# Patient Record
Sex: Female | Born: 1940 | Race: Black or African American | Hispanic: No | Smoking: Former smoker
Health system: Southern US, Community
[De-identification: ages and names within clinical notes are randomized; demographics above are authoritative.]

## PROBLEM LIST (undated history)

## (undated) DIAGNOSIS — E119 Type 2 diabetes mellitus without complications: Secondary | ICD-10-CM

## (undated) DIAGNOSIS — I10 Essential (primary) hypertension: Secondary | ICD-10-CM

## (undated) DIAGNOSIS — Z5189 Encounter for other specified aftercare: Secondary | ICD-10-CM

## (undated) DIAGNOSIS — D649 Anemia, unspecified: Secondary | ICD-10-CM

## (undated) DIAGNOSIS — E785 Hyperlipidemia, unspecified: Secondary | ICD-10-CM

## (undated) DIAGNOSIS — Z972 Presence of dental prosthetic device (complete) (partial): Secondary | ICD-10-CM

## (undated) DIAGNOSIS — M109 Gout, unspecified: Secondary | ICD-10-CM

## (undated) HISTORY — PX: TONSILLECTOMY: SUR1361

## (undated) HISTORY — DX: Hyperlipidemia, unspecified: E78.5

## (undated) HISTORY — PX: APPENDECTOMY: SHX54

## (undated) MED FILL — Fosaprepitant Dimeglumine For IV Infusion 150 MG (Base Eq): INTRAVENOUS | Qty: 5 | Status: AC

---

## 2004-02-14 ENCOUNTER — Emergency Department: Payer: Self-pay | Admitting: Emergency Medicine

## 2004-02-19 ENCOUNTER — Ambulatory Visit: Payer: Self-pay | Admitting: Internal Medicine

## 2004-12-14 ENCOUNTER — Ambulatory Visit: Payer: Self-pay | Admitting: Family Medicine

## 2006-10-09 ENCOUNTER — Ambulatory Visit: Payer: Self-pay | Admitting: Internal Medicine

## 2008-11-07 ENCOUNTER — Ambulatory Visit: Payer: Self-pay | Admitting: Family Medicine

## 2008-11-27 ENCOUNTER — Ambulatory Visit: Payer: Self-pay | Admitting: Internal Medicine

## 2008-11-27 ENCOUNTER — Inpatient Hospital Stay: Payer: Self-pay | Admitting: Internal Medicine

## 2009-06-12 HISTORY — PX: OTHER SURGICAL HISTORY: SHX169

## 2009-06-19 ENCOUNTER — Emergency Department: Payer: Self-pay | Admitting: Emergency Medicine

## 2009-06-20 ENCOUNTER — Inpatient Hospital Stay: Payer: Self-pay | Admitting: Surgery

## 2009-11-10 ENCOUNTER — Ambulatory Visit: Payer: Self-pay | Admitting: Family Medicine

## 2010-08-26 ENCOUNTER — Ambulatory Visit: Payer: Self-pay | Admitting: Ophthalmology

## 2010-11-19 ENCOUNTER — Ambulatory Visit: Payer: Self-pay | Admitting: Family Medicine

## 2010-12-19 ENCOUNTER — Emergency Department: Payer: Self-pay | Admitting: Emergency Medicine

## 2011-02-05 ENCOUNTER — Ambulatory Visit: Payer: Self-pay

## 2011-05-05 ENCOUNTER — Ambulatory Visit: Payer: Self-pay | Admitting: Ophthalmology

## 2011-11-23 ENCOUNTER — Ambulatory Visit: Payer: Self-pay | Admitting: Family Medicine

## 2012-11-23 ENCOUNTER — Ambulatory Visit: Payer: Self-pay | Admitting: Family Medicine

## 2013-09-23 ENCOUNTER — Emergency Department: Payer: Self-pay | Admitting: Internal Medicine

## 2013-12-03 ENCOUNTER — Ambulatory Visit: Payer: Self-pay | Admitting: Family Medicine

## 2013-12-03 LAB — LIPID PANEL
Cholesterol: 154 mg/dL (ref 0–200)
HDL: 51 mg/dL (ref 35–70)
LDL Cholesterol: 85 mg/dL
Triglycerides: 88 mg/dL (ref 40–160)

## 2013-12-03 LAB — HM MAMMOGRAPHY: HM Mammogram: NORMAL

## 2014-04-03 DIAGNOSIS — Z021 Encounter for pre-employment examination: Secondary | ICD-10-CM | POA: Diagnosis not present

## 2014-04-03 DIAGNOSIS — E119 Type 2 diabetes mellitus without complications: Secondary | ICD-10-CM | POA: Diagnosis not present

## 2014-04-03 DIAGNOSIS — I10 Essential (primary) hypertension: Secondary | ICD-10-CM | POA: Diagnosis not present

## 2014-04-03 LAB — BASIC METABOLIC PANEL
BUN: 11 mg/dL (ref 4–21)
Creatinine: 0.9 mg/dL (ref <=1.1)
Glucose: 256 mg/dL

## 2014-05-28 DIAGNOSIS — E119 Type 2 diabetes mellitus without complications: Secondary | ICD-10-CM | POA: Diagnosis not present

## 2014-05-28 LAB — HEMOGLOBIN A1C: Hgb A1c MFr Bld: 8.6 % — AB (ref 4.0–6.0)

## 2014-06-20 DIAGNOSIS — E1169 Type 2 diabetes mellitus with other specified complication: Secondary | ICD-10-CM | POA: Insufficient documentation

## 2014-06-20 DIAGNOSIS — E669 Obesity, unspecified: Secondary | ICD-10-CM | POA: Insufficient documentation

## 2014-06-20 DIAGNOSIS — E785 Hyperlipidemia, unspecified: Secondary | ICD-10-CM | POA: Insufficient documentation

## 2014-06-20 DIAGNOSIS — Z025 Encounter for examination for participation in sport: Secondary | ICD-10-CM | POA: Insufficient documentation

## 2014-06-20 DIAGNOSIS — Z0289 Encounter for other administrative examinations: Secondary | ICD-10-CM | POA: Insufficient documentation

## 2014-06-20 DIAGNOSIS — E119 Type 2 diabetes mellitus without complications: Secondary | ICD-10-CM | POA: Insufficient documentation

## 2014-06-20 DIAGNOSIS — E7849 Other hyperlipidemia: Secondary | ICD-10-CM | POA: Insufficient documentation

## 2014-06-20 DIAGNOSIS — I10 Essential (primary) hypertension: Secondary | ICD-10-CM | POA: Insufficient documentation

## 2014-07-19 ENCOUNTER — Emergency Department
Admission: EM | Admit: 2014-07-19 | Discharge: 2014-07-19 | Disposition: A | Discharge status: Home / Self Care | Payer: Commercial Managed Care - HMO | Attending: Emergency Medicine | Admitting: Emergency Medicine

## 2014-07-19 ENCOUNTER — Emergency Department: Payer: Commercial Managed Care - HMO

## 2014-07-19 ENCOUNTER — Encounter: Payer: Self-pay | Admitting: Emergency Medicine

## 2014-07-19 DIAGNOSIS — M1712 Unilateral primary osteoarthritis, left knee: Secondary | ICD-10-CM | POA: Insufficient documentation

## 2014-07-19 DIAGNOSIS — Z79899 Other long term (current) drug therapy: Secondary | ICD-10-CM | POA: Insufficient documentation

## 2014-07-19 DIAGNOSIS — Z87891 Personal history of nicotine dependence: Secondary | ICD-10-CM | POA: Insufficient documentation

## 2014-07-19 DIAGNOSIS — E119 Type 2 diabetes mellitus without complications: Secondary | ICD-10-CM | POA: Insufficient documentation

## 2014-07-19 DIAGNOSIS — I10 Essential (primary) hypertension: Secondary | ICD-10-CM | POA: Diagnosis not present

## 2014-07-19 DIAGNOSIS — M25562 Pain in left knee: Secondary | ICD-10-CM | POA: Diagnosis present

## 2014-07-19 HISTORY — DX: Type 2 diabetes mellitus without complications: E11.9

## 2014-07-19 HISTORY — DX: Essential (primary) hypertension: I10

## 2014-07-19 MED ORDER — KETOROLAC TROMETHAMINE 30 MG/ML IJ SOLN
30.0000 mg | Freq: Once | INTRAMUSCULAR | Status: AC
  Administered 2014-07-19: 30 mg via INTRAMUSCULAR

## 2014-07-19 MED ORDER — KETOROLAC TROMETHAMINE 30 MG/ML IJ SOLN
INTRAMUSCULAR | Status: AC
  Administered 2014-07-19: 30 mg via INTRAMUSCULAR
  Filled 2014-07-19: qty 1

## 2014-07-19 MED ORDER — NAPROXEN SODIUM 550 MG PO TABS: 550.0000 mg | ORAL_TABLET | Freq: Two times a day (BID) | ORAL | Status: DC

## 2014-07-19 NOTE — Discharge Instructions (Signed)

## 2014-07-19 NOTE — ED Notes (Signed)
Pt alert and oriented X4, active, cooperative, pt in NAD. RR even and unlabored, color WNL.  Pt informed to return if any life threatening symptoms occur.   

## 2014-07-19 NOTE — ED Notes (Addendum)
Pts airway intact , color wnl and appears in no distress. Pt states that she has had chronic left  knee pain and it comes and goes. She said that she did not hit the knee anywhere. Upon inspection the left knee looks slightly swollen. There is some pain on palpation ,  states that it feel like it is sore. Range of motion slightly compromised and there is pain on flexion. Able to bear weight with some pain.

## 2014-07-19 NOTE — ED Notes (Signed)
Patient transported to X-ray 

## 2014-07-19 NOTE — ED Provider Notes (Signed)
[facility] Emergency Department Provider Note  ____________________________________________  Time seen: Approximately 8:08 AM  I have reviewed the triage vital signs and the nursing notes.   HISTORY  Chief Complaint Knee Pain   HPI April [name] is a 74 y.o. female who presents to the emergency room for evaluation of left knee pain. Patient states the pain is progressively getting worse over the past 30 days. She denies any known trauma at this time. She states she is able to ambulate but with difficulty.   Past Medical History  Diagnosis Date  . Hypertension   . Diabetes mellitus without complication     Patient Active Problem List   Diagnosis Date Noted  . Diabetes mellitus with no complication [id number]  . 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 Essential (primary) hypertension 06/20/2014  . Routine sports examination 06/20/2014  . Health examination of defined subpopulation 06/20/2014    Past Surgical History  Procedure Laterality Date  . Gallstones  06/12/2009  . Appendectomy      Current Outpatient Rx  Name  Route  Sig  Dispense  Refill  . aspirin 81 MG tablet   Oral   Take 1 tablet by mouth daily.         Marland Kitchen diltiazem (DILACOR XR) 240 MG 24 hr capsule   Oral   Take 1 capsule by mouth daily.         Marland Kitchen glipiZIDE (GLUCOTROL) 5 MG tablet   Oral   Take 1 tablet by mouth 2 (two) times daily.         Marland Kitchen glucose blood test strip      1 strip daily.         Marland Kitchen lisinopril-hydrochlorothiazide (PRINZIDE,ZESTORETIC) 20-25 MG per tablet   Oral   Take 1 tablet by mouth daily.         . metFORMIN (GLUCOPHAGE) 500 MG tablet   Oral   Take 1 tablet by mouth 2 (two) times daily.         . naproxen sodium (ANAPROX) 550 MG tablet   Oral   Take 1 tablet (550 mg total) by mouth 2 (two) times daily with a meal.   60 tablet   0   . simvastatin (ZOCOR) 20 MG tablet   Oral    Take 1 tablet by mouth daily.           Allergies Review of patient's allergies indicates no known allergies.  History reviewed. No pertinent family history.  Social History History  Substance Use Topics  . Smoking status: Former Research scientist (life sciences)  . Smokeless tobacco: Not on file  . Alcohol Use: No    Review of Systems Constitutional: No fever/chills Eyes: No visual changes. ENT: No sore throat. Cardiovascular: Denies chest pain. Respiratory: Denies shortness of breath. Gastrointestinal: No abdominal pain.  No nausea, no vomiting.  No diarrhea.  No constipation. Genitourinary: Negative for dysuria. Musculoskeletal: Positive for right knee pain. Increased pain with flexion. Reflexes equal bilaterally lower extremities. Neurovascularly intact. Skin: Negative for rash. Neurological: Negative for headaches, focal weakness or numbness.  10-point ROS otherwise negative.  ____________________________________________   PHYSICAL EXAM:  VITAL SIGNS: ED Triage Vitals  Enc Vitals Group     BP 07/19/14 0756 147/58 mmHg     Pulse Rate 07/19/14 0756 67     Resp 07/19/14 0756 18     Temp 07/19/14 0756 98 F (36.7 C)     Temp Source 07/19/14 0756 Oral  SpO2 07/19/14 0756 97 %     Weight 07/19/14 0756 180 lb (81.647 kg)     Height 07/19/14 0756 5\' 6"  (1.676 m)     Head Cir --      Peak Flow --      Pain Score 07/19/14 0756 10     Pain Loc --      Pain Edu? --      Excl. in [place]? --     Constitutional: Alert and oriented. Well appearing and in no acute distress. Eyes: Conjunctivae are normal. PERRL. EOMI. Head: Atraumatic. Nose: No congestion/rhinnorhea. Mouth/Throat: Mucous membranes are moist.  Oropharynx non-erythematous. Neck: No stridor.   Cardiovascular: Normal rate, regular rhythm. Grossly normal heart sounds.  Good peripheral circulation. Respiratory: Normal respiratory effort.  No retractions. Lungs CTAB. Gastrointestinal: Soft and nontender. No distention. No abdominal  bruits. No CVA tenderness. Musculoskeletal: No lower extremity tenderness nor edema.  No joint effusions.Increased pain with flexion. Reflexes equal bilaterally lower extremities. Neurovascularly intact. Neurologic:  Normal speech and language. No gross focal neurologic deficits are appreciated. Speech is normal. No gait instability. Skin:  Skin is warm, dry and intact. No rash noted. Psychiatric: Mood and affect are normal. Speech and behavior are normal.  ____________________________________________   LABS (all labs ordered are listed, but only abnormal results are displayed)  Labs Reviewed - No data to display ____________________________________________  EKG  Deferred ____________________________________________  RADIOLOGY  Interpreted by radiologist, reviewed by myself. Negative for fracture. Positive for severe osteoarthritis and tricompartmental syndrome. ____________________________________________   PROCEDURES  Procedure(s) performed: None  Critical Care performed: No  ____________________________________________   INITIAL IMPRESSION / ASSESSMENT AND PLAN / ED COURSE  Pertinent labs & imaging results that were available during my care of the patient were reviewed by me and considered in my medical decision making (see chart for details).  Discussed clinical findings and radiology reviewed results with the patient. Diagnosed with osteoarthritis of the left knee. Patient states improvement from the pain from 10 down to 6 after the ketorolac injection. Rx given for Naprosyn twice a day. She is to follow-up with her PCP for referral to orthopedics. She voices no other emergency medical complaints at this time. ____________________________________________   FINAL CLINICAL IMPRESSION(S) / ED DIAGNOSES  Final diagnoses:  Primary osteoarthritis of left knee      Arlyss Repress, PA-C 07/19/14 2229  Delman Kitten, MD 07/21/14 1512

## 2014-07-19 NOTE — ED Notes (Signed)
Pt to ed with c/o left knee pain that started about 1 week ago.  Pt states she has not been to see PMD DR. Ronnald Ramp about the knee.  Denies injury,  Pt ambulates with limp.

## 2014-08-01 DIAGNOSIS — M1712 Unilateral primary osteoarthritis, left knee: Secondary | ICD-10-CM | POA: Diagnosis not present

## 2014-09-26 ENCOUNTER — Other Ambulatory Visit: Payer: Self-pay

## 2014-09-26 DIAGNOSIS — E119 Type 2 diabetes mellitus without complications: Secondary | ICD-10-CM

## 2014-09-26 DIAGNOSIS — I10 Essential (primary) hypertension: Secondary | ICD-10-CM

## 2014-09-26 DIAGNOSIS — E785 Hyperlipidemia, unspecified: Secondary | ICD-10-CM

## 2014-09-26 MED ORDER — GLIPIZIDE 5 MG PO TABS: 5.0000 mg | ORAL_TABLET | Freq: Two times a day (BID) | ORAL | Status: DC

## 2014-09-26 MED ORDER — METFORMIN HCL 500 MG PO TABS: 500.0000 mg | ORAL_TABLET | Freq: Two times a day (BID) | ORAL | Status: DC

## 2014-09-26 MED ORDER — SIMVASTATIN 20 MG PO TABS: 20.0000 mg | ORAL_TABLET | Freq: Every day | ORAL | Status: DC

## 2014-09-26 MED ORDER — DILTIAZEM HCL ER 240 MG PO CP24: 240.0000 mg | ORAL_CAPSULE | Freq: Every day | ORAL | Status: DC

## 2014-09-26 MED ORDER — LISINOPRIL-HYDROCHLOROTHIAZIDE 20-25 MG PO TABS: 1.0000 | ORAL_TABLET | Freq: Every day | ORAL | Status: DC

## 2014-10-09 ENCOUNTER — Other Ambulatory Visit: Payer: Self-pay

## 2014-11-13 ENCOUNTER — Other Ambulatory Visit: Payer: Self-pay

## 2014-11-13 DIAGNOSIS — Z1239 Encounter for other screening for malignant neoplasm of breast: Secondary | ICD-10-CM

## 2014-12-05 ENCOUNTER — Ambulatory Visit
Admission: RE | Admit: 2014-12-05 | Discharge: 2014-12-05 | Disposition: A | Discharge status: Home / Self Care | Payer: Commercial Managed Care - HMO | Source: Ambulatory Visit | Attending: Family Medicine | Admitting: Family Medicine

## 2014-12-05 DIAGNOSIS — Z1231 Encounter for screening mammogram for malignant neoplasm of breast: Secondary | ICD-10-CM | POA: Diagnosis not present

## 2014-12-05 DIAGNOSIS — Z1239 Encounter for other screening for malignant neoplasm of breast: Secondary | ICD-10-CM

## 2014-12-06 ENCOUNTER — Other Ambulatory Visit: Payer: Self-pay

## 2014-12-06 DIAGNOSIS — M25569 Pain in unspecified knee: Secondary | ICD-10-CM

## 2014-12-11 DIAGNOSIS — M1712 Unilateral primary osteoarthritis, left knee: Secondary | ICD-10-CM | POA: Diagnosis not present

## 2014-12-12 ENCOUNTER — Other Ambulatory Visit: Payer: Self-pay | Admitting: Family Medicine

## 2014-12-23 ENCOUNTER — Other Ambulatory Visit: Payer: Self-pay

## 2014-12-23 DIAGNOSIS — I10 Essential (primary) hypertension: Secondary | ICD-10-CM

## 2014-12-23 DIAGNOSIS — E119 Type 2 diabetes mellitus without complications: Secondary | ICD-10-CM

## 2014-12-23 DIAGNOSIS — E785 Hyperlipidemia, unspecified: Secondary | ICD-10-CM

## 2014-12-23 MED ORDER — DILTIAZEM HCL ER 240 MG PO CP24: 240.0000 mg | ORAL_CAPSULE | Freq: Every day | ORAL | Status: DC

## 2014-12-23 MED ORDER — GLIPIZIDE 5 MG PO TABS: 5.0000 mg | ORAL_TABLET | Freq: Two times a day (BID) | ORAL | Status: DC

## 2014-12-23 MED ORDER — LISINOPRIL-HYDROCHLOROTHIAZIDE 20-25 MG PO TABS: 1.0000 | ORAL_TABLET | Freq: Every day | ORAL | Status: DC

## 2014-12-23 MED ORDER — METFORMIN HCL 500 MG PO TABS: 500.0000 mg | ORAL_TABLET | Freq: Two times a day (BID) | ORAL | Status: DC

## 2014-12-23 MED ORDER — SIMVASTATIN 20 MG PO TABS: 20.0000 mg | ORAL_TABLET | Freq: Every day | ORAL | Status: DC

## 2015-01-06 ENCOUNTER — Ambulatory Visit (INDEPENDENT_AMBULATORY_CARE_PROVIDER_SITE_OTHER): Payer: Commercial Managed Care - HMO | Admitting: Family Medicine

## 2015-01-06 ENCOUNTER — Encounter: Payer: Self-pay | Admitting: Family Medicine

## 2015-01-06 VITALS — BP 120/80 | HR 72 | Ht 66.0 in | Wt 186.0 lb

## 2015-01-06 DIAGNOSIS — I10 Essential (primary) hypertension: Secondary | ICD-10-CM

## 2015-01-06 DIAGNOSIS — E784 Other hyperlipidemia: Secondary | ICD-10-CM | POA: Diagnosis not present

## 2015-01-06 DIAGNOSIS — E119 Type 2 diabetes mellitus without complications: Secondary | ICD-10-CM

## 2015-01-06 DIAGNOSIS — E785 Hyperlipidemia, unspecified: Secondary | ICD-10-CM

## 2015-01-06 DIAGNOSIS — Z0000 Encounter for general adult medical examination without abnormal findings: Secondary | ICD-10-CM | POA: Diagnosis not present

## 2015-01-06 DIAGNOSIS — E7849 Other hyperlipidemia: Secondary | ICD-10-CM

## 2015-01-06 MED ORDER — LISINOPRIL-HYDROCHLOROTHIAZIDE 20-25 MG PO TABS: 1.0000 | ORAL_TABLET | Freq: Every day | ORAL | Status: DC

## 2015-01-06 MED ORDER — SIMVASTATIN 20 MG PO TABS: 20.0000 mg | ORAL_TABLET | Freq: Every day | ORAL | Status: DC

## 2015-01-06 MED ORDER — METFORMIN HCL 500 MG PO TABS: 500.0000 mg | ORAL_TABLET | Freq: Two times a day (BID) | ORAL | Status: DC

## 2015-01-06 MED ORDER — DILTIAZEM HCL ER 240 MG PO CP24
240.0000 mg | ORAL_CAPSULE | Freq: Every day | ORAL | Status: DC
Start: 2015-01-06 — End: 2015-05-19

## 2015-01-06 MED ORDER — GLIPIZIDE 5 MG PO TABS: 5.0000 mg | ORAL_TABLET | Freq: Two times a day (BID) | ORAL | Status: DC

## 2015-01-06 NOTE — Progress Notes (Signed)
Patient: April [name], Female    DOB: October 04, 1940, 74 y.o.   MRN: [id number] Visit Date: 01/06/2015  Today's Provider: Otilio Miu, MD   Chief Complaint  Patient presents with  . Annual Exam    medicare annual wellness   Subjective:   Initial preventative physical exam April [name] is a 74 y.o. female who presents today for her Initial Preventative Physical Exam. She feels well. She reports exercising intermitantly. She reports she is sleeping well.  Hypertension This is a chronic problem. The current episode started more than 1 year ago. The problem has been waxing and waning since onset. The problem is controlled. Pertinent negatives include no anxiety, blurred vision, chest pain, headaches, malaise/fatigue, neck pain, orthopnea, palpitations, peripheral edema, PND, shortness of breath or sweats. There are no associated agents to hypertension. Risk factors for coronary artery disease include diabetes mellitus, dyslipidemia and obesity. Past treatments include calcium channel blockers, ACE inhibitors and diuretics. The current treatment provides moderate improvement. There are no compliance problems.  There is no history of angina, kidney disease, CAD/MI, CVA, heart failure, left ventricular hypertrophy, PVD, renovascular disease or retinopathy. There is no history of chronic renal disease or a hypertension causing med.  Diabetes She presents for her follow-up diabetic visit. She has type 2 diabetes mellitus. There are no hypoglycemic associated symptoms. Pertinent negatives for hypoglycemia include no confusion, dizziness, headaches, mood changes, nervousness/anxiousness, pallor, seizures, speech difficulty, sweats or tremors. Pertinent negatives for diabetes include no blurred vision, no chest pain, no fatigue, no polydipsia, no polyphagia, no polyuria and no weakness. There are no hypoglycemic complications. Pertinent negatives for diabetic complications include no autonomic neuropathy, CVA,  heart disease, impotence, nephropathy, peripheral neuropathy, PVD or retinopathy. Risk factors for coronary artery disease include hypertension, diabetes mellitus and dyslipidemia. Current diabetic treatment includes oral agent (dual therapy). Her weight is stable. She is following a generally healthy diet. She has not had a previous visit with a dietitian. She participates in exercise intermittently. Her breakfast blood glucose is taken between 7-8 am. Her breakfast blood glucose range is generally 90-110 mg/dl. An ACE inhibitor/angiotensin II receptor blocker is being taken. She does not see a podiatrist.Eye exam is current ([place]).  Hyperlipidemia This is a chronic problem. The current episode started more than 1 year ago. The problem is controlled. Recent lipid tests were reviewed and are variable. She has no history of chronic renal disease, diabetes, hypothyroidism, liver disease, obesity or nephrotic syndrome. Pertinent negatives include no chest pain, focal sensory loss, focal weakness, leg pain or shortness of breath. The current treatment provides no improvement of lipids. There are no compliance problems.  Risk factors for coronary artery disease include diabetes mellitus, dyslipidemia, hypertension and obesity.    Review of Systems  Constitutional: Negative for fever, chills, malaise/fatigue, fatigue and unexpected weight change.  HENT: Negative for ear pain, hearing loss, nosebleeds, sneezing, sore throat and trouble swallowing.   Eyes: Negative for blurred vision, photophobia, pain, redness, itching and visual disturbance.  Respiratory: Negative for cough, chest tightness, shortness of breath and wheezing.   Cardiovascular: Negative for chest pain, palpitations, orthopnea, leg swelling and PND.  Gastrointestinal: Negative for nausea, vomiting, abdominal pain, diarrhea, constipation, blood in stool and rectal pain.  Endocrine: Negative for cold intolerance, heat intolerance, polydipsia,  polyphagia and polyuria.  Genitourinary: Negative for dysuria, hematuria, flank pain, impotence, vaginal bleeding, vaginal discharge, difficulty urinating, menstrual problem and pelvic pain.  Musculoskeletal: Negative for back pain, joint swelling, neck pain and neck stiffness.  Skin: Negative for color change, pallor and rash.  Allergic/Immunologic: Negative for immunocompromised state.  Neurological: Negative for dizziness, tremors, focal weakness, seizures, syncope, facial asymmetry, speech difficulty, weakness, light-headedness, numbness and headaches.  Hematological: Does not bruise/bleed easily.  Psychiatric/Behavioral: Negative for suicidal ideas, hallucinations, behavioral problems, confusion, sleep disturbance, self-injury and agitation. The patient is not nervous/anxious.     Social History   Social History  . Marital Status: Widowed    Spouse Name: N/A  . Number of Children: N/A  . Years of Education: N/A   Occupational History  . Not on file.   Social History Main Topics  . Smoking status: Former Research scientist (life sciences)  . Smokeless tobacco: Not on file  . Alcohol Use: No  . Drug Use: No  . Sexual Activity: Not Currently   Other Topics Concern  . Not on file   Social History Narrative    Patient Active Problem List   Diagnosis Date Noted  . Diabetes mellitus with no complication ([place]) [id number]  . 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 Essential (primary) hypertension 06/20/2014  . Routine sports examination 06/20/2014  . Health examination of defined subpopulation 06/20/2014    Past Surgical History  Procedure Laterality Date  . Gallstones  06/12/2009  . Appendectomy      Her family history is not on file.    Previous Medications   ASPIRIN 81 MG TABLET    Take 1 tablet by mouth daily.   FREESTYLE LITE TEST STRIP    USE TO TEST DAILY   NAPROXEN SODIUM (ANAPROX) 550 MG TABLET    Take 1 tablet (550 mg total) by  mouth 2 (two) times daily with a meal.    Patient Care Team: Juline Patch, MD as PCP - General (Family Medicine)     Objective:   Vitals: BP 120/80 mmHg  Pulse 72  Ht 5\' 6"  (1.676 m)  Wt 186 lb (84.369 kg)  BMI 30.04 kg/m2  Physical Exam  Constitutional: She is oriented to person, place, and time. She appears well-developed and well-nourished.  HENT:  Head: Normocephalic.  Right Ear: External ear normal.  Left Ear: External ear normal.  Nose: Nose normal.  Eyes: Conjunctivae and EOM are normal. Pupils are equal, round, and reactive to light.  Neck: Normal range of motion. Neck supple.  Cardiovascular: Normal rate, regular rhythm, normal heart sounds and intact distal pulses.   Pulmonary/Chest: Effort normal and breath sounds normal. No respiratory distress. She has no wheezes. She has no rales.  Abdominal: Soft. Bowel sounds are normal. She exhibits no mass. There is no tenderness. There is no rebound and no guarding.  Genitourinary: Vagina normal and uterus normal.  Musculoskeletal: Normal range of motion. She exhibits no edema.  Neurological: She is alert and oriented to person, place, and time. She has normal reflexes.  Skin: Skin is warm and dry.  Psychiatric: She has a normal mood and affect. Her behavior is normal. Judgment and thought content normal.     No exam data present  Activities of Daily Living No flowsheet data found.  Fall Risk Assessment No flowsheet data found.   Patient reports there are not safety devices in place in shower at home. Smoke alarms in place.  Depression Screen No flowsheet data found.  Cognitive Testing - 6-CIT   Correct? Score   What year is it? yes 0 Yes = 0    No = 4  What month is it? yes 0 Yes =  0    No = 3  Remember:     Pia Mau, [address], [place]     What time is it? yes 0 Yes = 0    No = 3  Count backwards from 20 to 1 yes 0 Correct = 0    1 error = 2   More than 1 error = 4  Say the months of the year in  reverse. yes 0 Correct = 0    1 error = 2   More than 1 error = 4  What address did I ask you to remember? no 2 Correct = 0  1 error = 2    2 error = 4    3 error = 6    4 error = 8    All wrong = 10       TOTAL SCORE  0/28   Interpretation:  Normal  Normal (0-7) Abnormal (8-28)     Assessment & Plan:     Initial Preventative Physical Exam  Reviewed patient's Family Medical History Reviewed and updated list of patient's medical providers Assessment of cognitive impairment was done Assessed patient's functional ability Established a written schedule for health screening [place] Completed and Reviewed  Exercise Activities and Dietary recommendations Goals    None       There is no immunization history on file for this patient.  Health Maintenance  Topic Date Due  . FOOT EXAM  03/13/1949  . OPHTHALMOLOGY EXAM  03/13/1949  . URINE MICROALBUMIN  03/13/1949  . TETANUS/TDAP  03/13/1958  . COLONOSCOPY  03/13/1989  . ZOSTAVAX  03/14/1999  . DEXA SCAN  03/13/2004  . PNA vac Low Risk Adult (1 of 2 - PCV13) 03/13/2004  . INFLUENZA VACCINE  09/09/2014  . HEMOGLOBIN A1C  11/27/2014      Discussed health benefits of physical activity, and encouraged her to engage in regular exercise appropriate for her age and condition.    ------------------------------------------------------------------------------------------------------------   Problem List Items Addressed This Visit      Cardiovascular and Mediastinum   Essential (primary) hypertension   Relevant Medications   diltiazem (DILACOR XR) 240 MG 24 hr capsule   lisinopril-hydrochlorothiazide (PRINZIDE,ZESTORETIC) 20-25 MG tablet   simvastatin (ZOCOR) 20 MG tablet   Other Relevant Orders   Renal Function Panel     Endocrine   Diabetes mellitus with no complication (HCC)   Relevant Medications   glipiZIDE (GLUCOTROL) 5 MG tablet   lisinopril-hydrochlorothiazide (PRINZIDE,ZESTORETIC) 20-25 MG  tablet   metFORMIN (GLUCOPHAGE) 500 MG tablet   simvastatin (ZOCOR) 20 MG tablet   Other Relevant Orders   HgB A1c   Renal Function Panel   Urine Microalbumin w/creat. ratio     Other   Familial multiple lipoprotein-type hyperlipidemia   Relevant Medications   diltiazem (DILACOR XR) 240 MG 24 hr capsule   lisinopril-hydrochlorothiazide (PRINZIDE,ZESTORETIC) 20-25 MG tablet   simvastatin (ZOCOR) 20 MG tablet   Other Relevant Orders   Lipid Profile    Other Visit Diagnoses    Medicare annual wellness visit, subsequent    -  Primary    Essential hypertension        Relevant Medications    diltiazem (DILACOR XR) 240 MG 24 hr capsule    lisinopril-hydrochlorothiazide (PRINZIDE,ZESTORETIC) 20-25 MG tablet    simvastatin (ZOCOR) 20 MG tablet    Type 2 diabetes mellitus without complication, without long-term current use of insulin (HCC)  Relevant Medications    glipiZIDE (GLUCOTROL) 5 MG tablet    lisinopril-hydrochlorothiazide (PRINZIDE,ZESTORETIC) 20-25 MG tablet    metFORMIN (GLUCOPHAGE) 500 MG tablet    simvastatin (ZOCOR) 20 MG tablet    Hyperlipidemia        Relevant Medications    diltiazem (DILACOR XR) 240 MG 24 hr capsule    lisinopril-hydrochlorothiazide (PRINZIDE,ZESTORETIC) 20-25 MG tablet    simvastatin (ZOCOR) 20 MG tablet        Otilio Miu, MD [facility]  01/06/2015

## 2015-01-07 LAB — LIPID PANEL
Chol/HDL Ratio: 3 ratio units (ref 0.0–4.4)
Cholesterol, Total: 174 mg/dL (ref 100–199)
HDL: 58 mg/dL (ref >39)
LDL Calculated: 92 mg/dL (ref 0–99)
Triglycerides: 121 mg/dL (ref 0–149)
VLDL Cholesterol Cal: 24 mg/dL (ref 5–40)

## 2015-01-07 LAB — MICROALBUMIN / CREATININE URINE RATIO
Creatinine, Urine: 32 mg/dL
MICROALB/CREAT RATIO: 9.7 mg/g creat (ref 0.0–30.0)
Microalbumin, Urine: 3.1 ug/mL

## 2015-01-07 LAB — PLEASE NOTE

## 2015-01-07 LAB — HEMOGLOBIN A1C: Hgb A1c MFr Bld: 8.3 % — ABNORMAL HIGH (ref 4.8–5.6)

## 2015-01-07 LAB — RENAL FUNCTION PANEL: Phosphorus: 3.6 mg/dL (ref 2.5–4.5)

## 2015-01-07 NOTE — Addendum Note (Signed)
Addended by: Fredderick Severance on: 01/07/2015 12:33 PM   Modules accepted: Orders

## 2015-01-24 DIAGNOSIS — B351 Tinea unguium: Secondary | ICD-10-CM | POA: Diagnosis not present

## 2015-01-24 DIAGNOSIS — E119 Type 2 diabetes mellitus without complications: Secondary | ICD-10-CM | POA: Diagnosis not present

## 2015-02-14 ENCOUNTER — Other Ambulatory Visit: Payer: Self-pay

## 2015-02-18 ENCOUNTER — Encounter: Payer: Self-pay | Admitting: Emergency Medicine

## 2015-02-18 ENCOUNTER — Emergency Department: Payer: Commercial Managed Care - HMO

## 2015-02-18 ENCOUNTER — Emergency Department
Admission: EM | Admit: 2015-02-18 | Discharge: 2015-02-18 | Disposition: A | Discharge status: Home / Self Care | Payer: Commercial Managed Care - HMO | Attending: Emergency Medicine | Admitting: Emergency Medicine

## 2015-02-18 DIAGNOSIS — Z7984 Long term (current) use of oral hypoglycemic drugs: Secondary | ICD-10-CM | POA: Insufficient documentation

## 2015-02-18 DIAGNOSIS — I10 Essential (primary) hypertension: Secondary | ICD-10-CM | POA: Insufficient documentation

## 2015-02-18 DIAGNOSIS — Z791 Long term (current) use of non-steroidal anti-inflammatories (NSAID): Secondary | ICD-10-CM | POA: Insufficient documentation

## 2015-02-18 DIAGNOSIS — Y9289 Other specified places as the place of occurrence of the external cause: Secondary | ICD-10-CM | POA: Diagnosis not present

## 2015-02-18 DIAGNOSIS — Y9389 Activity, other specified: Secondary | ICD-10-CM | POA: Diagnosis not present

## 2015-02-18 DIAGNOSIS — Z79899 Other long term (current) drug therapy: Secondary | ICD-10-CM | POA: Insufficient documentation

## 2015-02-18 DIAGNOSIS — Z7982 Long term (current) use of aspirin: Secondary | ICD-10-CM | POA: Diagnosis not present

## 2015-02-18 DIAGNOSIS — M545 Low back pain: Secondary | ICD-10-CM | POA: Diagnosis not present

## 2015-02-18 DIAGNOSIS — M5136 Other intervertebral disc degeneration, lumbar region: Secondary | ICD-10-CM

## 2015-02-18 DIAGNOSIS — Z87891 Personal history of nicotine dependence: Secondary | ICD-10-CM | POA: Diagnosis not present

## 2015-02-18 DIAGNOSIS — E119 Type 2 diabetes mellitus without complications: Secondary | ICD-10-CM | POA: Insufficient documentation

## 2015-02-18 DIAGNOSIS — Y998 Other external cause status: Secondary | ICD-10-CM | POA: Insufficient documentation

## 2015-02-18 DIAGNOSIS — X501XXA Overexertion from prolonged static or awkward postures, initial encounter: Secondary | ICD-10-CM | POA: Diagnosis not present

## 2015-02-18 DIAGNOSIS — S3992XA Unspecified injury of lower back, initial encounter: Secondary | ICD-10-CM | POA: Diagnosis present

## 2015-02-18 MED ORDER — CYCLOBENZAPRINE HCL 10 MG PO TABS: 10.0000 mg | ORAL_TABLET | Freq: Every day | ORAL | Status: DC

## 2015-02-18 NOTE — ED Provider Notes (Signed)
CSN: [id number]     Arrival date & time 02/18/15  1243 History   First MD Initiated Contact with Patient 02/18/15 1320     Chief Complaint  Patient presents with  . Back Pain     HPI Comments: 75 year old female presents today complaining of lower back pain secondary to injury that occurred last Friday. Pt reports she twisted and since then has had pain in her bilateral lower back. No tingling or numbness down her legs. No loss of bowel or bladder function, genital or perianal numbness. No relief with OTC meds. Does not have a history of chronic back pain.   The history is provided by the patient.    Past Medical History  Diagnosis Date  . Hypertension   . Diabetes mellitus without complication [facility])    Past Surgical History  Procedure Laterality Date  . Gallstones  06/12/2009  . Appendectomy     History reviewed. No pertinent family history. Social History  Substance Use Topics  . Smoking status: Former Research scientist (life sciences)  . Smokeless tobacco: None  . Alcohol Use: No   OB History    Gravida Para Term Preterm AB TAB SAB Ectopic Multiple Living   1         1     Review of Systems  Musculoskeletal: Positive for myalgias, back pain and arthralgias.  Neurological: Negative for weakness and numbness.  All other systems reviewed and are negative.     Allergies  Review of patient's allergies indicates no known allergies.  Home Medications   Prior to Admission medications   Medication Sig Start Date End Date Taking? Authorizing Provider  aspirin 81 MG tablet Take 1 tablet by mouth daily.    Historical Provider, MD  cyclobenzaprine (FLEXERIL) 10 MG tablet Take 1 tablet (10 mg total) by mouth at bedtime. 02/18/15   Harvest Dark, PA-C  diltiazem (DILACOR XR) 240 MG 24 hr capsule Take 1 capsule (240 mg total) by mouth daily. 01/06/15   Juline Patch, MD  FREESTYLE LITE test strip USE TO TEST DAILY 12/12/14   Juline Patch, MD  glipiZIDE (GLUCOTROL) 5 MG tablet Take 1 tablet (5 mg  total) by mouth 2 (two) times daily. 01/06/15   Juline Patch, MD  lisinopril-hydrochlorothiazide (PRINZIDE,ZESTORETIC) 20-25 MG tablet Take 1 tablet by mouth daily. 01/06/15   Juline Patch, MD  metFORMIN (GLUCOPHAGE) 500 MG tablet Take 1 tablet (500 mg total) by mouth 2 (two) times daily. 01/06/15   Juline Patch, MD  naproxen sodium (ANAPROX) 550 MG tablet Take 1 tablet (550 mg total) by mouth 2 (two) times daily with a meal. 07/19/14   Pierce Crane Beers, PA-C  simvastatin (ZOCOR) 20 MG tablet Take 1 tablet (20 mg total) by mouth daily. 01/06/15   Juline Patch, MD   Pulse 82  Temp(Src) 98 F (36.7 C) (Oral)  Resp 18 Physical Exam  Constitutional: She is oriented to person, place, and time. She appears well-developed and well-nourished.  HENT:  Head: Normocephalic and atraumatic.  Musculoskeletal: Normal range of motion. She exhibits tenderness.       Right hip: Normal. She exhibits normal range of motion, normal strength and no tenderness.       Left hip: Normal. She exhibits normal range of motion, normal strength and no tenderness.       Lumbar back: She exhibits tenderness, bony tenderness and spasm.  Neurological: She is alert and oriented to person, place, and time. She exhibits  normal muscle tone.  Equal strength bilateral LE 5/5 Equal great toe raise bilaterally Equal DTRs 2+ bilateral patellar tendons.   Skin: Skin is warm and dry.  Psychiatric: She has a normal mood and affect. Her behavior is normal. Judgment and thought content normal.  Nursing note and vitals reviewed.   ED Course  Procedures (including critical care time) Labs Review Labs Reviewed - No data to display  Imaging Review Dg Lumbar Spine Complete  02/18/2015  CLINICAL DATA:  75 year old female with lumbar spine pain since Friday EXAM: [place] 4+ VIEW COMPARISON:  None. FINDINGS: No evidence of acute fracture or malalignment. The vertebral body heights are maintained. There is multilevel  degenerative disc disease as well as lower lumbar facet arthropathy at L4-L5 and L5-S1. Aortoiliac atherosclerotic vascular calcifications without evidence of aneurysm. Bony mineralization is within normal limits. No lytic or blastic osseous lesion. Surgical clips in the right upper quadrant suggest prior cholecystectomy. The visualized bowel gas pattern is unremarkable. IMPRESSION: 1. No evidence of acute fracture or malalignment. 2. Multilevel degenerative disc disease and lower lumbar facet arthropathy. 3. Aortoiliac atherosclerotic vascular calcifications. Electronically Signed   By: Jacqulynn Cadet M.D.   On: 02/18/2015 14:06   I have personally reviewed and evaluated these images and lab results as part of my medical decision-making.   EKG Interpretation None      MDM  Discussed results with patient Continue motrin or aleve over the counter as needed Add flexeril at bedtime Return for new or worsening symptoms, otherwise follow up with PCP  Final diagnoses:  Degenerative disc disease, lumbar        Harvest Dark, PA-C 02/18/15 1519  Harvest Dark, MD 02/18/15 [phone]

## 2015-02-18 NOTE — Discharge Instructions (Signed)
Degenerative Disk Disease  Degenerative disk disease is a condition caused by the changes that occur in spinal disks as you grow older. Spinal disks are soft and compressible disks located between the bones of your spine (vertebrae). These disks act like shock absorbers. Degenerative disk disease can affect the whole spine. However, the neck and lower back are most commonly affected. Many changes can occur in the spinal disks with aging, such as:  · The spinal disks may dry and shrink.  · Small tears may occur in the tough, outer covering of the disk (annulus).  · The disk space may become smaller due to loss of water.  · Abnormal growths in the bone (spurs) may occur. This can put pressure on the nerve roots exiting the spinal canal, causing pain.  · The spinal canal may become narrowed.  RISK FACTORS   · Being overweight.  · Having a family history of degenerative disk disease.  · Smoking.  · There is increased risk if you are doing heavy lifting or have a sudden injury.  SIGNS AND SYMPTOMS   Symptoms vary from person to person and may include:  · Pain that varies in intensity. Some people have no pain, while others have severe pain. The location of the pain depends on the part of your backbone that is affected.  ¨ You will have neck or arm pain if a disk in the neck area is affected.  ¨ You will have pain in your back, buttocks, or legs if a disk in the lower back is affected.  · Pain that becomes worse while bending, reaching up, or with twisting movements.  · Pain that may start gradually and then get worse as time passes. It may also start after a major or minor injury.  · Numbness or tingling in the arms or legs.  DIAGNOSIS   Your health care provider will ask you about your symptoms and about activities or habits that may cause the pain. He or she may also ask about any injuries, diseases, or treatments you have had. Your health care provider will examine you to check for the range of movement that is  possible in the affected area, to check for strength in your extremities, and to check for sensation in the areas of the arms and legs supplied by different nerve roots. You may also have:   · An X-ray of the spine.  · Other imaging tests, such as MRI.  TREATMENT   Your health care provider will advise you on the best plan for treatment. Treatment may include:  · Medicines.  · Rehabilitation exercises.  HOME CARE INSTRUCTIONS   · Follow proper lifting and walking techniques as advised by your health care provider.  · Maintain good posture.  · Exercise regularly as advised by your health care provider.  · Perform relaxation exercises.  · Change your sitting, standing, and sleeping habits as advised by your health care provider.  · Change positions frequently.  · Lose weight or maintain a healthy weight as advised by your health care provider.  · Do not use any tobacco products, including cigarettes, chewing tobacco, or electronic cigarettes. If you need help quitting, ask your health care provider.  · Wear supportive footwear.  · Take medicines only as directed by your health care provider.  SEEK MEDICAL CARE IF:   · Your pain does not go away within 1-4 weeks.  · You have significant appetite or weight loss.  SEEK IMMEDIATE MEDICAL CARE IF:   ·   Your pain is severe.  · You notice weakness in your arms, hands, or legs.  · You begin to lose control of your bladder or bowel movements.  · You have fevers or night sweats.  MAKE SURE YOU:   · Understand these instructions.  · Will watch your condition.  · Will get help right away if you are not doing well or get worse.     This information is not intended to replace advice given to you by your health care provider. Make sure you discuss any questions you have with your health care provider.     Document Released: 11/22/2006 Document Revised: 02/15/2014 Document Reviewed: 05/29/2013  Elsevier Interactive Patient Education ©2016 [employer]

## 2015-02-18 NOTE — ED Notes (Signed)
Pt to ed with c/o lower back pain that started on Friday after she twisted her back

## 2015-02-18 NOTE — ED Notes (Signed)
States she twisted her back last Friday   Having pain to lower back  Denies fall

## 2015-02-20 ENCOUNTER — Other Ambulatory Visit: Payer: Self-pay

## 2015-02-20 DIAGNOSIS — E119 Type 2 diabetes mellitus without complications: Secondary | ICD-10-CM

## 2015-03-07 DIAGNOSIS — E119 Type 2 diabetes mellitus without complications: Secondary | ICD-10-CM | POA: Diagnosis not present

## 2015-03-10 DIAGNOSIS — E11620 Type 2 diabetes mellitus with diabetic dermatitis: Secondary | ICD-10-CM | POA: Diagnosis not present

## 2015-03-10 DIAGNOSIS — D2371 Other benign neoplasm of skin of right lower limb, including hip: Secondary | ICD-10-CM | POA: Diagnosis not present

## 2015-03-10 DIAGNOSIS — B351 Tinea unguium: Secondary | ICD-10-CM | POA: Diagnosis not present

## 2015-03-12 ENCOUNTER — Other Ambulatory Visit: Payer: Self-pay | Admitting: Family Medicine

## 2015-04-01 ENCOUNTER — Other Ambulatory Visit: Payer: Self-pay | Admitting: Family Medicine

## 2015-05-17 ENCOUNTER — Emergency Department: Payer: Commercial Managed Care - HMO

## 2015-05-17 ENCOUNTER — Emergency Department
Admission: EM | Admit: 2015-05-17 | Discharge: 2015-05-17 | Disposition: A | Discharge status: Home / Self Care | Payer: Commercial Managed Care - HMO | Attending: Emergency Medicine | Admitting: Emergency Medicine

## 2015-05-17 DIAGNOSIS — I10 Essential (primary) hypertension: Secondary | ICD-10-CM | POA: Insufficient documentation

## 2015-05-17 DIAGNOSIS — M25562 Pain in left knee: Secondary | ICD-10-CM | POA: Diagnosis not present

## 2015-05-17 DIAGNOSIS — M25569 Pain in unspecified knee: Secondary | ICD-10-CM

## 2015-05-17 DIAGNOSIS — E119 Type 2 diabetes mellitus without complications: Secondary | ICD-10-CM | POA: Insufficient documentation

## 2015-05-17 DIAGNOSIS — Z7982 Long term (current) use of aspirin: Secondary | ICD-10-CM | POA: Diagnosis not present

## 2015-05-17 DIAGNOSIS — M1990 Unspecified osteoarthritis, unspecified site: Secondary | ICD-10-CM | POA: Diagnosis not present

## 2015-05-17 DIAGNOSIS — M13862 Other specified arthritis, left knee: Secondary | ICD-10-CM | POA: Diagnosis not present

## 2015-05-17 DIAGNOSIS — Z87891 Personal history of nicotine dependence: Secondary | ICD-10-CM | POA: Diagnosis not present

## 2015-05-17 DIAGNOSIS — M179 Osteoarthritis of knee, unspecified: Secondary | ICD-10-CM | POA: Diagnosis not present

## 2015-05-17 DIAGNOSIS — M1712 Unilateral primary osteoarthritis, left knee: Secondary | ICD-10-CM

## 2015-05-17 MED ORDER — ACETAMINOPHEN 325 MG PO TABS
650.0000 mg | ORAL_TABLET | Freq: Once | ORAL | Status: AC
  Administered 2015-05-17: 650 mg via ORAL
  Filled 2015-05-17: qty 2

## 2015-05-17 MED ORDER — NAPROXEN 500 MG PO TABS: 500.0000 mg | ORAL_TABLET | Freq: Two times a day (BID) | ORAL | Status: DC

## 2015-05-17 NOTE — ED Provider Notes (Signed)
[facility] Emergency Department Provider Note  ____________________________________________  Time seen: Approximately 8:52 AM  I have reviewed the triage vital signs and the nursing notes.   HISTORY  Chief Complaint Knee Pain    HPI April [name] is a 75 y.o. female, NAD, presents to the emergency department with left knee pain that began this morning at 1 am. She states it woke her up from sleep.  She states that the pain is located deep in her knee and is throbbing in nature.  Has not tried any medications to help relieve the pain. She has never had this pain before. Denies trauma to the knee, fall and other arthralgias. Has not noted any redness, swelling, warmth to the area. Denies any open wounds or skin lesions.   Past Medical History  Diagnosis Date  . Hypertension   . Diabetes mellitus without complication [facility])     Patient Active Problem List   Diagnosis Date Noted  . Diabetes mellitus with no complication ([place]) [id number]  . 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 Essential (primary) hypertension 06/20/2014  . Routine sports examination 06/20/2014  . Health examination of defined subpopulation 06/20/2014    Past Surgical History  Procedure Laterality Date  . Gallstones  06/12/2009  . Appendectomy      Current Outpatient Rx  Name  Route  Sig  Dispense  Refill  . aspirin 81 MG tablet   Oral   Take 1 tablet by mouth daily.         . cyclobenzaprine (FLEXERIL) 10 MG tablet   Oral   Take 1 tablet (10 mg total) by mouth at bedtime.   30 tablet   0   . diltiazem (DILACOR XR) 240 MG 24 hr capsule   Oral   Take 1 capsule (240 mg total) by mouth daily.   90 capsule   1   . FREESTYLE LITE test strip      USE AS DIRECTED TO TEST DAILY   50 each   2     E11.9   . glipiZIDE (GLUCOTROL) 5 MG tablet   Oral   Take 1 tablet (5 mg total) by mouth 2 (two) times daily.   180  tablet   1   . lisinopril-hydrochlorothiazide (PRINZIDE,ZESTORETIC) 20-25 MG tablet   Oral   Take 1 tablet by mouth daily.   90 tablet   1   . metFORMIN (GLUCOPHAGE) 500 MG tablet   Oral   Take 1 tablet (500 mg total) by mouth 2 (two) times daily.   180 tablet   1   . naproxen (NAPROSYN) 500 MG tablet   Oral   Take 1 tablet (500 mg total) by mouth 2 (two) times daily with a meal.   30 tablet   0   . simvastatin (ZOCOR) 20 MG tablet      TAKE 1 TABLET EVERY DAY   90 tablet   0     Allergies Review of patient's allergies indicates no known allergies.  No family history on file.  Social History Social History  Substance Use Topics  . Smoking status: Former Research scientist (life sciences)  . Smokeless tobacco: Not on file  . Alcohol Use: No     Review of Systems  Constitutional: No fever/chills, fatigue Cardiovascular: No chest pain. Respiratory:  No shortness of breath.  Musculoskeletal: Positive left knee pain. Negative for back, hip, ankle, foot pain.  Skin: Negative for rash or redness, swelling, skin sores  or bruising. Neurological: Negative for headaches, focal weakness or numbness. 10-point ROS otherwise negative.  ____________________________________________   PHYSICAL EXAM:  VITAL SIGNS: ED Triage Vitals  Enc Vitals Group     BP 05/17/15 0804 159/58 mmHg     Pulse Rate 05/17/15 0804 71     Resp 05/17/15 0804 20     Temp 05/17/15 0804 97.8 F (36.6 C)     Temp Source 05/17/15 0804 Oral     SpO2 05/17/15 0804 98 %     Weight 05/17/15 0804 189 lb (85.73 kg)     Height 05/17/15 0804 5\' 6"  (1.676 m)     Head Cir --      Peak Flow --      Pain Score 05/17/15 0805 10     Pain Loc --      Pain Edu? --      Excl. in [place]? --     Constitutional: Alert and oriented. Well appearing and in no acute distress. Patient seated comfortably in a hospital wheelchair. Head: Atraumatic. Cardiovascular: Good peripheral circulation. Respiratory: Normal respiratory effort without  tachypnea or retractions. Musculoskeletal: Mild medial edema about the left knee. Tender to palpation about the medial left knee. No pain to palpation of the patella. Her crepitus. No laxity with varus or valgus stress. Negative anterior and posterior drawer. No effusions. Neurologic:  Normal speech and language. No gross focal neurologic deficits are appreciated.  Skin:  Skin is warm, dry and intact. No rash, redness, swelling, bruising, skin sores noted. Psychiatric: Mood and affect are normal. Speech and behavior are normal. Patient exhibits appropriate insight and judgement.    LABS  None  ____________________________________________  RADIOLOGY I have personally viewed and evaluated these images (plain radiographs) as part of my medical decision making, as well as reviewing the written report by the radiologist.  Dg Knee Complete 4 Views Left  05/17/2015  CLINICAL DATA:  Sudden onset of left knee pain this morning. EXAM: LEFT KNEE - COMPLETE 4+ VIEW COMPARISON:  07/19/2014 FINDINGS: Again noted is severe medial joint space narrowing. Prominent osteophytes in the knee compartments. Probable joint effusion. Again noted are vascular calcifications. Negative for an acute fracture or dislocation. 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 No acute bone abnormality. Electronically Signed   By: Markus Daft M.D.   On: 05/17/2015 08:48     Medications  acetaminophen (TYLENOL) tablet 650 mg (650 mg Oral Given 05/17/15 0909)     ____________________________________________   INITIAL IMPRESSION / ASSESSMENT AND PLAN / ED COURSE  Pertinent imaging results that were available during my care of the patient were reviewed by me and considered in my medical decision making (see chart for details).  Patient's diagnosis is consistent with arthritis of the left knee causing arthralgia. Patient will be discharged home with prescriptions for naproxen for pain. Advised patient to apply  ice to the area 20 minutes 3-4 times daily as needed. Light range of motion as well as 20-30 minutes walking daily to help limit pain in the knee. Patient is to follow up with PCP if symptoms persist past this treatment course. Patient is given ED precautions to return to the ED for any worsening or new symptoms.    ____________________________________________  FINAL CLINICAL IMPRESSION(S) / ED DIAGNOSES  Final diagnoses:  Arthritis of left knee  Arthralgia of left knee      NEW MEDICATIONS STARTED DURING THIS VISIT:  Discharge Medication List as of 05/17/2015  9:07 AM  START taking these medications   Details  naproxen (NAPROSYN) 500 MG tablet Take 1 tablet (500 mg total) by mouth 2 (two) times daily with a meal., Starting 05/17/2015, Until Discontinued, [place], PA-C 05/17/15 [place], MD 05/17/15 [phone]

## 2015-05-17 NOTE — Discharge Instructions (Signed)
Arthritis Arthritis is a term that is commonly used to refer to joint pain or joint disease. There are more than 100 types of arthritis. CAUSES The most common cause of this condition is wear and tear of a joint. Other causes include:  Gout.  Inflammation of a joint.  An infection of a joint.  Sprains and other injuries near the joint.  A drug reaction or allergic reaction. In some cases, the cause may not be known. SYMPTOMS The main symptom of this condition is pain in the joint with movement. Other symptoms include:  Redness, swelling, or stiffness at a joint.  Warmth coming from the joint.  Fever.  Overall feeling of illness. DIAGNOSIS This condition may be diagnosed with a physical exam and tests, including:  Blood tests.  Urine tests.  Imaging tests, such as MRI, X-rays, or a CT scan. Sometimes, fluid is removed from a joint for testing. TREATMENT Treatment for this condition may involve:  Treatment of the cause, if it is known.  Rest.  Raising (elevating) the joint.  Applying cold or hot packs to the joint.  Medicines to improve symptoms and reduce inflammation.  Injections of a steroid such as cortisone into the joint to help reduce pain and inflammation. Depending on the cause of your arthritis, you may need to make lifestyle changes to reduce stress on your joint. These changes may include exercising more and losing weight. HOME CARE INSTRUCTIONS Medicines  Take over-the-counter and prescription medicines only as told by your health care provider.  Do not take aspirin to relieve pain if gout is suspected. Activities  Rest your joint if told by your health care provider. Rest is important when your disease is active and your joint feels painful, swollen, or stiff.  Avoid activities that make the pain worse. It is important to balance activity with rest.  Exercise your joint regularly with range-of-motion exercises as told by your health care  provider. Try doing low-impact exercise, such as:  Swimming.  Water aerobics.  Biking.  Walking. Joint Care  If your joint is swollen, keep it elevated if told by your health care provider.  If your joint feels stiff in the morning, try taking a warm shower.  If directed, apply heat to the joint. If you have diabetes, do not apply heat without permission from your health care provider.  Put a towel between the joint and the hot pack or heating pad.  Leave the heat on the area for 20-30 minutes.  If directed, apply ice to the joint:  Put ice in a plastic bag.  Place a towel between your skin and the bag.  Leave the ice on for 20 minutes, 2-3 times per day.  Keep all follow-up visits as told by your health care provider. This is important. SEEK MEDICAL CARE IF:  The pain gets worse.  You have a fever. SEEK IMMEDIATE MEDICAL CARE IF:  You develop severe joint pain, swelling, or redness.  Many joints become painful and swollen.  You develop severe back pain.  You develop severe weakness in your leg.  You cannot control your bladder or bowels.   This information is not intended to replace advice given to you by your health care provider. Make sure you discuss any questions you have with your health care provider.   Document Released: 03/04/2004 Document Revised: 10/16/2014 Document Reviewed: 04/22/2014 Elsevier Interactive Patient Education 2016 [place].  Cryotherapy Cryotherapy means treatment with cold. Ice or gel packs can be used to  reduce both pain and swelling. Ice is the most helpful within the first 24 to 48 hours after an injury or flare-up from overusing a muscle or joint. Sprains, strains, spasms, burning pain, shooting pain, and aches can all be eased with ice. Ice can also be used when recovering from surgery. Ice is effective, has very few side effects, and is safe for most people to use. PRECAUTIONS  Ice is not a safe treatment option for people  with:  Raynaud phenomenon. This is a condition affecting small blood vessels in the extremities. Exposure to cold may cause your problems to return.  Cold hypersensitivity. There are many forms of cold hypersensitivity, including:  Cold urticaria. Red, itchy hives appear on the skin when the tissues begin to warm after being iced.  Cold erythema. This is a red, itchy rash caused by exposure to cold.  Cold hemoglobinuria. Red blood cells break down when the tissues begin to warm after being iced. The hemoglobin that carry oxygen are passed into the urine because they cannot combine with blood proteins fast enough.  Numbness or altered sensitivity in the area being iced. If you have any of the following conditions, do not use ice until you have discussed cryotherapy with your caregiver:  Heart conditions, such as arrhythmia, angina, or chronic heart disease.  High blood pressure.  Healing wounds or open skin in the area being iced.  Current infections.  Rheumatoid arthritis.  Poor circulation.  Diabetes. Ice slows the blood flow in the region it is applied. This is beneficial when trying to stop inflamed tissues from spreading irritating chemicals to surrounding tissues. However, if you expose your skin to cold temperatures for too long or without the proper protection, you can damage your skin or nerves. Watch for signs of skin damage due to cold. HOME CARE INSTRUCTIONS Follow these tips to use ice and cold packs safely.  Place a dry or damp towel between the ice and skin. A damp towel will cool the skin more quickly, so you may need to shorten the time that the ice is used.  For a more rapid response, add gentle compression to the ice.  Ice for no more than 10 to 20 minutes at a time. The bonier the area you are icing, the less time it will take to get the benefits of ice.  Check your skin after 5 minutes to make sure there are no signs of a poor response to cold or skin  damage.  Rest 20 minutes or more between uses.  Once your skin is numb, you can end your treatment. You can test numbness by very lightly touching your skin. The touch should be so light that you do not see the skin dimple from the pressure of your fingertip. When using ice, most people will feel these normal sensations in this order: cold, burning, aching, and numbness.  Do not use ice on someone who cannot communicate their responses to pain, such as small children or people with dementia. HOW TO MAKE AN ICE PACK Ice packs are the most common way to use ice therapy. Other methods include ice massage, ice baths, and cryosprays. Muscle creams that cause a cold, tingly feeling do not offer the same benefits that ice offers and should not be used as a substitute unless recommended by your caregiver. To make an ice pack, do one of the following:  Place crushed ice or a bag of frozen vegetables in a sealable plastic bag. Squeeze out the excess  air. Place this bag inside another plastic bag. Slide the bag into a pillowcase or place a damp towel between your skin and the bag.  Mix 3 parts water with 1 part rubbing alcohol. Freeze the mixture in a sealable plastic bag. When you remove the mixture from the freezer, it will be slushy. Squeeze out the excess air. Place this bag inside another plastic bag. Slide the bag into a pillowcase or place a damp towel between your skin and the bag. SEEK MEDICAL CARE IF:  You develop white spots on your skin. This may give the skin a blotchy (mottled) appearance.  Your skin turns blue or pale.  Your skin becomes waxy or hard.  Your swelling gets worse. MAKE SURE YOU:   Understand these instructions.  Will watch your condition.  Will get help right away if you are not doing well or get worse.   This information is not intended to replace advice given to you by your health care provider. Make sure you discuss any questions you have with your health care  provider.   Document Released: 09/21/2010 Document Revised: 02/15/2014 Document Reviewed: 09/21/2010 Elsevier Interactive Patient Education [employer].

## 2015-05-17 NOTE — ED Notes (Signed)
Left knee pain that started today at 0100. Denies injury.

## 2015-05-17 NOTE — ED Notes (Signed)
NAD noted at time of D/C. Pt denies questions or concerns. Pt taken to the lobby via wheelchair at this time.  

## 2015-05-19 ENCOUNTER — Encounter: Payer: Self-pay | Admitting: Family Medicine

## 2015-05-19 ENCOUNTER — Ambulatory Visit (INDEPENDENT_AMBULATORY_CARE_PROVIDER_SITE_OTHER): Payer: Commercial Managed Care - HMO | Admitting: Family Medicine

## 2015-05-19 VITALS — BP 120/60 | HR 68 | Ht 66.0 in | Wt 185.0 lb

## 2015-05-19 DIAGNOSIS — E7849 Other hyperlipidemia: Secondary | ICD-10-CM

## 2015-05-19 DIAGNOSIS — I10 Essential (primary) hypertension: Secondary | ICD-10-CM | POA: Diagnosis not present

## 2015-05-19 DIAGNOSIS — Z029 Encounter for administrative examinations, unspecified: Secondary | ICD-10-CM

## 2015-05-19 DIAGNOSIS — E119 Type 2 diabetes mellitus without complications: Secondary | ICD-10-CM | POA: Diagnosis not present

## 2015-05-19 DIAGNOSIS — E784 Other hyperlipidemia: Secondary | ICD-10-CM | POA: Diagnosis not present

## 2015-05-19 DIAGNOSIS — Z024 Encounter for examination for driving license: Secondary | ICD-10-CM

## 2015-05-19 DIAGNOSIS — M109 Gout, unspecified: Secondary | ICD-10-CM | POA: Diagnosis not present

## 2015-05-19 MED ORDER — GLIPIZIDE 5 MG PO TABS: 5.0000 mg | ORAL_TABLET | Freq: Two times a day (BID) | ORAL | Status: DC

## 2015-05-19 MED ORDER — SIMVASTATIN 20 MG PO TABS: 20.0000 mg | ORAL_TABLET | Freq: Every day | ORAL | Status: DC

## 2015-05-19 MED ORDER — METFORMIN HCL 500 MG PO TABS: 500.0000 mg | ORAL_TABLET | Freq: Two times a day (BID) | ORAL | Status: DC

## 2015-05-19 MED ORDER — DILTIAZEM HCL ER 240 MG PO CP24: 240.0000 mg | ORAL_CAPSULE | Freq: Every day | ORAL | Status: DC

## 2015-05-19 MED ORDER — LISINOPRIL-HYDROCHLOROTHIAZIDE 20-25 MG PO TABS: 1.0000 | ORAL_TABLET | Freq: Every day | ORAL | Status: DC

## 2015-05-19 NOTE — Progress Notes (Signed)
Name: April [name]   MRN: [id number]    DOB: [birth year]   Date:05/19/2015       Progress Note  Subjective  Chief Complaint  Chief Complaint  Patient presents with  . 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  Diabetes She presents for her follow-up diabetic visit. She has type 2 diabetes mellitus. Her disease course has been stable. There are no hypoglycemic associated symptoms. Pertinent negatives for hypoglycemia include no confusion, dizziness, headaches, hunger, mood changes, nervousness/anxiousness, pallor, seizures, sleepiness, speech difficulty, sweats or tremors. Pertinent negatives for diabetes include no blurred vision, no chest pain, no fatigue, no foot paresthesias, no foot ulcerations, no polydipsia, no polyphagia, no polyuria, no visual change, no weakness and no weight loss. There are no hypoglycemic complications. There are no diabetic complications. Pertinent negatives for diabetic complications include no CVA, PVD or retinopathy. Current diabetic treatment includes oral agent (dual therapy). She is compliant with treatment all of the time. She is following a generally healthy diet. She has not had a previous visit with a dietitian. She participates in exercise daily. Her breakfast blood glucose is taken between 8-9 am. Her breakfast blood glucose range is generally 90-110 mg/dl. (Approx 95mg %) An ACE inhibitor/angiotensin II receptor blocker is being taken. She does not see a podiatrist.Eye exam is current.  Hypertension This is a chronic problem. The current episode started more than 1 year ago. The problem is unchanged. Pertinent negatives include no anxiety, blurred vision, chest pain, headaches, malaise/fatigue, neck pain, orthopnea, palpitations, peripheral edema, PND, shortness of breath or sweats. There are no associated agents to hypertension. Past  treatments include ACE inhibitors and diuretics. The current treatment provides mild improvement. There are no compliance problems.  There is no history of angina, kidney disease, CAD/MI, CVA, heart failure, left ventricular hypertrophy, PVD, renovascular disease or retinopathy. There is no history of chronic renal disease or a hypertension causing med.  Knee Pain  The incident occurred 2 days ago (acute gout attack). There was no injury mechanism. The pain is present in the left knee. The quality of the pain is described as stabbing. The pain is at a severity of 10/10. The pain is severe. The pain has been intermittent since onset. Associated symptoms include an inability to bear weight and a loss of sensation. Pertinent negatives include no tingling. The symptoms are aggravated by movement. She has tried NSAIDs for the symptoms. The treatment provided significant relief.    No problem-specific assessment & plan notes found for this encounter.   Past Medical History  Diagnosis Date  . Hypertension   . Diabetes mellitus without complication [facility])     Past Surgical History  Procedure Laterality Date  . Gallstones  06/12/2009  . Appendectomy      History reviewed. No pertinent family history.  Social History   Social History  . Marital Status: Widowed    Spouse Name: N/A  . Number of Children: N/A  . Years of Education: N/A   Occupational History  . Not on file.   Social History Main Topics  . Smoking status: Former Research scientist (life sciences)  . Smokeless tobacco: Not on file  . Alcohol Use: No  . Drug Use: No  . Sexual Activity: Not Currently   Other Topics Concern  . Not on file   Social History Narrative    No Known Allergies   Review of Systems  Constitutional: Negative for fever, chills, weight loss, malaise/fatigue and fatigue.  HENT: Negative for ear discharge, ear pain and sore throat.   Eyes: Negative for blurred vision.  Respiratory: Negative for cough, sputum production,  shortness of breath and wheezing.   Cardiovascular: Negative for chest pain, palpitations, orthopnea, leg swelling and PND.  Gastrointestinal: Negative for heartburn, nausea, abdominal pain, diarrhea, constipation, blood in stool and melena.  Genitourinary: Negative for dysuria, urgency, frequency and hematuria.  Musculoskeletal: Negative for myalgias, back pain, joint pain and neck pain.  Skin: Negative for pallor and rash.  Neurological: Negative for dizziness, tingling, tremors, sensory change, focal weakness, seizures, speech difficulty, weakness and headaches.  Endo/Heme/Allergies: Negative for environmental allergies, polydipsia and polyphagia. Does not bruise/bleed easily.  Psychiatric/Behavioral: Negative for depression, suicidal ideas and confusion. The patient is not nervous/anxious and does not have insomnia.      Objective  Filed Vitals:   05/19/15 1338  BP: 120/60  Pulse: 68  Height: 5\' 6"  (1.676 m)  Weight: 185 lb (83.915 kg)    Physical Exam  Constitutional: She is well-developed, well-nourished, and in no distress. No distress.  HENT:  Head: Normocephalic and atraumatic.  Right Ear: External ear normal.  Left Ear: External ear normal.  Nose: Nose normal.  Mouth/Throat: Oropharynx is clear and moist.  Eyes: Conjunctivae and EOM are normal. Pupils are equal, round, and reactive to light. Right eye exhibits no discharge. Left eye exhibits no discharge.  Neck: Normal range of motion. Neck supple. No JVD present. No thyromegaly present.  Cardiovascular: Normal rate, regular rhythm, normal heart sounds and intact distal pulses.  Exam reveals no gallop and no friction rub.   No murmur heard. Pulmonary/Chest: Effort normal and breath sounds normal.  Abdominal: Soft. Bowel sounds are normal. She exhibits no mass. There is no tenderness. There is no guarding.  Musculoskeletal: Normal range of motion. She exhibits no edema.  Lymphadenopathy:    She has no cervical  adenopathy.  Neurological: She is alert. She has normal motor skills, normal sensation, normal strength, normal reflexes and intact cranial nerves.  Monofilament normal  Skin: Skin is warm and dry. She is not diaphoretic.  Psychiatric: Mood and affect normal.  Nursing note and vitals reviewed.     Assessment & Plan  Problem List Items Addressed This Visit      Cardiovascular and Mediastinum   Essential (primary) hypertension   Relevant Medications   diltiazem (DILACOR XR) 240 MG 24 hr capsule   lisinopril-hydrochlorothiazide (PRINZIDE,ZESTORETIC) 20-25 MG tablet   simvastatin (ZOCOR) 20 MG tablet     Endocrine   Diabetes mellitus with no complication (HCC)   Relevant Medications   glipiZIDE (GLUCOTROL) 5 MG tablet   lisinopril-hydrochlorothiazide (PRINZIDE,ZESTORETIC) 20-25 MG tablet   metFORMIN (GLUCOPHAGE) 500 MG tablet   simvastatin (ZOCOR) 20 MG tablet   Other Relevant Orders   Hemoglobin A1c   Microalbumin / creatinine urine ratio     Other   Familial multiple lipoprotein-type hyperlipidemia   Relevant Medications   diltiazem (DILACOR XR) 240 MG 24 hr capsule   lisinopril-hydrochlorothiazide (PRINZIDE,ZESTORETIC) 20-25 MG tablet   simvastatin (ZOCOR) 20 MG tablet   Other Relevant Orders   Lipid Profile    Other Visit Diagnoses    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    -  Primary    Gout of left knee, unspecified cause, unspecified chronicity        resolved on NSAID    Essential hypertension  Relevant Medications    diltiazem (DILACOR XR) 240 MG 24 hr capsule    lisinopril-hydrochlorothiazide (PRINZIDE,ZESTORETIC) 20-25 MG tablet    simvastatin (ZOCOR) 20 MG tablet    Other Relevant Orders    Hemoglobin A1c    Renal Function Panel    Type 2 diabetes mellitus without complication, without long-term current use of insulin (HCC)        Relevant Medications    glipiZIDE (GLUCOTROL) 5 MG tablet     lisinopril-hydrochlorothiazide (PRINZIDE,ZESTORETIC) 20-25 MG tablet    metFORMIN (GLUCOPHAGE) 500 MG tablet    simvastatin (ZOCOR) 20 MG tablet    Other Relevant Orders    Hemoglobin A1c    Microalbumin / creatinine urine ratio         Dr. Len Klu[facility]  05/19/2015

## 2015-05-20 LAB — LIPID PANEL
Chol/HDL Ratio: 3 ratio units (ref 0.0–4.4)
Cholesterol, Total: 167 mg/dL (ref 100–199)
HDL: 55 mg/dL (ref >39)
LDL Calculated: 96 mg/dL (ref 0–99)
Triglycerides: 79 mg/dL (ref 0–149)
VLDL Cholesterol Cal: 16 mg/dL (ref 5–40)

## 2015-05-20 LAB — RENAL FUNCTION PANEL
Albumin: 4.6 g/dL (ref 3.5–4.8)
BUN/Creatinine Ratio: 19 (ref 12–28)
BUN: 22 mg/dL (ref 8–27)
CO2: 23 mmol/L (ref 18–29)
Calcium: 10.1 mg/dL (ref 8.7–10.3)
Chloride: 100 mmol/L (ref 96–106)
Creatinine, Ser: 1.18 mg/dL — ABNORMAL HIGH (ref 0.57–1.00)
GFR calc Af Amer: 52 mL/min/{1.73_m2} — ABNORMAL LOW (ref >59)
GFR calc non Af Amer: 45 mL/min/{1.73_m2} — ABNORMAL LOW (ref >59)
Glucose: 80 mg/dL (ref 65–99)
Phosphorus: 3.7 mg/dL (ref 2.5–4.5)
Potassium: 4.6 mmol/L (ref 3.5–5.2)
Sodium: 142 mmol/L (ref 134–144)

## 2015-05-20 LAB — HEMOGLOBIN A1C
Est. average glucose Bld gHb Est-mCnc: 183 mg/dL
Hgb A1c MFr Bld: 8 % — ABNORMAL HIGH (ref 4.8–5.6)

## 2015-05-20 LAB — MICROALBUMIN / CREATININE URINE RATIO
Creatinine, Urine: 60.8 mg/dL
MICROALB/CREAT RATIO: <4.9 mg/g creat (ref 0.0–30.0)
Microalbumin, Urine: <3 ug/mL

## 2015-05-22 DIAGNOSIS — E119 Type 2 diabetes mellitus without complications: Secondary | ICD-10-CM | POA: Diagnosis not present

## 2015-05-29 DIAGNOSIS — R7309 Other abnormal glucose: Secondary | ICD-10-CM | POA: Diagnosis not present

## 2015-05-29 DIAGNOSIS — E119 Type 2 diabetes mellitus without complications: Secondary | ICD-10-CM | POA: Diagnosis not present

## 2015-05-30 ENCOUNTER — Telehealth: Payer: Self-pay

## 2015-05-30 NOTE — Telephone Encounter (Signed)
Could not reach.

## 2015-05-30 NOTE — Telephone Encounter (Signed)
Tried to call [facility] End[facility] and they would not answer. I left message for them to call pt with results when they can.

## 2015-05-30 NOTE — Telephone Encounter (Signed)
Called to see if labs from [facility] were in yet Dr. Ledell Noss ordered them advised I will call them and check. Done on 05/29/2015.

## 2015-05-30 NOTE — Telephone Encounter (Signed)
Advised pt to wait for call from [facility] on this as we can not reveal results from them.

## 2015-06-06 DIAGNOSIS — E162 Hypoglycemia, unspecified: Secondary | ICD-10-CM | POA: Diagnosis not present

## 2015-06-06 DIAGNOSIS — E119 Type 2 diabetes mellitus without complications: Secondary | ICD-10-CM | POA: Diagnosis not present

## 2015-06-27 ENCOUNTER — Telehealth: Payer: Self-pay

## 2015-06-27 ENCOUNTER — Other Ambulatory Visit: Payer: Self-pay

## 2015-06-27 MED ORDER — NAPROXEN 500 MG PO TABS: 500.0000 mg | ORAL_TABLET | Freq: Two times a day (BID) | ORAL | Status: DC

## 2015-06-27 NOTE — Telephone Encounter (Signed)
Patient called for refill for Naproxen for knee pain. Ok to send refill per Dr. Ronnald Ramp.

## 2015-08-06 ENCOUNTER — Other Ambulatory Visit: Payer: Self-pay

## 2015-08-06 MED ORDER — CYCLOBENZAPRINE HCL 10 MG PO TABS: 10.0000 mg | ORAL_TABLET | Freq: Every day | ORAL | Status: DC

## 2015-08-29 DIAGNOSIS — E119 Type 2 diabetes mellitus without complications: Secondary | ICD-10-CM | POA: Diagnosis not present

## 2015-09-05 DIAGNOSIS — E119 Type 2 diabetes mellitus without complications: Secondary | ICD-10-CM | POA: Diagnosis not present

## 2015-09-12 ENCOUNTER — Other Ambulatory Visit: Payer: Self-pay | Admitting: Family Medicine

## 2015-10-02 ENCOUNTER — Other Ambulatory Visit: Payer: Self-pay | Admitting: Family Medicine

## 2015-10-09 ENCOUNTER — Other Ambulatory Visit: Payer: Self-pay | Admitting: Family Medicine

## 2015-10-30 ENCOUNTER — Other Ambulatory Visit: Payer: Self-pay

## 2015-10-30 DIAGNOSIS — Z1239 Encounter for other screening for malignant neoplasm of breast: Secondary | ICD-10-CM

## 2015-11-18 ENCOUNTER — Other Ambulatory Visit: Payer: Self-pay | Admitting: Family Medicine

## 2015-11-18 ENCOUNTER — Ambulatory Visit (INDEPENDENT_AMBULATORY_CARE_PROVIDER_SITE_OTHER): Payer: Commercial Managed Care - HMO | Admitting: Family Medicine

## 2015-11-18 ENCOUNTER — Encounter: Payer: Self-pay | Admitting: Family Medicine

## 2015-11-18 VITALS — BP 136/72 | HR 70 | Ht 66.0 in | Wt 181.0 lb

## 2015-11-18 DIAGNOSIS — E669 Obesity, unspecified: Secondary | ICD-10-CM

## 2015-11-18 DIAGNOSIS — I10 Essential (primary) hypertension: Secondary | ICD-10-CM | POA: Diagnosis not present

## 2015-11-18 DIAGNOSIS — E7849 Other hyperlipidemia: Secondary | ICD-10-CM

## 2015-11-18 DIAGNOSIS — E784 Other hyperlipidemia: Secondary | ICD-10-CM | POA: Diagnosis not present

## 2015-11-18 DIAGNOSIS — E119 Type 2 diabetes mellitus without complications: Secondary | ICD-10-CM

## 2015-11-18 MED ORDER — LISINOPRIL-HYDROCHLOROTHIAZIDE 20-25 MG PO TABS: 1.0000 | ORAL_TABLET | Freq: Every day | ORAL | 1 refills | Status: DC

## 2015-11-18 MED ORDER — GLUCOSE BLOOD VI STRP: ORAL_STRIP | 11 refills | Status: DC

## 2015-11-18 MED ORDER — SIMVASTATIN 20 MG PO TABS: 20.0000 mg | ORAL_TABLET | Freq: Every day | ORAL | 1 refills | Status: DC

## 2015-11-18 MED ORDER — GLIPIZIDE 5 MG PO TABS: 5.0000 mg | ORAL_TABLET | Freq: Two times a day (BID) | ORAL | 1 refills | Status: DC

## 2015-11-18 MED ORDER — METFORMIN HCL 500 MG PO TABS: 500.0000 mg | ORAL_TABLET | Freq: Two times a day (BID) | ORAL | 1 refills | Status: DC

## 2015-11-18 MED ORDER — DILTIAZEM HCL ER 240 MG PO CP24: 240.0000 mg | ORAL_CAPSULE | Freq: Every day | ORAL | 1 refills | Status: DC

## 2015-11-18 NOTE — Progress Notes (Signed)
Name: April [name]   MRN: [id number]    DOB: May 14, 1940   Date:11/18/2015       Progress Note  Subjective  Chief Complaint  Chief Complaint  Patient presents with  . Diabetes  . Hypertension  . Hyperlipidemia    Diabetes  She presents for her follow-up diabetic visit. She has type 2 diabetes mellitus. Her disease course has been stable. There are no hypoglycemic associated symptoms. Pertinent negatives for hypoglycemia include no dizziness, headaches, nervousness/anxiousness or sweats. Pertinent negatives for diabetes include no blurred vision, no chest pain, no fatigue, no foot paresthesias, no foot ulcerations, no polydipsia, no polyphagia, no polyuria, no visual change, no weakness and no weight loss. There are no hypoglycemic complications. Symptoms are stable. There are no diabetic complications. Pertinent negatives for diabetic complications include no CVA, PVD or retinopathy. There are no known risk factors for coronary artery disease. Current diabetic treatment includes oral agent (dual therapy). She is compliant with treatment all of the time. She is following a generally healthy diet. She has not had a previous visit with a dietitian. She participates in exercise intermittently. Her breakfast blood glucose is taken between 8-9 am. Her breakfast blood glucose range is generally 90-110 mg/dl. An ACE inhibitor/angiotensin II receptor blocker is being taken. She does not see a podiatrist.Eye exam is current.  Hypertension  This is a chronic problem. The current episode started more than 1 year ago. The problem has been gradually improving since onset. The problem is controlled. Pertinent negatives include no anxiety, blurred vision, chest pain, headaches, malaise/fatigue, neck pain, orthopnea, palpitations, peripheral edema, PND, shortness of breath or sweats. There are no associated agents to hypertension. Risk factors for coronary artery disease include dyslipidemia, obesity, post-menopausal  state and diabetes mellitus. Past treatments include calcium channel blockers, ACE inhibitors and diuretics. The current treatment provides moderate improvement. There are no compliance problems.  There is no history of angina, kidney disease, CAD/MI, CVA, heart failure, left ventricular hypertrophy, PVD, renovascular disease or retinopathy. There is no history of chronic renal disease or a hypertension causing med.  Hyperlipidemia  This is a chronic problem. The current episode started more than 1 month ago. The problem is controlled. Recent lipid tests were reviewed and are normal. She has no history of chronic renal disease. Pertinent negatives include no chest pain, focal weakness, myalgias or shortness of breath. Current antihyperlipidemic treatment includes statins. There are no compliance problems.  Risk factors for coronary artery disease include diabetes mellitus and hypertension.    No problem-specific Assessment & Plan notes found for this encounter.   Past Medical History:  Diagnosis Date  . Diabetes mellitus without complication ([place])   . Hypertension     Past Surgical History:  Procedure Laterality Date  . APPENDECTOMY    . gallstones  06/12/2009    History reviewed. No pertinent family history.  Social History   Social History  . Marital status: Widowed    Spouse name: N/A  . Number of children: N/A  . Years of education: N/A   Occupational History  . Not on file.   Social History Main Topics  . Smoking status: Former Research scientist (life sciences)  . Smokeless tobacco: Not on file  . Alcohol use No  . Drug use: No  . Sexual activity: Not Currently   Other Topics Concern  . Not on file   Social History Narrative  . No narrative on file    No Known Allergies   Review of Systems  Constitutional:  Negative for chills, fatigue, fever, malaise/fatigue and weight loss.  HENT: Negative for ear discharge, ear pain and sore throat.   Eyes: Negative for blurred vision.  Respiratory:  Negative for cough, sputum production, shortness of breath and wheezing.   Cardiovascular: Negative for chest pain, palpitations, orthopnea, leg swelling and PND.  Gastrointestinal: Negative for abdominal pain, blood in stool, constipation, diarrhea, heartburn, melena and nausea.  Genitourinary: Negative for dysuria, frequency, hematuria and urgency.  Musculoskeletal: Negative for back pain, joint pain, myalgias and neck pain.  Skin: Negative for rash.  Neurological: Negative for dizziness, tingling, sensory change, focal weakness, weakness and headaches.  Endo/Heme/Allergies: Negative for environmental allergies, polydipsia and polyphagia. Does not bruise/bleed easily.  Psychiatric/Behavioral: Negative for depression and suicidal ideas. The patient is not nervous/anxious and does not have insomnia.      Objective  Vitals:   11/18/15 0912  BP: 136/72  Pulse: 70  Weight: 181 lb (82.1 kg)  Height: 5\' 6"  (1.676 m)    Physical Exam  Constitutional: She is well-developed, well-nourished, and in no distress. No distress.  HENT:  Head: Normocephalic and atraumatic.  Right Ear: External ear normal.  Left Ear: External ear normal.  Nose: Nose normal.  Mouth/Throat: Oropharynx is clear and moist.  Eyes: Conjunctivae and EOM are normal. Pupils are equal, round, and reactive to light. Right eye exhibits no discharge. Left eye exhibits no discharge.  Neck: Normal range of motion. Neck supple. No JVD present. No thyromegaly present.  Cardiovascular: Normal rate, regular rhythm, normal heart sounds and intact distal pulses.  Exam reveals no gallop and no friction rub.   No murmur heard. Pulmonary/Chest: Effort normal and breath sounds normal.  Abdominal: Soft. Bowel sounds are normal. She exhibits no mass. There is no tenderness. There is no guarding.  Musculoskeletal: Normal range of motion. She exhibits no edema.  Lymphadenopathy:    She has no cervical adenopathy.  Neurological: She is  alert. She has normal reflexes.  Skin: Skin is warm and dry. She is not diaphoretic.  Psychiatric: Mood and affect normal.  Nursing note and vitals reviewed.     Assessment & Plan  Problem List Items Addressed This Visit      Cardiovascular and Mediastinum   Essential (primary) hypertension - Primary   Relevant Medications   diltiazem (DILACOR XR) 240 MG 24 hr capsule   lisinopril-hydrochlorothiazide (PRINZIDE,ZESTORETIC) 20-25 MG tablet   simvastatin (ZOCOR) 20 MG tablet   Other Relevant Orders   Renal Function Panel     Endocrine   Diabetes mellitus with no complication (HCC)   Relevant Medications   lisinopril-hydrochlorothiazide (PRINZIDE,ZESTORETIC) 20-25 MG tablet   simvastatin (ZOCOR) 20 MG tablet   glipiZIDE (GLUCOTROL) 5 MG tablet   metFORMIN (GLUCOPHAGE) 500 MG tablet   glucose blood (ACCU-CHEK AVIVA PLUS) test strip   Other Relevant Orders   Hemoglobin A1c     Other   Familial multiple lipoprotein-type hyperlipidemia   Relevant Medications   diltiazem (DILACOR XR) 240 MG 24 hr capsule   lisinopril-hydrochlorothiazide (PRINZIDE,ZESTORETIC) 20-25 MG tablet   simvastatin (ZOCOR) 20 MG tablet   Other Relevant Orders   Lipid Profile   Alimentary obesity   Relevant Medications   glipiZIDE (GLUCOTROL) 5 MG tablet   metFORMIN (GLUCOPHAGE) 500 MG tablet    Other Visit Diagnoses    Essential hypertension       Relevant Medications   diltiazem (DILACOR XR) 240 MG 24 hr capsule   lisinopril-hydrochlorothiazide (PRINZIDE,ZESTORETIC) 20-25 MG tablet   simvastatin (ZOCOR)  20 MG tablet   Type 2 diabetes mellitus without complication, without long-term current use of insulin (HCC)       Relevant Medications   lisinopril-hydrochlorothiazide (PRINZIDE,ZESTORETIC) 20-25 MG tablet   simvastatin (ZOCOR) 20 MG tablet   glipiZIDE (GLUCOTROL) 5 MG tablet   metFORMIN (GLUCOPHAGE) 500 MG tablet     I spent 25 minutes with this patient, More than 50% of that time was spent  in face to face education, counseling and care coordination.   Dr. Mac[facility]  11/18/15

## 2015-11-19 LAB — RENAL FUNCTION PANEL
Albumin: 4.6 g/dL (ref 3.5–4.8)
BUN/Creatinine Ratio: 19 (ref 12–28)
BUN: 21 mg/dL (ref 8–27)
CO2: 25 mmol/L (ref 18–29)
Calcium: 10.2 mg/dL (ref 8.7–10.3)
Chloride: 97 mmol/L (ref 96–106)
Creatinine, Ser: 1.08 mg/dL — ABNORMAL HIGH (ref 0.57–1.00)
GFR calc Af Amer: 58 mL/min/{1.73_m2} — ABNORMAL LOW (ref >59)
GFR calc non Af Amer: 50 mL/min/{1.73_m2} — ABNORMAL LOW (ref >59)
Glucose: 64 mg/dL — ABNORMAL LOW (ref 65–99)
Phosphorus: 3.5 mg/dL (ref 2.5–4.5)
Potassium: 4.2 mmol/L (ref 3.5–5.2)
Sodium: 140 mmol/L (ref 134–144)

## 2015-11-19 LAB — LIPID PANEL
Chol/HDL Ratio: 3.2 ratio units (ref 0.0–4.4)
Cholesterol, Total: 164 mg/dL (ref 100–199)
HDL: 51 mg/dL (ref >39)
LDL Calculated: 93 mg/dL (ref 0–99)
Triglycerides: 101 mg/dL (ref 0–149)
VLDL Cholesterol Cal: 20 mg/dL (ref 5–40)

## 2015-11-19 LAB — HEMOGLOBIN A1C
Est. average glucose Bld gHb Est-mCnc: 171 mg/dL
Hgb A1c MFr Bld: 7.6 % — ABNORMAL HIGH (ref 4.8–5.6)

## 2015-12-08 ENCOUNTER — Ambulatory Visit
Admission: RE | Admit: 2015-12-08 | Discharge: 2015-12-08 | Disposition: A | Discharge status: Home / Self Care | Payer: Commercial Managed Care - HMO | Source: Ambulatory Visit | Attending: Family Medicine | Admitting: Family Medicine

## 2015-12-08 DIAGNOSIS — Z1239 Encounter for other screening for malignant neoplasm of breast: Secondary | ICD-10-CM

## 2015-12-08 DIAGNOSIS — Z1231 Encounter for screening mammogram for malignant neoplasm of breast: Secondary | ICD-10-CM | POA: Insufficient documentation

## 2016-01-30 ENCOUNTER — Other Ambulatory Visit: Payer: Self-pay

## 2016-01-30 MED ORDER — AMOXICILLIN 500 MG PO CAPS: 500.0000 mg | ORAL_CAPSULE | Freq: Three times a day (TID) | ORAL | 0 refills | Status: DC

## 2016-02-17 ENCOUNTER — Other Ambulatory Visit: Payer: Self-pay | Admitting: Family Medicine

## 2016-04-02 ENCOUNTER — Other Ambulatory Visit: Payer: Self-pay | Admitting: Family Medicine

## 2016-04-05 ENCOUNTER — Other Ambulatory Visit: Payer: Self-pay

## 2016-04-05 DIAGNOSIS — E7849 Other hyperlipidemia: Secondary | ICD-10-CM

## 2016-04-05 DIAGNOSIS — E119 Type 2 diabetes mellitus without complications: Secondary | ICD-10-CM

## 2016-04-05 DIAGNOSIS — I10 Essential (primary) hypertension: Secondary | ICD-10-CM

## 2016-04-05 MED ORDER — DILTIAZEM HCL ER 240 MG PO CP24: 240.0000 mg | ORAL_CAPSULE | Freq: Every day | ORAL | 1 refills | Status: DC

## 2016-04-05 MED ORDER — METFORMIN HCL 500 MG PO TABS: 500.0000 mg | ORAL_TABLET | Freq: Two times a day (BID) | ORAL | 1 refills | Status: DC

## 2016-04-05 MED ORDER — LISINOPRIL-HYDROCHLOROTHIAZIDE 20-25 MG PO TABS: 1.0000 | ORAL_TABLET | Freq: Every day | ORAL | 1 refills | Status: DC

## 2016-04-05 MED ORDER — GLIPIZIDE 5 MG PO TABS: 5.0000 mg | ORAL_TABLET | Freq: Two times a day (BID) | ORAL | 1 refills | Status: DC

## 2016-04-05 MED ORDER — SIMVASTATIN 20 MG PO TABS: 20.0000 mg | ORAL_TABLET | Freq: Every day | ORAL | 1 refills | Status: DC

## 2016-04-09 ENCOUNTER — Other Ambulatory Visit: Payer: Self-pay

## 2016-04-09 DIAGNOSIS — E119 Type 2 diabetes mellitus without complications: Secondary | ICD-10-CM

## 2016-04-09 MED ORDER — GLUCOSE BLOOD VI STRP: ORAL_STRIP | 1 refills | Status: DC

## 2016-04-14 ENCOUNTER — Other Ambulatory Visit: Payer: Self-pay | Admitting: Family Medicine

## 2016-04-22 ENCOUNTER — Other Ambulatory Visit: Payer: Self-pay | Admitting: Family Medicine

## 2016-05-26 ENCOUNTER — Other Ambulatory Visit: Payer: Self-pay | Admitting: Family Medicine

## 2016-06-05 ENCOUNTER — Encounter: Payer: Self-pay | Admitting: Medical Oncology

## 2016-06-05 ENCOUNTER — Emergency Department
Admission: EM | Admit: 2016-06-05 | Discharge: 2016-06-05 | Disposition: A | Discharge status: Home / Self Care | Payer: Medicare Other | Attending: Emergency Medicine | Admitting: Emergency Medicine

## 2016-06-05 DIAGNOSIS — J029 Acute pharyngitis, unspecified: Secondary | ICD-10-CM | POA: Diagnosis present

## 2016-06-05 DIAGNOSIS — E119 Type 2 diabetes mellitus without complications: Secondary | ICD-10-CM | POA: Diagnosis not present

## 2016-06-05 DIAGNOSIS — Z87891 Personal history of nicotine dependence: Secondary | ICD-10-CM | POA: Insufficient documentation

## 2016-06-05 DIAGNOSIS — I10 Essential (primary) hypertension: Secondary | ICD-10-CM | POA: Diagnosis not present

## 2016-06-05 DIAGNOSIS — Z7982 Long term (current) use of aspirin: Secondary | ICD-10-CM | POA: Diagnosis not present

## 2016-06-05 DIAGNOSIS — Z7984 Long term (current) use of oral hypoglycemic drugs: Secondary | ICD-10-CM | POA: Insufficient documentation

## 2016-06-05 DIAGNOSIS — Z79899 Other long term (current) drug therapy: Secondary | ICD-10-CM | POA: Diagnosis not present

## 2016-06-05 DIAGNOSIS — J020 Streptococcal pharyngitis: Secondary | ICD-10-CM | POA: Diagnosis not present

## 2016-06-05 LAB — INFLUENZA PANEL BY PCR (TYPE A & B)
Influenza A By PCR: NEGATIVE
Influenza B By PCR: NEGATIVE

## 2016-06-05 LAB — POCT RAPID STREP A
Streptococcus, Group A Screen (Direct): NEGATIVE
Streptococcus, Group A Screen (Direct): POSITIVE — AB

## 2016-06-05 LAB — COMPREHENSIVE METABOLIC PANEL
ALT: 18 U/L (ref 14–54)
AST: 26 U/L (ref 15–41)
Albumin: 4.2 g/dL (ref 3.5–5.0)
Alkaline Phosphatase: 61 U/L (ref 38–126)
Anion gap: 11 (ref 5–15)
BUN: 15 mg/dL (ref 6–20)
CO2: 27 mmol/L (ref 22–32)
Calcium: 9.4 mg/dL (ref 8.9–10.3)
Chloride: 100 mmol/L — ABNORMAL LOW (ref 101–111)
Creatinine, Ser: 1.05 mg/dL — ABNORMAL HIGH (ref 0.44–1.00)
GFR calc Af Amer: 58 mL/min — ABNORMAL LOW (ref >60)
GFR calc non Af Amer: 50 mL/min — ABNORMAL LOW (ref >60)
Glucose, Bld: 65 mg/dL (ref 65–99)
Potassium: 3.8 mmol/L (ref 3.5–5.1)
Sodium: 138 mmol/L (ref 135–145)
Total Bilirubin: 0.9 mg/dL (ref 0.3–1.2)
Total Protein: 7.7 g/dL (ref 6.5–8.1)

## 2016-06-05 LAB — CBC
HCT: 35.9 % (ref 35.0–47.0)
Hemoglobin: 11.7 g/dL — ABNORMAL LOW (ref 12.0–16.0)
MCH: 27.7 pg (ref 26.0–34.0)
MCHC: 32.7 g/dL (ref 32.0–36.0)
MCV: 84.7 fL (ref 80.0–100.0)
Platelets: 252 10*3/uL (ref 150–440)
RBC: 4.23 MIL/uL (ref 3.80–5.20)
RDW: 13.8 % (ref 11.5–14.5)
WBC: 22.9 10*3/uL — ABNORMAL HIGH (ref 3.6–11.0)

## 2016-06-05 MED ORDER — LIDOCAINE VISCOUS 2 % MT SOLN
15.0000 mL | Freq: Once | OROMUCOSAL | Status: AC
  Administered 2016-06-05: 15 mL via OROMUCOSAL
  Filled 2016-06-05: qty 15

## 2016-06-05 MED ORDER — AMOXICILLIN 500 MG PO CAPS
500.0000 mg | ORAL_CAPSULE | Freq: Once | ORAL | Status: AC
  Administered 2016-06-05: 500 mg via ORAL
  Filled 2016-06-05: qty 1

## 2016-06-05 MED ORDER — LIDOCAINE VISCOUS 2 % MT SOLN: 10.0000 mL | OROMUCOSAL | 0 refills | Status: DC | PRN

## 2016-06-05 MED ORDER — AMOXICILLIN 500 MG PO CAPS: 500.0000 mg | ORAL_CAPSULE | Freq: Two times a day (BID) | ORAL | 0 refills | Status: DC

## 2016-06-05 NOTE — ED Notes (Signed)
Patient is complaining of sore throat since yesterday.  Reports pain with swallowing.  Patient is also complaining of body chills.  Patient appears tired.  Patient denies fever.  Patient is in no obvious distress at this time.

## 2016-06-05 NOTE — ED Triage Notes (Signed)
Pt reports body aches, chills and sore throat that began yesterday.

## 2016-06-05 NOTE — ED Provider Notes (Signed)
[facility] Emergency Department Provider Note  ____________________________________________  Time seen: Approximately 11:45 AM  I have reviewed the triage vital signs and the nursing notes.   HISTORY  Chief Complaint Sore Throat; Chills; and Generalized Body Aches    HPI April [name] is a 76 y.o. female that presents to the emergency department with fatigue, chills, and sore throat for 1 day. She is eating and drinking normally. Patient states that she felt well before yesterday. She has had her tonsils removed. She denies headache, nasal congestion, cough, shortness of breath, chest pain, nausea, vomiting, abdominal pain, diarrhea, constipation.   Past Medical History:  Diagnosis Date  . Diabetes mellitus without complication ([place])   . Hypertension     Patient Active Problem List   Diagnosis Date Noted  . Diabetes mellitus with no complication ([place]) 16/02/930  . 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 Essential (primary) hypertension 06/20/2014  . Routine sports examination 06/20/2014  . Health examination of defined subpopulation 06/20/2014    Past Surgical History:  Procedure Laterality Date  . APPENDECTOMY    . gallstones  06/12/2009    Prior to Admission medications   Medication Sig Start Date End Date Taking? Authorizing Provider  ACCU-CHEK AVIVA PLUS test strip TEST BLOOD SUGAR DAILY AS DIRECTED 04/22/16   Juline Patch, MD  amoxicillin (AMOXIL) 500 MG capsule Take 1 capsule (500 mg total) by mouth 2 (two) times daily. 06/05/16   Laban Emperor, PA-C  aspirin 81 MG tablet Take 1 tablet by mouth daily.    Historical Provider, MD  cyclobenzaprine (FLEXERIL) 10 MG tablet TAKE 1 TABLET(10 MG) BY MOUTH AT BEDTIME 04/14/16   Juline Patch, MD  diltiazem (DILACOR XR) 240 MG 24 hr capsule Take 1 capsule (240 mg total) by mouth daily. 04/05/16   Juline Patch, MD  glipiZIDE (GLUCOTROL) 5 MG  tablet Take 1 tablet (5 mg total) by mouth 2 (two) times daily. 04/05/16   Juline Patch, MD  glucose blood (ACCU-CHEK AVIVA PLUS) test strip USE AS DIRECTED TO TEST BLOOD SUGAR DAILY- E11.9 04/09/16   Juline Patch, MD  lidocaine (XYLOCAINE) 2 % solution Use as directed 10 mLs in the mouth or throat as needed for mouth pain. 06/05/16   Laban Emperor, PA-C  lisinopril-hydrochlorothiazide (PRINZIDE,ZESTORETIC) 20-25 MG tablet Take 1 tablet by mouth daily. 04/05/16   Juline Patch, MD  metFORMIN (GLUCOPHAGE) 500 MG tablet Take 1 tablet (500 mg total) by mouth 2 (two) times daily. 04/05/16   Juline Patch, MD  naproxen (NAPROSYN) 500 MG tablet TAKE 1 TABLET(500 MG) BY MOUTH TWICE DAILY WITH A MEAL 05/27/16   Juline Patch, MD  simvastatin (ZOCOR) 20 MG tablet Take 1 tablet (20 mg total) by mouth daily. 04/05/16   Juline Patch, MD    Allergies Patient has no known allergies.  Family History  Problem Relation Age of Onset  . Breast cancer Neg Hx     Social History Social History  Substance Use Topics  . Smoking status: Former Research scientist (life sciences)  . Smokeless tobacco: Not on file  . Alcohol use No     Review of Systems  Constitutional: Positive for chills. Eyes: No visual changes. No discharge. ENT: Negative for congestion and rhinorrhea. Cardiovascular: No chest pain. Respiratory: Negative for cough. No SOB. Gastrointestinal: No abdominal pain.  No nausea, no vomiting.  No diarrhea.  No constipation. Musculoskeletal: Negative for musculoskeletal pain. Skin: Negative for  rash, abrasions, lacerations, ecchymosis. Neurological: Negative for headaches.   ____________________________________________   PHYSICAL EXAM:  VITAL SIGNS: ED Triage Vitals [06/05/16 1034]  Enc Vitals Group     BP (!) 114/59     Pulse Rate 90     Resp 18     Temp 99.3 F (37.4 C)     Temp Source Oral     SpO2 97 %     Weight 180 lb (81.6 kg)     Height 5\' 6"  (1.676 m)     Head Circumference      Peak Flow       Pain Score 10     Pain Loc      Pain Edu?      Excl. in [place]?      Constitutional: Alert and oriented.  Eyes: Conjunctivae are normal. PERRL. EOMI. No discharge. Head: Atraumatic. ENT: No frontal and maxillary sinus tenderness.      Ears: Tympanic membranes pearly gray with good landmarks. No discharge.      Nose: Mild congestion/rhinnorhea.      Mouth/Throat: Mucous membranes are moist. Oropharynx non-erythematous. Tonsils absent. Exudates present. Uvula midline. Neck: No stridor.   Hematological/Lymphatic/Immunilogical: Tender anterior cervical lymphadenopathy. Cardiovascular: Normal rate, regular rhythm.  Good peripheral circulation. Respiratory: Normal respiratory effort without tachypnea or retractions. Lungs CTAB. Good air entry to the bases with no decreased or absent breath sounds. Gastrointestinal: Bowel sounds 4 quadrants. Soft and nontender to palpation. No guarding or rigidity. No palpable masses. No distention. Musculoskeletal: Full range of motion to all extremities. No gross deformities appreciated. Neurologic:  Normal speech and language. No gross focal neurologic deficits are appreciated.  Skin:  Skin is warm, dry and intact. No rash noted.   ____________________________________________   LABS (all labs ordered are listed, but only abnormal results are displayed)  Labs Reviewed  CBC - Abnormal; Notable for the following:       Result Value   WBC 22.9 (*)    Hemoglobin 11.7 (*)    All other components within normal limits  COMPREHENSIVE METABOLIC PANEL - Abnormal; Notable for the following:    Chloride 100 (*)    Creatinine, Ser 1.05 (*)    GFR calc non Af Amer 50 (*)    GFR calc Af Amer 58 (*)    All other components within normal limits  POCT RAPID STREP A - Abnormal; Notable for the following:    Streptococcus, Group A Screen (Direct) POSITIVE (*)    All other components within normal limits  CULTURE, GROUP A STREP ([facility])  INFLUENZA PANEL BY PCR (TYPE A  & B)  POCT RAPID STREP A   ____________________________________________  EKG   ____________________________________________  RADIOLOGY  No results found.  ____________________________________________    PROCEDURES  Procedure(s) performed:    Procedures    Medications  lidocaine (XYLOCAINE) 2 % viscous mouth solution 15 mL (15 mLs Mouth/Throat Given 06/05/16 1221)  amoxicillin (AMOXIL) capsule 500 mg (500 mg Oral Given 06/05/16 1303)     ____________________________________________   INITIAL IMPRESSION / ASSESSMENT AND PLAN / ED COURSE  Pertinent labs & imaging results that were available during my care of the patient were reviewed by me and considered in my medical decision making (see chart for details).  Review of the [place] CSRS was performed in accordance of the [place] prior to dispensing any controlled drugs.   Patient's diagnosis is consistent with strep pharyngitis. Vital signs and exam are reassuring. Rapid strep positive. Patient appears tired  but well. She feels comfortable going home. Patient will be discharged home with prescriptions for amoxicillin and viscous lidocaine. Patient is to follow up with  PCP as needed or otherwise directed. Patient is given ED precautions to return to the ED for any worsening or new symptoms.     ____________________________________________  FINAL CLINICAL IMPRESSION(S) / ED DIAGNOSES  Final diagnoses:  Strep pharyngitis      NEW MEDICATIONS STARTED DURING THIS VISIT:  Discharge Medication List as of 06/05/2016  1:21 PM    START taking these medications   Details  lidocaine (XYLOCAINE) 2 % solution Use as directed 10 mLs in the mouth or throat as needed for mouth pain., Starting Sat 06/05/2016, Print            This chart was dictated using voice recognition software/Dragon. Despite best efforts to proofread, errors can occur which can change the meaning. Any change was purely unintentional.    Laban Emperor,  PA-C 06/05/16 [place], MD 06/06/16 [id number]

## 2016-06-07 LAB — CULTURE, GROUP A STREP (THRC)

## 2016-06-10 ENCOUNTER — Emergency Department
Admission: EM | Admit: 2016-06-10 | Discharge: 2016-06-10 | Disposition: A | Discharge status: Home / Self Care | Payer: Medicare Other | Attending: Emergency Medicine | Admitting: Emergency Medicine

## 2016-06-10 ENCOUNTER — Encounter: Payer: Self-pay | Admitting: *Deleted

## 2016-06-10 ENCOUNTER — Emergency Department: Payer: Medicare Other

## 2016-06-10 DIAGNOSIS — Z79899 Other long term (current) drug therapy: Secondary | ICD-10-CM | POA: Insufficient documentation

## 2016-06-10 DIAGNOSIS — Z7982 Long term (current) use of aspirin: Secondary | ICD-10-CM | POA: Diagnosis not present

## 2016-06-10 DIAGNOSIS — M10072 Idiopathic gout, left ankle and foot: Secondary | ICD-10-CM | POA: Insufficient documentation

## 2016-06-10 DIAGNOSIS — M109 Gout, unspecified: Secondary | ICD-10-CM

## 2016-06-10 DIAGNOSIS — E119 Type 2 diabetes mellitus without complications: Secondary | ICD-10-CM | POA: Insufficient documentation

## 2016-06-10 DIAGNOSIS — I10 Essential (primary) hypertension: Secondary | ICD-10-CM | POA: Insufficient documentation

## 2016-06-10 DIAGNOSIS — Z7984 Long term (current) use of oral hypoglycemic drugs: Secondary | ICD-10-CM | POA: Diagnosis not present

## 2016-06-10 DIAGNOSIS — Z87891 Personal history of nicotine dependence: Secondary | ICD-10-CM | POA: Insufficient documentation

## 2016-06-10 DIAGNOSIS — M25572 Pain in left ankle and joints of left foot: Secondary | ICD-10-CM | POA: Diagnosis not present

## 2016-06-10 DIAGNOSIS — M79672 Pain in left foot: Secondary | ICD-10-CM | POA: Diagnosis not present

## 2016-06-10 DIAGNOSIS — M7989 Other specified soft tissue disorders: Secondary | ICD-10-CM | POA: Diagnosis not present

## 2016-06-10 LAB — COMPREHENSIVE METABOLIC PANEL
ALT: 19 U/L (ref 14–54)
AST: 29 U/L (ref 15–41)
Albumin: 4.1 g/dL (ref 3.5–5.0)
Alkaline Phosphatase: 55 U/L (ref 38–126)
Anion gap: 10 (ref 5–15)
BUN: 20 mg/dL (ref 6–20)
CO2: 27 mmol/L (ref 22–32)
Calcium: 9.8 mg/dL (ref 8.9–10.3)
Chloride: 102 mmol/L (ref 101–111)
Creatinine, Ser: 1.22 mg/dL — ABNORMAL HIGH (ref 0.44–1.00)
GFR calc Af Amer: 48 mL/min — ABNORMAL LOW (ref >60)
GFR calc non Af Amer: 42 mL/min — ABNORMAL LOW (ref >60)
Glucose, Bld: 93 mg/dL (ref 65–99)
Potassium: 3.7 mmol/L (ref 3.5–5.1)
Sodium: 139 mmol/L (ref 135–145)
Total Bilirubin: 0.6 mg/dL (ref 0.3–1.2)
Total Protein: 7.9 g/dL (ref 6.5–8.1)

## 2016-06-10 LAB — CBC
HCT: 34.9 % — ABNORMAL LOW (ref 35.0–47.0)
Hemoglobin: 11.3 g/dL — ABNORMAL LOW (ref 12.0–16.0)
MCH: 27.8 pg (ref 26.0–34.0)
MCHC: 32.4 g/dL (ref 32.0–36.0)
MCV: 86 fL (ref 80.0–100.0)
Platelets: 314 10*3/uL (ref 150–440)
RBC: 4.07 MIL/uL (ref 3.80–5.20)
RDW: 13.8 % (ref 11.5–14.5)
WBC: 9.4 10*3/uL (ref 3.6–11.0)

## 2016-06-10 LAB — URIC ACID: Uric Acid, Serum: 10.7 mg/dL — ABNORMAL HIGH (ref 2.3–6.6)

## 2016-06-10 LAB — SEDIMENTATION RATE: Sed Rate: 35 mm/hr — ABNORMAL HIGH (ref 0–30)

## 2016-06-10 MED ORDER — PREDNISOLONE SODIUM PHOSPHATE 15 MG/5ML PO SOLN
40.0000 mg | Freq: Once | ORAL | Status: AC
  Administered 2016-06-10: 40 mg via ORAL
  Filled 2016-06-10: qty 15

## 2016-06-10 MED ORDER — PREDNISOLONE 15 MG/5ML PO SOLN: 40.0000 mg | Freq: Every day | ORAL | 0 refills | Status: AC

## 2016-06-10 NOTE — ED Provider Notes (Signed)
[facility] Emergency Department Provider Note  ____________________________________________  Time seen: Approximately 7:14 PM  I have reviewed the triage vital signs and the nursing notes.   HISTORY  Chief Complaint Foot Pain    HPI Bowers is a 76 y.o. female that presents to emergency department with left foot pain and swelling for 1 day. Patient states that she woke up this morning and foot was swelling. No injuries. She was seen in the emergency department on Saturday for strep throat. She has a history of gout. She is currently taking antibiotics for strep throat. She denies fever, sore throat, shortness of breath, chest pain, nausea, vomiting, abdominal pain.   Past Medical History:  Diagnosis Date  . Diabetes mellitus without complication ([place])   . Hypertension     Patient Active Problem List   Diagnosis Date Noted  . Diabetes mellitus with no complication ([place]) 16/11/9602  . 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 Essential (primary) hypertension 06/20/2014  . Routine sports examination 06/20/2014  . Health examination of defined subpopulation 06/20/2014    Past Surgical History:  Procedure Laterality Date  . APPENDECTOMY    . gallstones  06/12/2009    Prior to Admission medications   Medication Sig Start Date End Date Taking? Authorizing Provider  ACCU-CHEK AVIVA PLUS test strip TEST BLOOD SUGAR DAILY AS DIRECTED 04/22/16   Juline Patch, MD  aspirin 81 MG tablet Take 1 tablet by mouth daily.    Historical Provider, MD  diltiazem (DILACOR XR) 240 MG 24 hr capsule Take 1 capsule (240 mg total) by mouth daily. 04/05/16   Juline Patch, MD  glipiZIDE (GLUCOTROL) 5 MG tablet Take 1 tablet (5 mg total) by mouth 2 (two) times daily. 04/05/16   Juline Patch, MD  glucose blood (ACCU-CHEK AVIVA PLUS) test strip USE AS DIRECTED TO TEST BLOOD SUGAR DAILY- E11.9 04/09/16   Juline Patch,  MD  lisinopril-hydrochlorothiazide (PRINZIDE,ZESTORETIC) 20-25 MG tablet Take 1 tablet by mouth daily. 04/05/16   Juline Patch, MD  metFORMIN (GLUCOPHAGE) 500 MG tablet Take 1 tablet (500 mg total) by mouth 2 (two) times daily. 04/05/16   Juline Patch, MD  naproxen (NAPROSYN) 500 MG tablet TAKE 1 TABLET(500 MG) BY MOUTH TWICE DAILY WITH A MEAL 05/27/16   Juline Patch, MD  prednisoLONE (PRELONE) 15 MG/5ML SOLN Take 13.3 mLs (40 mg total) by mouth daily before breakfast. 06/10/16 06/15/16  Laban Emperor, PA-C  simvastatin (ZOCOR) 20 MG tablet Take 1 tablet (20 mg total) by mouth daily. 04/05/16   Juline Patch, MD    Allergies Patient has no known allergies.  Family History  Problem Relation Age of Onset  . Breast cancer Neg Hx     Social History Social History  Substance Use Topics  . Smoking status: Former Research scientist (life sciences)  . Smokeless tobacco: Never Used  . Alcohol use No     Review of Systems  Constitutional: No fever/chills Cardiovascular: No chest pain. Respiratory:No SOB. Gastrointestinal: No abdominal pain.  No nausea, no vomiting.  Musculoskeletal: Negative for musculoskeletal pain. Skin: Negative for abrasions, lacerations, ecchymosis. Neurological: Negative for headaches, numbness or tingling   ____________________________________________   PHYSICAL EXAM:  VITAL SIGNS: ED Triage Vitals  Enc Vitals Group     BP 06/10/16 1841 125/60     Pulse Rate 06/10/16 1841 79     Resp 06/10/16 1841 16     Temp 06/10/16 1841 98.8 F (  37.1 C)     Temp Source 06/10/16 1841 Oral     SpO2 06/10/16 1841 100 %     Weight 06/10/16 1841 180 lb (81.6 kg)     Height 06/10/16 1841 5' 6" (1.676 m)     Head Circumference --      Peak Flow --      Pain Score 06/10/16 1843 10     Pain Loc --      Pain Edu? --      Excl. in [place]? --      Constitutional: Alert and oriented. Well appearing and in no acute distress. Eyes: Conjunctivae are normal. PERRL. EOMI. Head: Atraumatic. ENT:       Ears:      Nose: No congestion/rhinnorhea.      Mouth/Throat: Mucous membranes are moist. Oropharynx non-erythematous. Neck: No stridor.   Cardiovascular: Normal rate, regular rhythm.  Good peripheral circulation. Respiratory: Normal respiratory effort without tachypnea or retractions. Lungs CTAB. Good air entry to the bases with no decreased or absent breath sounds. Musculoskeletal: Full range of motion to all extremities. No gross deformities appreciated. Swelling over left ankle and medial side of left foot. Tender to palpation. Area of erythema over medial side of arch of left foot. Neurologic:  Normal speech and language. No gross focal neurologic deficits are appreciated.  Skin:  Skin is warm, dry and intact. No rash noted.   ____________________________________________   LABS (all labs ordered are listed, but only abnormal results are displayed)  Labs Reviewed  CBC - Abnormal; Notable for the following:       Result Value   Hemoglobin 11.3 (*)    HCT 34.9 (*)    All other components within normal limits  COMPREHENSIVE METABOLIC PANEL - Abnormal; Notable for the following:    Creatinine, Ser 1.22 (*)    GFR calc non Af Amer 42 (*)    GFR calc Af Amer 48 (*)    All other components within normal limits  URIC ACID - Abnormal; Notable for the following:    Uric Acid, Serum 10.7 (*)    All other components within normal limits  SEDIMENTATION RATE - Abnormal; Notable for the following:    Sed Rate 35 (*)    All other components within normal limits   ____________________________________________  EKG   ____________________________________________  RADIOLOGY Robinette Haines, personally viewed and evaluated these images (plain radiographs) as part of my medical decision making, as well as reviewing the written report by the radiologist.  Dg Ankle Complete Left  Result Date: 06/10/2016 CLINICAL DATA:  Swelling, warmth and redness, pain with movement and palpation. History  of gout, diabetes. EXAM: LEFT ANKLE COMPLETE - 3+ VIEW; LEFT FOOT - COMPLETE 3+ VIEW COMPARISON:  None. FINDINGS: No fracture deformity nor dislocation. The ankle mortise appears congruent and the tibiofibular syndesmosis intact. Moderate midfoot osteoarthrosis. Small plantar calcaneal spur. Midfoot enthesopathy. 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 No destructive bony lesions. Soft tissue planes are non-suspicious. Scattered phleboliths. Mild vascular calcifications. IMPRESSION: No acute osseous process or destructive bony lesions. Cortical thickening base of fourth metatarsus most consistent with stress response. Electronically Signed   By: Elon Alas M.D.   On: 06/10/2016 19:45   Dg Foot Complete Left  Result Date: 06/10/2016 CLINICAL DATA:  Swelling, warmth and redness, pain with movement and palpation. History of gout, diabetes. EXAM: LEFT ANKLE COMPLETE - 3+ VIEW; LEFT FOOT - COMPLETE 3+ VIEW COMPARISON:  None. FINDINGS: No fracture deformity  nor dislocation. The ankle mortise appears congruent and the tibiofibular syndesmosis intact. Moderate midfoot osteoarthrosis. Small plantar calcaneal spur. Midfoot enthesopathy. 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 No destructive bony lesions. Soft tissue planes are non-suspicious. Scattered phleboliths. Mild vascular calcifications. IMPRESSION: No acute osseous process or destructive bony lesions. Cortical thickening base of fourth metatarsus most consistent with stress response. Electronically Signed   By: Elon Alas M.D.   On: 06/10/2016 19:45    ____________________________________________    PROCEDURES  Procedure(s) performed:    Procedures    Medications  prednisoLONE (ORAPRED) 15 MG/5ML solution 40 mg (40 mg Oral Given 06/10/16 2053)     ____________________________________________   INITIAL IMPRESSION / ASSESSMENT AND PLAN / ED COURSE  Pertinent labs & imaging results that  were available during my care of the patient were reviewed by me and considered in my medical decision making (see chart for details).  Review of the [place] CSRS was performed in accordance of the [place] prior to dispensing any controlled drugs.     Patient's diagnosis is consistent with gout. Vital signs and exam are reassuring. Ankle and foot x-ray negative for acute bony abnormalities. Uric acid and ESR are elevated. Patient has diabetes. Due to increase in creatinine, I do not want to treat with NSAIDs or colchicine. Although prednisone will raise her blood sugar, I think this is a better option then NSAIDs or colchicine. Patient checks her blood sugars regularly and states that they usually run in the 90s. She states that she will continue to check her blood sugars every day. She is going to follow up with her PCP early next week. Patient will be discharged home with prescriptions for prednisone. She requests a liquid form.  Patient is given ED precautions to return to the ED for any worsening or new symptoms.     ____________________________________________  FINAL CLINICAL IMPRESSION(S) / ED DIAGNOSES  Final diagnoses:  Acute gout of left ankle, unspecified cause      NEW MEDICATIONS STARTED DURING THIS VISIT:  Discharge Medication List as of 06/10/2016  8:52 PM    START taking these medications   Details  prednisoLONE (PRELONE) 15 MG/5ML SOLN Take 13.3 mLs (40 mg total) by mouth daily before breakfast., Starting Thu 06/10/2016, Until Tue 06/15/2016, Print            This chart was dictated using voice recognition software/Dragon. Despite best efforts to proofread, errors can occur which can change the meaning. Any change was purely unintentional.    Laban Emperor, PA-C 06/10/16 2143    Carrie Mew, MD 06/10/16 2253

## 2016-06-10 NOTE — ED Notes (Addendum)
See triage note  States she developed pain to foot yesterday  Denies any injury or insect sting  States pain is mainly across the top of the foot  Min swelling noted with some redness

## 2016-06-10 NOTE — Discharge Instructions (Signed)
Check blood sugars daily

## 2016-06-10 NOTE — ED Notes (Signed)
Pt ambulatory into hallway, stating "I'm ready to leave." PA [facility] aware

## 2016-06-10 NOTE — ED Triage Notes (Signed)
PT to ED reporting new onset of left foot pain. Swelling, warmth and redness noted to left foot. Pulse is intact. Foot is red in color on the arch. PT reports increased pain with movement and palpation. Pt has hx of gout. Sensation is intact.

## 2016-06-11 DIAGNOSIS — E119 Type 2 diabetes mellitus without complications: Secondary | ICD-10-CM | POA: Diagnosis not present

## 2016-06-18 DIAGNOSIS — E1129 Type 2 diabetes mellitus with other diabetic kidney complication: Secondary | ICD-10-CM | POA: Diagnosis not present

## 2016-06-18 DIAGNOSIS — R809 Proteinuria, unspecified: Secondary | ICD-10-CM | POA: Diagnosis not present

## 2016-06-18 DIAGNOSIS — E119 Type 2 diabetes mellitus without complications: Secondary | ICD-10-CM | POA: Diagnosis not present

## 2016-08-05 ENCOUNTER — Other Ambulatory Visit: Payer: Self-pay | Admitting: Family Medicine

## 2016-09-11 ENCOUNTER — Other Ambulatory Visit: Payer: Self-pay | Admitting: Family Medicine

## 2016-09-22 ENCOUNTER — Other Ambulatory Visit: Payer: Self-pay | Admitting: Family Medicine

## 2016-09-22 DIAGNOSIS — E7849 Other hyperlipidemia: Secondary | ICD-10-CM

## 2016-09-22 DIAGNOSIS — I10 Essential (primary) hypertension: Secondary | ICD-10-CM

## 2016-09-22 DIAGNOSIS — E119 Type 2 diabetes mellitus without complications: Secondary | ICD-10-CM

## 2016-09-30 ENCOUNTER — Other Ambulatory Visit: Payer: Self-pay | Admitting: Family Medicine

## 2016-10-06 ENCOUNTER — Encounter: Payer: Self-pay | Admitting: Family Medicine

## 2016-10-06 ENCOUNTER — Ambulatory Visit (INDEPENDENT_AMBULATORY_CARE_PROVIDER_SITE_OTHER): Payer: Medicare Other | Admitting: Family Medicine

## 2016-10-06 VITALS — BP 122/84 | HR 64 | Resp 16 | Ht 66.0 in | Wt 179.2 lb

## 2016-10-06 DIAGNOSIS — E119 Type 2 diabetes mellitus without complications: Secondary | ICD-10-CM

## 2016-10-06 DIAGNOSIS — M1710 Unilateral primary osteoarthritis, unspecified knee: Secondary | ICD-10-CM

## 2016-10-06 DIAGNOSIS — M1712 Unilateral primary osteoarthritis, left knee: Secondary | ICD-10-CM | POA: Insufficient documentation

## 2016-10-06 DIAGNOSIS — E784 Other hyperlipidemia: Secondary | ICD-10-CM

## 2016-10-06 DIAGNOSIS — I10 Essential (primary) hypertension: Secondary | ICD-10-CM

## 2016-10-06 DIAGNOSIS — Z23 Encounter for immunization: Secondary | ICD-10-CM | POA: Diagnosis not present

## 2016-10-06 DIAGNOSIS — E7849 Other hyperlipidemia: Secondary | ICD-10-CM

## 2016-10-06 MED ORDER — SIMVASTATIN 20 MG PO TABS
20.0000 mg | ORAL_TABLET | Freq: Every day | ORAL | 1 refills | Status: DC
Start: 2016-10-06 — End: 2017-03-21

## 2016-10-06 MED ORDER — METFORMIN HCL 500 MG PO TABS: 500.0000 mg | ORAL_TABLET | Freq: Two times a day (BID) | ORAL | 1 refills | Status: DC

## 2016-10-06 MED ORDER — GLIPIZIDE 5 MG PO TABS: 5.0000 mg | ORAL_TABLET | Freq: Two times a day (BID) | ORAL | 1 refills | Status: DC

## 2016-10-06 MED ORDER — LISINOPRIL-HYDROCHLOROTHIAZIDE 20-25 MG PO TABS: 1.0000 | ORAL_TABLET | Freq: Every day | ORAL | 1 refills | Status: DC

## 2016-10-06 MED ORDER — DILTIAZEM HCL ER 240 MG PO CP24: 240.0000 mg | ORAL_CAPSULE | Freq: Every day | ORAL | 1 refills | Status: DC

## 2016-10-06 NOTE — Progress Notes (Signed)
Name: April [name]   MRN: [id number]    DOB: August 23, 1940   Date:10/06/2016       Progress Note  Subjective  Chief Complaint  Chief Complaint  Patient presents with  . Hyperlipidemia  . Diabetes    Range 79-130  . Hypertension  . Knee Pain    left knee     Hyperlipidemia  This is a chronic problem. The current episode started more than 1 year ago. The problem is controlled. Recent lipid tests were reviewed and are normal. Exacerbating diseases include diabetes. She has no history of chronic renal disease, hypothyroidism, liver disease, obesity or nephrotic syndrome. Factors aggravating her hyperlipidemia include thiazides. Pertinent negatives include no chest pain, focal sensory loss, focal weakness, leg pain, myalgias or shortness of breath. Current antihyperlipidemic treatment includes statins. The current treatment provides moderate improvement of lipids. There are no compliance problems.  Risk factors for coronary artery disease include diabetes mellitus, dyslipidemia and obesity.  Diabetes  She presents for her follow-up diabetic visit. She has type 2 diabetes mellitus. Her disease course has been stable. There are no hypoglycemic associated symptoms. Pertinent negatives for hypoglycemia include no dizziness, headaches or nervousness/anxiousness. There are no diabetic associated symptoms. Pertinent negatives for diabetes include no blurred vision, no chest pain, no polydipsia and no weight loss. There are no hypoglycemic complications. Symptoms are stable. Pertinent negatives for diabetic complications include no autonomic neuropathy, CVA, heart disease, impotence, nephropathy, peripheral neuropathy, PVD or retinopathy. Risk factors for coronary artery disease include diabetes mellitus, dyslipidemia, obesity, post-menopausal and hypertension. Current diabetic treatment includes oral agent (dual therapy). She is following a generally healthy diet. When asked about meal planning, she reported  none. She has not had a previous visit with a dietitian. Her home blood glucose trend is fluctuating minimally. Her breakfast blood glucose is taken between 8-9 am. Her breakfast blood glucose range is generally 90-110 mg/dl. An ACE inhibitor/angiotensin II receptor blocker is being taken. She does not see a podiatrist.Eye exam is current (next week).  Hypertension  This is a chronic problem. The current episode started more than 1 year ago. The problem is unchanged. The problem is controlled. Pertinent negatives include no blurred vision, chest pain, headaches, malaise/fatigue, neck pain, palpitations, peripheral edema, PND or shortness of breath. There are no associated agents to hypertension. Past treatments include diuretics and ACE inhibitors. The current treatment provides moderate improvement. There is no history of angina, kidney disease, CAD/MI, CVA, heart failure, left ventricular hypertrophy, PVD or retinopathy. There is no history of chronic renal disease, a hypertension causing med or renovascular disease.  Knee Pain   There was no injury mechanism. The pain is present in the left knee. The quality of the pain is described as aching. The pain is at a severity of 6/10. The pain is moderate. The pain has been worsening since onset. Pertinent negatives include no inability to bear weight, muscle weakness, numbness or tingling. The symptoms are aggravated by movement and weight bearing. She has tried acetaminophen for the symptoms. The treatment provided no relief.    No problem-specific Assessment & Plan notes found for this encounter.   Past Medical History:  Diagnosis Date  . Diabetes mellitus without complication ([place])   . Hypertension     Past Surgical History:  Procedure Laterality Date  . APPENDECTOMY    . gallstones  06/12/2009    Family History  Problem Relation Age of Onset  . Breast cancer Neg Hx     Social  History   Social History  . Marital status: Widowed    Spouse  name: N/A  . Number of children: N/A  . Years of education: N/A   Occupational History  . Not on file.   Social History Main Topics  . Smoking status: Former Research scientist (life sciences)  . Smokeless tobacco: Never Used  . Alcohol use No  . Drug use: No  . Sexual activity: Not Currently   Other Topics Concern  . Not on file   Social History Narrative  . No narrative on file    No Known Allergies  Outpatient Medications Prior to Visit  Medication Sig Dispense Refill  . ACCU-CHEK AVIVA PLUS test strip TEST BLOOD SUGAR DAILY AS DIRECTED 100 each 6  . aspirin 81 MG tablet Take 1 tablet by mouth daily.    . cyclobenzaprine (FLEXERIL) 10 MG tablet TAKE 1 TABLET(10 MG) BY MOUTH AT BEDTIME 30 tablet 0  . glucose blood (ACCU-CHEK AVIVA PLUS) test strip USE AS DIRECTED TO TEST BLOOD SUGAR DAILY- E11.9 100 each 1  . naproxen (NAPROSYN) 500 MG tablet TAKE 1 TABLET BY MOUTH TWICE DAILY WITH MEALS 30 tablet 0  . DILT-XR 240 MG 24 hr capsule TAKE 1 CAPSULE DAILY 90 capsule 0  . metFORMIN (GLUCOPHAGE) 500 MG tablet TAKE 1 TABLET TWICE A DAY 180 tablet 0  . simvastatin (ZOCOR) 20 MG tablet TAKE 1 TABLET DAILY 90 tablet 0  . glipiZIDE (GLUCOTROL) 5 MG tablet Take 1 tablet (5 mg total) by mouth 2 (two) times daily. 180 tablet 1  . lisinopril-hydrochlorothiazide (PRINZIDE,ZESTORETIC) 20-25 MG tablet TAKE 1 TABLET DAILY 90 tablet 0   No facility-administered medications prior to visit.     Review of Systems  Constitutional: Negative for chills, fever, malaise/fatigue and weight loss.  HENT: Negative for ear discharge, ear pain and sore throat.   Eyes: Negative for blurred vision.  Respiratory: Negative for cough, sputum production, shortness of breath and wheezing.   Cardiovascular: Negative for chest pain, palpitations, leg swelling and PND.  Gastrointestinal: Negative for abdominal pain, blood in stool, constipation, diarrhea, heartburn, melena and nausea.  Genitourinary: Negative for dysuria, frequency,  hematuria, impotence and urgency.  Musculoskeletal: Negative for back pain, joint pain, myalgias and neck pain.  Skin: Negative for rash.  Neurological: Negative for dizziness, tingling, sensory change, focal weakness, numbness and headaches.  Endo/Heme/Allergies: Negative for environmental allergies and polydipsia. Does not bruise/bleed easily.  Psychiatric/Behavioral: Negative for depression and suicidal ideas. The patient is not nervous/anxious and does not have insomnia.      Objective  Vitals:   10/06/16 0817  BP: 122/84  Pulse: 64  Resp: 16  SpO2: 98%  Weight: 179 lb 3.2 oz (81.3 kg)  Height: 5\' 6"  (1.676 m)    Physical Exam  Constitutional: She is well-developed, well-nourished, and in no distress. No distress.  HENT:  Head: Normocephalic and atraumatic.  Right Ear: External ear normal.  Left Ear: External ear normal.  Nose: Nose normal.  Mouth/Throat: Oropharynx is clear and moist.  Eyes: Pupils are equal, round, and reactive to light. Conjunctivae and EOM are normal. Right eye exhibits no discharge. Left eye exhibits no discharge.  Neck: Normal range of motion. Neck supple. No JVD present. No thyromegaly present.  Cardiovascular: Normal rate, regular rhythm, normal heart sounds and intact distal pulses.  Exam reveals no gallop, no S3, no S4, no distant heart sounds and no friction rub.   No murmur heard. Pulmonary/Chest: Effort normal and breath sounds normal. She has  no wheezes. She has no rales.  Abdominal: Soft. Bowel sounds are normal. She exhibits no mass. There is no tenderness. There is no guarding.  Musculoskeletal: Normal range of motion. She exhibits no edema.  Lymphadenopathy:    She has no cervical adenopathy.  Neurological: She is alert. She has normal sensation, normal strength, normal reflexes and intact cranial nerves. No cranial nerve deficit.  Monofilament normal  Skin: Skin is warm and dry. She is not diaphoretic.  Psychiatric: Mood and affect  normal.  Nursing note and vitals reviewed.     Assessment & Plan  Problem List Items Addressed This Visit      Cardiovascular and Mediastinum   Essential hypertension   Relevant Medications   simvastatin (ZOCOR) 20 MG tablet   lisinopril-hydrochlorothiazide (PRINZIDE,ZESTORETIC) 20-25 MG tablet   diltiazem (DILT-XR) 240 MG 24 hr capsule   Other Relevant Orders   Renal Function Panel     Endocrine   Type 2 diabetes mellitus without complications (HCC) - Primary   Relevant Medications   simvastatin (ZOCOR) 20 MG tablet   metFORMIN (GLUCOPHAGE) 500 MG tablet   lisinopril-hydrochlorothiazide (PRINZIDE,ZESTORETIC) 20-25 MG tablet   glipiZIDE (GLUCOTROL) 5 MG tablet   Other Relevant Orders   Hemoglobin A1c   Renal Function Panel   Lipid Profile   Microalbumin / creatinine urine ratio     Musculoskeletal and Integument   Primary osteoarthritis of knee   Relevant Orders   Ambulatory referral to Orthopedic Surgery     Other   Familial multiple lipoprotein-type hyperlipidemia   Relevant Medications   simvastatin (ZOCOR) 20 MG tablet   lisinopril-hydrochlorothiazide (PRINZIDE,ZESTORETIC) 20-25 MG tablet   diltiazem (DILT-XR) 240 MG 24 hr capsule   Other Relevant Orders   Lipid Profile    Other Visit Diagnoses    Need for pneumococcal vaccine       Relevant Orders   Pneumococcal conjugate vaccine 13-valent (Completed)   Need for influenza vaccination       Relevant Orders   Flu vaccine HIGH DOSE PF (Fluzone High dose) (Completed)      Meds ordered this encounter  Medications  . simvastatin (ZOCOR) 20 MG tablet    Sig: Take 1 tablet (20 mg total) by mouth daily.    Dispense:  90 tablet    Refill:  1  . metFORMIN (GLUCOPHAGE) 500 MG tablet    Sig: Take 1 tablet (500 mg total) by mouth 2 (two) times daily.    Dispense:  180 tablet    Refill:  1    sched appt  . lisinopril-hydrochlorothiazide (PRINZIDE,ZESTORETIC) 20-25 MG tablet    Sig: Take 1 tablet by mouth  daily.    Dispense:  90 tablet    Refill:  1  . glipiZIDE (GLUCOTROL) 5 MG tablet    Sig: Take 1 tablet (5 mg total) by mouth 2 (two) times daily before a meal.    Dispense:  180 tablet    Refill:  1  . diltiazem (DILT-XR) 240 MG 24 hr capsule    Sig: Take 1 capsule (240 mg total) by mouth daily.    Dispense:  90 capsule    Refill:  1      Dr. Otilio Miu [facility]  10/06/16

## 2016-10-07 LAB — MICROALBUMIN / CREATININE URINE RATIO
Creatinine, Urine: 102.4 mg/dL
Microalb/Creat Ratio: 8.6 mg/g creat (ref 0.0–30.0)
Microalbumin, Urine: 8.8 ug/mL

## 2016-10-07 LAB — RENAL FUNCTION PANEL
Albumin: 4.6 g/dL (ref 3.5–4.8)
BUN/Creatinine Ratio: 22 (ref 12–28)
BUN: 22 mg/dL (ref 8–27)
CO2: 25 mmol/L (ref 20–29)
Calcium: 9.8 mg/dL (ref 8.7–10.3)
Chloride: 101 mmol/L (ref 96–106)
Creatinine, Ser: 1.01 mg/dL — ABNORMAL HIGH (ref 0.57–1.00)
GFR calc Af Amer: 62 mL/min/{1.73_m2} (ref >59)
GFR calc non Af Amer: 54 mL/min/{1.73_m2} — ABNORMAL LOW (ref >59)
Glucose: 69 mg/dL (ref 65–99)
Phosphorus: 3.1 mg/dL (ref 2.5–4.5)
Potassium: 4.3 mmol/L (ref 3.5–5.2)
Sodium: 144 mmol/L (ref 134–144)

## 2016-10-07 LAB — LIPID PANEL
Chol/HDL Ratio: 3.2 ratio (ref 0.0–4.4)
Cholesterol, Total: 140 mg/dL (ref 100–199)
HDL: 44 mg/dL (ref >39)
LDL Calculated: 79 mg/dL (ref 0–99)
Triglycerides: 86 mg/dL (ref 0–149)
VLDL Cholesterol Cal: 17 mg/dL (ref 5–40)

## 2016-10-07 LAB — HEMOGLOBIN A1C
Est. average glucose Bld gHb Est-mCnc: 209 mg/dL
Hgb A1c MFr Bld: 8.9 % — ABNORMAL HIGH (ref 4.8–5.6)

## 2016-10-18 ENCOUNTER — Other Ambulatory Visit: Payer: Self-pay | Admitting: Family Medicine

## 2016-10-21 ENCOUNTER — Other Ambulatory Visit: Payer: Self-pay | Admitting: Family Medicine

## 2016-11-16 ENCOUNTER — Other Ambulatory Visit: Payer: Self-pay | Admitting: Family Medicine

## 2016-11-23 ENCOUNTER — Other Ambulatory Visit: Payer: Self-pay

## 2016-11-23 DIAGNOSIS — Z1239 Encounter for other screening for malignant neoplasm of breast: Secondary | ICD-10-CM

## 2016-12-14 ENCOUNTER — Ambulatory Visit: Payer: Medicare Other

## 2016-12-15 ENCOUNTER — Ambulatory Visit
Admission: RE | Admit: 2016-12-15 | Discharge: 2016-12-15 | Disposition: A | Discharge status: Home / Self Care | Payer: Medicare Other | Source: Ambulatory Visit | Attending: Family Medicine | Admitting: Family Medicine

## 2016-12-15 ENCOUNTER — Other Ambulatory Visit: Payer: Medicare Other

## 2016-12-15 DIAGNOSIS — Z1231 Encounter for screening mammogram for malignant neoplasm of breast: Secondary | ICD-10-CM | POA: Diagnosis not present

## 2016-12-15 DIAGNOSIS — E119 Type 2 diabetes mellitus without complications: Secondary | ICD-10-CM

## 2016-12-15 DIAGNOSIS — Z1239 Encounter for other screening for malignant neoplasm of breast: Secondary | ICD-10-CM

## 2016-12-16 LAB — HEMOGLOBIN A1C
Est. average glucose Bld gHb Est-mCnc: 214 mg/dL
Hgb A1c MFr Bld: 9.1 % — ABNORMAL HIGH (ref 4.8–5.6)

## 2016-12-17 ENCOUNTER — Other Ambulatory Visit: Payer: Self-pay

## 2016-12-17 DIAGNOSIS — E119 Type 2 diabetes mellitus without complications: Secondary | ICD-10-CM

## 2016-12-20 ENCOUNTER — Other Ambulatory Visit: Payer: Self-pay | Admitting: Family Medicine

## 2016-12-20 ENCOUNTER — Other Ambulatory Visit: Payer: Self-pay

## 2016-12-20 DIAGNOSIS — E119 Type 2 diabetes mellitus without complications: Secondary | ICD-10-CM

## 2016-12-20 DIAGNOSIS — E7849 Other hyperlipidemia: Secondary | ICD-10-CM

## 2016-12-20 DIAGNOSIS — I10 Essential (primary) hypertension: Secondary | ICD-10-CM

## 2016-12-20 MED ORDER — GLIPIZIDE 5 MG PO TABS: 5.0000 mg | ORAL_TABLET | Freq: Two times a day (BID) | ORAL | 1 refills | Status: DC

## 2016-12-24 DIAGNOSIS — E119 Type 2 diabetes mellitus without complications: Secondary | ICD-10-CM | POA: Diagnosis not present

## 2016-12-26 ENCOUNTER — Other Ambulatory Visit: Payer: Self-pay

## 2016-12-26 ENCOUNTER — Emergency Department
Admission: EM | Admit: 2016-12-26 | Discharge: 2016-12-26 | Disposition: A | Discharge status: Home / Self Care | Payer: Medicare Other | Attending: Emergency Medicine | Admitting: Emergency Medicine

## 2016-12-26 ENCOUNTER — Encounter: Payer: Self-pay | Admitting: Emergency Medicine

## 2016-12-26 DIAGNOSIS — Z87891 Personal history of nicotine dependence: Secondary | ICD-10-CM | POA: Diagnosis not present

## 2016-12-26 DIAGNOSIS — J029 Acute pharyngitis, unspecified: Secondary | ICD-10-CM | POA: Diagnosis not present

## 2016-12-26 DIAGNOSIS — Z7982 Long term (current) use of aspirin: Secondary | ICD-10-CM | POA: Diagnosis not present

## 2016-12-26 DIAGNOSIS — Z7984 Long term (current) use of oral hypoglycemic drugs: Secondary | ICD-10-CM | POA: Diagnosis not present

## 2016-12-26 DIAGNOSIS — E119 Type 2 diabetes mellitus without complications: Secondary | ICD-10-CM | POA: Diagnosis not present

## 2016-12-26 DIAGNOSIS — I10 Essential (primary) hypertension: Secondary | ICD-10-CM | POA: Diagnosis not present

## 2016-12-26 DIAGNOSIS — Z79899 Other long term (current) drug therapy: Secondary | ICD-10-CM | POA: Diagnosis not present

## 2016-12-26 MED ORDER — LIDOCAINE VISCOUS 2 % MT SOLN: 10.0000 mL | OROMUCOSAL | 0 refills | Status: DC | PRN

## 2016-12-26 MED ORDER — LIDOCAINE VISCOUS 2 % MT SOLN
15.0000 mL | Freq: Once | OROMUCOSAL | Status: AC
  Administered 2016-12-26: 15 mL via OROMUCOSAL
  Filled 2016-12-26: qty 15

## 2016-12-26 NOTE — ED Notes (Addendum)
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 States the sore throat started Friday and has not used any OTC treatments.  Patient states her sore throat make it difficult to swallow. Patient does not indicate any other symptoms.  Tongue has scattered white coloration.

## 2016-12-26 NOTE — ED Triage Notes (Signed)
Arrives with C/O sore throat since Friday.  Denies cough or fever.

## 2016-12-26 NOTE — ED Provider Notes (Signed)
[facility] Emergency Department Provider Note  ____________________________________________  Time seen: Approximately 8:39 AM  I have reviewed the triage vital signs and the nursing notes.   HISTORY  Chief Complaint Sore Throat    HPI April [name] is a 76 y.o. female that presents to the emergency department for evaluation of sore throat and post nasal drip for 2 days.  No alleviating measures have been attempted.  No sick contacts.  No fever, chills, congestion, cough, shortness of breath, chest pain, nausea, vomiting, abdominal pain.   Past Medical History:  Diagnosis Date  . Diabetes mellitus without complication ([place])   . Hypertension     Patient Active Problem List   Diagnosis Date Noted  . Primary osteoarthritis of knee 10/06/2016  . Type 2 diabetes mellitus without complications ([place]) 28/31/5176  . 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 Essential hypertension 06/20/2014  . Routine sports examination 06/20/2014  . Health examination of defined subpopulation 06/20/2014    Past Surgical History:  Procedure Laterality Date  . APPENDECTOMY    . gallstones  06/12/2009    Prior to Admission medications   Medication Sig Start Date End Date Taking? Authorizing Provider  ACCU-CHEK AVIVA PLUS test strip TEST BLOOD SUGAR DAILY AS DIRECTED 04/22/16   Juline Patch, MD  aspirin 81 MG tablet Take 1 tablet by mouth daily.    [provider]  cyclobenzaprine (FLEXERIL) 10 MG tablet TAKE 1 TABLET BY MOUTH AT BEDTIME 12/21/16   Juline Patch, MD  DILT-XR 240 MG 24 hr capsule TAKE 1 CAPSULE DAILY 12/20/16   Juline Patch, MD  diltiazem (DILT-XR) 240 MG 24 hr capsule Take 1 capsule (240 mg total) by mouth daily. 10/06/16   Juline Patch, MD  glipiZIDE (GLUCOTROL) 5 MG tablet Take 1 tablet (5 mg total) 2 (two) times daily before a meal by mouth. 12/20/16   Juline Patch, MD  glucose  blood (ACCU-CHEK AVIVA PLUS) test strip USE AS DIRECTED TO TEST BLOOD SUGAR DAILY- E11.9 04/09/16   Juline Patch, MD  lidocaine (XYLOCAINE) 2 % solution Use as directed 10 mLs as needed in the mouth or throat for mouth pain. 12/26/16   Laban Emperor, PA-C  lisinopril-hydrochlorothiazide (PRINZIDE,ZESTORETIC) 20-25 MG tablet Take 1 tablet by mouth daily. 10/06/16   Juline Patch, MD  lisinopril-hydrochlorothiazide (PRINZIDE,ZESTORETIC) 20-25 MG tablet TAKE 1 TABLET DAILY 12/20/16   Juline Patch, MD  metFORMIN (GLUCOPHAGE) 500 MG tablet Take 1 tablet (500 mg total) by mouth 2 (two) times daily. 10/06/16   Juline Patch, MD  metFORMIN (GLUCOPHAGE) 500 MG tablet TAKE 1 TABLET TWICE A DAY. PLEASE MAKE AN APPOINTMENT WITH YOUR DOCTOR 12/20/16   Juline Patch, MD  naproxen (NAPROSYN) 500 MG tablet TAKE 1 TABLET BY MOUTH TWICE DAILY WITH MEALS 12/20/16   Juline Patch, MD  simvastatin (ZOCOR) 20 MG tablet Take 1 tablet (20 mg total) by mouth daily. 10/06/16   Juline Patch, MD  simvastatin (ZOCOR) 20 MG tablet TAKE 1 TABLET DAILY 12/20/16   Juline Patch, MD    Allergies Patient has no known allergies.  Family History  Problem Relation Age of Onset  . Breast cancer Neg Hx     Social History Social History   Tobacco Use  . Smoking status: Former Research scientist (life sciences)  . Smokeless tobacco: Never Used  Substance Use Topics  . Alcohol use: No    Alcohol/week: 0.0 oz  .  Drug use: No     Review of Systems  Constitutional: No fever/chills ENT: Negative for congestion and rhinorrhea. Cardiovascular: No chest pain. Respiratory: Negative for cough. No SOB. Gastrointestinal: No abdominal pain.  No nausea, no vomiting.  No diarrhea.  No constipation. Musculoskeletal: Negative for musculoskeletal pain. Skin: Negative for rash, abrasions, lacerations, ecchymosis. Neurological: Negative for headaches.   ____________________________________________   PHYSICAL EXAM:  VITAL SIGNS: ED Triage  Vitals [12/26/16 0819]  Enc Vitals Group     BP (!) 108/55     Pulse Rate 93     Resp 16     Temp 98.5 F (36.9 C)     Temp Source Oral     SpO2 96 %     Weight 190 lb (86.2 kg)     Height 5\' 6"  (1.676 m)     Head Circumference      Peak Flow      Pain Score 7     Pain Loc      Pain Edu?      Excl. in [place]?      Constitutional: Alert and oriented. Well appearing and in no acute distress. Eyes: Conjunctivae are normal. PERRL. EOMI. No discharge. Head: Atraumatic. ENT: No frontal and maxillary sinus tenderness.      Ears: Tympanic membranes pearly gray with good landmarks. No discharge.      Nose: Mild congestion/rhinnorhea.      Mouth/Throat: Mucous membranes are moist. Oropharynx mildly erythematous. Tonsils not enlarged. No exudates. Uvula midline. Neck: No stridor.   Hematological/Lymphatic/Immunilogical: No cervical lymphadenopathy. Cardiovascular: Normal rate, regular rhythm.  Good peripheral circulation. Respiratory: Normal respiratory effort without tachypnea or retractions. Lungs CTAB. Good air entry to the bases with no decreased or absent breath sounds. Gastrointestinal: Bowel sounds 4 quadrants. Soft and nontender to palpation. No guarding or rigidity. No palpable masses. No distention. Musculoskeletal: Full range of motion to all extremities. No gross deformities appreciated. Neurologic:  Normal speech and language. No gross focal neurologic deficits are appreciated.  Skin:  Skin is warm, dry and intact. No rash noted.   ____________________________________________   LABS (all labs ordered are listed, but only abnormal results are displayed)  Labs Reviewed - No data to display ____________________________________________  EKG   ____________________________________________  RADIOLOGY   No results found.  ____________________________________________    PROCEDURES  Procedure(s) performed:    Procedures    Medications  lidocaine (XYLOCAINE) 2  % viscous mouth solution 15 mL (15 mLs Mouth/Throat Given 12/26/16 0901)     ____________________________________________   INITIAL IMPRESSION / ASSESSMENT AND PLAN / ED COURSE  Pertinent labs & imaging results that were available during my care of the patient were reviewed by me and considered in my medical decision making (see chart for details).  Review of the [place] CSRS was performed in accordance of the [place] prior to dispensing any controlled drugs.  Patient's diagnosis is consistent with viral pharyngitis. Vital signs and exam are reassuring.  Strep test negative.  Patient appears well and is staying well hydrated. Patient feels comfortable going home. Patient will be discharged home with prescriptions for viscous lidocaine. Patient is to follow up with PCP as needed or otherwise directed. Patient is given ED precautions to return to the ED for any worsening or new symptoms.   ____________________________________________  FINAL CLINICAL IMPRESSION(S) / ED DIAGNOSES  Final diagnoses:  Pharyngitis, unspecified etiology      NEW MEDICATIONS STARTED DURING THIS VISIT:  This SmartLink is deprecated. Use AVSMEDLIST instead  to display the medication list for a patient.      This chart was dictated using voice recognition software/Dragon. Despite best efforts to proofread, errors can occur which can change the meaning. Any change was purely unintentional.    Laban Emperor, PA-C 12/26/16 1026    Delman Kitten, MD 12/28/16 [phone]

## 2016-12-26 NOTE — ED Notes (Signed)
Patient does not appear to be in any acute distress at time of discharge. Patient ambulatory to lobby with steady gate. Patient denies any comments or concerns regarding discharge.  

## 2017-01-16 ENCOUNTER — Other Ambulatory Visit: Payer: Self-pay | Admitting: Family Medicine

## 2017-01-18 ENCOUNTER — Other Ambulatory Visit: Payer: Medicare Other

## 2017-01-18 DIAGNOSIS — E119 Type 2 diabetes mellitus without complications: Secondary | ICD-10-CM

## 2017-01-18 NOTE — Progress Notes (Signed)
Pt not seen by doctor

## 2017-01-19 ENCOUNTER — Other Ambulatory Visit: Payer: Self-pay | Admitting: Family Medicine

## 2017-01-20 DIAGNOSIS — E119 Type 2 diabetes mellitus without complications: Secondary | ICD-10-CM | POA: Diagnosis not present

## 2017-01-21 ENCOUNTER — Other Ambulatory Visit: Payer: Self-pay

## 2017-01-21 DIAGNOSIS — E119 Type 2 diabetes mellitus without complications: Secondary | ICD-10-CM

## 2017-01-21 LAB — HEMOGLOBIN A1C
Est. average glucose Bld gHb Est-mCnc: 189 mg/dL
Hgb A1c MFr Bld: 8.2 % — ABNORMAL HIGH (ref 4.8–5.6)

## 2017-01-24 DIAGNOSIS — E119 Type 2 diabetes mellitus without complications: Secondary | ICD-10-CM | POA: Diagnosis not present

## 2017-03-02 DIAGNOSIS — B351 Tinea unguium: Secondary | ICD-10-CM | POA: Diagnosis not present

## 2017-03-02 DIAGNOSIS — M79672 Pain in left foot: Secondary | ICD-10-CM | POA: Diagnosis not present

## 2017-03-02 DIAGNOSIS — M79671 Pain in right foot: Secondary | ICD-10-CM | POA: Diagnosis not present

## 2017-03-20 ENCOUNTER — Other Ambulatory Visit: Payer: Self-pay | Admitting: Family Medicine

## 2017-03-20 DIAGNOSIS — I10 Essential (primary) hypertension: Secondary | ICD-10-CM

## 2017-03-20 DIAGNOSIS — E119 Type 2 diabetes mellitus without complications: Secondary | ICD-10-CM

## 2017-03-20 DIAGNOSIS — E7849 Other hyperlipidemia: Secondary | ICD-10-CM

## 2017-03-21 ENCOUNTER — Other Ambulatory Visit: Payer: Self-pay

## 2017-03-21 ENCOUNTER — Emergency Department: Payer: Medicare HMO

## 2017-03-21 ENCOUNTER — Emergency Department
Admission: EM | Admit: 2017-03-21 | Discharge: 2017-03-21 | Disposition: A | Discharge status: Home / Self Care | Payer: Medicare HMO | Attending: Emergency Medicine | Admitting: Emergency Medicine

## 2017-03-21 ENCOUNTER — Encounter: Payer: Self-pay | Admitting: Medical Oncology

## 2017-03-21 DIAGNOSIS — M25561 Pain in right knee: Secondary | ICD-10-CM | POA: Diagnosis not present

## 2017-03-21 DIAGNOSIS — S8002XA Contusion of left knee, initial encounter: Secondary | ICD-10-CM

## 2017-03-21 DIAGNOSIS — S8012XA Contusion of left lower leg, initial encounter: Secondary | ICD-10-CM

## 2017-03-21 DIAGNOSIS — M1990 Unspecified osteoarthritis, unspecified site: Secondary | ICD-10-CM | POA: Insufficient documentation

## 2017-03-21 DIAGNOSIS — I10 Essential (primary) hypertension: Secondary | ICD-10-CM | POA: Insufficient documentation

## 2017-03-21 DIAGNOSIS — Z7984 Long term (current) use of oral hypoglycemic drugs: Secondary | ICD-10-CM | POA: Diagnosis not present

## 2017-03-21 DIAGNOSIS — S8001XA Contusion of right knee, initial encounter: Secondary | ICD-10-CM

## 2017-03-21 DIAGNOSIS — S4992XA Unspecified injury of left shoulder and upper arm, initial encounter: Secondary | ICD-10-CM | POA: Diagnosis not present

## 2017-03-21 DIAGNOSIS — Y939 Activity, unspecified: Secondary | ICD-10-CM | POA: Insufficient documentation

## 2017-03-21 DIAGNOSIS — Z79899 Other long term (current) drug therapy: Secondary | ICD-10-CM | POA: Diagnosis not present

## 2017-03-21 DIAGNOSIS — Z7982 Long term (current) use of aspirin: Secondary | ICD-10-CM | POA: Diagnosis not present

## 2017-03-21 DIAGNOSIS — S161XXA Strain of muscle, fascia and tendon at neck level, initial encounter: Secondary | ICD-10-CM

## 2017-03-21 DIAGNOSIS — M159 Polyosteoarthritis, unspecified: Secondary | ICD-10-CM

## 2017-03-21 DIAGNOSIS — S8992XA Unspecified injury of left lower leg, initial encounter: Secondary | ICD-10-CM | POA: Diagnosis not present

## 2017-03-21 DIAGNOSIS — Y92410 Unspecified street and highway as the place of occurrence of the external cause: Secondary | ICD-10-CM | POA: Diagnosis not present

## 2017-03-21 DIAGNOSIS — I252 Old myocardial infarction: Secondary | ICD-10-CM | POA: Insufficient documentation

## 2017-03-21 DIAGNOSIS — E119 Type 2 diabetes mellitus without complications: Secondary | ICD-10-CM | POA: Diagnosis not present

## 2017-03-21 DIAGNOSIS — M25512 Pain in left shoulder: Secondary | ICD-10-CM | POA: Diagnosis not present

## 2017-03-21 DIAGNOSIS — M25511 Pain in right shoulder: Secondary | ICD-10-CM | POA: Diagnosis not present

## 2017-03-21 DIAGNOSIS — M25562 Pain in left knee: Secondary | ICD-10-CM | POA: Diagnosis not present

## 2017-03-21 DIAGNOSIS — Y998 Other external cause status: Secondary | ICD-10-CM | POA: Insufficient documentation

## 2017-03-21 DIAGNOSIS — S199XXA Unspecified injury of neck, initial encounter: Secondary | ICD-10-CM | POA: Diagnosis not present

## 2017-03-21 DIAGNOSIS — M542 Cervicalgia: Secondary | ICD-10-CM | POA: Diagnosis not present

## 2017-03-21 DIAGNOSIS — S8991XA Unspecified injury of right lower leg, initial encounter: Secondary | ICD-10-CM | POA: Diagnosis not present

## 2017-03-21 LAB — GLUCOSE, CAPILLARY: Glucose-Capillary: 146 mg/dL — ABNORMAL HIGH (ref 65–99)

## 2017-03-21 MED ORDER — TRAMADOL HCL 50 MG PO TABS: ORAL_TABLET | ORAL | 0 refills | Status: DC
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
## 2017-03-21 NOTE — Discharge Instructions (Signed)
Call and make an appointment with Dr. Ronnald Ramp if any continued problems.  Take pain medication as needed for pain.  1 tablet every 6-8 hours.  Take this medication with food.  This medication could cause drowsiness and increase your risk for falling.  You will be sore and stiff most likely for the next 4-5 days.
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
## 2017-03-24 ENCOUNTER — Other Ambulatory Visit: Payer: Self-pay

## 2017-03-24 DIAGNOSIS — M62830 Muscle spasm of back: Secondary | ICD-10-CM

## 2017-03-24 MED ORDER — CYCLOBENZAPRINE HCL 5 MG PO TABS
5.0000 mg | ORAL_TABLET | Freq: Every day | ORAL | 0 refills | Status: DC
Start: 2017-03-24 — End: 2017-05-06

## 2017-04-06 ENCOUNTER — Other Ambulatory Visit: Payer: Medicare HMO

## 2017-04-06 ENCOUNTER — Other Ambulatory Visit: Payer: Self-pay

## 2017-04-06 DIAGNOSIS — E782 Mixed hyperlipidemia: Secondary | ICD-10-CM | POA: Diagnosis not present

## 2017-04-06 DIAGNOSIS — E1165 Type 2 diabetes mellitus with hyperglycemia: Secondary | ICD-10-CM | POA: Diagnosis not present

## 2017-04-07 ENCOUNTER — Ambulatory Visit (INDEPENDENT_AMBULATORY_CARE_PROVIDER_SITE_OTHER): Payer: Medicare HMO | Admitting: Family Medicine

## 2017-04-07 ENCOUNTER — Encounter: Payer: Self-pay | Admitting: Family Medicine

## 2017-04-07 VITALS — Ht 66.0 in | Wt 185.0 lb

## 2017-04-07 DIAGNOSIS — E119 Type 2 diabetes mellitus without complications: Secondary | ICD-10-CM | POA: Diagnosis not present

## 2017-04-07 DIAGNOSIS — I10 Essential (primary) hypertension: Secondary | ICD-10-CM | POA: Diagnosis not present

## 2017-04-07 DIAGNOSIS — Z024 Encounter for examination for driving license: Secondary | ICD-10-CM | POA: Diagnosis not present

## 2017-04-07 DIAGNOSIS — E7849 Other hyperlipidemia: Secondary | ICD-10-CM

## 2017-04-07 LAB — LIPID PANEL WITH LDL/HDL RATIO
Cholesterol, Total: 147 mg/dL (ref 100–199)
HDL: 50 mg/dL (ref >39)
LDL Calculated: 80 mg/dL (ref 0–99)
LDl/HDL Ratio: 1.6 ratio (ref 0.0–3.2)
Triglycerides: 86 mg/dL (ref 0–149)
VLDL Cholesterol Cal: 17 mg/dL (ref 5–40)

## 2017-04-07 LAB — HEMOGLOBIN A1C
Est. average glucose Bld gHb Est-mCnc: 177 mg/dL
Hgb A1c MFr Bld: 7.8 % — ABNORMAL HIGH (ref 4.8–5.6)

## 2017-04-07 MED ORDER — GLIPIZIDE 5 MG PO TABS: 5.0000 mg | ORAL_TABLET | Freq: Two times a day (BID) | ORAL | 1 refills | Status: DC

## 2017-04-07 MED ORDER — LISINOPRIL-HYDROCHLOROTHIAZIDE 20-25 MG PO TABS: 1.0000 | ORAL_TABLET | Freq: Every day | ORAL | 1 refills | Status: DC

## 2017-04-07 MED ORDER — SIMVASTATIN 20 MG PO TABS: 20.0000 mg | ORAL_TABLET | Freq: Every day | ORAL | 1 refills | Status: DC

## 2017-04-07 MED ORDER — METFORMIN HCL 500 MG PO TABS: ORAL_TABLET | ORAL | 1 refills | Status: DC

## 2017-04-07 MED ORDER — DILTIAZEM HCL ER 240 MG PO CP24: 240.0000 mg | ORAL_CAPSULE | Freq: Every day | ORAL | 1 refills | Status: DC
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
## 2017-04-25 DIAGNOSIS — E1159 Type 2 diabetes mellitus with other circulatory complications: Secondary | ICD-10-CM | POA: Diagnosis not present

## 2017-04-25 DIAGNOSIS — E785 Hyperlipidemia, unspecified: Secondary | ICD-10-CM | POA: Diagnosis not present

## 2017-04-25 DIAGNOSIS — E1169 Type 2 diabetes mellitus with other specified complication: Secondary | ICD-10-CM | POA: Diagnosis not present

## 2017-04-25 DIAGNOSIS — E119 Type 2 diabetes mellitus without complications: Secondary | ICD-10-CM | POA: Diagnosis not present

## 2017-04-25 DIAGNOSIS — I10 Essential (primary) hypertension: Secondary | ICD-10-CM | POA: Diagnosis not present

## 2017-05-06 ENCOUNTER — Other Ambulatory Visit: Payer: Self-pay | Admitting: Family Medicine

## 2017-05-06 DIAGNOSIS — M62830 Muscle spasm of back: Secondary | ICD-10-CM

## 2017-05-18 ENCOUNTER — Other Ambulatory Visit: Payer: Self-pay | Admitting: Family Medicine

## 2017-05-18 DIAGNOSIS — I10 Essential (primary) hypertension: Secondary | ICD-10-CM

## 2017-05-18 DIAGNOSIS — E7849 Other hyperlipidemia: Secondary | ICD-10-CM

## 2017-05-18 DIAGNOSIS — E119 Type 2 diabetes mellitus without complications: Secondary | ICD-10-CM

## 2017-05-30 ENCOUNTER — Ambulatory Visit
Admission: RE | Admit: 2017-05-30 | Discharge: 2017-05-30 | Disposition: A | Discharge status: Home / Self Care | Payer: Medicare HMO | Source: Ambulatory Visit | Attending: Family Medicine | Admitting: Family Medicine

## 2017-05-30 ENCOUNTER — Ambulatory Visit (INDEPENDENT_AMBULATORY_CARE_PROVIDER_SITE_OTHER): Payer: Medicare HMO | Admitting: Family Medicine

## 2017-05-30 ENCOUNTER — Other Ambulatory Visit
Admission: RE | Admit: 2017-05-30 | Discharge: 2017-05-30 | Disposition: A | Discharge status: Home / Self Care | Payer: Medicare HMO | Source: Ambulatory Visit | Attending: Family Medicine | Admitting: Family Medicine

## 2017-05-30 ENCOUNTER — Ambulatory Visit
Admission: RE | Admit: 2017-05-30 | Discharge: 2017-05-30 | Disposition: A | Discharge status: Home / Self Care | Payer: Medicare HMO | Source: Intra-hospital | Attending: Family Medicine | Admitting: Family Medicine

## 2017-05-30 ENCOUNTER — Encounter: Payer: Self-pay | Admitting: Family Medicine

## 2017-05-30 VITALS — BP 118/62 | HR 112 | Temp 101.2°F | Ht 66.0 in | Wt 174.0 lb

## 2017-05-30 DIAGNOSIS — J40 Bronchitis, not specified as acute or chronic: Secondary | ICD-10-CM | POA: Diagnosis not present

## 2017-05-30 DIAGNOSIS — R918 Other nonspecific abnormal finding of lung field: Secondary | ICD-10-CM | POA: Insufficient documentation

## 2017-05-30 DIAGNOSIS — R509 Fever, unspecified: Secondary | ICD-10-CM | POA: Insufficient documentation

## 2017-05-30 DIAGNOSIS — J4521 Mild intermittent asthma with (acute) exacerbation: Secondary | ICD-10-CM

## 2017-05-30 DIAGNOSIS — R05 Cough: Secondary | ICD-10-CM | POA: Diagnosis not present

## 2017-05-30 DIAGNOSIS — J189 Pneumonia, unspecified organism: Secondary | ICD-10-CM | POA: Diagnosis not present

## 2017-05-30 DIAGNOSIS — R0989 Other specified symptoms and signs involving the circulatory and respiratory systems: Secondary | ICD-10-CM | POA: Diagnosis not present

## 2017-05-30 LAB — CBC WITH DIFFERENTIAL/PLATELET
Basophils Absolute: 0.1 10*3/uL (ref 0–0.1)
Basophils Relative: 1 %
Eosinophils Absolute: 0.1 10*3/uL (ref 0–0.7)
Eosinophils Relative: 1 %
HCT: 33.9 % — ABNORMAL LOW (ref 35.0–47.0)
Hemoglobin: 11.1 g/dL — ABNORMAL LOW (ref 12.0–16.0)
Lymphocytes Relative: 22 %
Lymphs Abs: 2.1 10*3/uL (ref 1.0–3.6)
MCH: 28.6 pg (ref 26.0–34.0)
MCHC: 32.7 g/dL (ref 32.0–36.0)
MCV: 87.7 fL (ref 80.0–100.0)
Monocytes Absolute: 0.7 10*3/uL (ref 0.2–0.9)
Monocytes Relative: 7 %
Neutro Abs: 6.5 10*3/uL (ref 1.4–6.5)
Neutrophils Relative %: 69 %
Platelets: 239 10*3/uL (ref 150–440)
RBC: 3.87 MIL/uL (ref 3.80–5.20)
RDW: 13.8 % (ref 11.5–14.5)
WBC: 9.5 10*3/uL (ref 3.6–11.0)

## 2017-05-30 LAB — POCT INFLUENZA A/B
Influenza A, POC: NEGATIVE
Influenza B, POC: NEGATIVE

## 2017-05-30 MED ORDER — ALBUTEROL SULFATE HFA 108 (90 BASE) MCG/ACT IN AERS
2.0000 | INHALATION_SPRAY | Freq: Four times a day (QID) | RESPIRATORY_TRACT | 2 refills | Status: DC | PRN
Start: 2017-05-30 — End: 2019-07-04

## 2017-05-30 MED ORDER — LEVOFLOXACIN 500 MG PO TABS: 500.0000 mg | ORAL_TABLET | Freq: Every day | ORAL | 0 refills | Status: DC

## 2017-05-30 MED ORDER — ALBUTEROL SULFATE (2.5 MG/3ML) 0.083% IN NEBU: 2.5000 mg | INHALATION_SOLUTION | Freq: Once | RESPIRATORY_TRACT | Status: DC

## 2017-05-30 MED ORDER — GUAIFENESIN-CODEINE 100-10 MG/5ML PO SYRP: 5.0000 mL | ORAL_SOLUTION | Freq: Three times a day (TID) | ORAL | 0 refills | Status: DC | PRN

## 2017-05-30 NOTE — Patient Instructions (Signed)
How to Use a Metered Dose Inhaler A metered dose inhaler is a handheld device for taking medicine that must be breathed into the lungs (inhaled). The device can be used to deliver a variety of inhaled medicines, including:  Quick relief or rescue medicines, such as bronchodilators.  Controller medicines, such as corticosteroids.  The medicine is delivered by pushing down on a metal canister to release a preset amount of spray and medicine. Each device contains the amount of medicine that is needed for a preset number of uses (inhalations). Your health care provider may recommend that you use a spacer with your inhaler to help you take the medicine more effectively. A spacer is a plastic tube with a mouthpiece on one end and an opening that connects to the inhaler on the other end. A spacer holds the medicine in a tube for a short time, which allows you to inhale more medicine. What are the risks? If you do not use your inhaler correctly, medicine might not reach your lungs to help you breathe. Inhaler medicine can cause side effects, such as:  Mouth or throat infection.  Cough.  Hoarseness.  Headache.  Nausea and vomiting.  Lung infection (pneumonia) in people who have a lung condition called COPD.  How to use a metered dose inhaler without a spacer 1. Remove the cap from the inhaler. 2. If you are using the inhaler for the first time, shake it for 5 seconds, turn it away from your face, then release 4 puffs into the air. This is called priming. 3. Shake the inhaler for 5 seconds. 4. Position the inhaler so the top of the canister faces up. 5. Put your index finger on the top of the medicine canister. Support the bottom of the inhaler with your thumb. 6. Breathe out normally and as completely as possible, away from the inhaler. 7. Either place the inhaler between your teeth and close your lips tightly around the mouthpiece, or hold the inhaler 1-2 inches (2.5-5 cm) away from your open  mouth. Keep your tongue down out of the way. If you are unsure which technique to use, ask your health care provider. 8. Press the canister down with your index finger to release the medicine, then inhale deeply and slowly through your mouth (not your nose) until your lungs are completely filled. Inhaling should take 4-6 seconds. 9. Hold the medicine in your lungs for 5-10 seconds (10 seconds is best). This helps the medicine get into the small airways of your lungs. 10. With your lips in a tight circle (pursed), breathe out slowly. 11. Repeat steps 3-10 until you have taken the number of puffs that your health care provider directed. Wait about 1 minute between puffs or as directed. 12. Put the cap on the inhaler. 13. If you are using a steroid inhaler, rinse your mouth with water, gargle, and spit out the water. Do not swallow the water. How to use a metered dose inhaler with a spacer 1. Remove the cap from the inhaler. 2. If you are using the inhaler for the first time, shake it for 5 seconds, turn it away from your face, then release 4 puffs into the air. This is called priming. 3. Shake the inhaler for 5 seconds. 4. Place the open end of the spacer onto the inhaler mouthpiece. 5. Position the inhaler so the top of the canister faces up and the spacer mouthpiece faces you. 6. Put your index finger on the top of the medicine canister.   Support the bottom of the inhaler and the spacer with your thumb. 7. Breathe out normally and as completely as possible, away from the spacer. 8. Place the spacer between your teeth and close your lips tightly around it. Keep your tongue down out of the way. 9. Press the canister down with your index finger to release the medicine, then inhale deeply and slowly through your mouth (not your nose) until your lungs are completely filled. Inhaling should take 4-6 seconds. 10. Hold the medicine in your lungs for 5-10 seconds (10 seconds is best). This helps the medicine  get into the small airways of your lungs. 11. With your lips in a tight circle (pursed), breathe out slowly. 12. Repeat steps 3-11 until you have taken the number of puffs that your health care provider directed. Wait about 1 minute between puffs or as directed. 13. Remove the spacer from the inhaler and put the cap on the inhaler. 14. If you are using a steroid inhaler, rinse your mouth with water, gargle, and spit out the water. Do not swallow the water. Follow these instructions at home:  Take your inhaled medicine only as told by your health care provider. Do not use the inhaler more than directed by your health care provider.  Keep all follow-up visits as told by your health care provider. This is important.  If your inhaler has a counter, you can check it to determine how full your inhaler is. If your inhaler does not have a counter, ask your health care provider when you will need to refill your inhaler and write the refill date on a calendar or on your inhaler canister. Note that you cannot know when an inhaler is empty by shaking it.  Follow directions on the package insert for care and cleaning of your inhaler and spacer. Contact a health care provider if:  Symptoms are only partially relieved with your inhaler.  You are having trouble using your inhaler.  You have an increase in phlegm.  You have headaches. Get help right away if:  You feel little or no relief after using your inhaler.  You have dizziness.  You have a fast heart rate.  You have chills or a fever.  You have night sweats.  There is blood in your phlegm. Summary  A metered dose inhaler is a handheld device for taking medicine that must be breathed into the lungs (inhaled).  The medicine is delivered by pushing down on a metal canister to release a preset amount of spray and medicine.  Each device contains the amount of medicine that is needed for a preset number of uses (inhalations). This  information is not intended to replace advice given to you by your health care provider. Make sure you discuss any questions you have with your health care provider. Document Released: 01/25/2005 Document Revised: 12/16/2015 Document Reviewed: 12/16/2015 Elsevier Interactive Patient Education  2017 [employer]  

## 2017-05-30 NOTE — Progress Notes (Signed)
Name: April [name]   MRN: [id number]    DOB: 1941/01/26   Date:05/30/2017       Progress Note  Subjective  Chief Complaint  Chief Complaint  Patient presents with  . Cough    cong, chills, fever    Cough  This is a new problem. Episode onset: Thursday. The problem has been gradually worsening. The problem occurs every few minutes. The cough is productive of purulent sputum. Associated symptoms include chills, a fever, myalgias, nasal congestion, postnasal drip and rhinorrhea. Pertinent negatives include no chest pain, ear congestion, ear pain, headaches, heartburn, hemoptysis, rash, sore throat, shortness of breath, sweats, weight loss or wheezing. The symptoms are aggravated by lying down. She has tried nothing for the symptoms. Her past medical history is significant for bronchitis and pneumonia. There is no history of environmental allergies.    No problem-specific Assessment & Plan notes found for this encounter.   Past Medical History:  Diagnosis Date  . Diabetes mellitus without complication ([place])   . Hypertension     Past Surgical History:  Procedure Laterality Date  . APPENDECTOMY    . gallstones  06/12/2009    Family History  Problem Relation Age of Onset  . Breast cancer Neg Hx     Social History   Socioeconomic History  . Marital status: Widowed    Spouse name: Not on file  . Number of children: Not on file  . Years of education: Not on file  . Highest education level: Not on file  Occupational History  . Not on file  Social Needs  . Financial resource strain: Not on file  . Food insecurity:    Worry: Not on file    Inability: Not on file  . Transportation needs:    Medical: Not on file    Non-medical: Not on file  Tobacco Use  . Smoking status: Former Research scientist (life sciences)  . Smokeless tobacco: Never Used  Substance and Sexual Activity  . Alcohol use: No    Alcohol/week: 0.0 oz  . Drug use: No  . Sexual activity: Not Currently  Lifestyle  . Physical activity:     Days per week: Not on file    Minutes per session: Not on file  . Stress: Not on file  Relationships  . Social connections:    Talks on phone: Not on file    Gets together: Not on file    Attends religious service: Not on file    Active member of club or organization: Not on file    Attends meetings of clubs or organizations: Not on file    Relationship status: Not on file  . Intimate partner violence:    Fear of current or ex partner: Not on file    Emotionally abused: Not on file    Physically abused: Not on file    Forced sexual activity: Not on file  Other Topics Concern  . Not on file  Social History Narrative  . Not on file    No Known Allergies  Outpatient Medications Prior to Visit  Medication Sig Dispense Refill  . ACCU-CHEK AVIVA PLUS test strip TEST BLOOD SUGAR DAILY AS DIRECTED 100 each 6  . aspirin 81 MG tablet Take 1 tablet by mouth daily.    . cyclobenzaprine (FLEXERIL) 5 MG tablet TAKE 1 TABLET BY MOUTH AT BEDTIME. DO NOT TAKE WITH PAIN MEDICINE 30 tablet 0  . diltiazem (DILT-XR) 240 MG 24 hr capsule Take 1 capsule (240 mg total) by  mouth daily. 90 capsule 1  . glipiZIDE (GLUCOTROL) 5 MG tablet Take 1 tablet (5 mg total) by mouth 2 (two) times daily before a meal. 180 tablet 1  . glucose blood (ACCU-CHEK AVIVA PLUS) test strip USE AS DIRECTED TO TEST BLOOD SUGAR DAILY- E11.9 100 each 1  . lisinopril-hydrochlorothiazide (PRINZIDE,ZESTORETIC) 20-25 MG tablet Take 1 tablet by mouth daily. 90 tablet 1  . metFORMIN (GLUCOPHAGE) 500 MG tablet TAKE 1 TABLET TWICE A DAY. PLEASE MAKE AN APPOINTMENT WITH YOUR DOCTOR 180 tablet 1  . simvastatin (ZOCOR) 20 MG tablet TAKE 1 TABLET EVERY DAY 90 tablet 0  . DILT-XR 240 MG 24 hr capsule TAKE 1 CAPSULE EVERY DAY 90 capsule 0  . lisinopril-hydrochlorothiazide (PRINZIDE,ZESTORETIC) 20-12.5 MG tablet TAKE 1 TABLET EVERY DAY 90 tablet 0  . traMADol (ULTRAM) 50 MG tablet 1 tablet every 6-8 hours as needed for pain. (Patient not  taking: Reported on 05/30/2017) 15 tablet 0   No facility-administered medications prior to visit.     Review of Systems  Constitutional: Positive for chills and fever. Negative for malaise/fatigue and weight loss.  HENT: Positive for postnasal drip and rhinorrhea. Negative for ear discharge, ear pain and sore throat.   Eyes: Negative for blurred vision.  Respiratory: Positive for cough. Negative for hemoptysis, sputum production, shortness of breath and wheezing.   Cardiovascular: Negative for chest pain, palpitations and leg swelling.  Gastrointestinal: Negative for abdominal pain, blood in stool, constipation, diarrhea, heartburn, melena and nausea.  Genitourinary: Negative for dysuria, frequency, hematuria and urgency.  Musculoskeletal: Positive for myalgias. Negative for back pain, joint pain and neck pain.  Skin: Negative for rash.  Neurological: Negative for dizziness, tingling, sensory change, focal weakness and headaches.  Endo/Heme/Allergies: Negative for environmental allergies and polydipsia. Does not bruise/bleed easily.  Psychiatric/Behavioral: Negative for depression and suicidal ideas. The patient is not nervous/anxious and does not have insomnia.      Objective  Vitals:   05/30/17 1139  BP: 118/62  Pulse: (!) 112  Temp: (!) 101.2 F (38.4 C)  TempSrc: Oral  SpO2: 96%  Weight: 174 lb (78.9 kg)  Height: 5\' 6"  (1.676 m)    Physical Exam  Constitutional: No distress.  HENT:  Head: Normocephalic and atraumatic.  Right Ear: External ear normal.  Left Ear: External ear normal.  Nose: Nose normal.  Mouth/Throat: Oropharynx is clear and moist.  Eyes: Pupils are equal, round, and reactive to light. Conjunctivae and EOM are normal. Right eye exhibits no discharge. Left eye exhibits no discharge.  Neck: Normal range of motion. Neck supple. No JVD present. No thyromegaly present.  Cardiovascular: Normal rate, regular rhythm, normal heart sounds and intact distal  pulses. Exam reveals no gallop and no friction rub.  No murmur heard. Pulmonary/Chest: Effort normal. She has wheezes.  Abdominal: Soft. Bowel sounds are normal. She exhibits no mass. There is no tenderness. There is no guarding.  Musculoskeletal: Normal range of motion. She exhibits no edema.  Lymphadenopathy:    She has no cervical adenopathy.  Neurological: She is alert. She has normal reflexes.  Skin: Skin is warm and dry. She is not diaphoretic.  Nursing note and vitals reviewed.     Assessment & Plan  Problem List Items Addressed This Visit    None    Visit Diagnoses    Pneumonia due to infectious organism, unspecified laterality, unspecified part of lung    -  Primary   Relevant Medications   levofloxacin (LEVAQUIN) 500 MG tablet  albuterol (PROVENTIL HFA;VENTOLIN HFA) 108 (90 Base) MCG/ACT inhaler   guaiFENesin-codeine (ROBITUSSIN AC) 100-10 MG/5ML syrup   albuterol (PROVENTIL) (2.5 MG/3ML) 0.083% nebulizer solution 2.5 mg   Other Relevant Orders   DG Chest 2 View   Mild intermittent reactive airway disease with acute exacerbation       Relevant Medications   levofloxacin (LEVAQUIN) 500 MG tablet   albuterol (PROVENTIL HFA;VENTOLIN HFA) 108 (90 Base) MCG/ACT inhaler   guaiFENesin-codeine (ROBITUSSIN AC) 100-10 MG/5ML syrup   albuterol (PROVENTIL) (2.5 MG/3ML) 0.083% nebulizer solution 2.5 mg   Other Relevant Orders   DG Chest 2 View   Fever and chills       Relevant Orders   POCT Influenza A/B (Completed)      Meds ordered this encounter  Medications  . levofloxacin (LEVAQUIN) 500 MG tablet    Sig: Take 1 tablet (500 mg total) by mouth daily.    Dispense:  7 tablet    Refill:  0  . albuterol (PROVENTIL HFA;VENTOLIN HFA) 108 (90 Base) MCG/ACT inhaler    Sig: Inhale 2 puffs into the lungs every 6 (six) hours as needed for wheezing or shortness of breath.    Dispense:  1 Inhaler    Refill:  2  . guaiFENesin-codeine (ROBITUSSIN AC) 100-10 MG/5ML syrup    Sig:  Take 5 mLs by mouth 3 (three) times daily as needed for cough.    Dispense:  150 mL    Refill:  0  . albuterol (PROVENTIL) (2.5 MG/3ML) 0.083% nebulizer solution 2.5 mg  follow up exam after nebulization treatment imrove airwat/pulse ox 98%    Dr. [facility]  05/30/17

## 2017-06-01 ENCOUNTER — Ambulatory Visit (INDEPENDENT_AMBULATORY_CARE_PROVIDER_SITE_OTHER): Payer: Medicare HMO | Admitting: Family Medicine

## 2017-06-01 ENCOUNTER — Encounter: Payer: Self-pay | Admitting: Family Medicine

## 2017-06-01 VITALS — BP 138/60 | HR 78 | Temp 98.0°F | Ht 66.0 in | Wt 173.0 lb

## 2017-06-01 DIAGNOSIS — J181 Lobar pneumonia, unspecified organism: Secondary | ICD-10-CM

## 2017-06-01 DIAGNOSIS — J189 Pneumonia, unspecified organism: Secondary | ICD-10-CM

## 2017-06-01 NOTE — Progress Notes (Signed)
Name: April [name]   MRN: [id number]    DOB: 27-Feb-1940   Date:06/01/2017       Progress Note  Subjective  Chief Complaint  Chief Complaint  Patient presents with  . Follow-up    pneumonia- feeling better    Pneumonia  She complains of cough and sputum production. There is no chest tightness, difficulty breathing, frequent throat clearing, hemoptysis, hoarse voice, shortness of breath or wheezing. The current episode started in the past 7 days. The problem occurs intermittently. The problem has been gradually improving. The cough is productive of purulent sputum and productive. Associated symptoms include malaise/fatigue. Pertinent negatives include no appetite change, chest pain, dyspnea on exertion, ear pain, fever, headaches, heartburn, myalgias, rhinorrhea, sneezing, sore throat, sweats or weight loss. Her symptoms are aggravated by nothing.    No problem-specific Assessment & Plan notes found for this encounter.   Past Medical History:  Diagnosis Date  . Diabetes mellitus without complication ([place])   . Hypertension     Past Surgical History:  Procedure Laterality Date  . APPENDECTOMY    . gallstones  06/12/2009    Family History  Problem Relation Age of Onset  . Breast cancer Neg Hx     Social History   Socioeconomic History  . Marital status: Widowed    Spouse name: Not on file  . Number of children: Not on file  . Years of education: Not on file  . Highest education level: Not on file  Occupational History  . Not on file  Social Needs  . Financial resource strain: Not on file  . Food insecurity:    Worry: Not on file    Inability: Not on file  . Transportation needs:    Medical: Not on file    Non-medical: Not on file  Tobacco Use  . Smoking status: Former Research scientist (life sciences)  . Smokeless tobacco: Never Used  Substance and Sexual Activity  . Alcohol use: No    Alcohol/week: 0.0 oz  . Drug use: No  . Sexual activity: Not Currently  Lifestyle  . Physical activity:     Days per week: Not on file    Minutes per session: Not on file  . Stress: Not on file  Relationships  . Social connections:    Talks on phone: Not on file    Gets together: Not on file    Attends religious service: Not on file    Active member of club or organization: Not on file    Attends meetings of clubs or organizations: Not on file    Relationship status: Not on file  . Intimate partner violence:    Fear of current or ex partner: Not on file    Emotionally abused: Not on file    Physically abused: Not on file    Forced sexual activity: Not on file  Other Topics Concern  . Not on file  Social History Narrative  . Not on file    No Known Allergies  Outpatient Medications Prior to Visit  Medication Sig Dispense Refill  . ACCU-CHEK AVIVA PLUS test strip TEST BLOOD SUGAR DAILY AS DIRECTED 100 each 6  . albuterol (PROVENTIL HFA;VENTOLIN HFA) 108 (90 Base) MCG/ACT inhaler Inhale 2 puffs into the lungs every 6 (six) hours as needed for wheezing or shortness of breath. 1 Inhaler 2  . aspirin 81 MG tablet Take 1 tablet by mouth daily.    . cyclobenzaprine (FLEXERIL) 5 MG tablet TAKE 1 TABLET BY MOUTH AT BEDTIME.  DO NOT TAKE WITH PAIN MEDICINE 30 tablet 0  . diltiazem (DILT-XR) 240 MG 24 hr capsule Take 1 capsule (240 mg total) by mouth daily. 90 capsule 1  . glipiZIDE (GLUCOTROL) 5 MG tablet Take 1 tablet (5 mg total) by mouth 2 (two) times daily before a meal. 180 tablet 1  . glucose blood (ACCU-CHEK AVIVA PLUS) test strip USE AS DIRECTED TO TEST BLOOD SUGAR DAILY- E11.9 100 each 1  . guaiFENesin-codeine (ROBITUSSIN AC) 100-10 MG/5ML syrup Take 5 mLs by mouth 3 (three) times daily as needed for cough. 150 mL 0  . levofloxacin (LEVAQUIN) 500 MG tablet Take 1 tablet (500 mg total) by mouth daily. 7 tablet 0  . lisinopril-hydrochlorothiazide (PRINZIDE,ZESTORETIC) 20-25 MG tablet Take 1 tablet by mouth daily. 90 tablet 1  . metFORMIN (GLUCOPHAGE) 500 MG tablet TAKE 1 TABLET TWICE A  DAY. PLEASE MAKE AN APPOINTMENT WITH YOUR DOCTOR 180 tablet 1  . simvastatin (ZOCOR) 20 MG tablet TAKE 1 TABLET EVERY DAY 90 tablet 0  . traMADol (ULTRAM) 50 MG tablet 1 tablet every 6-8 hours as needed for pain. (Patient not taking: Reported on 05/30/2017) 15 tablet 0   Facility-Administered Medications Prior to Visit  Medication Dose Route Frequency Provider Last Rate Last Dose  . albuterol (PROVENTIL) (2.5 MG/3ML) 0.083% nebulizer solution 2.5 mg  2.5 mg Nebulization Once Juline Patch, MD        Review of Systems  Constitutional: Positive for malaise/fatigue. Negative for appetite change, chills, fever and weight loss.  HENT: Negative for ear discharge, ear pain, hoarse voice, rhinorrhea, sneezing and sore throat.   Eyes: Negative for blurred vision.  Respiratory: Positive for cough and sputum production. Negative for hemoptysis, shortness of breath and wheezing.   Cardiovascular: Negative for chest pain, dyspnea on exertion, palpitations and leg swelling.  Gastrointestinal: Negative for abdominal pain, blood in stool, constipation, diarrhea, heartburn, melena and nausea.  Genitourinary: Negative for dysuria, frequency, hematuria and urgency.  Musculoskeletal: Negative for back pain, joint pain, myalgias and neck pain.       No myalgias  Skin: Negative for rash.  Neurological: Negative for dizziness, tingling, sensory change, focal weakness and headaches.  Endo/Heme/Allergies: Negative for environmental allergies and polydipsia. Does not bruise/bleed easily.  Psychiatric/Behavioral: Negative for depression and suicidal ideas. The patient is not nervous/anxious and does not have insomnia.      Objective  Vitals:   06/01/17 0857  BP: 138/60  Pulse: 78  Temp: 98 F (36.7 C)  SpO2: 98%  Weight: 173 lb (78.5 kg)  Height: 5\' 6"  (1.676 m)    Physical Exam  Constitutional: No distress.  HENT:  Head: Normocephalic and atraumatic.  Right Ear: External ear normal.  Left Ear:  External ear normal.  Nose: Nose normal.  Mouth/Throat: Oropharynx is clear and moist.  Eyes: Pupils are equal, round, and reactive to light. Conjunctivae and EOM are normal. Right eye exhibits no discharge. Left eye exhibits no discharge.  Neck: Normal range of motion. Neck supple. No JVD present. No thyromegaly present.  Cardiovascular: Normal rate, regular rhythm, normal heart sounds and intact distal pulses. Exam reveals no gallop and no friction rub.  No murmur heard. Pulmonary/Chest: Effort normal and breath sounds normal.  Abdominal: Soft. Bowel sounds are normal. She exhibits no mass. There is no tenderness. There is no guarding.  Musculoskeletal: Normal range of motion.  Lymphadenopathy:    She has no cervical adenopathy.  Neurological: She is alert. She has normal reflexes.  Skin: Skin  is warm and dry. She is not diaphoretic.  Nursing note and vitals reviewed.     Assessment & Plan  Problem List Items Addressed This Visit    None    Visit Diagnoses    Pneumonia of left lower lobe due to infectious organism ([place])    -  Primary   resolving/cont levaquin      No orders of the defined types were placed in this encounter.     Dr. [facility]  06/01/17

## 2017-06-02 ENCOUNTER — Other Ambulatory Visit: Payer: Self-pay

## 2017-06-02 ENCOUNTER — Emergency Department
Admission: EM | Admit: 2017-06-02 | Discharge: 2017-06-02 | Disposition: A | Discharge status: Home / Self Care | Payer: Medicare HMO | Attending: Emergency Medicine | Admitting: Emergency Medicine

## 2017-06-02 DIAGNOSIS — Z79899 Other long term (current) drug therapy: Secondary | ICD-10-CM | POA: Diagnosis not present

## 2017-06-02 DIAGNOSIS — M1000 Idiopathic gout, unspecified site: Secondary | ICD-10-CM | POA: Diagnosis not present

## 2017-06-02 DIAGNOSIS — M79675 Pain in left toe(s): Secondary | ICD-10-CM | POA: Diagnosis present

## 2017-06-02 DIAGNOSIS — I10 Essential (primary) hypertension: Secondary | ICD-10-CM | POA: Diagnosis not present

## 2017-06-02 DIAGNOSIS — Z87891 Personal history of nicotine dependence: Secondary | ICD-10-CM | POA: Diagnosis not present

## 2017-06-02 DIAGNOSIS — E119 Type 2 diabetes mellitus without complications: Secondary | ICD-10-CM | POA: Diagnosis not present

## 2017-06-02 DIAGNOSIS — Z7984 Long term (current) use of oral hypoglycemic drugs: Secondary | ICD-10-CM | POA: Insufficient documentation

## 2017-06-02 DIAGNOSIS — Z7982 Long term (current) use of aspirin: Secondary | ICD-10-CM | POA: Insufficient documentation

## 2017-06-02 DIAGNOSIS — M10072 Idiopathic gout, left ankle and foot: Secondary | ICD-10-CM | POA: Diagnosis not present

## 2017-06-02 LAB — URIC ACID: Uric Acid, Serum: 10.7 mg/dL — ABNORMAL HIGH (ref 2.3–6.6)

## 2017-06-02 MED ORDER — PREDNISONE 10 MG (21) PO TBPK: ORAL_TABLET | ORAL | 0 refills | Status: DC

## 2017-06-02 MED ORDER — OXYCODONE-ACETAMINOPHEN 5-325 MG PO TABS
1.0000 | ORAL_TABLET | Freq: Once | ORAL | Status: AC
  Administered 2017-06-02: 1 via ORAL
  Filled 2017-06-02: qty 1

## 2017-06-02 MED ORDER — COLCHICINE 0.6 MG PO TABS: 0.6000 mg | ORAL_TABLET | Freq: Every day | ORAL | 2 refills | Status: DC

## 2017-06-02 MED ORDER — OXYCODONE-ACETAMINOPHEN 5-325 MG PO TABS: 1.0000 | ORAL_TABLET | Freq: Four times a day (QID) | ORAL | 0 refills | Status: DC | PRN

## 2017-06-02 NOTE — ED Provider Notes (Signed)
[facility] Emergency Department Provider Note  ____________________________________________   First MD Initiated Contact with Patient 06/02/17 1046     (approximate)  I have reviewed the triage vital signs and the nursing notes.   HISTORY  Chief Complaint Gout    HPI April [name] is a 77 y.o. female complaining of left great toe pain.  She states she has a history of gout and this is feeling like a flare.  She states it hurts for the socks to touch her foot.  She denies any injury.  She denies any fever or chills.  Past Medical History:  Diagnosis Date  . Diabetes mellitus without complication ([place])   . Hypertension     Patient Active Problem List   Diagnosis Date Noted  . Primary osteoarthritis of knee 10/06/2016  . Type 2 diabetes mellitus without complications ([place]) 81/02/7508  . 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 Essential hypertension 06/20/2014  . Routine sports examination 06/20/2014  . Health examination of defined subpopulation 06/20/2014    Past Surgical History:  Procedure Laterality Date  . APPENDECTOMY    . gallstones  06/12/2009    Prior to Admission medications   Medication Sig Start Date End Date Taking? Authorizing Provider  ACCU-CHEK AVIVA PLUS test strip TEST BLOOD SUGAR DAILY AS DIRECTED 04/22/16   Juline Patch, MD  albuterol (PROVENTIL HFA;VENTOLIN HFA) 108 (90 Base) MCG/ACT inhaler Inhale 2 puffs into the lungs every 6 (six) hours as needed for wheezing or shortness of breath. 05/30/17   Juline Patch, MD  aspirin 81 MG tablet Take 1 tablet by mouth daily.    [provider]  colchicine 0.6 MG tablet Take 1 tablet (0.6 mg total) by mouth daily. 06/02/17 06/02/18  Mitzi Lilja, Linden Dolin, PA-C  diltiazem (DILT-XR) 240 MG 24 hr capsule Take 1 capsule (240 mg total) by mouth daily. 04/07/17   Juline Patch, MD  glipiZIDE (GLUCOTROL) 5 MG tablet Take 1 tablet  (5 mg total) by mouth 2 (two) times daily before a meal. 04/07/17   Otilio Miu C, MD  glucose blood (ACCU-CHEK AVIVA PLUS) test strip USE AS DIRECTED TO TEST BLOOD SUGAR DAILY- E11.9 04/09/16   Juline Patch, MD  lisinopril-hydrochlorothiazide (PRINZIDE,ZESTORETIC) 20-25 MG tablet Take 1 tablet by mouth daily. 04/07/17   Juline Patch, MD  metFORMIN (GLUCOPHAGE) 500 MG tablet TAKE 1 TABLET TWICE A DAY. PLEASE MAKE AN APPOINTMENT WITH YOUR DOCTOR 04/07/17   Juline Patch, MD  oxyCODONE-acetaminophen (PERCOCET/ROXICET) 5-325 MG tablet Take 1 tablet by mouth every 6 (six) hours as needed for severe pain. 06/02/17   Anelisse Jacobson, Linden Dolin, PA-C  predniSONE (STERAPRED UNI-PAK 21 TAB) 10 MG (21) TBPK tablet Take 6 pills on day one then decrease by 1 pill each day 06/02/17   Versie Starks, PA-C  simvastatin (ZOCOR) 20 MG tablet TAKE 1 TABLET EVERY DAY 05/19/17   Juline Patch, MD    Allergies Patient has no known allergies.  Family History  Problem Relation Age of Onset  . Breast cancer Neg Hx     Social History Social History   Tobacco Use  . Smoking status: Former Research scientist (life sciences)  . Smokeless tobacco: Never Used  Substance Use Topics  . Alcohol use: No    Alcohol/week: 0.0 oz  . Drug use: No    Review of Systems  Constitutional: No fever/chills Eyes: No visual changes. ENT: No sore throat. Respiratory: Denies cough Genitourinary:  Negative for dysuria. Musculoskeletal: Negative for back pain.  Positive for left foot pain Skin: Negative for rash.    ____________________________________________   PHYSICAL EXAM:  VITAL SIGNS: ED Triage Vitals  Enc Vitals Group     BP 06/02/17 1018 (!) 119/49     Pulse Rate 06/02/17 1018 82     Resp 06/02/17 1018 15     Temp 06/02/17 1018 98 F (36.7 C)     Temp Source 06/02/17 1018 Oral     SpO2 06/02/17 1018 100 %     Weight 06/02/17 1019 179 lb (81.2 kg)     Height 06/02/17 1019 5\' 6"  (1.676 m)     Head Circumference --      Peak Flow --       Pain Score 06/02/17 1018 10     Pain Loc --      Pain Edu? --      Excl. in [place]? --     Constitutional: Alert and oriented. Well appearing and in no acute distress. Eyes: Conjunctivae are normal.  Head: Atraumatic. Nose: No congestion/rhinnorhea. Mouth/Throat: Mucous membranes are moist.   Cardiovascular: Normal rate, regular rhythm. Respiratory: Normal respiratory effort.  No retractions GU: deferred Musculoskeletal: FROM all extremities, warm and well perfused.  Left foot is tender to palpation in the swollen. Neurologic:  Normal speech and language.  Skin:  Skin is warm, dry and intact. No rash noted. Psychiatric: Mood and affect are normal. Speech and behavior are normal.  ____________________________________________   LABS (all labs ordered are listed, but only abnormal results are displayed)  Labs Reviewed  URIC ACID - Abnormal; Notable for the following components:      Result Value   Uric Acid, Serum 10.7 (*)    All other components within normal limits   ____________________________________________   ____________________________________________  RADIOLOGY    ____________________________________________   PROCEDURES  Procedure(s) performed: Percocet 5/325 mg po  Procedures    ____________________________________________   INITIAL IMPRESSION / ASSESSMENT AND PLAN / ED COURSE  Pertinent labs & imaging results that were available during my care of the patient were reviewed by me and considered in my medical decision making (see chart for details).  Patient 77 year old female presents emergency department complaining of gout-like symptoms.  Physical exam left foot is tender and swollen.  She was given Percocet 5/325 mg p.o.  Uric acid was drawn.  Results are acid of 10.7  Discussed results with patient.  States she did get pain relief from Percocet.  She was given a prescription for Sterapred, focusing, and Percocet at discharge.  She is to  follow-up with her regular doctor and discuss may be taking allopurinol if she is having a hard time not eating hotdogs.  She states she understands will comply with her treatment plan.  She was discharged in stable condition.  Her son is here to drive her home.     As part of my medical decision making, I reviewed the following data within the [place] notes reviewed and incorporated, Labs reviewed uric acid was 10.7, Old chart reviewed, Notes from prior ED visits and [place] Controlled Substance Database  ____________________________________________   FINAL CLINICAL IMPRESSION(S) / ED DIAGNOSES  Final diagnoses:  Acute idiopathic gout, unspecified site      NEW MEDICATIONS STARTED DURING THIS VISIT:  Discharge Medication List as of 06/02/2017 11:36 AM    START taking these medications   Details  colchicine 0.6 MG tablet Take 1 tablet (0.6  mg total) by mouth daily., Starting Thu 06/02/2017, Until Fri 06/02/2018, Print    oxyCODONE-acetaminophen (PERCOCET/ROXICET) 5-325 MG tablet Take 1 tablet by mouth every 6 (six) hours as needed for severe pain., Starting Thu 06/02/2017, Print    predniSONE (STERAPRED UNI-PAK 21 TAB) 10 MG (21) TBPK tablet Take 6 pills on day one then decrease by 1 pill each day, Print         Note:  This document was prepared using Dragon voice recognition software and may include unintentional dictation errors.    Versie Starks, PA-C 06/02/17 1336    Earleen Newport, MD 06/02/17 [phone]

## 2017-06-02 NOTE — ED Triage Notes (Signed)
Pt c/o "gout" in left foot - she states that she has a history of gout - foot is painful and swollen

## 2017-06-02 NOTE — Discharge Instructions (Addendum)
Follow-up with your regular doctor if you are not better in 2 to 3 days.  Use medication as prescribed.  Take the pain medication as stated.  You may want to take a stool softener if you are taking the narcotic pain medicine.  If you are worsening please return the emergency department.

## 2017-06-02 NOTE — ED Notes (Signed)
See triage note  Presents with pain to left foot  Mainly great toe   Hx of gout  States pain started this am

## 2017-06-08 ENCOUNTER — Other Ambulatory Visit: Payer: Self-pay | Admitting: Family Medicine

## 2017-06-08 DIAGNOSIS — E119 Type 2 diabetes mellitus without complications: Secondary | ICD-10-CM

## 2017-07-08 ENCOUNTER — Other Ambulatory Visit: Payer: Self-pay | Admitting: Family Medicine

## 2017-07-14 ENCOUNTER — Other Ambulatory Visit: Payer: Self-pay | Admitting: Family Medicine

## 2017-07-14 DIAGNOSIS — M62830 Muscle spasm of back: Secondary | ICD-10-CM

## 2017-07-21 ENCOUNTER — Other Ambulatory Visit: Payer: Self-pay | Admitting: Family Medicine

## 2017-07-26 ENCOUNTER — Other Ambulatory Visit: Payer: Self-pay | Admitting: Family Medicine

## 2017-07-26 ENCOUNTER — Telehealth: Payer: Self-pay | Admitting: Family Medicine

## 2017-07-26 DIAGNOSIS — E7849 Other hyperlipidemia: Secondary | ICD-10-CM

## 2017-07-26 DIAGNOSIS — I10 Essential (primary) hypertension: Secondary | ICD-10-CM

## 2017-07-26 NOTE — Telephone Encounter (Signed)
Called to schedule Medicare Annual Wellness Visit with Nurse Health Advisor. If patient returns call, please note: their last AWV was on 11 /28 /16 please schedule AWV with [facility] any date  Thank you! For any questions please contact: Jill Alexanders [phone]  Or Skype me at: [email or web address]

## 2017-08-01 NOTE — Telephone Encounter (Signed)
2nd attempt

## 2017-08-10 ENCOUNTER — Other Ambulatory Visit: Payer: Self-pay | Admitting: Family Medicine

## 2017-08-10 DIAGNOSIS — E119 Type 2 diabetes mellitus without complications: Secondary | ICD-10-CM

## 2017-08-29 ENCOUNTER — Telehealth: Payer: Self-pay | Admitting: Family Medicine

## 2017-08-29 ENCOUNTER — Emergency Department
Admission: EM | Admit: 2017-08-29 | Discharge: 2017-08-29 | Disposition: A | Discharge status: Home / Self Care | Payer: Medicare HMO | Attending: Emergency Medicine | Admitting: Emergency Medicine

## 2017-08-29 ENCOUNTER — Other Ambulatory Visit: Payer: Self-pay

## 2017-08-29 ENCOUNTER — Encounter: Payer: Self-pay | Admitting: Intensive Care

## 2017-08-29 DIAGNOSIS — M79672 Pain in left foot: Secondary | ICD-10-CM | POA: Diagnosis present

## 2017-08-29 DIAGNOSIS — Z87891 Personal history of nicotine dependence: Secondary | ICD-10-CM | POA: Insufficient documentation

## 2017-08-29 DIAGNOSIS — E119 Type 2 diabetes mellitus without complications: Secondary | ICD-10-CM | POA: Diagnosis not present

## 2017-08-29 DIAGNOSIS — Z79899 Other long term (current) drug therapy: Secondary | ICD-10-CM | POA: Diagnosis not present

## 2017-08-29 DIAGNOSIS — I10 Essential (primary) hypertension: Secondary | ICD-10-CM | POA: Insufficient documentation

## 2017-08-29 DIAGNOSIS — M10072 Idiopathic gout, left ankle and foot: Secondary | ICD-10-CM | POA: Diagnosis not present

## 2017-08-29 DIAGNOSIS — Z7982 Long term (current) use of aspirin: Secondary | ICD-10-CM | POA: Insufficient documentation

## 2017-08-29 DIAGNOSIS — Z7984 Long term (current) use of oral hypoglycemic drugs: Secondary | ICD-10-CM | POA: Diagnosis not present

## 2017-08-29 HISTORY — DX: Gout, unspecified: M10.9

## 2017-08-29 LAB — BASIC METABOLIC PANEL
Anion gap: 6 (ref 5–15)
BUN: 12 mg/dL (ref 8–23)
CO2: 31 mmol/L (ref 22–32)
Calcium: 9.3 mg/dL (ref 8.9–10.3)
Chloride: 105 mmol/L (ref 98–111)
Creatinine, Ser: 0.89 mg/dL (ref 0.44–1.00)
GFR calc Af Amer: >60 mL/min (ref >60)
GFR calc non Af Amer: >60 mL/min (ref >60)
Glucose, Bld: 133 mg/dL — ABNORMAL HIGH (ref 70–99)
Potassium: 4 mmol/L (ref 3.5–5.1)
Sodium: 142 mmol/L (ref 135–145)

## 2017-08-29 LAB — CBC WITH DIFFERENTIAL/PLATELET
Basophils Absolute: 0.1 10*3/uL (ref 0–0.1)
Basophils Relative: 1 %
Eosinophils Absolute: 0.2 10*3/uL (ref 0–0.7)
Eosinophils Relative: 3 %
HCT: 32.6 % — ABNORMAL LOW (ref 35.0–47.0)
Hemoglobin: 10.7 g/dL — ABNORMAL LOW (ref 12.0–16.0)
Lymphocytes Relative: 34 %
Lymphs Abs: 2.5 10*3/uL (ref 1.0–3.6)
MCH: 29.2 pg (ref 26.0–34.0)
MCHC: 32.9 g/dL (ref 32.0–36.0)
MCV: 88.6 fL (ref 80.0–100.0)
Monocytes Absolute: 0.4 10*3/uL (ref 0.2–0.9)
Monocytes Relative: 6 %
Neutro Abs: 4.2 10*3/uL (ref 1.4–6.5)
Neutrophils Relative %: 56 %
Platelets: 284 10*3/uL (ref 150–440)
RBC: 3.68 MIL/uL — ABNORMAL LOW (ref 3.80–5.20)
RDW: 14.1 % (ref 11.5–14.5)
WBC: 7.4 10*3/uL (ref 3.6–11.0)

## 2017-08-29 LAB — URIC ACID: Uric Acid, Serum: 7.7 mg/dL — ABNORMAL HIGH (ref 2.5–7.1)

## 2017-08-29 MED ORDER — TRAMADOL HCL 50 MG PO TABS
50.0000 mg | ORAL_TABLET | Freq: Once | ORAL | Status: AC
  Administered 2017-08-29: 50 mg via ORAL
  Filled 2017-08-29: qty 1

## 2017-08-29 MED ORDER — COLCHICINE 0.6 MG PO TABS: 0.6000 mg | ORAL_TABLET | Freq: Every day | ORAL | 2 refills | Status: DC

## 2017-08-29 MED ORDER — DEXAMETHASONE SODIUM PHOSPHATE 10 MG/ML IJ SOLN
10.0000 mg | Freq: Once | INTRAMUSCULAR | Status: DC
  Filled 2017-08-29: qty 1

## 2017-08-29 MED ORDER — DEXAMETHASONE SODIUM PHOSPHATE 10 MG/ML IJ SOLN
10.0000 mg | Freq: Once | INTRAMUSCULAR | Status: AC
  Administered 2017-08-29: 10 mg via INTRAVENOUS

## 2017-08-29 NOTE — ED Triage Notes (Signed)
Patient reports her L knee down to her L foot is experiencing pain. HX gout and pt believes this is a gout problem. Denies injury/trauma

## 2017-08-29 NOTE — Discharge Instructions (Addendum)
Prescription was sent to [facility] in [facility].

## 2017-08-29 NOTE — Telephone Encounter (Signed)
Insurance nurse coming to her house in September wants to call me back once she has her cal to see the exact date. knb

## 2017-08-29 NOTE — ED Notes (Signed)
Pt states her "gout is flaring up" no gross swelling or eurythermia noted.

## 2017-08-29 NOTE — ED Provider Notes (Signed)
[facility] Emergency Department Provider Note   ____________________________________________   First MD Initiated Contact with Patient 08/29/17 1219     (approximate)  I have reviewed the triage vital signs and the nursing notes.   HISTORY  Chief Complaint Knee Pain (Left) and Foot Pain (Left)    HPI April [name] is a 77 y.o. female patient complain of left lower extremity pain for 1 week.  Patient has a history of gout.  Patient relates a flare of her condition.  Patient last dose of colchicine was approximately 2 months ago.  Patient has not follow-up PCP from her last ER visit for this complaint.  Patient rates the pain as a 10/10.  Patient described the pain is "aching".  No palliative measures for  complaint.   Past Medical History:  Diagnosis Date  . Diabetes mellitus without complication ([place])   . Gout   . Hypertension     Patient Active Problem List   Diagnosis Date Noted  . Primary osteoarthritis of knee 10/06/2016  . Type 2 diabetes mellitus without complications ([place]) 64/68/0321  . 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 Essential hypertension 06/20/2014  . Routine sports examination 06/20/2014  . Health examination of defined subpopulation 06/20/2014    Past Surgical History:  Procedure Laterality Date  . APPENDECTOMY    . gallstones  06/12/2009    Prior to Admission medications   Medication Sig Start Date End Date Taking? Authorizing Provider  ACCU-CHEK AVIVA PLUS test strip TEST BLOOD SUGAR DAILY AS DIRECTED 07/21/17   Juline Patch, MD  albuterol (PROVENTIL HFA;VENTOLIN HFA) 108 (90 Base) MCG/ACT inhaler Inhale 2 puffs into the lungs every 6 (six) hours as needed for wheezing or shortness of breath. 05/30/17   Juline Patch, MD  aspirin 81 MG tablet Take 1 tablet by mouth daily.    [provider]  colchicine 0.6 MG tablet Take 1 tablet (0.6 mg total) by mouth  daily. 06/02/17 06/02/18  Fisher, Linden Dolin, PA-C  colchicine 0.6 MG tablet Take 1 tablet (0.6 mg total) by mouth daily. 08/29/17 08/29/18  Sable Feil, PA-C  cyclobenzaprine (FLEXERIL) 5 MG tablet TAKE 1 TABLET BY MOUTH AT BEDTIME. DO NOT TAKE WITH PAIN MEDICINE 07/15/17   Juline Patch, MD  DILT-XR 240 MG 24 hr capsule TAKE 1 CAPSULE EVERY DAY 07/28/17   Juline Patch, MD  glipiZIDE (GLUCOTROL) 5 MG tablet TAKE 1 TABLET TWICE DAILY 06/09/17   Otilio Miu C, MD  glucose blood (ACCU-CHEK AVIVA PLUS) test strip USE AS DIRECTED TO TEST BLOOD SUGAR DAILY- E11.9 04/09/16   Juline Patch, MD  lisinopril-hydrochlorothiazide (PRINZIDE,ZESTORETIC) 20-12.5 MG tablet TAKE 1 TABLET EVERY DAY 07/28/17   Juline Patch, MD  lisinopril-hydrochlorothiazide (PRINZIDE,ZESTORETIC) 20-25 MG tablet Take 1 tablet by mouth daily. 04/07/17   Juline Patch, MD  metFORMIN (GLUCOPHAGE) 500 MG tablet TAKE 1 TABLET TWICE DAILY 06/09/17   Juline Patch, MD  oxyCODONE-acetaminophen (PERCOCET/ROXICET) 5-325 MG tablet Take 1 tablet by mouth every 6 (six) hours as needed for severe pain. 06/02/17   Fisher, Linden Dolin, PA-C  predniSONE (STERAPRED UNI-PAK 21 TAB) 10 MG (21) TBPK tablet Take 6 pills on day one then decrease by 1 pill each day 06/02/17   Versie Starks, PA-C  simvastatin (ZOCOR) 20 MG tablet TAKE 1 TABLET EVERY DAY 07/28/17   Juline Patch, MD    Allergies Patient has no known allergies.  Family History  Problem Relation Age of Onset  . Breast cancer Neg Hx     Social History Social History   Tobacco Use  . Smoking status: Former Research scientist (life sciences)  . Smokeless tobacco: Never Used  Substance Use Topics  . Alcohol use: No    Alcohol/week: 0.0 oz  . Drug use: No    Review of Systems  Constitutional: No fever/chills Eyes: No visual changes. ENT: No sore throat. Cardiovascular: Denies chest pain. Respiratory: Denies shortness of breath. Gastrointestinal: No abdominal pain.  No nausea, no vomiting.  No diarrhea.   No constipation. Genitourinary: Negative for dysuria. Musculoskeletal: Negative for back pain. Skin: Negative for rash. Neurological: Negative for headaches, focal weakness or numbness. Endocrine:Diabetes, gout, hypertension.  ____________________________________________   PHYSICAL EXAM:  VITAL SIGNS: ED Triage Vitals  Enc Vitals Group     BP 08/29/17 1204 (!) 131/56     Pulse Rate 08/29/17 1204 70     Resp 08/29/17 1204 18     Temp 08/29/17 1204 98.6 F (37 C)     Temp Source 08/29/17 1204 Oral     SpO2 08/29/17 1204 98 %     Weight 08/29/17 1204 180 lb (81.6 kg)     Height 08/29/17 1204 5\' 6"  (1.676 m)     Head Circumference --      Peak Flow --      Pain Score 08/29/17 1217 10     Pain Loc --      Pain Edu? --      Excl. in [place]? --    Constitutional: Alert and oriented. Well appearing and in no acute distress. Neck: No stridor. Hematological/Lymphatic/Immunilogical: No cervical lymphadenopathy. Cardiovascular: Normal rate, regular rhythm. Grossly normal heart sounds.  Good peripheral circulation. Respiratory: Normal respiratory effort.  No retractions. Lungs CTAB. Musculoskeletal: Left lower extremity guarding with palpation of the anterior knee and lateral malleolus.  No joint effusions. Neurologic:  Normal speech and language. No gross focal neurologic deficits are appreciated. No gait instability. Skin:  Skin is warm, dry and intact. No rash noted.  No edema or erythema. Psychiatric: Mood and affect are normal. Speech and behavior are normal.  ____________________________________________   LABS (all labs ordered are listed, but only abnormal results are displayed)  Labs Reviewed  BASIC METABOLIC PANEL - Abnormal; Notable for the following components:      Result Value   Glucose, Bld 133 (*)    All other components within normal limits  CBC WITH DIFFERENTIAL/PLATELET - Abnormal; Notable for the following components:   RBC 3.68 (*)    Hemoglobin 10.7 (*)     HCT 32.6 (*)    All other components within normal limits  URIC ACID - Abnormal; Notable for the following components:   Uric Acid, Serum 7.7 (*)    All other components within normal limits   ____________________________________________  EKG   ____________________________________________  RADIOLOGY  ED MD interpretation:    Official radiology report(s): No results found.  ____________________________________________   PROCEDURES  Procedure(s) performed: None  Procedures  Critical Care performed: No  ____________________________________________   INITIAL IMPRESSION / ASSESSMENT AND PLAN / ED COURSE  As part of my medical decision making, I reviewed the following data within the electronic MEDICAL RECORD NUMBER    Left lower extremity pain secondary to gout.  Discussed lab results with patient.  Patient given discharge care instruction.  Patient given prescription for colchicine.  Patient advised to follow-up family doctor for continued care.  ____________________________________________   FINAL CLINICAL IMPRESSION(S) / ED DIAGNOSES  Final diagnoses:  Acute idiopathic gout of left foot     ED Discharge Orders        Ordered    colchicine 0.6 MG tablet  Daily     08/29/17 1259       Note:  This document was prepared using Dragon voice recognition software and may include unintentional dictation errors.    Sable Feil, PA-C 08/29/17 1325    Nena Polio, MD 08/29/17 [phone]

## 2017-09-07 ENCOUNTER — Ambulatory Visit (INDEPENDENT_AMBULATORY_CARE_PROVIDER_SITE_OTHER): Payer: Medicare HMO | Admitting: Family Medicine

## 2017-09-07 ENCOUNTER — Encounter: Payer: Self-pay | Admitting: Family Medicine

## 2017-09-07 VITALS — BP 130/64 | HR 72 | Ht 66.0 in | Wt 172.0 lb

## 2017-09-07 DIAGNOSIS — Z780 Asymptomatic menopausal state: Secondary | ICD-10-CM | POA: Diagnosis not present

## 2017-09-07 DIAGNOSIS — M1710 Unilateral primary osteoarthritis, unspecified knee: Secondary | ICD-10-CM | POA: Diagnosis not present

## 2017-09-07 MED ORDER — MELOXICAM 7.5 MG PO TABS: 7.5000 mg | ORAL_TABLET | Freq: Every day | ORAL | 0 refills | Status: DC

## 2017-09-07 NOTE — Patient Instructions (Signed)
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
## 2017-09-22 ENCOUNTER — Inpatient Hospital Stay: Admission: RE | Admit: 2017-09-22 | Payer: Medicare HMO | Source: Ambulatory Visit

## 2017-09-27 ENCOUNTER — Ambulatory Visit
Admission: RE | Admit: 2017-09-27 | Discharge: 2017-09-27 | Disposition: A | Discharge status: Home / Self Care | Payer: Medicare HMO | Source: Ambulatory Visit | Attending: Family Medicine | Admitting: Family Medicine

## 2017-09-27 DIAGNOSIS — Z780 Asymptomatic menopausal state: Secondary | ICD-10-CM

## 2017-09-27 DIAGNOSIS — M1710 Unilateral primary osteoarthritis, unspecified knee: Secondary | ICD-10-CM | POA: Diagnosis not present

## 2017-09-27 DIAGNOSIS — M85851 Other specified disorders of bone density and structure, right thigh: Secondary | ICD-10-CM | POA: Diagnosis not present

## 2017-10-04 ENCOUNTER — Other Ambulatory Visit: Payer: Self-pay | Admitting: Family Medicine

## 2017-10-04 DIAGNOSIS — I10 Essential (primary) hypertension: Secondary | ICD-10-CM

## 2017-10-04 DIAGNOSIS — E7849 Other hyperlipidemia: Secondary | ICD-10-CM

## 2017-10-24 DIAGNOSIS — E1159 Type 2 diabetes mellitus with other circulatory complications: Secondary | ICD-10-CM | POA: Diagnosis not present

## 2017-10-24 DIAGNOSIS — E119 Type 2 diabetes mellitus without complications: Secondary | ICD-10-CM | POA: Diagnosis not present

## 2017-10-24 DIAGNOSIS — E1169 Type 2 diabetes mellitus with other specified complication: Secondary | ICD-10-CM | POA: Diagnosis not present

## 2017-10-24 DIAGNOSIS — I10 Essential (primary) hypertension: Secondary | ICD-10-CM | POA: Diagnosis not present

## 2017-10-24 DIAGNOSIS — E785 Hyperlipidemia, unspecified: Secondary | ICD-10-CM | POA: Diagnosis not present

## 2017-11-01 ENCOUNTER — Other Ambulatory Visit: Payer: Self-pay | Admitting: Family Medicine

## 2017-11-01 DIAGNOSIS — M1710 Unilateral primary osteoarthritis, unspecified knee: Secondary | ICD-10-CM

## 2017-11-11 ENCOUNTER — Other Ambulatory Visit: Payer: Self-pay | Admitting: Family Medicine

## 2017-11-11 ENCOUNTER — Encounter: Payer: Self-pay | Admitting: Family Medicine

## 2017-11-11 ENCOUNTER — Ambulatory Visit (INDEPENDENT_AMBULATORY_CARE_PROVIDER_SITE_OTHER): Payer: Medicare HMO | Admitting: Family Medicine

## 2017-11-11 VITALS — BP 130/76 | HR 64 | Ht 66.0 in | Wt 173.0 lb

## 2017-11-11 DIAGNOSIS — Z1231 Encounter for screening mammogram for malignant neoplasm of breast: Secondary | ICD-10-CM | POA: Diagnosis not present

## 2017-11-11 NOTE — Progress Notes (Signed)
Date:  11/11/2017   Name:  April [name]   DOB:  11-06-1940   MRN:  [id number]   Chief Complaint: breast exam Patient presents for breast exam prior to mammogram on 12/20/17.     Review of Systems  Constitutional: Negative.  Negative for chills, fatigue, fever and unexpected weight change.  HENT: Negative for congestion, ear discharge, ear pain, rhinorrhea, sinus pressure, sneezing and sore throat.   Eyes: Negative for photophobia, pain, discharge, redness and itching.  Respiratory: Negative for cough, shortness of breath, wheezing and stridor.   Gastrointestinal: Negative for abdominal pain, blood in stool, constipation, diarrhea, nausea and vomiting.  Endocrine: Negative for cold intolerance, heat intolerance, polydipsia, polyphagia and polyuria.  Genitourinary: Negative for dysuria, flank pain, frequency, hematuria, menstrual problem, pelvic pain, urgency, vaginal bleeding and vaginal discharge.  Musculoskeletal: Negative for arthralgias, back pain and myalgias.  Skin: Negative for rash.  Allergic/Immunologic: Negative for environmental allergies and food allergies.  Neurological: Negative for dizziness, weakness, light-headedness, numbness and headaches.  Hematological: Negative for adenopathy. Does not bruise/bleed easily.  Psychiatric/Behavioral: Negative for dysphoric mood. The patient is not nervous/anxious.     Patient Active Problem List   Diagnosis Date Noted  . Primary osteoarthritis of knee 10/06/2016  . Type 2 diabetes mellitus without complications ([place]) 97/67/3419  . 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 Essential hypertension 06/20/2014  . Routine sports examination 06/20/2014  . Health examination of defined subpopulation 06/20/2014    No Known Allergies  Past Surgical History:  Procedure Laterality Date  . APPENDECTOMY    . gallstones  06/12/2009    Social History   Tobacco Use  . Smoking  status: Former Research scientist (life sciences)  . Smokeless tobacco: Never Used  Substance Use Topics  . Alcohol use: No    Alcohol/week: 0.0 standard drinks  . Drug use: No     Medication list has been reviewed and updated.  No outpatient medications have been marked as taking for the 11/11/17 encounter (Office Visit) with Juline Patch, MD.   Current Facility-Administered Medications for the 11/11/17 encounter (Office Visit) with Juline Patch, MD  Medication  . albuterol (PROVENTIL) (2.5 MG/3ML) 0.083% nebulizer solution 2.5 mg    PHQ 2/9 Scores 11/11/2017 10/06/2016 05/19/2015  PHQ - 2 Score 0 0 0    Physical Exam  Constitutional: No distress.  HENT:  Head: Normocephalic and atraumatic.  Right Ear: External ear normal.  Left Ear: External ear normal.  Nose: Nose normal.  Mouth/Throat: Oropharynx is clear and moist.  Eyes: Pupils are equal, round, and reactive to light. Conjunctivae and EOM are normal. Right eye exhibits no discharge. Left eye exhibits no discharge.  Neck: Normal range of motion. Neck supple. No JVD present. No thyromegaly present.  Cardiovascular: Normal rate, regular rhythm, normal heart sounds and intact distal pulses. Exam reveals no gallop and no friction rub.  No murmur heard. Pulmonary/Chest: Effort normal and breath sounds normal. She has no wheezes. She has no rhonchi. She has no rales. She exhibits no mass and no tenderness. Right breast exhibits no inverted nipple, no mass, no nipple discharge, no skin change and no tenderness. Left breast exhibits no inverted nipple, no mass, no nipple discharge, no skin change and no tenderness. No breast swelling, tenderness, discharge or bleeding. Breasts are symmetrical.  Abdominal: Soft. Bowel sounds are normal. She exhibits no mass. There is no tenderness. There is no guarding.  Musculoskeletal: Normal range of motion. She exhibits  no edema.  Lymphadenopathy:    She has no cervical adenopathy.  Neurological: She is alert. She has  normal reflexes.  Skin: Skin is warm and dry. She is not diaphoretic.  Nursing note and vitals reviewed.   BP 130/76   Pulse 64   Ht 5\' 6"  (1.676 m)   Wt 173 lb (78.5 kg)   BMI 27.92 kg/m   Assessment and Plan:  1. Breast cancer screening by mammogram Manual breast exam prior to mammogram. No concerns note in history nor exam. - MM 3D SCREEN BREAST BILATERAL; Future   Dr. Otilio Miu [facility]  11/11/2017

## 2017-12-09 ENCOUNTER — Other Ambulatory Visit: Payer: Self-pay | Admitting: Family Medicine

## 2017-12-09 DIAGNOSIS — I10 Essential (primary) hypertension: Secondary | ICD-10-CM

## 2017-12-09 DIAGNOSIS — E7849 Other hyperlipidemia: Secondary | ICD-10-CM

## 2017-12-20 ENCOUNTER — Ambulatory Visit
Admission: RE | Admit: 2017-12-20 | Discharge: 2017-12-20 | Disposition: A | Discharge status: Home / Self Care | Payer: Medicare HMO | Source: Ambulatory Visit | Attending: Family Medicine | Admitting: Family Medicine

## 2017-12-20 DIAGNOSIS — Z1231 Encounter for screening mammogram for malignant neoplasm of breast: Secondary | ICD-10-CM | POA: Insufficient documentation

## 2017-12-29 ENCOUNTER — Other Ambulatory Visit: Payer: Self-pay | Admitting: Family Medicine

## 2018-02-11 ENCOUNTER — Emergency Department
Admission: EM | Admit: 2018-02-11 | Discharge: 2018-02-11 | Disposition: A | Discharge status: Home / Self Care | Payer: Medicare HMO | Attending: Emergency Medicine | Admitting: Emergency Medicine

## 2018-02-11 ENCOUNTER — Emergency Department: Payer: Medicare HMO

## 2018-02-11 ENCOUNTER — Other Ambulatory Visit: Payer: Self-pay

## 2018-02-11 ENCOUNTER — Encounter: Payer: Self-pay | Admitting: Emergency Medicine

## 2018-02-11 DIAGNOSIS — Z87891 Personal history of nicotine dependence: Secondary | ICD-10-CM | POA: Diagnosis not present

## 2018-02-11 DIAGNOSIS — E119 Type 2 diabetes mellitus without complications: Secondary | ICD-10-CM | POA: Insufficient documentation

## 2018-02-11 DIAGNOSIS — Z7984 Long term (current) use of oral hypoglycemic drugs: Secondary | ICD-10-CM | POA: Diagnosis not present

## 2018-02-11 DIAGNOSIS — J209 Acute bronchitis, unspecified: Secondary | ICD-10-CM | POA: Insufficient documentation

## 2018-02-11 DIAGNOSIS — Z79899 Other long term (current) drug therapy: Secondary | ICD-10-CM | POA: Diagnosis not present

## 2018-02-11 DIAGNOSIS — R05 Cough: Secondary | ICD-10-CM | POA: Diagnosis not present

## 2018-02-11 DIAGNOSIS — I10 Essential (primary) hypertension: Secondary | ICD-10-CM | POA: Diagnosis not present

## 2018-02-11 LAB — INFLUENZA PANEL BY PCR (TYPE A & B)
Influenza A By PCR: NEGATIVE
Influenza B By PCR: NEGATIVE

## 2018-02-11 MED ORDER — AZITHROMYCIN 250 MG PO TABS: ORAL_TABLET | ORAL | 0 refills | Status: AC

## 2018-02-11 MED ORDER — BENZONATATE 100 MG PO CAPS: 100.0000 mg | ORAL_CAPSULE | Freq: Three times a day (TID) | ORAL | 0 refills | Status: DC | PRN

## 2018-02-11 NOTE — Discharge Instructions (Signed)
Take medication as directed and follow-up with PCP in 1 week if no improvement.

## 2018-02-11 NOTE — ED Notes (Signed)
Pt c/o weakness and cough x 2 days. Flu swab obtained

## 2018-02-11 NOTE — ED Triage Notes (Signed)
Cough x 2 days

## 2018-02-11 NOTE — ED Provider Notes (Signed)
[facility] Emergency Department Provider Note   ____________________________________________   First MD Initiated Contact with Patient 02/11/18 1250     (approximate)  I have reviewed the triage vital signs and the nursing notes.   HISTORY  Chief Complaint Cough    HPI April [name] is a 78 y.o. female patient presents with cough for 2 days.  Patient states chest wall pain secondary to cough.  Patient denies fever/chills.  Patient denies nausea, vomiting, diarrhea.  Patient state she has been around others with similar complaints.  No palliative measure for complaint. Past Medical History:  Diagnosis Date  . Diabetes mellitus without complication ([place])   . Gout   . Hypertension     Patient Active Problem List   Diagnosis Date Noted  . Primary osteoarthritis of knee 10/06/2016  . Type 2 diabetes mellitus without complications ([place]) 27/04/5007  . 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 Essential hypertension 06/20/2014  . Routine sports examination 06/20/2014  . Health examination of defined subpopulation 06/20/2014    Past Surgical History:  Procedure Laterality Date  . APPENDECTOMY    . gallstones  06/12/2009    Prior to Admission medications   Medication Sig Start Date End Date Taking? Authorizing Provider  ACCU-CHEK AVIVA PLUS test strip TEST BLOOD SUGAR DAILY AS DIRECTED 07/21/17   Juline Patch, MD  albuterol (PROVENTIL HFA;VENTOLIN HFA) 108 (90 Base) MCG/ACT inhaler Inhale 2 puffs into the lungs every 6 (six) hours as needed for wheezing or shortness of breath. 05/30/17   Juline Patch, MD  aspirin 81 MG tablet Take 1 tablet by mouth daily.    [provider]  azithromycin (ZITHROMAX Z-PAK) 250 MG tablet Take 2 tablets (500 mg) on  Day 1,  followed by 1 tablet (250 mg) once daily on Days 2 through 5. 02/11/18 02/16/18  Sable Feil, PA-C  benzonatate (TESSALON PERLES)  100 MG capsule Take 1 capsule (100 mg total) by mouth 3 (three) times daily as needed for cough. 02/11/18   Sable Feil, PA-C  colchicine 0.6 MG tablet Take 1 tablet (0.6 mg total) by mouth daily. 06/02/17 06/02/18  Versie Starks, PA-C  DILT-XR 240 MG 24 hr capsule TAKE 1 CAPSULE EVERY DAY 12/09/17   Juline Patch, MD  glipiZIDE (GLUCOTROL) 5 MG tablet TAKE 1 TABLET TWICE DAILY 06/09/17   Otilio Miu C, MD  glucose blood (ACCU-CHEK AVIVA PLUS) test strip USE AS DIRECTED TO TEST BLOOD SUGAR DAILY- E11.9 04/09/16   Juline Patch, MD  lisinopril-hydrochlorothiazide (PRINZIDE,ZESTORETIC) 20-12.5 MG tablet TAKE 1 TABLET EVERY DAY 12/09/17   Juline Patch, MD  meloxicam (MOBIC) 7.5 MG tablet TAKE 1 TABLET(7.5 MG) BY MOUTH DAILY 11/02/17   Juline Patch, MD  metFORMIN (GLUCOPHAGE) 500 MG tablet TAKE 1 TABLET TWICE DAILY 06/09/17   Juline Patch, MD  simvastatin (ZOCOR) 20 MG tablet TAKE 1 TABLET EVERY DAY 12/09/17   Juline Patch, MD    Allergies Patient has no known allergies.  Family History  Problem Relation Age of Onset  . Breast cancer Neg Hx     Social History Social History   Tobacco Use  . Smoking status: Former Research scientist (life sciences)  . Smokeless tobacco: Never Used  Substance Use Topics  . Alcohol use: No    Alcohol/week: 0.0 standard drinks  . Drug use: No    Review of Systems Constitutional: No fever/chills Eyes: No visual changes. ENT: No  sore throat.  Nasal congestion Cardiovascular: Denies chest pain. Respiratory: Denies shortness of breath.  Productive cough Gastrointestinal: No abdominal pain.  No nausea, no vomiting.  No diarrhea.  No constipation. Genitourinary: Negative for dysuria. Musculoskeletal: Negative for back pain. Skin: Negative for rash. Neurological: Negative for headaches, focal weakness or numbness.   ____________________________________________   PHYSICAL EXAM:  VITAL SIGNS: ED Triage Vitals  Enc Vitals Group     BP 02/11/18 1236 (!) 149/53      Pulse Rate 02/11/18 1236 79     Resp 02/11/18 1236 18     Temp 02/11/18 1236 98.8 F (37.1 C)     Temp Source 02/11/18 1236 Oral     SpO2 02/11/18 1236 97 %     Weight 02/11/18 1238 179 lb (81.2 kg)     Height 02/11/18 1238 5\' 6"  (1.676 m)     Head Circumference --      Peak Flow --      Pain Score 02/11/18 1238 0     Pain Loc --      Pain Edu? --      Excl. in [place]? --     Constitutional: Alert and oriented. Well appearing and in no acute distress. Nose: Edematous nasal turbinates Mouth/Throat: Mucous membranes are moist.  Oropharynx non-erythematous. Neck: No stridor.   Cardiovascular: Normal rate, regular rhythm. Grossly normal heart sounds.  Good peripheral circulation. Respiratory: Normal respiratory effort.  No retractions. Lungs expiratory rales. Skin:  Skin is warm, dry and intact. No rash noted. Psychiatric: Mood and affect are normal. Speech and behavior are normal.  ____________________________________________   LABS (all labs ordered are listed, but only abnormal results are displayed)  Labs Reviewed  INFLUENZA PANEL BY PCR (TYPE A & B)   ____________________________________________  EKG   ____________________________________________  RADIOLOGY  ED MD interpretation:    Official radiology report(s): Dg Chest 2 View  Result Date: 02/11/2018 CLINICAL DATA:  Productive cough and weakness over the last few days. EXAM: CHEST - 2 VIEW COMPARISON:  05/30/2017 FINDINGS: Heart size is normal. There is aortic atherosclerosis. There may be mild central bronchial thickening but there is no infiltrate, collapse or effusion. Ordinary degenerative changes affect the spine. IMPRESSION: 1. Possible bronchitis. No consolidation or collapse. 2. Aortic atherosclerosis. Electronically Signed   By: Nelson Chimes M.D.   On: 02/11/2018 13:35    ____________________________________________   PROCEDURES  Procedure(s) performed: None  Procedures  Critical Care performed:  No  ____________________________________________   INITIAL IMPRESSION / ASSESSMENT AND PLAN / ED COURSE  As part of my medical decision making, I reviewed the following data within the [place]    Patient presents with 2 days of cough and body aches.  Patient was negative for influenza.  Chest x-ray consistent with bronchitis.  Patient given discharge care instruction advised take medication as directed.  Patient advised follow-up PCP in 3 to 5 days if no improvement.     ____________________________________________   FINAL CLINICAL IMPRESSION(S) / ED DIAGNOSES  Final diagnoses:  Acute bronchitis, unspecified organism     ED Discharge Orders         Ordered    azithromycin (ZITHROMAX Z-PAK) 250 MG tablet     02/11/18 1403    benzonatate (TESSALON PERLES) 100 MG capsule  3 times daily PRN     02/11/18 1403           Note:  This document was prepared using Dragon voice recognition software and may  include unintentional dictation errors.    Sable Feil, PA-C 02/11/18 1406    Arta Silence, MD 02/11/18 [phone]

## 2018-02-20 ENCOUNTER — Ambulatory Visit: Payer: Medicare HMO | Admitting: Family Medicine

## 2018-02-21 ENCOUNTER — Encounter: Payer: Self-pay | Admitting: Family Medicine

## 2018-02-21 ENCOUNTER — Ambulatory Visit (INDEPENDENT_AMBULATORY_CARE_PROVIDER_SITE_OTHER): Payer: Medicare HMO | Admitting: Family Medicine

## 2018-02-21 VITALS — BP 130/70 | HR 72 | Temp 98.0°F | Ht 66.0 in | Wt 176.0 lb

## 2018-02-21 DIAGNOSIS — E119 Type 2 diabetes mellitus without complications: Secondary | ICD-10-CM | POA: Diagnosis not present

## 2018-02-21 DIAGNOSIS — R059 Cough, unspecified: Secondary | ICD-10-CM

## 2018-02-21 DIAGNOSIS — J0100 Acute maxillary sinusitis, unspecified: Secondary | ICD-10-CM | POA: Diagnosis not present

## 2018-02-21 DIAGNOSIS — R05 Cough: Secondary | ICD-10-CM

## 2018-02-21 MED ORDER — DOXYCYCLINE HYCLATE 100 MG PO TABS: 100.0000 mg | ORAL_TABLET | Freq: Two times a day (BID) | ORAL | 0 refills | Status: DC

## 2018-02-21 MED ORDER — GUAIFENESIN-CODEINE 100-10 MG/5ML PO SYRP: 5.0000 mL | ORAL_SOLUTION | Freq: Three times a day (TID) | ORAL | 0 refills | Status: DC | PRN

## 2018-02-21 NOTE — Progress Notes (Signed)
Date:  02/21/2018   Name:  April [name]   DOB:  04-22-1940   MRN:  [id number]   Chief Complaint: Follow-up (seen in urgent care on 02/11/18- Dx bronchitis and given ZPack and tessalon perles. Doesn't feel any better- still having green production and cong. )  Cough  This is a new problem. The current episode started 1 to 4 weeks ago. The problem has been unchanged. The cough is productive of purulent sputum (prod green/yellow). Associated symptoms include nasal congestion and rhinorrhea. Pertinent negatives include no chest pain, chills, ear congestion, ear pain, eye redness, fever, headaches, heartburn, hemoptysis, myalgias, postnasal drip, rash, sore throat, shortness of breath, sweats, weight loss or wheezing. The symptoms are aggravated by cold air. She has tried a beta-agonist inhaler and prescription cough suppressant (azithromycin/tessalon) for the symptoms. The treatment provided no relief. Her past medical history is significant for bronchitis. There is no history of asthma, bronchiectasis, COPD, emphysema or environmental allergies.    Review of Systems  Constitutional: Negative.  Negative for chills, fatigue, fever, unexpected weight change and weight loss.  HENT: Positive for rhinorrhea. Negative for congestion, ear discharge, ear pain, postnasal drip, sinus pressure, sneezing and sore throat.   Eyes: Negative for photophobia, pain, discharge, redness and itching.  Respiratory: Positive for cough. Negative for hemoptysis, shortness of breath, wheezing and stridor.   Cardiovascular: Negative for chest pain.  Gastrointestinal: Negative for abdominal pain, blood in stool, constipation, diarrhea, heartburn, nausea and vomiting.  Endocrine: Negative for cold intolerance, heat intolerance, polydipsia, polyphagia and polyuria.  Genitourinary: Negative for dysuria, flank pain, frequency, hematuria, menstrual problem, pelvic pain, urgency, vaginal bleeding and vaginal discharge.    Musculoskeletal: Negative for arthralgias, back pain and myalgias.  Skin: Negative for rash.  Allergic/Immunologic: Negative for environmental allergies and food allergies.  Neurological: Negative for dizziness, weakness, light-headedness, numbness and headaches.  Hematological: Negative for adenopathy. Does not bruise/bleed easily.  Psychiatric/Behavioral: Negative for dysphoric mood. The patient is not nervous/anxious.     Patient Active Problem List   Diagnosis Date Noted  . Primary osteoarthritis of knee 10/06/2016  . Type 2 diabetes mellitus without complications ([place]) 63/02/6008  . 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 Essential hypertension 06/20/2014  . Routine sports examination 06/20/2014  . Health examination of defined subpopulation 06/20/2014    No Known Allergies  Past Surgical History:  Procedure Laterality Date  . APPENDECTOMY    . gallstones  06/12/2009    Social History   Tobacco Use  . Smoking status: Former Research scientist (life sciences)  . Smokeless tobacco: Never Used  Substance Use Topics  . Alcohol use: No    Alcohol/week: 0.0 standard drinks  . Drug use: No     Medication list has been reviewed and updated.  Current Meds  Medication Sig  . ACCU-CHEK AVIVA PLUS test strip TEST BLOOD SUGAR DAILY AS DIRECTED  . albuterol (PROVENTIL HFA;VENTOLIN HFA) 108 (90 Base) MCG/ACT inhaler Inhale 2 puffs into the lungs every 6 (six) hours as needed for wheezing or shortness of breath.  Marland Kitchen aspirin 81 MG tablet Take 1 tablet by mouth daily.  . colchicine 0.6 MG tablet Take 1 tablet (0.6 mg total) by mouth daily.  Marland Kitchen DILT-XR 240 MG 24 hr capsule TAKE 1 CAPSULE EVERY DAY  . glipiZIDE (GLUCOTROL) 5 MG tablet TAKE 1 TABLET TWICE DAILY  . glucose blood (ACCU-CHEK AVIVA PLUS) test strip USE AS DIRECTED TO TEST BLOOD SUGAR DAILY- E11.9  . lisinopril-hydrochlorothiazide (  PRINZIDE,ZESTORETIC) 20-12.5 MG tablet TAKE 1 TABLET EVERY DAY   . meloxicam (MOBIC) 7.5 MG tablet TAKE 1 TABLET(7.5 MG) BY MOUTH DAILY  . metFORMIN (GLUCOPHAGE) 500 MG tablet TAKE 1 TABLET TWICE DAILY  . simvastatin (ZOCOR) 20 MG tablet TAKE 1 TABLET EVERY DAY   Current Facility-Administered Medications for the 02/21/18 encounter (Office Visit) with Juline Patch, MD  Medication  . albuterol (PROVENTIL) (2.5 MG/3ML) 0.083% nebulizer solution 2.5 mg    PHQ 2/9 Scores 11/11/2017 10/06/2016 05/19/2015  PHQ - 2 Score 0 0 0    Physical Exam Constitutional:      General: She is not in acute distress.    Appearance: She is not diaphoretic.  HENT:     Head: Normocephalic and atraumatic.     Right Ear: Tympanic membrane, ear canal and external ear normal.     Left Ear: Tympanic membrane, ear canal and external ear normal.     Nose: No nasal deformity, septal deviation, nasal tenderness, mucosal edema, congestion or rhinorrhea.     Right Turbinates: Swollen.     Left Turbinates: Swollen.     Right Sinus: Maxillary sinus tenderness present. No frontal sinus tenderness.     Left Sinus: Maxillary sinus tenderness present. No frontal sinus tenderness.     Mouth/Throat:     Mouth: Mucous membranes are dry.     Pharynx: Oropharynx is clear. Uvula midline. Posterior oropharyngeal erythema present.  Eyes:     General:        Right eye: No discharge.        Left eye: No discharge.     Conjunctiva/sclera: Conjunctivae normal.     Pupils: Pupils are equal, round, and reactive to light.  Neck:     Musculoskeletal: Normal range of motion and neck supple.     Thyroid: No thyromegaly.     Vascular: No JVD.  Cardiovascular:     Rate and Rhythm: Normal rate and regular rhythm.     Heart sounds: Normal heart sounds. No murmur. No friction rub. No gallop.   Pulmonary:     Effort: Pulmonary effort is normal.     Breath sounds: Normal breath sounds. No decreased air movement. No decreased breath sounds, wheezing, rhonchi or rales.  Abdominal:     General: Bowel  sounds are normal.     Palpations: Abdomen is soft. There is no mass.     Tenderness: There is no abdominal tenderness. There is no guarding.  Musculoskeletal: Normal range of motion.  Lymphadenopathy:     Head:     Right side of head: No submandibular adenopathy.     Left side of head: No submandibular adenopathy.     Cervical: No cervical adenopathy.  Skin:    General: Skin is warm and dry.  Neurological:     Mental Status: She is alert.     Deep Tendon Reflexes: Reflexes are normal and symmetric.     BP 130/70   Pulse 72   Temp 98 F (36.7 C) (Oral)   Ht 5\' 6"  (1.676 m)   Wt 176 lb (79.8 kg)   SpO2 99%   BMI 28.41 kg/m   Assessment and Plan: 1. Type 2 diabetes mellitus without complication, without long-term current use of insulin (HCC) Chronic.  Controlled.  Will continue Metformin and glipizide.  2. Acute maxillary sinusitis, recurrence not specified Acute.  Persistent.  This has not been alleviated by azithromycin.  Switch to doxycycline 100 mg twice a day.  3. Cough  Patient has continued with cough which is not relieved with Tessalon Perles.  Patient will be given Robitussin-AC 1 teaspoon every 6 hours for 5 days.

## 2018-03-06 DIAGNOSIS — E119 Type 2 diabetes mellitus without complications: Secondary | ICD-10-CM | POA: Diagnosis not present

## 2018-03-08 DIAGNOSIS — E119 Type 2 diabetes mellitus without complications: Secondary | ICD-10-CM | POA: Diagnosis not present

## 2018-03-14 DIAGNOSIS — E1169 Type 2 diabetes mellitus with other specified complication: Secondary | ICD-10-CM | POA: Diagnosis not present

## 2018-03-14 DIAGNOSIS — E785 Hyperlipidemia, unspecified: Secondary | ICD-10-CM | POA: Diagnosis not present

## 2018-03-14 DIAGNOSIS — I10 Essential (primary) hypertension: Secondary | ICD-10-CM | POA: Diagnosis not present

## 2018-03-14 DIAGNOSIS — E119 Type 2 diabetes mellitus without complications: Secondary | ICD-10-CM | POA: Diagnosis not present

## 2018-03-14 DIAGNOSIS — E1159 Type 2 diabetes mellitus with other circulatory complications: Secondary | ICD-10-CM | POA: Diagnosis not present

## 2018-03-20 ENCOUNTER — Other Ambulatory Visit: Payer: Self-pay

## 2018-04-12 DIAGNOSIS — E119 Type 2 diabetes mellitus without complications: Secondary | ICD-10-CM | POA: Diagnosis not present

## 2018-04-12 DIAGNOSIS — Z136 Encounter for screening for cardiovascular disorders: Secondary | ICD-10-CM | POA: Diagnosis not present

## 2018-04-24 DIAGNOSIS — E119 Type 2 diabetes mellitus without complications: Secondary | ICD-10-CM | POA: Diagnosis not present

## 2018-04-24 DIAGNOSIS — E1169 Type 2 diabetes mellitus with other specified complication: Secondary | ICD-10-CM | POA: Diagnosis not present

## 2018-04-24 DIAGNOSIS — I10 Essential (primary) hypertension: Secondary | ICD-10-CM | POA: Diagnosis not present

## 2018-04-24 DIAGNOSIS — E1159 Type 2 diabetes mellitus with other circulatory complications: Secondary | ICD-10-CM | POA: Diagnosis not present

## 2018-04-24 DIAGNOSIS — E785 Hyperlipidemia, unspecified: Secondary | ICD-10-CM | POA: Diagnosis not present

## 2018-04-25 ENCOUNTER — Ambulatory Visit (INDEPENDENT_AMBULATORY_CARE_PROVIDER_SITE_OTHER): Payer: Medicare HMO | Admitting: Family Medicine

## 2018-04-25 ENCOUNTER — Other Ambulatory Visit: Payer: Self-pay

## 2018-04-25 ENCOUNTER — Encounter: Payer: Self-pay | Admitting: Family Medicine

## 2018-04-25 VITALS — BP 130/62 | HR 64 | Ht 66.0 in | Wt 173.0 lb

## 2018-04-25 DIAGNOSIS — I10 Essential (primary) hypertension: Secondary | ICD-10-CM | POA: Diagnosis not present

## 2018-04-25 DIAGNOSIS — E663 Overweight: Secondary | ICD-10-CM | POA: Diagnosis not present

## 2018-04-25 DIAGNOSIS — Z0289 Encounter for other administrative examinations: Secondary | ICD-10-CM | POA: Diagnosis not present

## 2018-04-25 MED ORDER — LISINOPRIL-HYDROCHLOROTHIAZIDE 20-12.5 MG PO TABS: 1.0000 | ORAL_TABLET | Freq: Every day | ORAL | 0 refills | Status: DC

## 2018-04-25 MED ORDER — DILTIAZEM HCL ER 240 MG PO CP24: 240.0000 mg | ORAL_CAPSULE | Freq: Every day | ORAL | 1 refills | Status: DC

## 2018-04-25 NOTE — Progress Notes (Signed)
Date:  04/25/2018   Name:  April [name]   DOB:  [birth year]   MRN:  [id number]   Chief Complaint: Hypertension (fill out bus forms)  Hypertension  This is a chronic problem. The current episode started more than 1 year ago. The problem is unchanged. The problem is controlled. Pertinent negatives include no anxiety, blurred vision, chest pain, headaches, malaise/fatigue, neck pain, orthopnea, palpitations, peripheral edema, PND, shortness of breath or sweats. There are no associated agents to hypertension. Past treatments include ACE inhibitors, calcium channel blockers and diuretics. The current treatment provides moderate improvement. There are no compliance problems.  There is no history of angina, kidney disease, CAD/MI, CVA, heart failure, left ventricular hypertrophy, PVD or retinopathy. There is no history of chronic renal disease, a hypertension causing med or renovascular disease.    Review of Systems  Constitutional: Negative.  Negative for chills, fatigue, fever, malaise/fatigue and unexpected weight change.  HENT: Negative for congestion, ear discharge, ear pain, rhinorrhea, sinus pressure, sneezing and sore throat.   Eyes: Negative for blurred vision, photophobia, pain, discharge, redness and itching.  Respiratory: Negative for cough, shortness of breath, wheezing and stridor.   Cardiovascular: Negative for chest pain, palpitations, orthopnea and PND.  Gastrointestinal: Negative for abdominal pain, blood in stool, constipation, diarrhea, nausea and vomiting.  Endocrine: Negative for cold intolerance, heat intolerance, polydipsia, polyphagia and polyuria.  Genitourinary: Negative for dysuria, flank pain, frequency, hematuria, menstrual problem, pelvic pain, urgency, vaginal bleeding and vaginal discharge.  Musculoskeletal: Negative for arthralgias, back pain, myalgias and neck pain.  Skin: Negative for rash.  Allergic/Immunologic: Negative for environmental allergies and food  allergies.  Neurological: Negative for dizziness, weakness, light-headedness, numbness and headaches.  Hematological: Negative for adenopathy. Does not bruise/bleed easily.  Psychiatric/Behavioral: Negative for dysphoric mood. The patient is not nervous/anxious.     Patient Active Problem List   Diagnosis Date Noted  . Primary osteoarthritis of knee 10/06/2016  . Type 2 diabetes mellitus without complications ([place]) 29/92/4268  . 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 Essential hypertension 06/20/2014  . Routine sports examination 06/20/2014  . Health examination of defined subpopulation 06/20/2014    No Known Allergies  Past Surgical History:  Procedure Laterality Date  . APPENDECTOMY    . gallstones  06/12/2009    Social History   Tobacco Use  . Smoking status: Former Research scientist (life sciences)  . Smokeless tobacco: Never Used  Substance Use Topics  . Alcohol use: No    Alcohol/week: 0.0 standard drinks  . Drug use: No     Medication list has been reviewed and updated.  Current Meds  Medication Sig  . ACCU-CHEK AVIVA PLUS test strip TEST BLOOD SUGAR DAILY AS DIRECTED  . albuterol (PROVENTIL HFA;VENTOLIN HFA) 108 (90 Base) MCG/ACT inhaler Inhale 2 puffs into the lungs every 6 (six) hours as needed for wheezing or shortness of breath.  Marland Kitchen aspirin 81 MG tablet Take 1 tablet by mouth daily.  . colchicine 0.6 MG tablet Take 1 tablet (0.6 mg total) by mouth daily.  Marland Kitchen DILT-XR 240 MG 24 hr capsule TAKE 1 CAPSULE EVERY DAY  . glipiZIDE (GLUCOTROL) 5 MG tablet TAKE 1 TABLET TWICE DAILY  . glucose blood (ACCU-CHEK AVIVA PLUS) test strip USE AS DIRECTED TO TEST BLOOD SUGAR DAILY- E11.9  . lisinopril-hydrochlorothiazide (PRINZIDE,ZESTORETIC) 20-12.5 MG tablet TAKE 1 TABLET EVERY DAY  . meloxicam (MOBIC) 7.5 MG tablet TAKE 1 TABLET(7.5 MG) BY MOUTH DAILY  . metFORMIN (GLUCOPHAGE) 500  MG tablet TAKE 1 TABLET TWICE DAILY  . simvastatin (ZOCOR) 20  MG tablet TAKE 1 TABLET EVERY DAY   Current Facility-Administered Medications for the 04/25/18 encounter (Office Visit) with Juline Patch, MD  Medication  . albuterol (PROVENTIL) (2.5 MG/3ML) 0.083% nebulizer solution 2.5 mg    PHQ 2/9 Scores 11/11/2017 10/06/2016 05/19/2015  PHQ - 2 Score 0 0 0    Physical Exam Vitals signs and nursing note reviewed.  Constitutional:      General: She is not in acute distress.    Appearance: She is not diaphoretic.  HENT:     Head: Normocephalic and atraumatic.     Right Ear: External ear normal.     Left Ear: External ear normal.     Nose: Nose normal.  Eyes:     General:        Right eye: No discharge.        Left eye: No discharge.     Conjunctiva/sclera: Conjunctivae normal.     Pupils: Pupils are equal, round, and reactive to light.  Neck:     Musculoskeletal: Normal range of motion and neck supple.     Thyroid: No thyromegaly.     Vascular: No JVD.  Cardiovascular:     Rate and Rhythm: Normal rate and regular rhythm.     Heart sounds: Normal heart sounds. No murmur. No friction rub. No gallop.   Pulmonary:     Effort: Pulmonary effort is normal.     Breath sounds: Normal breath sounds.  Abdominal:     General: Bowel sounds are normal.     Palpations: Abdomen is soft. There is no mass.     Tenderness: There is no abdominal tenderness. There is no guarding.  Musculoskeletal: Normal range of motion.  Lymphadenopathy:     Cervical: No cervical adenopathy.  Skin:    General: Skin is warm and dry.  Neurological:     Mental Status: She is alert.     Deep Tendon Reflexes: Reflexes are normal and symmetric.     Wt Readings from Last 3 Encounters:  04/25/18 173 lb (78.5 kg)  02/21/18 176 lb (79.8 kg)  02/11/18 179 lb (81.2 kg)    BP 130/62   Pulse 64   Ht 5\' 6"  (1.676 m)   Wt 173 lb (78.5 kg)   BMI 27.92 kg/m   Assessment and Plan: 1. 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  Patient was seen for hypertension by me for which information was provided and signed.  2. Essential hypertension Chronic.  Controlled.  Will continue Zestoretic 20-12 0.5 and diltiazem 240 once a day. - lisinopril-hydrochlorothiazide (PRINZIDE,ZESTORETIC) 20-12.5 MG tablet; Take 1 tablet by mouth daily.  Dispense: 90 tablet; Refill: 0 - diltiazem (DILT-XR) 240 MG 24 hr capsule; Take 1 capsule (240 mg total) by mouth daily.  Dispense: 90 capsule; Refill: 1  3. Overweight (BMI 25.0-29.9) Patient was given Mediterranean diet to improve her coronary artery disease risk and presumably to help her lose weight.

## 2018-04-25 NOTE — Patient Instructions (Signed)
Mediterranean Diet  A Mediterranean diet refers to food and lifestyle choices that are based on the traditions of countries located on the [place]. This way of eating has been shown to help prevent certain conditions and improve outcomes for people who have chronic diseases, like kidney disease and heart disease.  What are tips for following this plan?  Lifestyle   Cook and eat meals together with your family, when possible.   Drink enough fluid to keep your urine clear or pale yellow.   Be physically active every day. This includes:  ? Aerobic exercise like running or swimming.  ? Leisure activities like gardening, walking, or housework.   Get 7-8 hours of sleep each night.   If recommended by your health care provider, drink red wine in moderation. This means 1 glass a day for nonpregnant women and 2 glasses a day for men. A glass of wine equals 5 oz (150 mL).  Reading food labels     Check the serving size of packaged foods. For foods such as rice and pasta, the serving size refers to the amount of cooked product, not dry.   Check the total fat in packaged foods. Avoid foods that have saturated fat or trans fats.   Check the ingredients list for added sugars, such as corn syrup.  Shopping   At the grocery store, buy most of your food from the areas near the walls of the store. This includes:  ? Fresh fruits and vegetables (produce).  ? Grains, beans, nuts, and seeds. Some of these may be available in unpackaged forms or large amounts (in bulk).  ? Fresh seafood.  ? Poultry and eggs.  ? Low-fat dairy products.   Buy whole ingredients instead of prepackaged foods.   Buy fresh fruits and vegetables in-season from local farmers markets.   Buy frozen fruits and vegetables in resealable bags.   If you do not have access to quality fresh seafood, buy precooked frozen shrimp or canned fish, such as tuna, salmon, or sardines.   Buy small amounts of raw or cooked vegetables, salads, or olives from  the deli or salad bar at your store.   Stock your pantry so you always have certain foods on hand, such as olive oil, canned tuna, canned tomatoes, rice, pasta, and beans.  Cooking   Cook foods with extra-virgin olive oil instead of using butter or other vegetable oils.   Have meat as a side dish, and have vegetables or grains as your main dish. This means having meat in small portions or adding small amounts of meat to foods like pasta or stew.   Use beans or vegetables instead of meat in common dishes like chili or lasagna.   Experiment with different cooking methods. Try roasting or broiling vegetables instead of steaming or sauteing them.   Add frozen vegetables to soups, stews, pasta, or rice.   Add nuts or seeds for added healthy fat at each meal. You can add these to yogurt, salads, or vegetable dishes.   Marinate fish or vegetables using olive oil, lemon juice, garlic, and fresh herbs.  Meal planning     Plan to eat 1 vegetarian meal one day each week. Try to work up to 2 vegetarian meals, if possible.   Eat seafood 2 or more times a week.   Have healthy snacks readily available, such as:  ? Vegetable sticks with hummus.  ? Greek yogurt.  ? Fruit and nut trail mix.   Eat balanced   meals throughout the week. This includes:  ? Fruit: 2-3 servings a day  ? Vegetables: 4-5 servings a day  ? Low-fat dairy: 2 servings a day  ? Fish, poultry, or lean meat: 1 serving a day  ? Beans and legumes: 2 or more servings a week  ? Nuts and seeds: 1-2 servings a day  ? Whole grains: 6-8 servings a day  ? Extra-virgin olive oil: 3-4 servings a day   Limit red meat and sweets to only a few servings a month  What are my food choices?   Mediterranean diet  ? Recommended  ? Grains: Whole-grain pasta. Brown rice. Bulgar wheat. Polenta. Couscous. Whole-wheat bread. Oatmeal. Quinoa.  ? Vegetables: Artichokes. Beets. Broccoli. Cabbage. Carrots. Eggplant. Green beans. Chard. Kale. Spinach. Onions. Leeks. Peas. Squash.  Tomatoes. Peppers. Radishes.  ? Fruits: Apples. Apricots. Avocado. Berries. Bananas. Cherries. Dates. Figs. Grapes. Lemons. Melon. Oranges. Peaches. Plums. Pomegranate.  ? Meats and other protein foods: Beans. Almonds. Sunflower seeds. Pine nuts. Peanuts. Cod. Salmon. Scallops. Shrimp. Tuna. Tilapia. Clams. Oysters. Eggs.  ? Dairy: Low-fat milk. Cheese. Greek yogurt.  ? Beverages: Water. Red wine. Herbal tea.  ? Fats and oils: Extra virgin olive oil. Avocado oil. Grape seed oil.  ? Sweets and desserts: Greek yogurt with honey. Baked apples. Poached pears. Trail mix.  ? Seasoning and other foods: Basil. Cilantro. Coriander. Cumin. Mint. Parsley. Sage. Rosemary. Tarragon. Garlic. Oregano. Thyme. Pepper. Balsalmic vinegar. Tahini. Hummus. Tomato sauce. Olives. Mushrooms.  ? Limit these  ? Grains: Prepackaged pasta or rice dishes. Prepackaged cereal with added sugar.  ? Vegetables: Deep fried potatoes (french fries).  ? Fruits: Fruit canned in syrup.  ? Meats and other protein foods: Beef. Pork. Lamb. Poultry with skin. Hot dogs. Bacon.  ? Dairy: Ice cream. Sour cream. Whole milk.  ? Beverages: Juice. Sugar-sweetened soft drinks. Beer. Liquor and spirits.  ? Fats and oils: Butter. Canola oil. Vegetable oil. Beef fat (tallow). Lard.  ? Sweets and desserts: Cookies. Cakes. Pies. Candy.  ? Seasoning and other foods: Mayonnaise. Premade sauces and marinades.  ? The items listed may not be a complete list. Talk with your dietitian about what dietary choices are right for you.  Summary   The Mediterranean diet includes both food and lifestyle choices.   Eat a variety of fresh fruits and vegetables, beans, nuts, seeds, and whole grains.   Limit the amount of red meat and sweets that you eat.   Talk with your health care provider about whether it is safe for you to drink red wine in moderation. This means 1 glass a day for nonpregnant women and 2 glasses a day for men. A glass of wine equals 5 oz (150 mL).  This information  is not intended to replace advice given to you by your health care provider. Make sure you discuss any questions you have with your health care provider.  Document Released: 09/18/2015 Document Revised: 10/21/2015 Document Reviewed: 09/18/2015  Elsevier Interactive Patient Education  2019 [employer]

## 2018-05-12 ENCOUNTER — Other Ambulatory Visit: Payer: Self-pay | Admitting: Family Medicine

## 2018-05-12 DIAGNOSIS — I10 Essential (primary) hypertension: Secondary | ICD-10-CM

## 2018-05-25 IMAGING — DX DG ANKLE COMPLETE 3+V*L*
3 series · 3 of 3 positions shown · non-contrast
Comparison: None.

CLINICAL DATA: Swelling, warmth and redness, pain with movement and
palpation. History of gout, diabetes.

EXAM:
LEFT ANKLE COMPLETE - 3+ VIEW; LEFT FOOT - COMPLETE 3+ VIEW

[ankle ap]
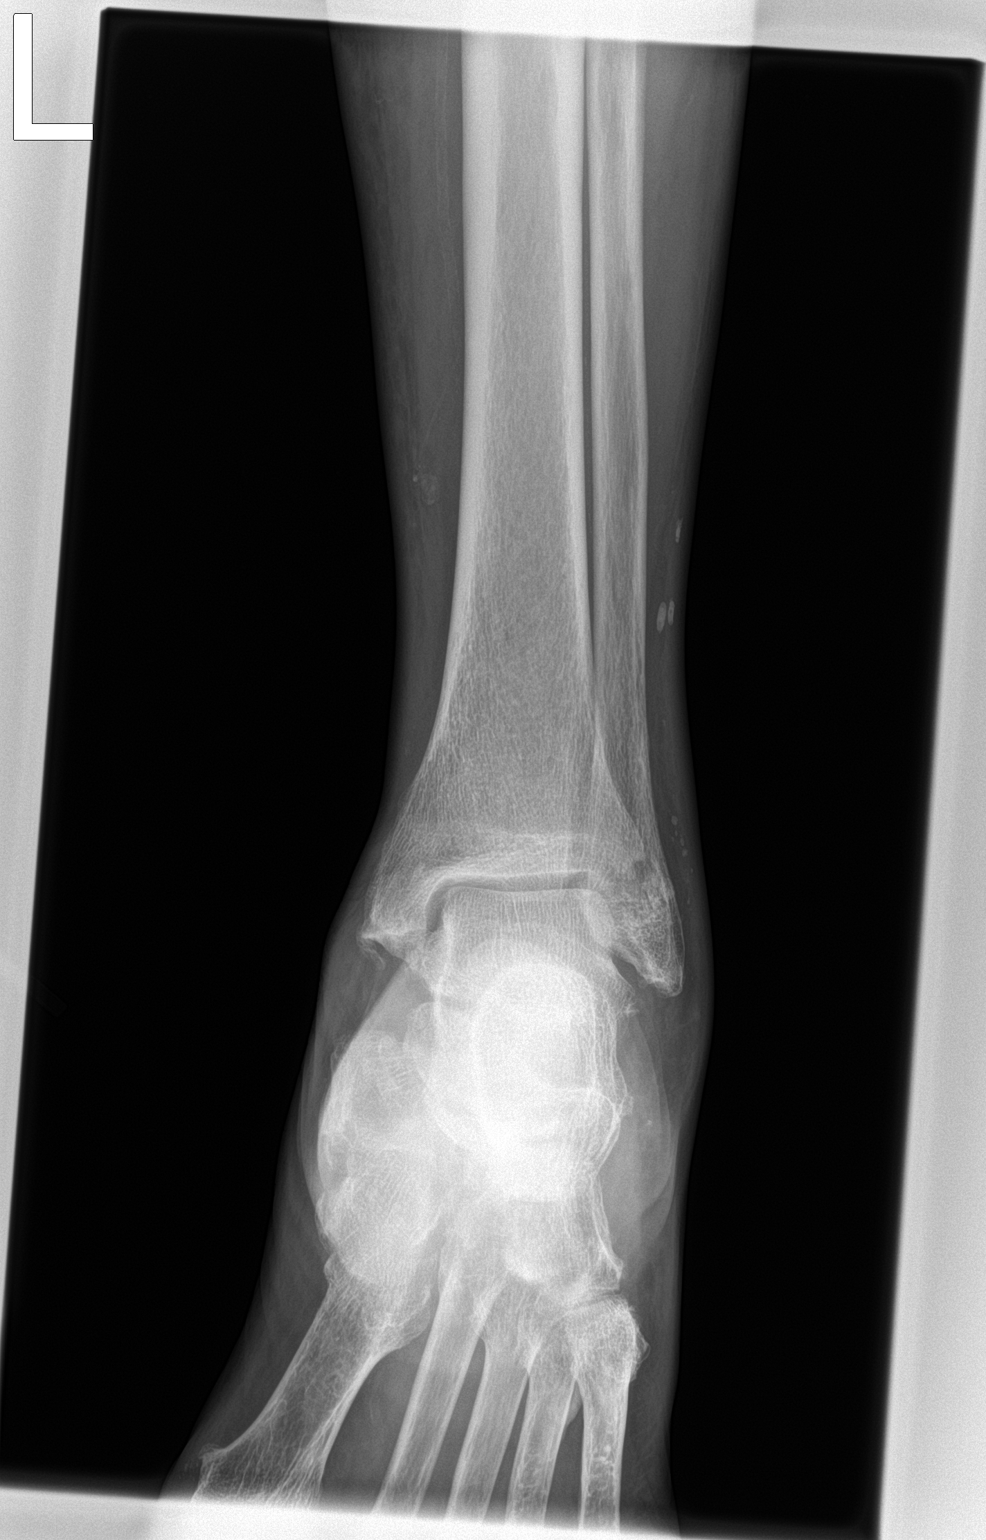

[ankle obl]
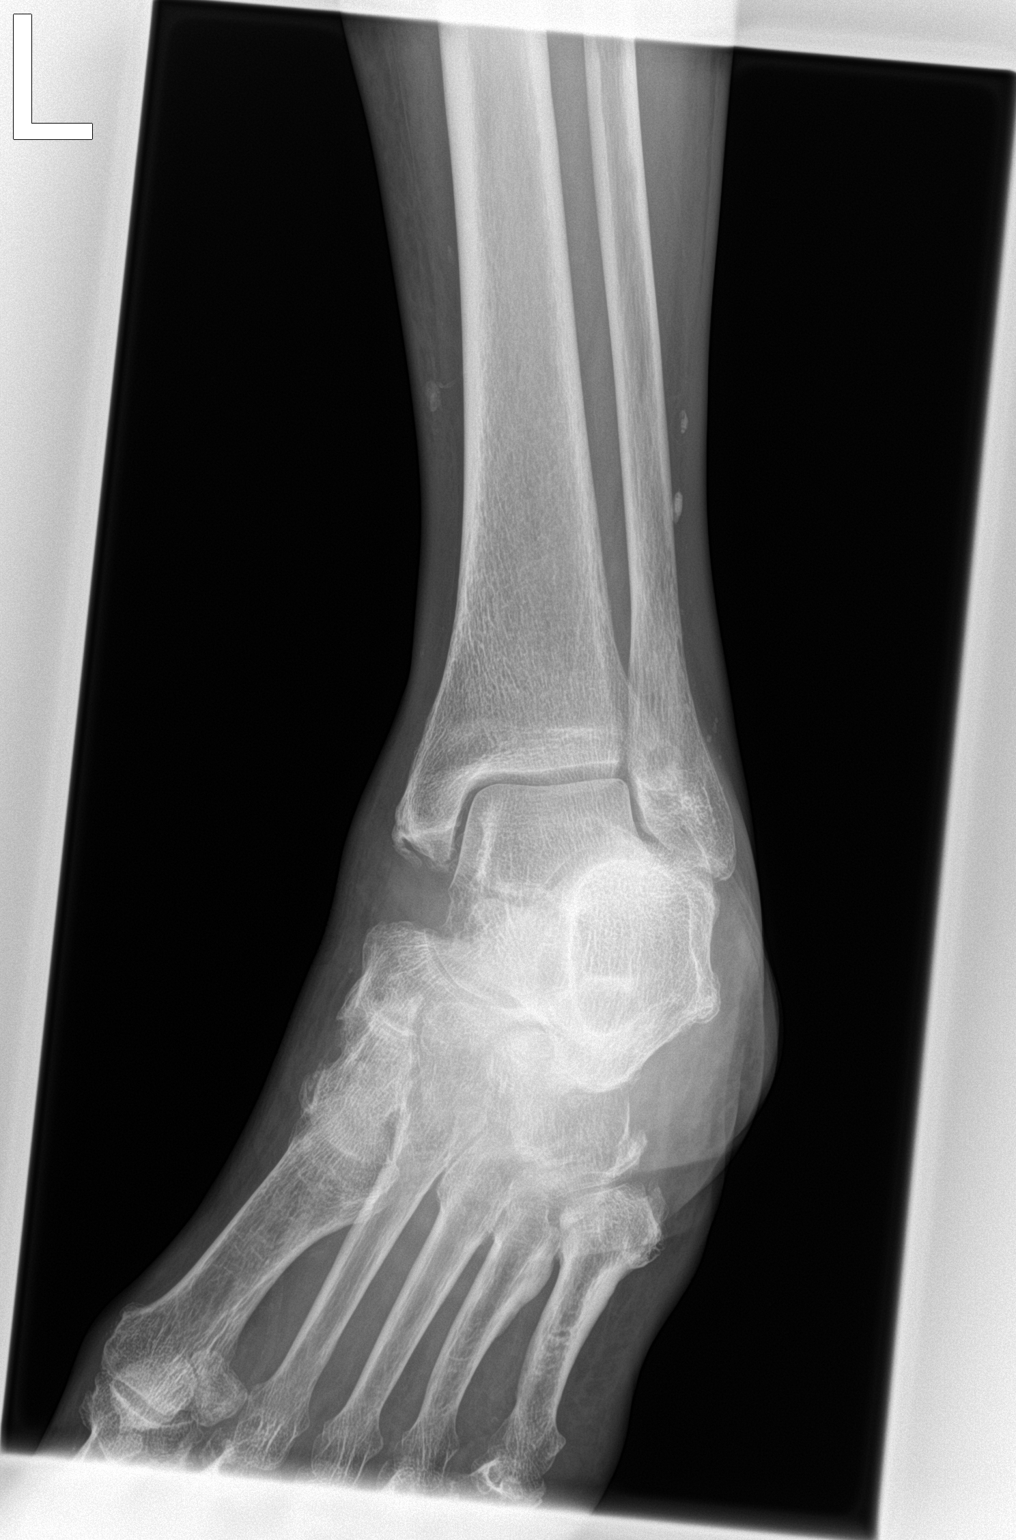

[ankle lat]
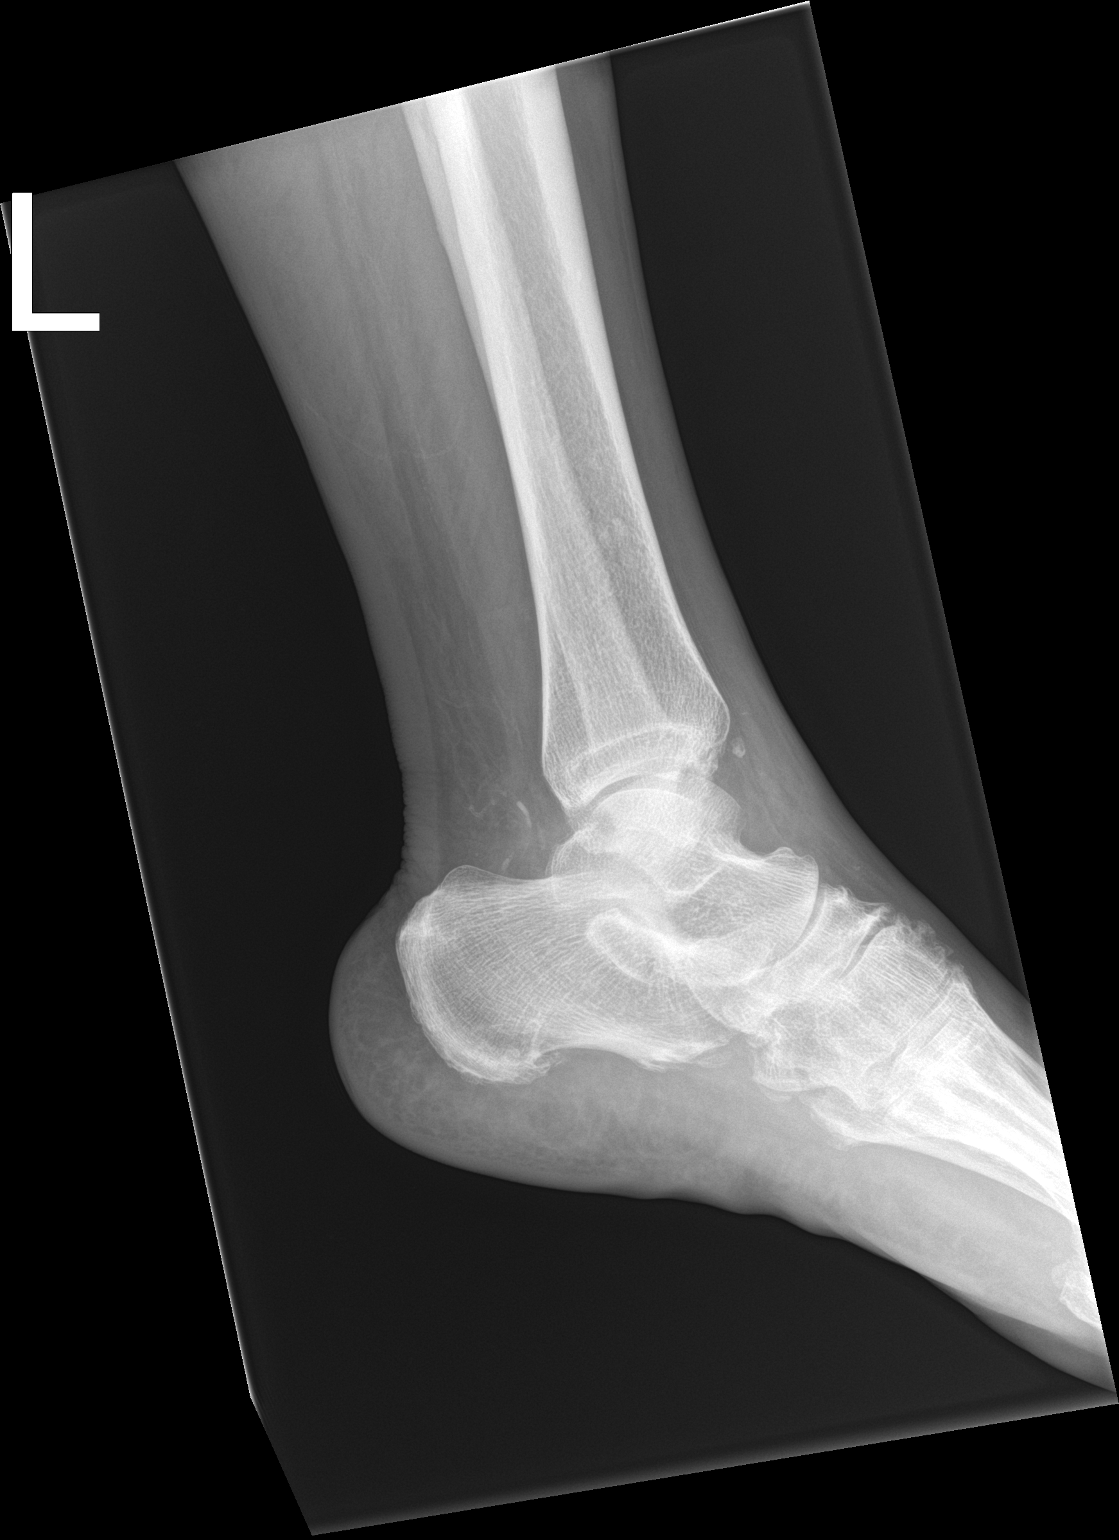

[3 of 3 positions shown; findings below may reference images not displayed]

FINDINGS: No fracture deformity nor dislocation. The ankle mortise appears
congruent and the tibiofibular syndesmosis intact. Moderate midfoot
osteoarthrosis. Small plantar calcaneal spur. Midfoot enthesopathy.
Focal cortical thickening lateral base of fourth metatarsus. No
destructive bony lesions. Soft tissue planes are non-suspicious.
Scattered phleboliths. Mild vascular calcifications.
IMPRESSION: No acute osseous process or destructive bony lesions.

Cortical thickening base of fourth metatarsus most consistent with
stress response.

## 2018-05-29 ENCOUNTER — Other Ambulatory Visit: Payer: Self-pay

## 2018-05-29 DIAGNOSIS — M62838 Other muscle spasm: Secondary | ICD-10-CM

## 2018-05-29 MED ORDER — CYCLOBENZAPRINE HCL 5 MG PO TABS: 5.0000 mg | ORAL_TABLET | Freq: Every day | ORAL | 0 refills | Status: DC

## 2018-06-25 LAB — HEMOGLOBIN A1C: Hemoglobin A1C: 8.7

## 2018-07-05 ENCOUNTER — Other Ambulatory Visit: Payer: Self-pay | Admitting: Family Medicine

## 2018-07-05 DIAGNOSIS — I10 Essential (primary) hypertension: Secondary | ICD-10-CM

## 2018-07-05 DIAGNOSIS — E7849 Other hyperlipidemia: Secondary | ICD-10-CM

## 2018-07-21 ENCOUNTER — Other Ambulatory Visit: Payer: Self-pay | Admitting: Family Medicine

## 2018-07-21 DIAGNOSIS — I10 Essential (primary) hypertension: Secondary | ICD-10-CM

## 2018-08-21 DIAGNOSIS — E119 Type 2 diabetes mellitus without complications: Secondary | ICD-10-CM | POA: Diagnosis not present

## 2018-08-21 DIAGNOSIS — E1169 Type 2 diabetes mellitus with other specified complication: Secondary | ICD-10-CM | POA: Diagnosis not present

## 2018-08-21 DIAGNOSIS — E1159 Type 2 diabetes mellitus with other circulatory complications: Secondary | ICD-10-CM | POA: Diagnosis not present

## 2018-08-21 DIAGNOSIS — E1129 Type 2 diabetes mellitus with other diabetic kidney complication: Secondary | ICD-10-CM | POA: Diagnosis not present

## 2018-08-21 DIAGNOSIS — I10 Essential (primary) hypertension: Secondary | ICD-10-CM | POA: Diagnosis not present

## 2018-08-21 DIAGNOSIS — R809 Proteinuria, unspecified: Secondary | ICD-10-CM | POA: Diagnosis not present

## 2018-08-21 DIAGNOSIS — E785 Hyperlipidemia, unspecified: Secondary | ICD-10-CM | POA: Diagnosis not present

## 2018-08-30 ENCOUNTER — Other Ambulatory Visit: Payer: Self-pay | Admitting: Family Medicine

## 2018-08-30 DIAGNOSIS — I10 Essential (primary) hypertension: Secondary | ICD-10-CM

## 2018-08-30 DIAGNOSIS — E7849 Other hyperlipidemia: Secondary | ICD-10-CM

## 2018-09-08 ENCOUNTER — Other Ambulatory Visit: Payer: Self-pay

## 2018-09-08 DIAGNOSIS — M109 Gout, unspecified: Secondary | ICD-10-CM

## 2018-09-08 MED ORDER — COLCHICINE 0.6 MG PO TABS: 0.6000 mg | ORAL_TABLET | Freq: Every day | ORAL | 0 refills | Status: DC

## 2018-09-08 NOTE — Progress Notes (Unsigned)
Sent in colchicine

## 2018-09-13 ENCOUNTER — Other Ambulatory Visit: Payer: Self-pay | Admitting: Family Medicine

## 2018-09-13 DIAGNOSIS — I10 Essential (primary) hypertension: Secondary | ICD-10-CM

## 2018-10-26 ENCOUNTER — Ambulatory Visit (INDEPENDENT_AMBULATORY_CARE_PROVIDER_SITE_OTHER): Payer: Medicare HMO | Admitting: Family Medicine

## 2018-10-26 ENCOUNTER — Encounter: Payer: Self-pay | Admitting: Family Medicine

## 2018-10-26 ENCOUNTER — Ambulatory Visit: Payer: Medicare HMO | Admitting: Family Medicine

## 2018-10-26 ENCOUNTER — Other Ambulatory Visit: Payer: Self-pay

## 2018-10-26 VITALS — BP 120/62 | HR 64 | Ht 66.0 in | Wt 173.0 lb

## 2018-10-26 DIAGNOSIS — E663 Overweight: Secondary | ICD-10-CM

## 2018-10-26 DIAGNOSIS — I10 Essential (primary) hypertension: Secondary | ICD-10-CM

## 2018-10-26 DIAGNOSIS — R69 Illness, unspecified: Secondary | ICD-10-CM | POA: Diagnosis not present

## 2018-10-26 DIAGNOSIS — E7849 Other hyperlipidemia: Secondary | ICD-10-CM

## 2018-10-26 DIAGNOSIS — Z23 Encounter for immunization: Secondary | ICD-10-CM

## 2018-10-26 DIAGNOSIS — M1710 Unilateral primary osteoarthritis, unspecified knee: Secondary | ICD-10-CM | POA: Diagnosis not present

## 2018-10-26 MED ORDER — MELOXICAM 7.5 MG PO TABS: ORAL_TABLET | ORAL | 5 refills | Status: DC

## 2018-10-26 MED ORDER — LISINOPRIL-HYDROCHLOROTHIAZIDE 20-12.5 MG PO TABS: 1.0000 | ORAL_TABLET | Freq: Every day | ORAL | 1 refills | Status: DC

## 2018-10-26 MED ORDER — SIMVASTATIN 20 MG PO TABS: 20.0000 mg | ORAL_TABLET | Freq: Every day | ORAL | 1 refills | Status: DC

## 2018-10-26 MED ORDER — DILTIAZEM HCL ER 240 MG PO CP24: 240.0000 mg | ORAL_CAPSULE | Freq: Every day | ORAL | 1 refills | Status: DC

## 2018-10-26 NOTE — Progress Notes (Signed)
Date:  10/26/2018   Name:  April [name]   DOB:  01-17-1941   MRN:  [id number]   Chief Complaint: Hypertension, Hyperlipidemia, Knee Pain (refill meloxicam), and influ vacc need  Hypertension This is a chronic problem. The current episode started more than 1 year ago. The problem has been gradually improving since onset. The problem is controlled. Pertinent negatives include no anxiety, blurred vision, chest pain, headaches, malaise/fatigue, neck pain, orthopnea, palpitations, peripheral edema, PND, shortness of breath or sweats. There are no associated agents to hypertension. Risk factors for coronary artery disease include dyslipidemia and obesity. Past treatments include ACE inhibitors and diuretics. The current treatment provides moderate improvement. There are no compliance problems.  There is no history of angina, kidney disease, CAD/MI, CVA, heart failure, left ventricular hypertrophy or PVD. There is no history of chronic renal disease or a hypertension causing med.  Hyperlipidemia This is a chronic problem. The current episode started more than 1 year ago. The problem is controlled. Recent lipid tests were reviewed and are normal. She has no history of chronic renal disease, diabetes, hypothyroidism, liver disease, obesity or nephrotic syndrome. Factors aggravating her hyperlipidemia include thiazides. Pertinent negatives include no chest pain, focal sensory loss, focal weakness, leg pain, myalgias or shortness of breath. Current antihyperlipidemic treatment includes statins. The current treatment provides moderate improvement of lipids. There are no compliance problems.  There are no known risk factors for coronary artery disease.  Knee Pain  Incident onset: chronic. The pain is present in the left leg. The pain is moderate. The pain has been intermittent since onset. Pertinent negatives include no numbness. The symptoms are aggravated by movement. She has tried NSAIDs for the symptoms. The  treatment provided moderate relief.    Review of Systems  Constitutional: Negative.  Negative for chills, fatigue, fever, malaise/fatigue and unexpected weight change.  HENT: Negative for congestion, ear discharge, ear pain, rhinorrhea, sinus pressure, sneezing and sore throat.   Eyes: Negative for blurred vision, photophobia, pain, discharge, redness and itching.  Respiratory: Negative for cough, shortness of breath, wheezing and stridor.   Cardiovascular: Negative for chest pain, palpitations, orthopnea and PND.  Gastrointestinal: Negative for abdominal pain, blood in stool, constipation, diarrhea, nausea and vomiting.  Endocrine: Negative for cold intolerance, heat intolerance, polydipsia, polyphagia and polyuria.  Genitourinary: Negative for dysuria, flank pain, frequency, hematuria, menstrual problem, pelvic pain, urgency, vaginal bleeding and vaginal discharge.  Musculoskeletal: Negative for arthralgias, back pain, myalgias and neck pain.  Skin: Negative for rash.  Allergic/Immunologic: Negative for environmental allergies and food allergies.  Neurological: Negative for dizziness, focal weakness, weakness, light-headedness, numbness and headaches.  Hematological: Negative for adenopathy. Does not bruise/bleed easily.  Psychiatric/Behavioral: Negative for dysphoric mood. The patient is not nervous/anxious.     Patient Active Problem List   Diagnosis Date Noted  . Primary osteoarthritis of knee 10/06/2016  . 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 Essential hypertension 06/20/2014  . Routine sports examination 06/20/2014  . Health examination of defined subpopulation 06/20/2014    No Known Allergies  Past Surgical History:  Procedure Laterality Date  . APPENDECTOMY    . gallstones  06/12/2009    Social History   Tobacco Use  . Smoking status: Former  Research scientist (life sciences)  . Smokeless tobacco: Never Used  Substance Use Topics  . Alcohol use: No    Alcohol/week: 0.0 standard drinks  . Drug use:  No     Medication list has been reviewed and updated.  Current Meds  Medication Sig  . ACCU-CHEK AVIVA PLUS test strip TEST BLOOD SUGAR DAILY AS DIRECTED  . albuterol (PROVENTIL HFA;VENTOLIN HFA) 108 (90 Base) MCG/ACT inhaler Inhale 2 puffs into the lungs every 6 (six) hours as needed for wheezing or shortness of breath.  Marland Kitchen aspirin 81 MG tablet Take 1 tablet by mouth daily.  . colchicine 0.6 MG tablet Take 1 tablet (0.6 mg total) by mouth daily.  . cyclobenzaprine (FLEXERIL) 5 MG tablet Take 1 tablet (5 mg total) by mouth at bedtime.  Marland Kitchen DILT-XR 240 MG 24 hr capsule TAKE 1 CAPSULE EVERY DAY  . glipiZIDE (GLUCOTROL) 5 MG tablet TAKE 1 TABLET TWICE DAILY  . glucose blood (ACCU-CHEK AVIVA PLUS) test strip USE AS DIRECTED TO TEST BLOOD SUGAR DAILY- E11.9  . lisinopril-hydrochlorothiazide (ZESTORETIC) 20-12.5 MG tablet TAKE 1 TABLET EVERY DAY  . meloxicam (MOBIC) 7.5 MG tablet TAKE 1 TABLET(7.5 MG) BY MOUTH DAILY  . metFORMIN (GLUCOPHAGE) 500 MG tablet TAKE 1 TABLET TWICE DAILY  . simvastatin (ZOCOR) 20 MG tablet TAKE 1 TABLET EVERY DAY   Current Facility-Administered Medications for the 10/26/18 encounter (Office Visit) with Juline Patch, MD  Medication  . albuterol (PROVENTIL) (2.5 MG/3ML) 0.083% nebulizer solution 2.5 mg    PHQ 2/9 Scores 10/26/2018 11/11/2017 10/06/2016 05/19/2015  PHQ - 2 Score 0 0 0 0  PHQ- 9 Score 0 - - -    BP Readings from Last 3 Encounters:  10/26/18 120/62  04/25/18 130/62  02/21/18 130/70    Physical Exam Vitals signs and nursing note reviewed.  Constitutional:      Appearance: She is well-developed.  HENT:     Head: Normocephalic.     Right Ear: Tympanic membrane, ear canal and external ear normal.     Left Ear: Tympanic membrane, ear canal and external ear normal.  Eyes:     General: Lids are everted, no foreign  bodies appreciated. No scleral icterus.       Left eye: No foreign body or hordeolum.     Conjunctiva/sclera: Conjunctivae normal.     Right eye: Right conjunctiva is not injected.     Left eye: Left conjunctiva is not injected.     Pupils: Pupils are equal, round, and reactive to light.  Neck:     Musculoskeletal: Normal range of motion and neck supple.     Thyroid: No thyromegaly.     Vascular: No JVD.     Trachea: No tracheal deviation.  Cardiovascular:     Rate and Rhythm: Normal rate and regular rhythm.     Heart sounds: Normal heart sounds, S1 normal and S2 normal. No murmur. No systolic murmur. No diastolic murmur. No friction rub. No gallop. No S3 or S4 sounds.   Pulmonary:     Effort: Pulmonary effort is normal. No respiratory distress.     Breath sounds: Normal breath sounds. No wheezing or rales.  Abdominal:     General: Bowel sounds are normal.     Palpations: Abdomen is soft. There is no mass.     Tenderness: There is no abdominal tenderness. There is no guarding or rebound.  Musculoskeletal: Normal range of motion.     Left knee: Tenderness found.     Right lower leg: No edema.     Left lower leg: No edema.  Lymphadenopathy:     Cervical: No cervical adenopathy.  Skin:    General: Skin  is warm.     Findings: No rash.  Neurological:     Mental Status: She is alert and oriented to person, place, and time.     Cranial Nerves: No cranial nerve deficit.     Deep Tendon Reflexes: Reflexes normal.  Psychiatric:        Mood and Affect: Mood is not anxious or depressed.     Wt Readings from Last 3 Encounters:  10/26/18 173 lb (78.5 kg)  04/25/18 173 lb (78.5 kg)  02/21/18 176 lb (79.8 kg)    BP 120/62   Pulse 64   Ht 5\' 6"  (1.676 m)   Wt 173 lb (78.5 kg)   BMI 27.92 kg/m   Assessment and Plan:   1. Essential hypertension Chronic.  Controlled.  Continue diltiazem XR 240 mg once a day, lisinopril hydrochlorothiazide 20-12.5 mg.  We will check a renal function  panel. - Renal Function Panel - diltiazem (DILT-XR) 240 MG 24 hr capsule; Take 1 capsule (240 mg total) by mouth daily.  Dispense: 90 capsule; Refill: 1 - lisinopril-hydrochlorothiazide (ZESTORETIC) 20-12.5 MG tablet; Take 1 tablet by mouth daily.  Dispense: 90 tablet; Refill: 1  2. Primary osteoarthritis of knee, unspecified laterality Patient has a persistent of osteoarthritis of the left knee.  Patient is currently on meloxicam 7.5 mg once a day.  Patient is tolerating this well we will continue at this time. - meloxicam (MOBIC) 7.5 MG tablet; TAKE 1 TABLET(7.5 MG) BY MOUTH DAILY  Dispense: 30 tablet; Refill: 5  3. Familial multiple lipoprotein-type hyperlipidemia Chronic.  Controlled.  Continue simvastatin 20 mg once a day.  Will check lipid panel. - Lipid Panel With LDL/HDL Ratio - simvastatin (ZOCOR) 20 MG tablet; Take 1 tablet (20 mg total) by mouth daily.  Dispense: 90 tablet; Refill: 1  4. Taking medication for chronic disease Patient currently taking a statin and we will check her hepatic function as well the fact that she is on an NSAID and we will follow her GFR and creatinine given her age and on an NSAID. - Hepatic function panel  5. Overweight (BMI 25.0-29.9) Health risks of being over weight were discussed and patient was counseled on weight loss options and exercise.  6. Influenza vaccine needed Discussed and administered. - Flu Vaccine QUAD High Dose(Fluad)

## 2018-10-27 LAB — RENAL FUNCTION PANEL
Albumin: 4.3 g/dL (ref 3.7–4.7)
BUN/Creatinine Ratio: 10 — ABNORMAL LOW (ref 12–28)
BUN: 10 mg/dL (ref 8–27)
CO2: 27 mmol/L (ref 20–29)
Calcium: 9.3 mg/dL (ref 8.7–10.3)
Chloride: 100 mmol/L (ref 96–106)
Creatinine, Ser: 1 mg/dL (ref 0.57–1.00)
GFR calc Af Amer: 62 mL/min/{1.73_m2} (ref >59)
GFR calc non Af Amer: 54 mL/min/{1.73_m2} — ABNORMAL LOW (ref >59)
Glucose: 167 mg/dL — ABNORMAL HIGH (ref 65–99)
Phosphorus: 2.7 mg/dL — ABNORMAL LOW (ref 3.0–4.3)
Potassium: 3.8 mmol/L (ref 3.5–5.2)
Sodium: 140 mmol/L (ref 134–144)

## 2018-10-27 LAB — LIPID PANEL WITH LDL/HDL RATIO
Cholesterol, Total: 130 mg/dL (ref 100–199)
HDL: 51 mg/dL (ref >39)
LDL Chol Calc (NIH): 60 mg/dL (ref 0–99)
LDL/HDL Ratio: 1.2 ratio (ref 0.0–3.2)
Triglycerides: 105 mg/dL (ref 0–149)
VLDL Cholesterol Cal: 19 mg/dL (ref 5–40)

## 2018-10-27 LAB — HEPATIC FUNCTION PANEL
ALT: 37 IU/L — ABNORMAL HIGH (ref 0–32)
AST: 52 IU/L — ABNORMAL HIGH (ref 0–40)
Alkaline Phosphatase: 63 IU/L (ref 39–117)
Bilirubin Total: 0.4 mg/dL (ref 0.0–1.2)
Bilirubin, Direct: 0.15 mg/dL (ref 0.00–0.40)
Total Protein: 6.8 g/dL (ref 6.0–8.5)

## 2018-11-13 ENCOUNTER — Other Ambulatory Visit: Payer: Self-pay

## 2018-11-13 DIAGNOSIS — Z1231 Encounter for screening mammogram for malignant neoplasm of breast: Secondary | ICD-10-CM

## 2018-11-13 NOTE — Progress Notes (Unsigned)
sched mammo in [place] for [facility] Nov 16th @ 12:40 pm

## 2018-11-16 ENCOUNTER — Other Ambulatory Visit: Payer: Self-pay | Admitting: Family Medicine

## 2018-11-16 DIAGNOSIS — E7849 Other hyperlipidemia: Secondary | ICD-10-CM

## 2018-11-16 DIAGNOSIS — I10 Essential (primary) hypertension: Secondary | ICD-10-CM

## 2018-12-01 DIAGNOSIS — I10 Essential (primary) hypertension: Secondary | ICD-10-CM | POA: Diagnosis not present

## 2018-12-01 DIAGNOSIS — R809 Proteinuria, unspecified: Secondary | ICD-10-CM | POA: Diagnosis not present

## 2018-12-01 DIAGNOSIS — E1129 Type 2 diabetes mellitus with other diabetic kidney complication: Secondary | ICD-10-CM | POA: Diagnosis not present

## 2018-12-01 DIAGNOSIS — E785 Hyperlipidemia, unspecified: Secondary | ICD-10-CM | POA: Diagnosis not present

## 2018-12-01 DIAGNOSIS — E1159 Type 2 diabetes mellitus with other circulatory complications: Secondary | ICD-10-CM | POA: Diagnosis not present

## 2018-12-01 DIAGNOSIS — E1169 Type 2 diabetes mellitus with other specified complication: Secondary | ICD-10-CM | POA: Diagnosis not present

## 2018-12-01 DIAGNOSIS — E119 Type 2 diabetes mellitus without complications: Secondary | ICD-10-CM | POA: Diagnosis not present

## 2018-12-08 ENCOUNTER — Other Ambulatory Visit: Payer: Medicare HMO

## 2018-12-08 ENCOUNTER — Other Ambulatory Visit: Payer: Self-pay

## 2018-12-08 DIAGNOSIS — R748 Abnormal levels of other serum enzymes: Secondary | ICD-10-CM

## 2018-12-08 NOTE — Progress Notes (Signed)
Recheck liver enzymes

## 2018-12-09 LAB — HEPATIC FUNCTION PANEL
ALT: 28 IU/L (ref 0–32)
AST: 47 IU/L — ABNORMAL HIGH (ref 0–40)
Albumin: 4.2 g/dL (ref 3.7–4.7)
Alkaline Phosphatase: 65 IU/L (ref 39–117)
Bilirubin Total: 0.4 mg/dL (ref 0.0–1.2)
Bilirubin, Direct: 0.15 mg/dL (ref 0.00–0.40)
Total Protein: 6.6 g/dL (ref 6.0–8.5)

## 2018-12-25 ENCOUNTER — Other Ambulatory Visit: Payer: Self-pay

## 2018-12-25 ENCOUNTER — Encounter (INDEPENDENT_AMBULATORY_CARE_PROVIDER_SITE_OTHER): Payer: Self-pay

## 2018-12-25 ENCOUNTER — Ambulatory Visit
Admission: RE | Admit: 2018-12-25 | Discharge: 2018-12-25 | Disposition: A | Discharge status: Home / Self Care | Payer: Medicare HMO | Source: Ambulatory Visit | Attending: Family Medicine | Admitting: Family Medicine

## 2018-12-25 ENCOUNTER — Ambulatory Visit: Payer: Medicare HMO

## 2018-12-25 DIAGNOSIS — Z1231 Encounter for screening mammogram for malignant neoplasm of breast: Secondary | ICD-10-CM | POA: Diagnosis not present

## 2019-01-03 ENCOUNTER — Other Ambulatory Visit: Payer: Self-pay | Admitting: Family Medicine

## 2019-01-03 DIAGNOSIS — I10 Essential (primary) hypertension: Secondary | ICD-10-CM

## 2019-01-22 ENCOUNTER — Other Ambulatory Visit: Payer: Self-pay | Admitting: Family Medicine

## 2019-01-22 DIAGNOSIS — I10 Essential (primary) hypertension: Secondary | ICD-10-CM

## 2019-01-22 DIAGNOSIS — E7849 Other hyperlipidemia: Secondary | ICD-10-CM

## 2019-01-29 ENCOUNTER — Other Ambulatory Visit: Payer: Self-pay

## 2019-01-29 DIAGNOSIS — M1710 Unilateral primary osteoarthritis, unspecified knee: Secondary | ICD-10-CM

## 2019-01-29 MED ORDER — TIZANIDINE HCL 4 MG PO CAPS: 4.0000 mg | ORAL_CAPSULE | Freq: Every day | ORAL | 1 refills | Status: DC

## 2019-01-29 NOTE — Progress Notes (Unsigned)
Sent in tizanidine to [facility]

## 2019-02-05 ENCOUNTER — Other Ambulatory Visit: Payer: Self-pay

## 2019-02-05 DIAGNOSIS — M1710 Unilateral primary osteoarthritis, unspecified knee: Secondary | ICD-10-CM

## 2019-02-05 MED ORDER — TIZANIDINE HCL 4 MG PO TABS: 4.0000 mg | ORAL_TABLET | Freq: Every day | ORAL | 0 refills | Status: DC

## 2019-02-05 NOTE — Progress Notes (Unsigned)
Changed capsules to tablets/ Tizanadine- sent to [facility]

## 2019-03-05 ENCOUNTER — Other Ambulatory Visit: Payer: Self-pay

## 2019-03-05 ENCOUNTER — Other Ambulatory Visit: Payer: Self-pay | Admitting: Family Medicine

## 2019-03-05 DIAGNOSIS — M1710 Unilateral primary osteoarthritis, unspecified knee: Secondary | ICD-10-CM

## 2019-03-05 DIAGNOSIS — I10 Essential (primary) hypertension: Secondary | ICD-10-CM

## 2019-03-05 MED ORDER — TIZANIDINE HCL 4 MG PO TABS: 4.0000 mg | ORAL_TABLET | Freq: Every day | ORAL | 0 refills | Status: DC

## 2019-03-05 NOTE — Progress Notes (Unsigned)
Tizanidine sent in [place]

## 2019-04-03 ENCOUNTER — Other Ambulatory Visit: Payer: Self-pay | Admitting: Family Medicine

## 2019-04-03 ENCOUNTER — Other Ambulatory Visit: Payer: Self-pay

## 2019-04-03 DIAGNOSIS — E7849 Other hyperlipidemia: Secondary | ICD-10-CM

## 2019-04-03 DIAGNOSIS — I10 Essential (primary) hypertension: Secondary | ICD-10-CM

## 2019-04-03 DIAGNOSIS — M1710 Unilateral primary osteoarthritis, unspecified knee: Secondary | ICD-10-CM

## 2019-04-03 MED ORDER — TIZANIDINE HCL 4 MG PO TABS: 4.0000 mg | ORAL_TABLET | Freq: Every day | ORAL | 0 refills | Status: DC

## 2019-04-06 DIAGNOSIS — E1169 Type 2 diabetes mellitus with other specified complication: Secondary | ICD-10-CM | POA: Diagnosis not present

## 2019-04-06 DIAGNOSIS — R809 Proteinuria, unspecified: Secondary | ICD-10-CM | POA: Diagnosis not present

## 2019-04-06 DIAGNOSIS — I10 Essential (primary) hypertension: Secondary | ICD-10-CM | POA: Diagnosis not present

## 2019-04-06 DIAGNOSIS — E1159 Type 2 diabetes mellitus with other circulatory complications: Secondary | ICD-10-CM | POA: Diagnosis not present

## 2019-04-06 DIAGNOSIS — E1129 Type 2 diabetes mellitus with other diabetic kidney complication: Secondary | ICD-10-CM | POA: Diagnosis not present

## 2019-04-06 DIAGNOSIS — E785 Hyperlipidemia, unspecified: Secondary | ICD-10-CM | POA: Diagnosis not present

## 2019-04-06 LAB — HEMOGLOBIN A1C: Hemoglobin A1C: 9.3

## 2019-04-10 ENCOUNTER — Encounter: Payer: Self-pay | Admitting: Family Medicine

## 2019-04-10 ENCOUNTER — Other Ambulatory Visit: Payer: Self-pay

## 2019-04-10 ENCOUNTER — Ambulatory Visit (INDEPENDENT_AMBULATORY_CARE_PROVIDER_SITE_OTHER): Payer: Medicare HMO | Admitting: Family Medicine

## 2019-04-10 VITALS — BP 110/60 | HR 64 | Ht 66.0 in | Wt 177.0 lb

## 2019-04-10 DIAGNOSIS — R7401 Elevation of levels of liver transaminase levels: Secondary | ICD-10-CM | POA: Diagnosis not present

## 2019-04-10 DIAGNOSIS — M1710 Unilateral primary osteoarthritis, unspecified knee: Secondary | ICD-10-CM

## 2019-04-10 DIAGNOSIS — I10 Essential (primary) hypertension: Secondary | ICD-10-CM | POA: Diagnosis not present

## 2019-04-10 DIAGNOSIS — E7849 Other hyperlipidemia: Secondary | ICD-10-CM

## 2019-04-10 DIAGNOSIS — E663 Overweight: Secondary | ICD-10-CM | POA: Diagnosis not present

## 2019-04-10 MED ORDER — LISINOPRIL-HYDROCHLOROTHIAZIDE 20-12.5 MG PO TABS: 1.0000 | ORAL_TABLET | Freq: Every day | ORAL | 1 refills | Status: DC

## 2019-04-10 MED ORDER — TIZANIDINE HCL 4 MG PO TABS: 4.0000 mg | ORAL_TABLET | Freq: Every day | ORAL | 5 refills | Status: DC

## 2019-04-10 MED ORDER — DILTIAZEM HCL ER 240 MG PO CP24: 240.0000 mg | ORAL_CAPSULE | Freq: Every day | ORAL | 1 refills | Status: DC

## 2019-04-10 MED ORDER — SIMVASTATIN 20 MG PO TABS: 20.0000 mg | ORAL_TABLET | Freq: Every day | ORAL | 1 refills | Status: DC

## 2019-04-10 NOTE — Progress Notes (Signed)
Date:  04/10/2019   Name:  April [name]   DOB:  1941-01-22   MRN:  [id number]   Chief Complaint: Hypertension, Hyperlipidemia, Gout, and Osteoarthritis  Hypertension This is a chronic problem. The current episode started more than 1 year ago. The problem has been gradually improving since onset. The problem is controlled. Pertinent negatives include no anxiety, blurred vision, chest pain, headaches, malaise/fatigue, neck pain, orthopnea, palpitations, peripheral edema, PND, shortness of breath or sweats. Risk factors for coronary artery disease include dyslipidemia. Past treatments include calcium channel blockers, ACE inhibitors and diuretics. The current treatment provides moderate improvement. There are no compliance problems.  There is no history of angina, kidney disease, CAD/MI, CVA, heart failure, left ventricular hypertrophy, PVD or retinopathy. There is no history of chronic renal disease, a hypertension causing med or renovascular disease.  Hyperlipidemia This is a chronic problem. The current episode started more than 1 year ago. The problem is controlled. Recent lipid tests were reviewed and are normal. She has no history of chronic renal disease. Pertinent negatives include no chest pain, focal sensory loss, focal weakness, leg pain, myalgias or shortness of breath. Current antihyperlipidemic treatment includes statins. The current treatment provides moderate improvement of lipids. There are no compliance problems.  Risk factors for coronary artery disease include dyslipidemia and hypertension.    Lab Results  Component Value Date   CREATININE 1.00 10/26/2018   BUN 10 10/26/2018   NA 140 10/26/2018   K 3.8 10/26/2018   CL 100 10/26/2018   CO2 27 10/26/2018   Lab Results  Component Value Date   CHOL 130 10/26/2018   HDL 51 10/26/2018   LDLCALC 60 10/26/2018   TRIG 105 10/26/2018   CHOLHDL 3.2 10/06/2016   No results found for: TSH Lab Results  Component Value Date   HGBA1C 9.3 04/06/2019     Review of Systems  Constitutional: Negative.  Negative for chills, fatigue, fever, malaise/fatigue and unexpected weight change.  HENT: Negative for congestion, ear discharge, ear pain, rhinorrhea, sinus pressure, sneezing and sore throat.   Eyes: Negative for blurred vision, photophobia, pain, discharge, redness and itching.  Respiratory: Negative for cough, shortness of breath, wheezing and stridor.   Cardiovascular: Negative for chest pain, palpitations, orthopnea and PND.  Gastrointestinal: Negative for abdominal pain, blood in stool, constipation, diarrhea, nausea and vomiting.  Endocrine: Negative for cold intolerance, heat intolerance, polydipsia, polyphagia and polyuria.  Genitourinary: Negative for dysuria, flank pain, frequency, hematuria, menstrual problem, pelvic pain, urgency, vaginal bleeding and vaginal discharge.  Musculoskeletal: Negative for arthralgias, back pain, myalgias and neck pain.  Skin: Negative for rash.  Allergic/Immunologic: Negative for environmental allergies and food allergies.  Neurological: Negative for dizziness, focal weakness, weakness, light-headedness, numbness and headaches.  Hematological: Negative for adenopathy. Does not bruise/bleed easily.  Psychiatric/Behavioral: Negative for dysphoric mood. The patient is not nervous/anxious.     Patient Active Problem List   Diagnosis Date Noted  . Primary osteoarthritis of knee 10/06/2016  . Type 2 diabetes mellitus without complications ([place]) 123XX123  . 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 Essential hypertension 06/20/2014  . Routine sports examination 06/20/2014  . Health examination of defined subpopulation 06/20/2014    No Known Allergies  Past Surgical History:  Procedure Laterality Date  . APPENDECTOMY    . gallstones  06/12/2009    Social History   Tobacco Use  . Smoking status: Former Research scientist (life sciences)  .  Smokeless tobacco: Never Used  Substance Use Topics  . Alcohol use: No    Alcohol/week: 0.0 standard drinks  . Drug use: No     Medication list has been reviewed and updated.  Current Meds  Medication Sig  . ACCU-CHEK AVIVA PLUS test strip TEST BLOOD SUGAR DAILY AS DIRECTED  . albuterol (PROVENTIL HFA;VENTOLIN HFA) 108 (90 Base) MCG/ACT inhaler Inhale 2 puffs into the lungs every 6 (six) hours as needed for wheezing or shortness of breath.  Marland Kitchen aspirin 81 MG tablet Take 1 tablet by mouth daily.  . colchicine 0.6 MG tablet Take 1 tablet (0.6 mg total) by mouth daily.  Marland Kitchen DILT-XR 240 MG 24 hr capsule TAKE 1 CAPSULE EVERY DAY  . glipiZIDE (GLUCOTROL) 5 MG tablet TAKE 1 TABLET TWICE DAILY  . glucose blood (ACCU-CHEK AVIVA PLUS) test strip USE AS DIRECTED TO TEST BLOOD SUGAR DAILY- E11.9  . lisinopril-hydrochlorothiazide (ZESTORETIC) 20-12.5 MG tablet TAKE 1 TABLET EVERY DAY  . meloxicam (MOBIC) 7.5 MG tablet TAKE 1 TABLET(7.5 MG) BY MOUTH DAILY  . metFORMIN (GLUCOPHAGE) 500 MG tablet TAKE 1 TABLET TWICE DAILY  . simvastatin (ZOCOR) 20 MG tablet TAKE 1 TABLET EVERY DAY  . tiZANidine (ZANAFLEX) 4 MG tablet Take 1 tablet (4 mg total) by mouth at bedtime.   Current Facility-Administered Medications for the 04/10/19 encounter (Office Visit) with Juline Patch, MD  Medication  . albuterol (PROVENTIL) (2.5 MG/3ML) 0.083% nebulizer solution 2.5 mg    PHQ 2/9 Scores 04/10/2019 10/26/2018 11/11/2017 10/06/2016  PHQ - 2 Score 0 0 0 0  PHQ- 9 Score 0 0 - -    BP Readings from Last 3 Encounters:  04/10/19 110/60  10/26/18 120/62  04/25/18 130/62    Physical Exam Vitals and nursing note reviewed.  Constitutional:      Appearance: She is well-developed.  HENT:     Head: Normocephalic.     Right Ear: Tympanic membrane, ear canal and external ear normal.     Left Ear: Tympanic membrane, ear canal and external ear normal.     Nose: Nose normal.  Eyes:     General: Lids are everted, no foreign  bodies appreciated. No scleral icterus.       Left eye: No foreign body or hordeolum.     Conjunctiva/sclera: Conjunctivae normal.     Right eye: Right conjunctiva is not injected.     Left eye: Left conjunctiva is not injected.     Pupils: Pupils are equal, round, and reactive to light.  Neck:     Thyroid: No thyromegaly.     Vascular: No JVD.     Trachea: No tracheal deviation.  Cardiovascular:     Rate and Rhythm: Normal rate and regular rhythm.     Heart sounds: Normal heart sounds. No murmur. No friction rub. No gallop.   Pulmonary:     Effort: Pulmonary effort is normal. No respiratory distress.     Breath sounds: Normal breath sounds. No stridor. No wheezing, rhonchi or rales.  Chest:     Chest wall: No tenderness.  Abdominal:     General: Bowel sounds are normal.     Palpations: Abdomen is soft. There is no mass.     Tenderness: There is no abdominal tenderness. There is no guarding or rebound.  Musculoskeletal:        General: No tenderness. Normal range of motion.     Cervical back: Normal range of motion and neck supple.  Lymphadenopathy:     Cervical: No cervical adenopathy.  Skin:    General: Skin is warm.     Findings: No rash.  Neurological:     Mental Status: She is alert and oriented to person, place, and time.     Cranial Nerves: No cranial nerve deficit.     Deep Tendon Reflexes: Reflexes normal.  Psychiatric:        Mood and Affect: Mood is not anxious or depressed.     Wt Readings from Last 3 Encounters:  04/10/19 177 lb (80.3 kg)  10/26/18 173 lb (78.5 kg)  04/25/18 173 lb (78.5 kg)    BP 110/60   Pulse 64   Ht 5\' 6"  (1.676 m)   Wt 177 lb (80.3 kg)   BMI 28.57 kg/m   Assessment and Plan: 1. Essential hypertension Panic. Controlled. Stable. Continue lisinopril hydrochlorothiazide 20-12 0.5 and diltiazem to 40 mg 1 capsule daily. Reviewed labs from endocrine for renal function concerns - lisinopril-hydrochlorothiazide (ZESTORETIC) 20-12.5 MG  tablet; Take 1 tablet by mouth daily.  Dispense: 90 tablet; Refill: 1 - diltiazem (DILT-XR) 240 MG 24 hr capsule; Take 1 capsule (240 mg total) by mouth daily.  Dispense: 90 capsule; Refill: 1  2. Familial multiple lipoprotein-type hyperlipidemia Chronic. Controlled. Stable. Continue simvastatin 20 mg once a day. Will check lipid panel for ratio.- simvastatin (ZOCOR) 20 MG tablet; Take 1 tablet (20 mg total) by mouth daily.  Dispense: 90 tablet; Refill: 1 - Lipid Panel With LDL/HDL Ratio  3. Primary osteoarthritis of knee, unspecified laterality Chronic. Controlled. Patient takes Zanaflex at night to help with sleep as well as taking control of her pain while sleeping. - tiZANidine (ZANAFLEX) 4 MG tablet; Take 1 tablet (4 mg total) by mouth at bedtime.  Dispense: 30 tablet; Refill: 5  4. Elevated transaminase level Patient has had elevated AST ALT's which have come down in the past but we will recheck given the fact that she is continued on statin. - Hepatic Function Panel (6)  5. Overweight (BMI 25.0-29.9) Health risks of being over weight were discussed and patient was counseled on weight loss options and exercise. Mediterranean diet guidelines were given to the patient for control.

## 2019-04-10 NOTE — Patient Instructions (Signed)

## 2019-04-11 LAB — HEPATIC FUNCTION PANEL (6)
ALT: 17 IU/L (ref 0–32)
AST: 26 IU/L (ref 0–40)
Albumin: 4.3 g/dL (ref 3.7–4.7)
Alkaline Phosphatase: 68 IU/L (ref 39–117)
Bilirubin Total: 0.4 mg/dL (ref 0.0–1.2)
Bilirubin, Direct: 0.14 mg/dL (ref 0.00–0.40)

## 2019-04-11 LAB — LIPID PANEL WITH LDL/HDL RATIO
Cholesterol, Total: 122 mg/dL (ref 100–199)
HDL: 46 mg/dL (ref >39)
LDL Chol Calc (NIH): 59 mg/dL (ref 0–99)
LDL/HDL Ratio: 1.3 ratio (ref 0.0–3.2)
Triglycerides: 86 mg/dL (ref 0–149)
VLDL Cholesterol Cal: 17 mg/dL (ref 5–40)

## 2019-04-13 ENCOUNTER — Other Ambulatory Visit: Payer: Self-pay

## 2019-04-13 DIAGNOSIS — I10 Essential (primary) hypertension: Secondary | ICD-10-CM

## 2019-04-13 DIAGNOSIS — E7849 Other hyperlipidemia: Secondary | ICD-10-CM

## 2019-04-13 MED ORDER — DILTIAZEM HCL ER 240 MG PO CP24: 240.0000 mg | ORAL_CAPSULE | Freq: Every day | ORAL | 1 refills | Status: DC

## 2019-04-13 MED ORDER — SIMVASTATIN 20 MG PO TABS: 20.0000 mg | ORAL_TABLET | Freq: Every day | ORAL | 1 refills | Status: DC

## 2019-04-13 MED ORDER — LISINOPRIL-HYDROCHLOROTHIAZIDE 20-12.5 MG PO TABS: 1.0000 | ORAL_TABLET | Freq: Every day | ORAL | 1 refills | Status: DC

## 2019-04-15 ENCOUNTER — Ambulatory Visit: Payer: Medicare HMO | Attending: Internal Medicine

## 2019-04-15 DIAGNOSIS — Z23 Encounter for immunization: Secondary | ICD-10-CM

## 2019-04-15 NOTE — Progress Notes (Signed)
   Covid-19 Vaccination Clinic  Name:  April [name]    MRN: [id number] DOB: [birth year]  04/15/2019  [name] was observed post Covid-19 immunization for 15 minutes without incident. She was provided with Vaccine Information Sheet and instruction to access the V-Safe system.   [name] was instructed to call 911 with any severe reactions post vaccine: Marland Kitchen Difficulty breathing  . Swelling of face and throat  . A fast heartbeat  . A bad rash all over body  . Dizziness and weakness   Immunizations Administered    Name Date Dose VIS Date Route   Pfizer COVID-19 Vaccine 04/15/2019  9:34 AM 0.3 mL 01/19/2019 Intramuscular   Manufacturer: [place]   Lot: [id number]   [place]: [id number]

## 2019-05-07 ENCOUNTER — Ambulatory Visit: Payer: Medicare HMO | Attending: Internal Medicine

## 2019-05-07 DIAGNOSIS — Z23 Encounter for immunization: Secondary | ICD-10-CM

## 2019-05-07 NOTE — Progress Notes (Signed)
   Covid-19 Vaccination Clinic  Name:  April [name]    MRN: [id number] DOB: 05-11-1940  05/07/2019  Ms. April [name] was observed post Covid-19 immunization for 15 minutes without incident. She was provided with Vaccine Information Sheet and instruction to access the V-Safe system.   Ms. April [name] was instructed to call 911 with any severe reactions post vaccine: Marland Kitchen Difficulty breathing  . Swelling of face and throat  . A fast heartbeat  . A bad rash all over body  . Dizziness and weakness   Immunizations Administered    Name Date Dose VIS Date Route   Pfizer COVID-19 Vaccine 05/07/2019  9:10 AM 0.3 mL 01/19/2019 Intramuscular   Manufacturer: [employer], [employer]   Lot: [id number]   [place]: [id number]

## 2019-05-14 IMAGING — CR DG CHEST 2V
2 series · 2 of 2 positions shown · non-contrast
Comparison: 06/22/2009

CLINICAL DATA: Productive cough, fever and weakness over the last 3
days.

EXAM:
CHEST - 2 VIEW

[chest pa]
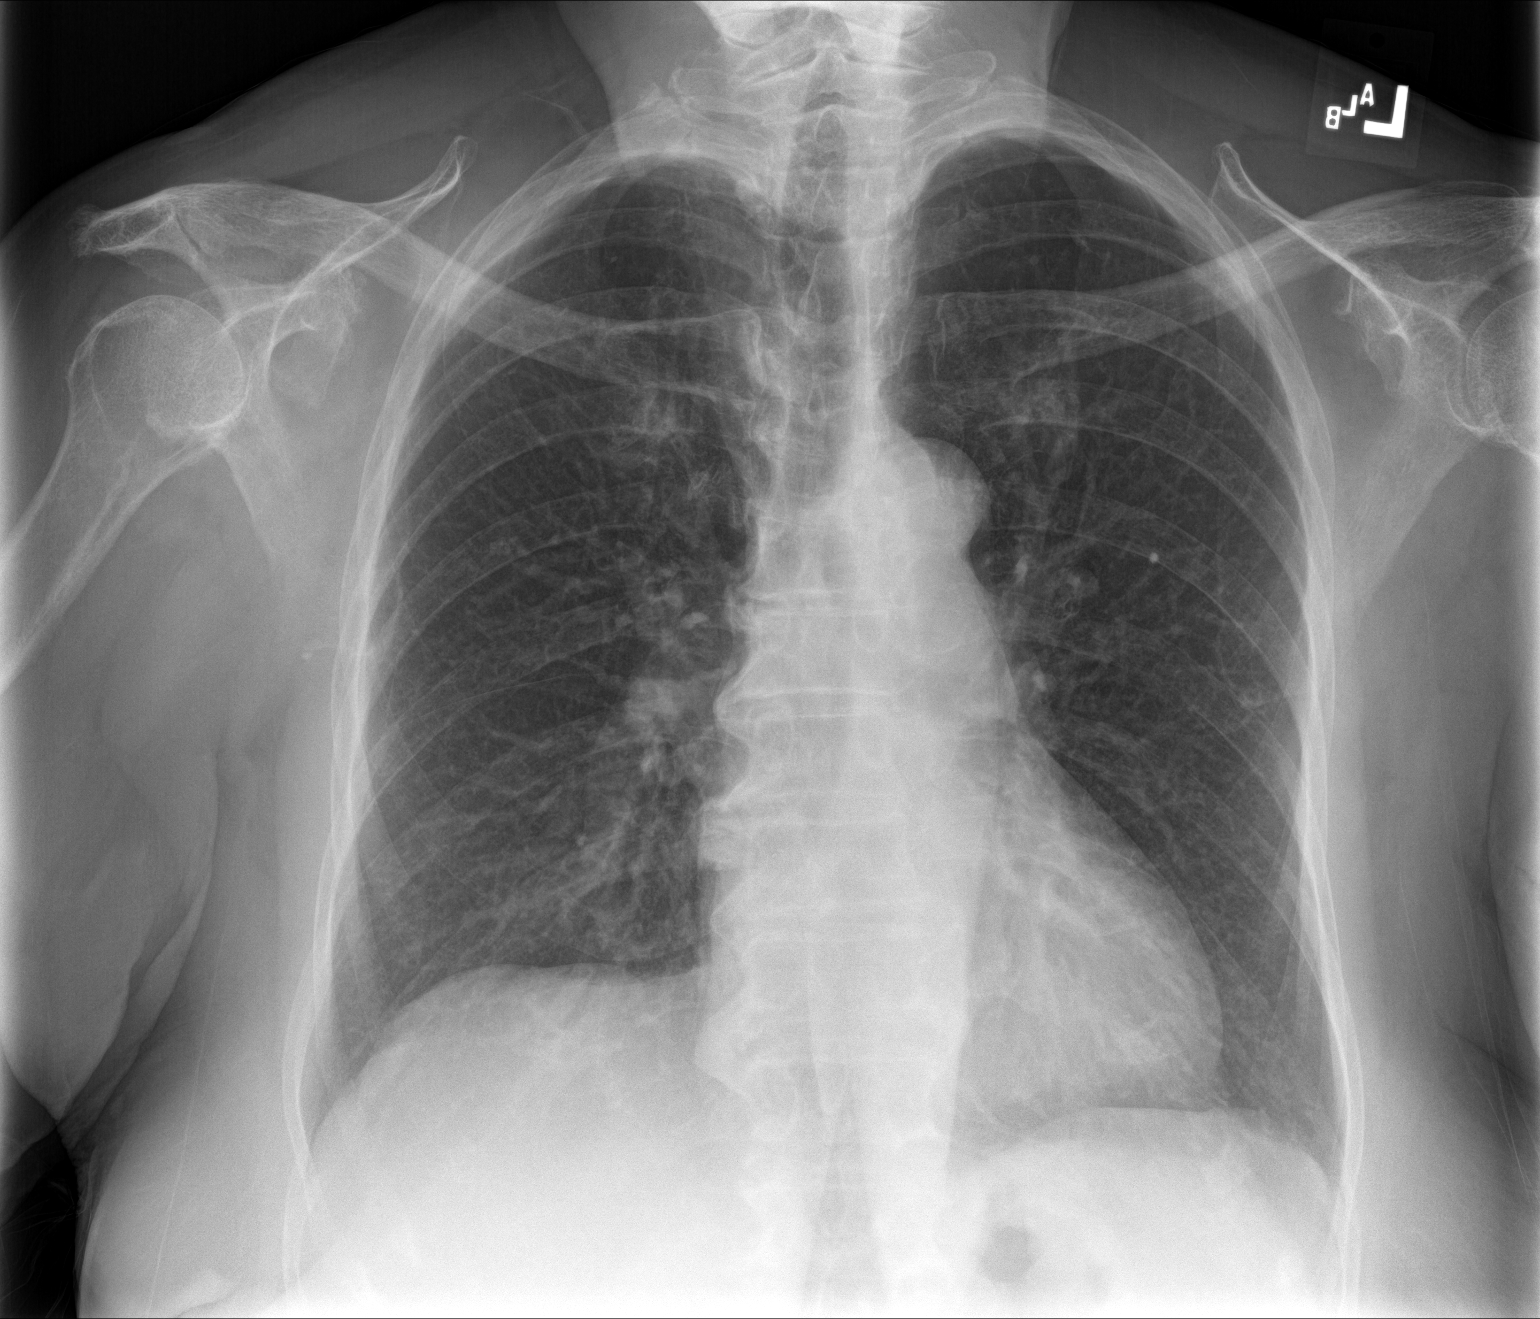

[chest lat]
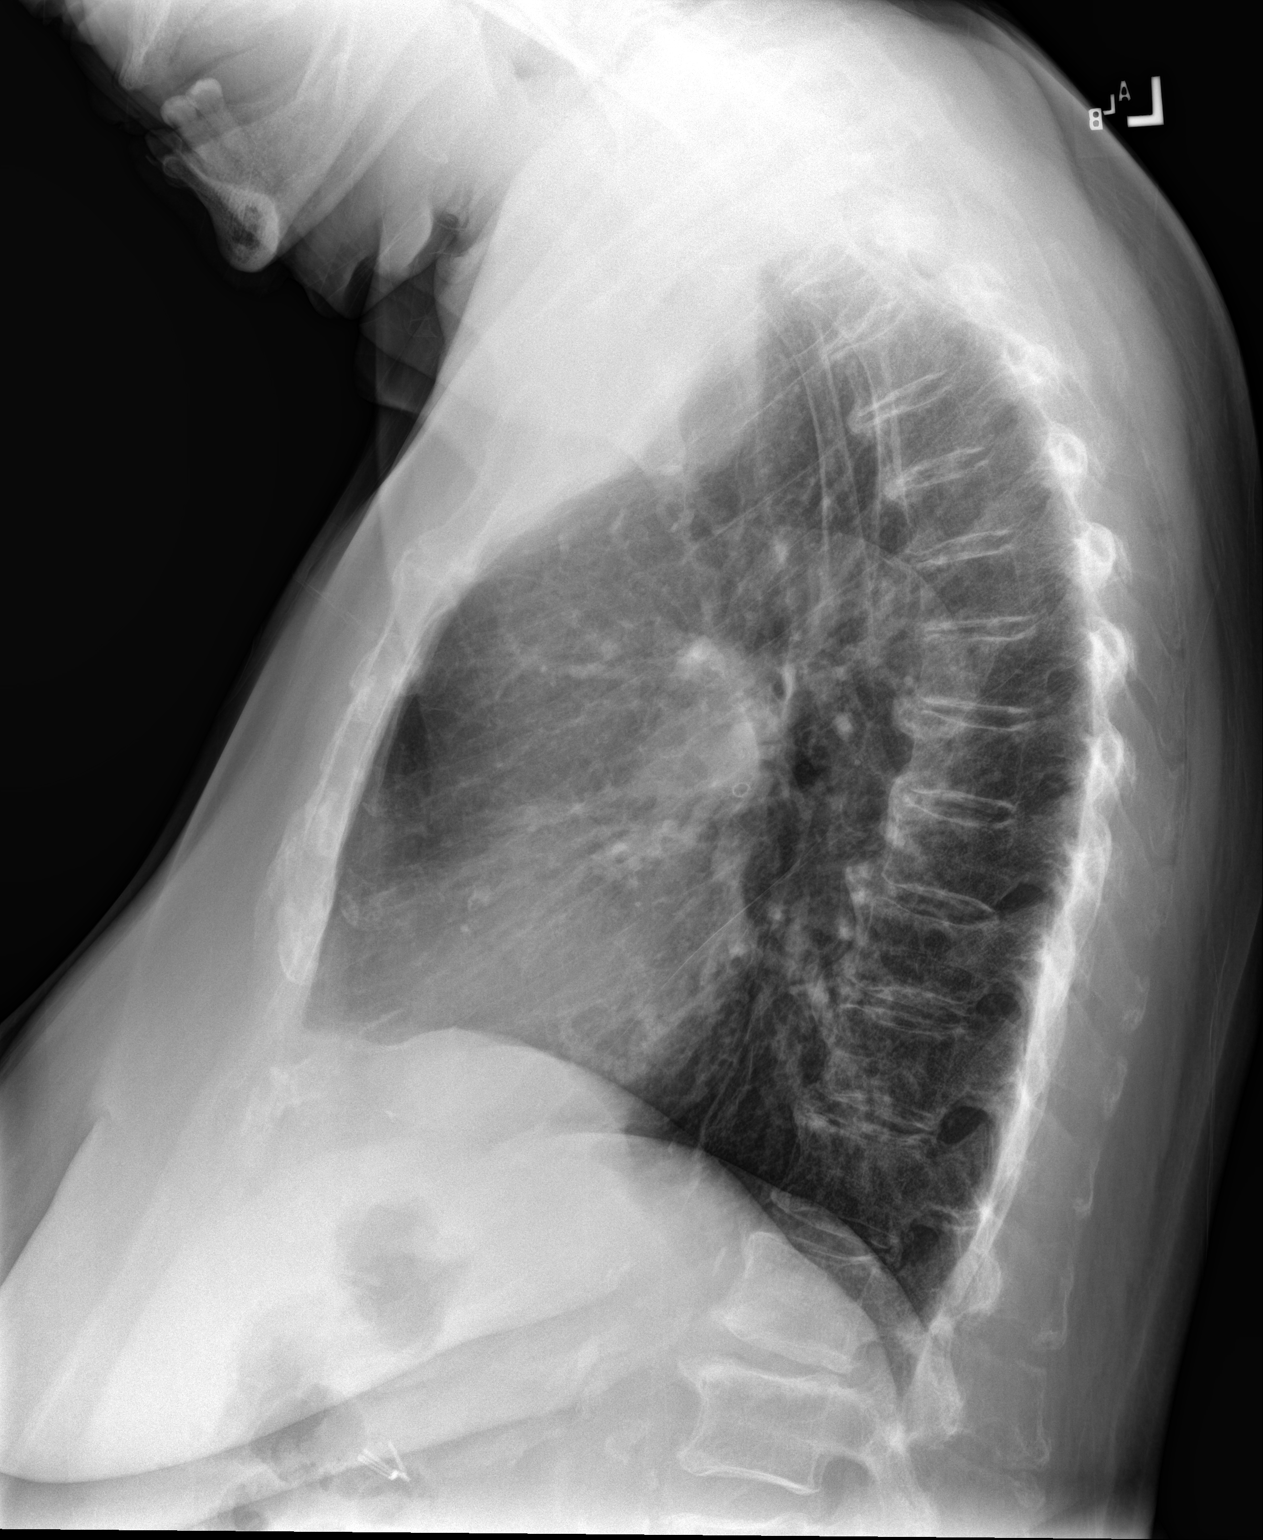

[2 of 2 positions shown; findings below may reference images not displayed]

FINDINGS: Heart size is normal. There is atherosclerosis of the aorta. There
is central bronchial thickening. There may be mild patchy infiltrate
in the left lower lobe but there is no dense consolidation or lobar
collapse. No effusions. Ordinary degenerative changes affect the
spine.
IMPRESSION: Bronchitis pattern. Question mild patchy infiltrate in the left
lower lobe, but without dense consolidation or collapse.

## 2019-06-08 ENCOUNTER — Ambulatory Visit (INDEPENDENT_AMBULATORY_CARE_PROVIDER_SITE_OTHER): Payer: Medicare HMO

## 2019-06-08 ENCOUNTER — Other Ambulatory Visit: Payer: Self-pay

## 2019-06-08 DIAGNOSIS — Z23 Encounter for immunization: Secondary | ICD-10-CM

## 2019-07-04 ENCOUNTER — Ambulatory Visit (INDEPENDENT_AMBULATORY_CARE_PROVIDER_SITE_OTHER): Payer: Medicare HMO

## 2019-07-04 VITALS — Ht 66.0 in | Wt 177.0 lb

## 2019-07-04 DIAGNOSIS — Z0000 Encounter for general adult medical examination without abnormal findings: Secondary | ICD-10-CM

## 2019-07-04 NOTE — Progress Notes (Signed)
Subjective:   April [name] is a 79 y.o. female who presents for an Initial Medicare Annual Wellness Visit.  Virtual Visit via Telephone Note  I connected with  Donnal Moat on 07/04/19 at  9:20 AM EDT by telephone and verified that I am speaking with the correct person using two identifiers.  Medicare Annual Wellness visit completed telephonically due to Covid-19 pandemic.   Location: Patient: home Provider: office   I discussed the limitations, risks, security and privacy concerns of performing an evaluation and management service by telephone and the availability of in person appointments. The patient expressed understanding and agreed to proceed.  Unable to perform video visit due to patient does not have video capability.   Some vital signs may be absent or patient reported.   Clemetine Marker, LPN    Review of Systems      Cardiac Risk Factors include: advanced age (>27men, >62 women);diabetes mellitus;dyslipidemia;hypertension     Objective:    Today's Vitals   07/04/19 0926  Weight: 177 lb (80.3 kg)  Height: 5\' 6"  (1.676 m)   Body mass index is 28.57 kg/m.  Advanced Directives 07/04/2019 02/11/2018 08/29/2017 12/26/2016 06/10/2016 02/18/2015 07/19/2014  Does Patient Have a Medical Advance Directive? No No No No No No Yes  Type of Advance Directive - - - - - - Living will  Does patient want to make changes to medical advance directive? - - - - - - No - Patient declined  Copy of [place] in Chart? - - - - - - No - copy requested  Would patient like information on creating a medical advance directive? Yes (MAU/Ambulatory/Procedural Areas - Information given) - No - Patient declined Yes (ED - Information included in AVS) - Yes - Educational materials given -    Current Medications (verified) Outpatient Encounter Medications as of 07/04/2019  Medication Sig  . aspirin 81 MG tablet Take 1 tablet by mouth daily.  Marland Kitchen diltiazem (DILT-XR) 240 MG 24 hr  capsule Take 1 capsule (240 mg total) by mouth daily.  Marland Kitchen glipiZIDE (GLUCOTROL) 5 MG tablet TAKE 1 TABLET TWICE DAILY  . glucose blood (ACCU-CHEK AVIVA PLUS) test strip USE TWICE DAILY AS DIRECTED  . lisinopril-hydrochlorothiazide (ZESTORETIC) 20-12.5 MG tablet Take 1 tablet by mouth daily.  . metFORMIN (GLUCOPHAGE) 500 MG tablet TAKE 1 TABLET TWICE DAILY  . simvastatin (ZOCOR) 20 MG tablet Take 1 tablet (20 mg total) by mouth daily.  Marland Kitchen tiZANidine (ZANAFLEX) 4 MG tablet Take 1 tablet (4 mg total) by mouth at bedtime.  . colchicine 0.6 MG tablet Take 1 tablet (0.6 mg total) by mouth daily. (Patient not taking: Reported on 07/04/2019)  . [DISCONTINUED] ACCU-CHEK AVIVA PLUS test strip TEST BLOOD SUGAR DAILY AS DIRECTED  . [DISCONTINUED] albuterol (PROVENTIL HFA;VENTOLIN HFA) 108 (90 Base) MCG/ACT inhaler Inhale 2 puffs into the lungs every 6 (six) hours as needed for wheezing or shortness of breath.  . [DISCONTINUED] glucose blood (ACCU-CHEK AVIVA PLUS) test strip USE AS DIRECTED TO TEST BLOOD SUGAR DAILY- E11.9  . [DISCONTINUED] meloxicam (MOBIC) 7.5 MG tablet TAKE 1 TABLET(7.5 MG) BY MOUTH DAILY   Facility-Administered Encounter Medications as of 07/04/2019  Medication  . albuterol (PROVENTIL) (2.5 MG/3ML) 0.083% nebulizer solution 2.5 mg    Allergies (verified) Patient has no known allergies.   History: Past Medical History:  Diagnosis Date  . Diabetes mellitus without complication ([place])   . Gout   . Hyperlipidemia   . Hypertension  Past Surgical History:  Procedure Laterality Date  . APPENDECTOMY    . gallstones  06/12/2009   Family History  Problem Relation Age of Onset  . Breast cancer Neg Hx    Social History   Socioeconomic History  . Marital status: Widowed    Spouse name: Not on file  . Number of children: Not on file  . Years of education: Not on file  . Highest education level: Not on file  Occupational History  . Not on file  Tobacco Use  . Smoking status:  Former Research scientist (life sciences)  . Smokeless tobacco: Never Used  Substance and Sexual Activity  . Alcohol use: No    Alcohol/week: 0.0 standard drinks  . Drug use: No  . Sexual activity: Not Currently  Other Topics Concern  . Not on file  Social History Narrative   Pt lives with her son and his family   Social Determinants of Health   Financial Resource Strain: Low Risk   . Difficulty of Paying Living Expenses: Not hard at all  Food Insecurity: No Food Insecurity  . Worried About Charity fundraiser in the Last Year: Never true  . Ran Out of Food in the Last Year: Never true  Transportation Needs: No Transportation Needs  . Lack of Transportation (Medical): No  . Lack of Transportation (Non-Medical): No  Physical Activity: Insufficiently Active  . Days of Exercise per Week: 3 days  . Minutes of Exercise per Session: 30 min  Stress: No Stress Concern Present  . Feeling of Stress : Not at all  Social Connections: Unknown  . Frequency of Communication with Friends and Family: Patient refused  . Frequency of Social Gatherings with Friends and Family: Patient refused  . Attends Religious Services: Patient refused  . Active Member of Clubs or Organizations: Patient refused  . Attends Archivist Meetings: Patient refused  . Marital Status: Widowed    Tobacco Counseling Counseling given: Not Answered   Clinical Intake:  Pre-visit preparation completed: Yes  Pain : No/denies pain     BMI - recorded: 28.57 Nutritional Status: BMI 25 -29 Overweight Nutritional Risks: None Diabetes: Yes CBG done?: No Did pt. bring in CBG monitor from home?: No   Nutrition Risk Assessment:  Has the patient had any N/V/D within the last 2 months?  No  Does the patient have any non-healing wounds?  No  Has the patient had any unintentional weight loss or weight gain?  No   Diabetes:  Is the patient diabetic?  Yes  If diabetic, was a CBG obtained today?  No  Did the patient bring in their  glucometer from home?  No  How often do you monitor your CBG's? Twice daily.   Financial Strains and Diabetes Management:  Are you having any financial strains with the device, your supplies or your medication? No .  Does the patient want to be seen by Chronic Care Management for management of their diabetes?  No  Would the patient like to be referred to a Nutritionist or for Diabetic Management?  No   Diabetic Exams:  Diabetic Eye Exam: Completed 04/19/18. Overdue for diabetic eye exam. Pt has been advised about the importance in completing this exam.   Diabetic Foot Exam: Completed 04/06/19.   How often do you need to have someone help you when you read instructions, pamphlets, or other written materials from your doctor or pharmacy?: 1 - Never  Interpreter Needed?: No  Information entered by :: Clemetine Marker  LPN   Activities of Daily Living In your present state of health, do you have any difficulty performing the following activities: 07/04/2019  Hearing? N  Comment declines hearing aids  Vision? N  Difficulty concentrating or making decisions? N  Walking or climbing stairs? N  Dressing or bathing? N  Doing errands, shopping? N  Preparing Food and eating ? N  Using the Toilet? N  In the past six months, have you accidently leaked urine? N  Do you have problems with loss of bowel control? N  Managing your Medications? N  Managing your Finances? N  Housekeeping or managing your Housekeeping? N  Some recent data might be hidden     Immunizations and Health Maintenance Immunization History  Administered Date(s) Administered  . Fluad Quad(high Dose 65+) 10/26/2018  . Influenza, High Dose Seasonal PF 10/06/2016  . Influenza,inj,Quad PF,6+ Mos 11/02/2012  . Influenza-Unspecified 12/09/2017  . PFIZER SARS-COV-2 Vaccination 04/15/2019, 05/07/2019  . Pneumococcal Conjugate-13 10/06/2016  . Pneumococcal Polysaccharide-23 06/08/2019   Health Maintenance Due  Topic Date Due    . OPHTHALMOLOGY EXAM  04/19/2019    Patient Care Team: Juline Patch, MD as PCP - General (Family Medicine)  Indicate any recent Medical Services you may have received from other than [facility] providers in the past year (date may be approximate).     Assessment:   This is a routine wellness examination for Xianna.  Hearing/Vision screen  Hearing Screening   125Hz  250Hz  500Hz  1000Hz  2000Hz  3000Hz  4000Hz  6000Hz  8000Hz   Right ear:           Left ear:           Comments: Pt denies hearing difficulty  Vision Screening Comments: Annual vision screenings with Dr. Wyatt Portela at [place] issues and exercise activities discussed: Current Exercise Habits: Home exercise routine, Type of exercise: treadmill, Time (Minutes): 30, Frequency (Times/Week): 3, Weekly Exercise (Minutes/Week): 90, Intensity: Mild, Exercise limited by: None identified  Goals   None    Depression Screen PHQ 2/9 Scores 07/04/2019 04/10/2019 10/26/2018 11/11/2017 10/06/2016 05/19/2015  PHQ - 2 Score 0 0 0 0 0 0  PHQ- 9 Score - 0 0 - - -    Fall Risk Fall Risk  07/04/2019 04/10/2019 11/11/2017 10/06/2016 05/19/2015  Falls in the past year? 0 0 No No No  Number falls in past yr: 0 - - - -  Injury with Fall? 0 - - - -  Risk for fall due to : No Fall Risks - - - -  Follow up Falls prevention discussed Falls evaluation completed - - -    FALL RISK PREVENTION PERTAINING TO THE HOME:  Any stairs in or around the home? Yes  If so, are there any without handrails? Yes   Home free of loose throw rugs in walkways, pet beds, electrical cords, etc? Yes  Adequate lighting in your home to reduce risk of falls? Yes   ASSISTIVE DEVICES UTILIZED TO PREVENT FALLS:  Life alert? No  Use of a cane, walker or w/c? No  Grab bars in the bathroom? No  Shower chair or bench in shower? No  Elevated toilet seat or a handicapped toilet? No   DME ORDERS:  DME order needed?  No   TIMED UP AND GO:  Was the test performed? No .  telephonic visit.  Education: Fall risk prevention has been discussed.  Intervention(s) required? No   DME/home health order needed?  No   Cognitive Function:  6CIT Screen 07/04/2019  What Year? 0 points  What month? 0 points  What time? 0 points  Count back from 20 0 points  Months in reverse 0 points  Repeat phrase 0 points  Total Score 0    Screening Tests Health Maintenance  Topic Date Due  . OPHTHALMOLOGY EXAM  04/19/2019  . TETANUS/TDAP  10/26/2019 (Originally 03/13/1959)  . INFLUENZA VACCINE  09/09/2019  . HEMOGLOBIN A1C  10/04/2019  . FOOT EXAM  04/05/2020  . DEXA SCAN  Completed  . COVID-19 Vaccine  Completed  . PNA vac Low Risk Adult  Completed    Qualifies for Shingles Vaccine? Yes . Due for Shingrix. Education has been provided regarding the importance of this vaccine. Pt has been advised to call insurance company to determine out of pocket expense. Advised may also receive vaccine at local pharmacy or Health Dept. Verbalized acceptance and understanding.  Tdap: Although this vaccine is not a covered service during a Wellness Exam, does the patient still wish to receive this vaccine today?  No .  Education has been provided regarding the importance of this vaccine. Advised may receive this vaccine at local pharmacy or Health Dept. Aware to provide a copy of the vaccination record if obtained from local pharmacy or Health Dept. Verbalized acceptance and understanding.  Flu Vaccine: Up to date  Pneumococcal Vaccine: Up to date  Covid-19 Vaccine: Up to date   Cancer Screenings:  Colorectal Screening: No longer required.   Mammogram: Completed 12/25/18. Repeat every year;   Bone Density: Completed 09/27/17. Results reflect OSTEOPENIA. Repeat every 2 years.   Lung Cancer Screening: (Low Dose CT Chest recommended if Age 53-80 years, 30 pack-year currently smoking OR have quit w/in 15years.) does not qualify.    Additional Screening:  Hepatitis C  Screening: no longer required  Vision Screening: Recommended annual ophthalmology exams for early detection of glaucoma and other disorders of the eye. Is the patient up to date with their annual eye exam?  Yes  Who is the provider or what is the name of the office in which the pt attends annual eye exams? Dr. Wyatt Portela  Dental Screening: Recommended annual dental exams for proper oral hygiene  Community Resource Referral:  CRR required this visit?  No      Plan:    I have personally reviewed and addressed the Medicare Annual Wellness questionnaire and have noted the following in the patient's chart:  A. Medical and social history B. Use of alcohol, tobacco or illicit drugs  C. Current medications and supplements D. Functional ability and status E.  Nutritional status F.  Physical activity G. Advance directives H. List of other physicians I.  Hospitalizations, surgeries, and ER visits in previous 12 months J.  [place] such as hearing and vision if needed, cognitive and depression L. Referrals and appointments   In addition, I have reviewed and discussed with patient certain preventive protocols, quality metrics, and best practice recommendations. A written personalized care plan for preventive services as well as general preventive health recommendations were provided to patient.   Signed,  Clemetine Marker, LPN Nurse Health Advisor    Nurse Notes: none

## 2019-07-04 NOTE — Patient Instructions (Signed)
April [name] , Thank you for taking time to come for your Medicare Wellness Visit. I appreciate your ongoing commitment to your health goals. Please review the following plan we discussed and let me know if I can assist you in the future.   Screening recommendations/referrals: Colonoscopy: no longer required Mammogram: done 12/25/18 Bone Density: done 09/27/17 Recommended yearly ophthalmology/optometry visit for glaucoma screening and checkup Recommended yearly dental visit for hygiene and checkup  Vaccinations: Influenza vaccine: done 10/26/18 Pneumococcal vaccine: done 06/08/19 Tdap vaccine: due Shingles vaccine: Shingrix discussed. Please contact your pharmacy for coverage information.  Covid-19:done 04/15/19 7 05/07/19  Advanced directives: Advance directive discussed with you today. I have provided a copy for you to complete at home and have notarized. Once this is complete please bring a copy in to our office so we can scan it into your chart.  Conditions/risks identified: Recommend healthy eating and physical activity to lower A1c  Next appointment: Please follow up in one year for your Medicare Annual Wellness visit.     Preventive Care 8 Years and Older, Female Preventive care refers to lifestyle choices and visits with your health care provider that can promote health and wellness. What does preventive care include?  A yearly physical exam. This is also called an annual well check.  Dental exams once or twice a year.  Routine eye exams. Ask your health care provider how often you should have your eyes checked.  Personal lifestyle choices, including:  Daily care of your teeth and gums.  Regular physical activity.  Eating a healthy diet.  Avoiding tobacco and drug use.  Limiting alcohol use.  Practicing safe sex.  Taking low-dose aspirin every day.  Taking vitamin and mineral supplements as recommended by your health care provider. What happens during an annual  well check? The services and screenings done by your health care provider during your annual well check will depend on your age, overall health, lifestyle risk factors, and family history of disease. Counseling  Your health care provider may ask you questions about your:  Alcohol use.  Tobacco use.  Drug use.  Emotional well-being.  Home and relationship well-being.  Sexual activity.  Eating habits.  History of falls.  Memory and ability to understand (cognition).  Work and work Statistician.  Reproductive health. Screening  You may have the following tests or measurements:  Height, weight, and BMI.  Blood pressure.  Lipid and cholesterol levels. These may be checked every 5 years, or more frequently if you are over 73 years old.  Skin check.  Lung cancer screening. You may have this screening every year starting at age 67 if you have a 30-pack-year history of smoking and currently smoke or have quit within the past 15 years.  Fecal occult blood test (FOBT) of the stool. You may have this test every year starting at age 39.  Flexible sigmoidoscopy or colonoscopy. You may have a sigmoidoscopy every 5 years or a colonoscopy every 10 years starting at age 50.  Hepatitis C blood test.  Hepatitis B blood test.  Sexually transmitted disease (STD) testing.  Diabetes screening. This is done by checking your blood sugar (glucose) after you have not eaten for a while (fasting). You may have this done every 1-3 years.  Bone density scan. This is done to screen for osteoporosis. You may have this done starting at age 40.  Mammogram. This may be done every 1-2 years. Talk to your health care provider about how often you should have  regular mammograms. Talk with your health care provider about your test results, treatment options, and if necessary, the need for more tests. Vaccines  Your health care provider may recommend certain vaccines, such as:  Influenza vaccine. This  is recommended every year.  Tetanus, diphtheria, and acellular pertussis (Tdap, Td) vaccine. You may need a Td booster every 10 years.  Zoster vaccine. You may need this after age 77.  Pneumococcal 13-valent conjugate (PCV13) vaccine. One dose is recommended after age 36.  Pneumococcal polysaccharide (PPSV23) vaccine. One dose is recommended after age 23. Talk to your health care provider about which screenings and vaccines you need and how often you need them. This information is not intended to replace advice given to you by your health care provider. Make sure you discuss any questions you have with your health care provider. Document Released: 02/21/2015 Document Revised: 10/15/2015 Document Reviewed: 11/26/2014 Elsevier Interactive Patient Education  2017 [place] Prevention in the Home Falls can cause injuries. They can happen to people of all ages. There are many things you can do to make your home safe and to help prevent falls. What can I do on the outside of my home?  Regularly fix the edges of walkways and driveways and fix any cracks.  Remove anything that might make you trip as you walk through a door, such as a raised step or threshold.  Trim any bushes or trees on the path to your home.  Use bright outdoor lighting.  Clear any walking paths of anything that might make someone trip, such as rocks or tools.  Regularly check to see if handrails are loose or broken. Make sure that both sides of any steps have handrails.  Any raised decks and porches should have guardrails on the edges.  Have any leaves, snow, or ice cleared regularly.  Use sand or salt on walking paths during winter.  Clean up any spills in your garage right away. This includes oil or grease spills. What can I do in the bathroom?  Use night lights.  Install grab bars by the toilet and in the tub and shower. Do not use towel bars as grab bars.  Use non-skid mats or decals in the tub or  shower.  If you need to sit down in the shower, use a plastic, non-slip stool.  Keep the floor dry. Clean up any water that spills on the floor as soon as it happens.  Remove soap buildup in the tub or shower regularly.  Attach bath mats securely with double-sided non-slip rug tape.  Do not have throw rugs and other things on the floor that can make you trip. What can I do in the bedroom?  Use night lights.  Make sure that you have a light by your bed that is easy to reach.  Do not use any sheets or blankets that are too big for your bed. They should not hang down onto the floor.  Have a firm chair that has side arms. You can use this for support while you get dressed.  Do not have throw rugs and other things on the floor that can make you trip. What can I do in the kitchen?  Clean up any spills right away.  Avoid walking on wet floors.  Keep items that you use a lot in easy-to-reach places.  If you need to reach something above you, use a strong step stool that has a grab bar.  Keep electrical cords out of the  way.  Do not use floor polish or wax that makes floors slippery. If you must use wax, use non-skid floor wax.  Do not have throw rugs and other things on the floor that can make you trip. What can I do with my stairs?  Do not leave any items on the stairs.  Make sure that there are handrails on both sides of the stairs and use them. Fix handrails that are broken or loose. Make sure that handrails are as long as the stairways.  Check any carpeting to make sure that it is firmly attached to the stairs. Fix any carpet that is loose or worn.  Avoid having throw rugs at the top or bottom of the stairs. If you do have throw rugs, attach them to the floor with carpet tape.  Make sure that you have a light switch at the top of the stairs and the bottom of the stairs. If you do not have them, ask someone to add them for you. What else can I do to help prevent  falls?  Wear shoes that:  Do not have high heels.  Have rubber bottoms.  Are comfortable and fit you well.  Are closed at the toe. Do not wear sandals.  If you use a stepladder:  Make sure that it is fully opened. Do not climb a closed stepladder.  Make sure that both sides of the stepladder are locked into place.  Ask someone to hold it for you, if possible.  Clearly mark and make sure that you can see:  Any grab bars or handrails.  First and last steps.  Where the edge of each step is.  Use tools that help you move around (mobility aids) if they are needed. These include:  Canes.  Walkers.  Scooters.  Crutches.  Turn on the lights when you go into a dark area. Replace any light bulbs as soon as they burn out.  Set up your furniture so you have a clear path. Avoid moving your furniture around.  If any of your floors are uneven, fix them.  If there are any pets around you, be aware of where they are.  Review your medicines with your doctor. Some medicines can make you feel dizzy. This can increase your chance of falling. Ask your doctor what other things that you can do to help prevent falls. This information is not intended to replace advice given to you by your health care provider. Make sure you discuss any questions you have with your health care provider. Document Released: 11/21/2008 Document Revised: 07/03/2015 Document Reviewed: 03/01/2014 Elsevier Interactive Patient Education  2017 [employer].

## 2019-07-06 ENCOUNTER — Telehealth: Payer: Self-pay | Admitting: Family Medicine

## 2019-07-06 DIAGNOSIS — E785 Hyperlipidemia, unspecified: Secondary | ICD-10-CM | POA: Diagnosis not present

## 2019-07-06 DIAGNOSIS — E1169 Type 2 diabetes mellitus with other specified complication: Secondary | ICD-10-CM | POA: Diagnosis not present

## 2019-07-06 DIAGNOSIS — E1159 Type 2 diabetes mellitus with other circulatory complications: Secondary | ICD-10-CM | POA: Diagnosis not present

## 2019-07-06 DIAGNOSIS — I152 Hypertension secondary to endocrine disorders: Secondary | ICD-10-CM | POA: Diagnosis not present

## 2019-07-06 NOTE — Telephone Encounter (Signed)
Called pt didn't answer

## 2019-07-06 NOTE — Telephone Encounter (Signed)
Copied from [place] [id number]. Topic: General - Inquiry >> Jul 06, 2019 10:44 AM Alease Frame wrote: Reason for CRM: Pt called in wanting to speak with Baxter Flattery , Dr Adah Salvage nurse . Pt wouldn't states reasoning.please advise

## 2019-07-16 ENCOUNTER — Ambulatory Visit (INDEPENDENT_AMBULATORY_CARE_PROVIDER_SITE_OTHER): Payer: Medicare HMO | Admitting: Family Medicine

## 2019-07-16 ENCOUNTER — Other Ambulatory Visit: Payer: Self-pay

## 2019-07-16 ENCOUNTER — Encounter: Payer: Self-pay | Admitting: Family Medicine

## 2019-07-16 VITALS — BP 120/60 | HR 68 | Ht 66.0 in | Wt 171.0 lb

## 2019-07-16 DIAGNOSIS — M7711 Lateral epicondylitis, right elbow: Secondary | ICD-10-CM

## 2019-07-16 MED ORDER — MELOXICAM 7.5 MG PO TABS: 7.5000 mg | ORAL_TABLET | Freq: Every day | ORAL | 0 refills | Status: DC

## 2019-07-16 NOTE — Progress Notes (Signed)
Date:  07/16/2019   Name:  April [name]   DOB:  08/08/1940   MRN:  [id number]   Chief Complaint: Arm Pain (hurts from elbow down to wrist- can't pick up anything with that arm)  Arm Pain  The incident occurred 3 to 5 days ago (onset Friday). 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 There was no injury mechanism. The pain is present in the right elbow (lateral epicondyle). The quality of the pain is described as aching. The pain radiates to the right arm. The pain is moderate. The pain has been constant since the incident. Pertinent negatives include no numbness or tingling. Exacerbated by: grasping object. She has tried NSAIDs for the symptoms.    Lab Results  Component Value Date   CREATININE 1.00 10/26/2018   BUN 10 10/26/2018   NA 140 10/26/2018   K 3.8 10/26/2018   CL 100 10/26/2018   CO2 27 10/26/2018   Lab Results  Component Value Date   CHOL 122 04/10/2019   HDL 46 04/10/2019   LDLCALC 59 04/10/2019   TRIG 86 04/10/2019   CHOLHDL 3.2 10/06/2016   No results found for: TSH Lab Results  Component Value Date   HGBA1C 9.3 04/06/2019   Lab Results  Component Value Date   WBC 7.4 08/29/2017   HGB 10.7 (L) 08/29/2017   HCT 32.6 (L) 08/29/2017   MCV 88.6 08/29/2017   PLT 284 08/29/2017   Lab Results  Component Value Date   ALT 17 04/10/2019   AST 26 04/10/2019   ALKPHOS 68 04/10/2019   BILITOT 0.4 04/10/2019     Review of Systems  Neurological: Negative for tingling and numbness.    Patient Active Problem List   Diagnosis Date Noted  . Elevated transaminase level 04/10/2019  . Primary osteoarthritis of knee 10/06/2016  . Type 2 diabetes mellitus without complications ([place]) 10/31/3005  . 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 Essential hypertension 06/20/2014  . Routine sports examination 06/20/2014  . Health examination of defined subpopulation 06/20/2014  . Hyperlipidemia due to type  2 diabetes mellitus ([place]) 06/20/2014    No Known Allergies  Past Surgical History:  Procedure Laterality Date  . APPENDECTOMY    . gallstones  06/12/2009    Social History   Tobacco Use  . Smoking status: Former Research scientist (life sciences)  . Smokeless tobacco: Never Used  Substance Use Topics  . Alcohol use: No    Alcohol/week: 0.0 standard drinks  . Drug use: No     Medication list has been reviewed and updated.  Current Meds  Medication Sig  . aspirin 81 MG tablet Take 1 tablet by mouth daily.  Marland Kitchen diltiazem (DILT-XR) 240 MG 24 hr capsule Take 1 capsule (240 mg total) by mouth daily.  Marland Kitchen glipiZIDE (GLUCOTROL) 5 MG tablet TAKE 1 TABLET TWICE DAILY  . glucose blood (ACCU-CHEK AVIVA PLUS) test strip USE TWICE DAILY AS DIRECTED  . lisinopril-hydrochlorothiazide (ZESTORETIC) 20-12.5 MG tablet Take 1 tablet by mouth daily.  . metFORMIN (GLUCOPHAGE) 500 MG tablet TAKE 1 TABLET TWICE DAILY  . simvastatin (ZOCOR) 20 MG tablet Take 1 tablet (20 mg total) by mouth daily.  Marland Kitchen tiZANidine (ZANAFLEX) 4 MG tablet Take 1 tablet (4 mg total) by mouth at bedtime.   Current Facility-Administered Medications for the 07/16/19 encounter (Office Visit) with Juline Patch, MD  Medication  . albuterol (PROVENTIL) (2.5 MG/3ML) 0.083% nebulizer solution 2.5 mg    PHQ 2/9  Scores 07/16/2019 07/04/2019 04/10/2019 10/26/2018  PHQ - 2 Score 0 0 0 0  PHQ- 9 Score 0 - 0 0    BP Readings from Last 3 Encounters:  07/16/19 120/60  04/10/19 110/60  10/26/18 120/62    Physical Exam  Wt Readings from Last 3 Encounters:  07/16/19 171 lb (77.6 kg)  07/04/19 177 lb (80.3 kg)  04/10/19 177 lb (80.3 kg)    BP 120/60   Pulse 68   Ht 5\' 6"  (1.676 m)   Wt 171 lb (77.6 kg)   BMI 27.60 kg/m   Assessment and Plan: 1. Epicondylitis, lateral, right New onset.  Persistent.  Uncontrolled.  Patient has difficulty grasping objects with her right hand as well as wrist extension.  This is consistent with a lateral epicondylitis.   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  In any event we will treat with meloxicam 7.5 mg daily and encouraged wearing a wrist splint for at least 10 days patient is to be rechecked if necessary in 2 weeks.

## 2019-07-16 NOTE — Patient Instructions (Signed)
Tennis Elbow    Tennis elbow (lateral epicondylitis) is inflammation of tendons in your outer forearm, near your elbow. Tendons are tissues that connect muscle to bone. When you have tennis elbow, inflammation affects the tendons that you use to bend your wrist and move your hand up. Inflammation occurs in the lower part of the upper arm bone (humerus), where the tendons connect to the bone (lateral epicondyle).  Tennis elbow often affects people who play tennis, but anyone may get the condition from repeatedly extending the wrist or turning the forearm.  What are the causes?  This condition is usually caused by repeatedly extending the wrist, turning the forearm, and using the hands. It can result from sports or work that requires repetitive forearm movements. In some cases, it may be caused by a sudden injury.  What increases the risk?  You are more likely to develop tennis elbow if you play tennis or another racket sport. You also have a higher risk if you frequently use your hands for work. Besides people who play tennis, others at greater risk include:  Musicians.  Carpenters, painters, and plumbers.  Cooks.  Cashiers.  People who work in factories.  Construction workers.  Butchers.  People who use computers.  What are the signs or symptoms?  Symptoms of this condition include:  Pain and tenderness in the forearm and the outer part of the elbow. Pain may be felt only when using the arm, or it may be there all the time.  A burning feeling that starts in the elbow and spreads down the forearm.  A weak grip in the hand.  How is this diagnosed?  This condition may be diagnosed based on:  Your symptoms and medical history.  A physical exam.  X-rays.  MRI.  How is this treated?  Resting and icing your arm is often the first treatment. Your health care provider may also recommend:  Medicines to reduce pain and inflammation. These may be in the form of a pill, topical gels, or shots of a steroid medicine  (cortisone).  An elbow strap to reduce stress on the area.  Physical therapy. This may include massage or exercises.  An elbow brace to restrict the movements that cause symptoms.  If these treatments do not help relieve your symptoms, your health care provider may recommend surgery to remove damaged muscle and reattach healthy muscle to bone.  Follow these instructions at home:  Activity  Rest your elbow and wrist and avoid activities that cause symptoms, as told by your health care provider.  Do physical therapy exercises as instructed.  If you lift an object, lift it with your palm facing up. This reduces stress on your elbow.  Lifestyle  If your tennis elbow is caused by sports, check your equipment and make sure that:  You are using it correctly.  It is the best fit for you.  If your tennis elbow is caused by work or computer use, take frequent breaks to stretch your arm. Talk with your manager about ways to manage your condition at work.  If you have a brace:  Wear the brace or strap as told by your health care provider. Remove it only as told by your health care provider.  Loosen the brace if your fingers tingle, become numb, or turn cold and blue.  Keep the brace clean.  If the brace is not waterproof, ask if you may remove it for bathing. If you must keep the brace on while bathing:    bath or a shower. General instructions   If directed, put ice on the painful area: ? Put ice in a plastic bag. ? Place a towel between your skin and the bag. ? Leave the ice on for 20 minutes, 2-3 times a day.  Take over-the-counter and prescription medicines only as told by your health care provider.  Keep all follow-up visits as told by your health care provider. This is important. Contact a health care provider if:  You have pain that gets worse or does not get better with  treatment.  You have numbness or weakness in your forearm, hand, or fingers. Summary  Tennis elbow (lateral epicondylitis) is inflammation of tendons in your outer forearm, near your elbow.  Common symptoms include pain and tenderness in your forearm and the outer part of your elbow.  This condition is usually caused by repeatedly extending your wrist, turning your forearm, and using your hands.  The first treatment is often resting and icing your arm to relieve symptoms. Further treatment may include taking medicine, getting physical therapy, wearing a brace or strap, or having surgery. This information is not intended to replace advice given to you by your health care provider. Make sure you discuss any questions you have with your health care provider. Document Revised: 10/21/2017 Document Reviewed: 11/09/2016 Elsevier Patient Education  2020 Elsevier Inc.  

## 2019-07-18 DIAGNOSIS — E119 Type 2 diabetes mellitus without complications: Secondary | ICD-10-CM | POA: Diagnosis not present

## 2019-07-26 ENCOUNTER — Other Ambulatory Visit: Payer: Self-pay

## 2019-08-24 ENCOUNTER — Other Ambulatory Visit: Payer: Self-pay | Admitting: Family Medicine

## 2019-08-24 DIAGNOSIS — E7849 Other hyperlipidemia: Secondary | ICD-10-CM

## 2019-09-24 ENCOUNTER — Encounter: Payer: Self-pay | Admitting: Family Medicine

## 2019-09-24 ENCOUNTER — Other Ambulatory Visit: Payer: Self-pay

## 2019-09-24 ENCOUNTER — Ambulatory Visit (INDEPENDENT_AMBULATORY_CARE_PROVIDER_SITE_OTHER): Payer: Medicare HMO | Admitting: Family Medicine

## 2019-09-24 VITALS — BP 138/64 | HR 78 | Ht 66.0 in | Wt 169.0 lb

## 2019-09-24 DIAGNOSIS — M1710 Unilateral primary osteoarthritis, unspecified knee: Secondary | ICD-10-CM | POA: Diagnosis not present

## 2019-09-24 DIAGNOSIS — R7401 Elevation of levels of liver transaminase levels: Secondary | ICD-10-CM

## 2019-09-24 DIAGNOSIS — I10 Essential (primary) hypertension: Secondary | ICD-10-CM | POA: Diagnosis not present

## 2019-09-24 DIAGNOSIS — E119 Type 2 diabetes mellitus without complications: Secondary | ICD-10-CM | POA: Diagnosis not present

## 2019-09-24 DIAGNOSIS — E7849 Other hyperlipidemia: Secondary | ICD-10-CM | POA: Diagnosis not present

## 2019-09-24 MED ORDER — MELOXICAM 7.5 MG PO TABS: 7.5000 mg | ORAL_TABLET | Freq: Every day | ORAL | 1 refills | Status: DC

## 2019-09-24 MED ORDER — LISINOPRIL-HYDROCHLOROTHIAZIDE 20-12.5 MG PO TABS: 1.0000 | ORAL_TABLET | Freq: Every day | ORAL | 1 refills | Status: DC

## 2019-09-24 MED ORDER — METFORMIN HCL 500 MG PO TABS: ORAL_TABLET | ORAL | 1 refills | Status: DC

## 2019-09-24 MED ORDER — GLIPIZIDE 5 MG PO TABS: 5.0000 mg | ORAL_TABLET | Freq: Two times a day (BID) | ORAL | 1 refills | Status: DC

## 2019-09-24 MED ORDER — SIMVASTATIN 20 MG PO TABS: 20.0000 mg | ORAL_TABLET | Freq: Every day | ORAL | 1 refills | Status: DC

## 2019-09-24 MED ORDER — TIZANIDINE HCL 4 MG PO TABS: 4.0000 mg | ORAL_TABLET | Freq: Every day | ORAL | 5 refills | Status: DC

## 2019-09-24 MED ORDER — DILTIAZEM HCL ER 240 MG PO CP24: 240.0000 mg | ORAL_CAPSULE | Freq: Every day | ORAL | 1 refills | Status: DC

## 2019-09-24 NOTE — Progress Notes (Signed)
Date:  09/24/2019   Name:  April [name]   DOB:  1940-09-15   MRN:  [id number]   Chief Complaint: Hyperlipidemia (Medication refills. ), Osteoarthritis, Diabetes, and Hypertension  Hyperlipidemia This is a chronic problem. The current episode started more than 1 year ago. The problem is controlled. Recent lipid tests were reviewed and are normal. She has no history of chronic renal disease, diabetes, hypothyroidism, liver disease, obesity or nephrotic syndrome. There are no known factors aggravating her hyperlipidemia. Pertinent negatives include no chest pain, focal sensory loss, focal weakness, leg pain, myalgias or shortness of breath. Current antihyperlipidemic treatment includes statins. The current treatment provides moderate improvement of lipids. There are no compliance problems.   Diabetes She presents for her follow-up diabetic visit. She has type 2 diabetes mellitus. Pertinent negatives for hypoglycemia include no dizziness, headaches, nervousness/anxiousness or sweats. Pertinent negatives for diabetes include no blurred vision, no chest pain, no fatigue, no foot paresthesias, no foot ulcerations, no polydipsia, no polyphagia, no polyuria, no visual change, no weakness and no weight loss. There are no hypoglycemic complications. Symptoms are stable. There are no diabetic complications. Risk factors for coronary artery disease include diabetes mellitus, dyslipidemia and hypertension. Current diabetic treatment includes oral agent (dual therapy). She is following a generally healthy diet. Meal planning includes avoidance of concentrated sweets and carbohydrate counting. Her breakfast blood glucose is taken between 8-9 am. Her breakfast blood glucose range is generally 90-110 mg/dl. An ACE inhibitor/angiotensin II receptor blocker is being taken. Eye exam is current.  Hypertension This is a chronic problem. The current episode started more than 1 year ago. The problem has been gradually  improving since onset. Associated symptoms include anxiety. Pertinent negatives include no blurred vision, chest pain, headaches, malaise/fatigue, neck pain, orthopnea, palpitations, peripheral edema, PND, shortness of breath or sweats. There are no associated agents to hypertension. Risk factors for coronary artery disease include dyslipidemia. There is no history of chronic renal disease.    Lab Results  Component Value Date   CREATININE 1.00 10/26/2018   BUN 10 10/26/2018   NA 140 10/26/2018   K 3.8 10/26/2018   CL 100 10/26/2018   CO2 27 10/26/2018   Lab Results  Component Value Date   CHOL 122 04/10/2019   HDL 46 04/10/2019   LDLCALC 59 04/10/2019   TRIG 86 04/10/2019   CHOLHDL 3.2 10/06/2016   No results found for: TSH Lab Results  Component Value Date   HGBA1C 9.3 04/06/2019   Lab Results  Component Value Date   WBC 7.4 08/29/2017   HGB 10.7 (L) 08/29/2017   HCT 32.6 (L) 08/29/2017   MCV 88.6 08/29/2017   PLT 284 08/29/2017   Lab Results  Component Value Date   ALT 17 04/10/2019   AST 26 04/10/2019   ALKPHOS 68 04/10/2019   BILITOT 0.4 04/10/2019     Review of Systems  Constitutional: Negative.  Negative for chills, fatigue, fever, malaise/fatigue, unexpected weight change and weight loss.  HENT: Negative for congestion, ear discharge, ear pain, rhinorrhea, sinus pressure, sneezing and sore throat.   Eyes: Negative for blurred vision, photophobia, pain, discharge, redness and itching.  Respiratory: Negative for cough, shortness of breath, wheezing and stridor.   Cardiovascular: Negative for chest pain, palpitations, orthopnea and PND.  Gastrointestinal: Negative for abdominal pain, blood in stool, constipation, diarrhea, nausea and vomiting.  Endocrine: Negative for cold intolerance, heat intolerance, polydipsia, polyphagia and polyuria.  Genitourinary: Negative for dysuria, flank pain, frequency, hematuria,  menstrual problem, pelvic pain, urgency, vaginal  bleeding and vaginal discharge.  Musculoskeletal: Negative for arthralgias, back pain, myalgias and neck pain.  Skin: Negative for rash.  Allergic/Immunologic: Negative for environmental allergies and food allergies.  Neurological: Negative for dizziness, focal weakness, weakness, light-headedness, numbness and headaches.  Hematological: Negative for adenopathy. Does not bruise/bleed easily.  Psychiatric/Behavioral: Negative for dysphoric mood. The patient is not nervous/anxious.     Patient Active Problem List   Diagnosis Date Noted  . Elevated transaminase level 04/10/2019  . Primary osteoarthritis of knee 10/06/2016  . Type 2 diabetes mellitus without complications ([place]) 93/71/6967  . 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 Essential hypertension 06/20/2014  . Routine sports examination 06/20/2014  . Health examination of defined subpopulation 06/20/2014  . Hyperlipidemia due to type 2 diabetes mellitus ([place]) 06/20/2014    No Known Allergies  Past Surgical History:  Procedure Laterality Date  . APPENDECTOMY    . gallstones  06/12/2009    Social History   Tobacco Use  . Smoking status: Former Research scientist (life sciences)  . Smokeless tobacco: Never Used  Vaping Use  . Vaping Use: Never used  Substance Use Topics  . Alcohol use: No    Alcohol/week: 0.0 standard drinks  . Drug use: No     Medication list has been reviewed and updated.  Current Meds  Medication Sig  . aspirin 81 MG tablet Take 1 tablet by mouth daily.  Marland Kitchen diltiazem (DILT-XR) 240 MG 24 hr capsule Take 1 capsule (240 mg total) by mouth daily.  Marland Kitchen glipiZIDE (GLUCOTROL) 5 MG tablet TAKE 1 TABLET TWICE DAILY  . glucose blood (ACCU-CHEK AVIVA PLUS) test strip USE TWICE DAILY AS DIRECTED  . lisinopril-hydrochlorothiazide (ZESTORETIC) 20-12.5 MG tablet Take 1 tablet by mouth daily.  . meloxicam (MOBIC) 7.5 MG tablet Take 1 tablet (7.5 mg total) by mouth daily.  . metFORMIN  (GLUCOPHAGE) 500 MG tablet TAKE 1 TABLET TWICE DAILY  . simvastatin (ZOCOR) 20 MG tablet Take 1 tablet (20 mg total) by mouth daily.  Marland Kitchen tiZANidine (ZANAFLEX) 4 MG tablet Take 1 tablet (4 mg total) by mouth at bedtime.   Current Facility-Administered Medications for the 09/24/19 encounter (Office Visit) with Juline Patch, MD  Medication  . albuterol (PROVENTIL) (2.5 MG/3ML) 0.083% nebulizer solution 2.5 mg    PHQ 2/9 Scores 09/24/2019 07/16/2019 07/04/2019 04/10/2019  PHQ - 2 Score 0 0 0 0  PHQ- 9 Score 0 0 - 0    GAD 7 : Generalized Anxiety Score 09/24/2019 07/16/2019 04/10/2019  Nervous, Anxious, on Edge 0 0 0  Control/stop worrying 0 0 0  Worry too much - different things 0 0 0  Trouble relaxing 0 0 0  Restless 0 0 0  Easily annoyed or irritable 0 0 0  Afraid - awful might happen 0 0 0  Total GAD 7 Score 0 0 0  Anxiety Difficulty Not difficult at all - -    BP Readings from Last 3 Encounters:  09/24/19 138/64  07/16/19 120/60  04/10/19 110/60    Physical Exam  Wt Readings from Last 3 Encounters:  09/24/19 169 lb (76.7 kg)  07/16/19 171 lb (77.6 kg)  07/04/19 177 lb (80.3 kg)    BP 138/64   Pulse 78   Ht 5\' 6"  (1.676 m)   Wt 169 lb (76.7 kg)   SpO2 99%   BMI 27.28 kg/m   Assessment and Plan: 1. Type 2 diabetes mellitus without complication, without long-term  current use of insulin (HCC) Chronic.  Controlled.  Stable.  Continue Metformin 500 mg twice a day and glipizide 5 mg 1 twice a day.  Will check A1c and CMP. - metFORMIN (GLUCOPHAGE) 500 MG tablet; TAKE 1 TABLET TWICE DAILY  Dispense: 180 tablet; Refill: 1 - glipiZIDE (GLUCOTROL) 5 MG tablet; Take 1 tablet (5 mg total) by mouth 2 (two) times daily.  Dispense: 180 tablet; Refill: 1 - Hemoglobin A1c - Comprehensive metabolic panel  2. Essential hypertension Chronic.  Controlled.  Stable.  Continue diltiazem 24 mg once a day and lisinopril hydrochlorothiazide 20-12.5 mg once a day.  Will check CMP. -  lisinopril-hydrochlorothiazide (ZESTORETIC) 20-12.5 MG tablet; Take 1 tablet by mouth daily.  Dispense: 90 tablet; Refill: 1 - diltiazem (DILT-XR) 240 MG 24 hr capsule; Take 1 capsule (240 mg total) by mouth daily.  Dispense: 90 capsule; Refill: 1 - Comprehensive metabolic panel  3. Familial multiple lipoprotein-type hyperlipidemia Chronic.  Controlled.  Stable.  Continue simvastatin 20 mg once a day.  Will check lipid panel for current level of LDL. - simvastatin (ZOCOR) 20 MG tablet; Take 1 tablet (20 mg total) by mouth daily.  Dispense: 90 tablet; Refill: 1 - Lipid panel  4. Primary osteoarthritis of knee, unspecified laterality Chronic.  Controlled.  Stable.  Refill tizanidine as needed at bedtime for leg pain. - tiZANidine (ZANAFLEX) 4 MG tablet; Take 1 tablet (4 mg total) by mouth at bedtime.  Dispense: 30 tablet; Refill: 5  5. Elevated transaminase level Chronic.  Episodic.  Noted in the past.  We will check comprehensive metabolic panel with transaminases to note current level. - Comprehensive metabolic panel

## 2019-09-25 ENCOUNTER — Other Ambulatory Visit: Payer: Self-pay

## 2019-09-25 LAB — LIPID PANEL
Chol/HDL Ratio: 3.1 ratio (ref 0.0–4.4)
Cholesterol, Total: 142 mg/dL (ref 100–199)
HDL: 46 mg/dL (ref >39)
LDL Chol Calc (NIH): 73 mg/dL (ref 0–99)
Triglycerides: 133 mg/dL (ref 0–149)
VLDL Cholesterol Cal: 23 mg/dL (ref 5–40)

## 2019-09-25 LAB — COMPREHENSIVE METABOLIC PANEL
ALT: 18 IU/L (ref 0–32)
AST: 31 IU/L (ref 0–40)
Albumin/Globulin Ratio: 1.7 (ref 1.2–2.2)
Albumin: 4.5 g/dL (ref 3.7–4.7)
Alkaline Phosphatase: 67 IU/L (ref 48–121)
BUN/Creatinine Ratio: 20 (ref 12–28)
BUN: 21 mg/dL (ref 8–27)
Bilirubin Total: 0.4 mg/dL (ref 0.0–1.2)
CO2: 25 mmol/L (ref 20–29)
Calcium: 9.6 mg/dL (ref 8.7–10.3)
Chloride: 104 mmol/L (ref 96–106)
Creatinine, Ser: 1.04 mg/dL — ABNORMAL HIGH (ref 0.57–1.00)
GFR calc Af Amer: 59 mL/min/{1.73_m2} — ABNORMAL LOW (ref >59)
GFR calc non Af Amer: 51 mL/min/{1.73_m2} — ABNORMAL LOW (ref >59)
Globulin, Total: 2.6 g/dL (ref 1.5–4.5)
Glucose: 126 mg/dL — ABNORMAL HIGH (ref 65–99)
Potassium: 4.1 mmol/L (ref 3.5–5.2)
Sodium: 140 mmol/L (ref 134–144)
Total Protein: 7.1 g/dL (ref 6.0–8.5)

## 2019-09-25 LAB — HEMOGLOBIN A1C
Est. average glucose Bld gHb Est-mCnc: 200 mg/dL
Hgb A1c MFr Bld: 8.6 % — ABNORMAL HIGH (ref 4.8–5.6)

## 2019-09-25 MED ORDER — GLIPIZIDE 10 MG PO TABS
10.0000 mg | ORAL_TABLET | Freq: Two times a day (BID) | ORAL | 0 refills | Status: DC
Start: 2019-09-25 — End: 2020-05-14

## 2019-10-18 ENCOUNTER — Telehealth: Payer: Self-pay

## 2019-10-18 NOTE — Chronic Care Management (AMB) (Signed)
  Chronic Care Management   Outreach Note  10/18/2019 Name: JERIANNE ANSELMO MRN: [id number] DOB: 1940/09/14  Mardi Mainland Durocher is a 79 y.o. year old female who is a primary care patient of Juline Patch, MD. I reached out to Donnal Moat by phone today in response to a referral sent by Ms. Camy L Juarez's health plan.     An unsuccessful telephone outreach was attempted today. The patient was referred to the case management team for assistance with care management and care coordination.   Follow Up Plan: A HIPPA compliant phone message was left for the patient providing contact information and requesting a return call.  The care management team will reach out to the patient again over the next 7 days.  If patient returns call to provider office, please advise to call [facility]  at [place], [place], [place], [place] [address] Direct Dial: [phone] [email or web address]: [email or web address]

## 2019-10-19 DIAGNOSIS — E785 Hyperlipidemia, unspecified: Secondary | ICD-10-CM | POA: Diagnosis not present

## 2019-10-19 DIAGNOSIS — E1129 Type 2 diabetes mellitus with other diabetic kidney complication: Secondary | ICD-10-CM | POA: Diagnosis not present

## 2019-10-19 DIAGNOSIS — I152 Hypertension secondary to endocrine disorders: Secondary | ICD-10-CM | POA: Diagnosis not present

## 2019-10-19 DIAGNOSIS — E1159 Type 2 diabetes mellitus with other circulatory complications: Secondary | ICD-10-CM | POA: Diagnosis not present

## 2019-10-19 DIAGNOSIS — E1169 Type 2 diabetes mellitus with other specified complication: Secondary | ICD-10-CM | POA: Diagnosis not present

## 2019-10-19 DIAGNOSIS — R809 Proteinuria, unspecified: Secondary | ICD-10-CM | POA: Diagnosis not present

## 2019-10-26 NOTE — Chronic Care Management (AMB) (Signed)
  Chronic Care Management   Outreach Note  10/26/2019 Name: April [name] MRN: [id number] DOB: 05/04/1940  April [name] is a 79 y.o. year old female who is a primary care patient of Juline Patch, MD. I reached out to April [name] by phone today in response to a referral sent by April [name]'s health plan.     A second unsuccessful telephone outreach was attempted today. The patient was referred to the case management team for assistance with care management and care coordination.   Follow Up Plan: A HIPAA compliant phone message was left for the patient providing contact information and requesting a return call.  The care management team will reach out to the patient again over the next 7 days.  If patient returns call to provider office, please advise to call [facility] at [place], [place], [place], [place] [address] Direct Dial: [phone] [name] Website: [email or web address]

## 2019-11-05 NOTE — Chronic Care Management (AMB) (Signed)
  Chronic Care Management   Note  11/05/2019 Name: April [name] MRN: [id number] DOB: [birth year]  [name] is a 79 y.o. year old female who is a primary care patient of Juline Patch, MD. I reached out to Donnal Moat by phone today in response to a referral sent by [name]'s health plan.     [name] was given information about Chronic Care Management services today including:  1. CCM service includes personalized support from designated clinical staff supervised by her physician, including individualized plan of care and coordination with other care providers 2. 24/7 contact phone numbers for assistance for urgent and routine care needs. 3. Service will only be billed when office clinical staff spend 20 minutes or more in a month to coordinate care. 4. Only one practitioner may furnish and bill the service in a calendar month. 5. The patient may stop CCM services at any time (effective at the end of the month) by phone call to the office staff. 6. The patient will be responsible for cost sharing (co-pay) of up to 20% of the service fee (after annual deductible is met).  Patient did not agree to enrollment in care management services and does not wish to consider at this time.  Follow up plan: The patient has been provided with contact information for the care management team and has been advised to call with any health related questions or concerns.   Noreene Larsson, [place], [place], [place] [address] Direct Dial: [phone] [email or web address] Website: [email or web address]

## 2019-11-06 ENCOUNTER — Telehealth: Payer: Self-pay | Admitting: Family Medicine

## 2019-11-06 NOTE — Telephone Encounter (Signed)
I called pt- no answer- left vm for her to call back

## 2019-11-06 NOTE — Telephone Encounter (Unsigned)
Copied from [facility] [phone]. Topic: General - Other >> Nov 06, 2019 11:40 AM Keene Breath wrote: Reason for CRM: Patient would like Baxter Flattery to call her regarding her lab appt.  CB# [phone]

## 2019-11-09 ENCOUNTER — Other Ambulatory Visit: Payer: Self-pay

## 2019-11-16 ENCOUNTER — Other Ambulatory Visit: Payer: Self-pay

## 2019-11-20 ENCOUNTER — Other Ambulatory Visit: Payer: Self-pay

## 2019-11-20 ENCOUNTER — Telehealth: Payer: Self-pay

## 2019-11-20 DIAGNOSIS — Z1231 Encounter for screening mammogram for malignant neoplasm of breast: Secondary | ICD-10-CM

## 2019-11-20 NOTE — Telephone Encounter (Signed)
Called with mammo appt for 12/26/19 @ 10:00 in [facility]

## 2019-11-27 ENCOUNTER — Other Ambulatory Visit: Payer: Self-pay

## 2019-11-27 ENCOUNTER — Ambulatory Visit (INDEPENDENT_AMBULATORY_CARE_PROVIDER_SITE_OTHER): Payer: Medicare HMO

## 2019-11-27 DIAGNOSIS — Z23 Encounter for immunization: Secondary | ICD-10-CM | POA: Diagnosis not present

## 2019-12-26 ENCOUNTER — Other Ambulatory Visit: Payer: Self-pay

## 2019-12-26 ENCOUNTER — Ambulatory Visit
Admission: RE | Admit: 2019-12-26 | Discharge: 2019-12-26 | Disposition: A | Discharge status: Home / Self Care | Payer: Medicare HMO | Source: Ambulatory Visit | Attending: Family Medicine | Admitting: Family Medicine

## 2019-12-26 DIAGNOSIS — Z1231 Encounter for screening mammogram for malignant neoplasm of breast: Secondary | ICD-10-CM | POA: Insufficient documentation

## 2019-12-31 ENCOUNTER — Other Ambulatory Visit: Payer: Self-pay | Admitting: Family Medicine

## 2019-12-31 DIAGNOSIS — N6310 Unspecified lump in the right breast, unspecified quadrant: Secondary | ICD-10-CM

## 2019-12-31 DIAGNOSIS — R928 Other abnormal and inconclusive findings on diagnostic imaging of breast: Secondary | ICD-10-CM

## 2020-01-08 ENCOUNTER — Encounter: Payer: Self-pay | Admitting: Family Medicine

## 2020-01-08 NOTE — Telephone Encounter (Signed)
This encounter was created in error - please disregard.

## 2020-01-09 ENCOUNTER — Telehealth: Payer: Self-pay

## 2020-01-09 NOTE — Telephone Encounter (Unsigned)
Copied from [facility] [phone]. Topic: General - Other >> Jan 09, 2020  9:50 AM Celene Kras wrote: Reason for CRM: Pt called and is requesting to have a print out copy of her covid vaccines. Please advise.

## 2020-01-09 NOTE — Telephone Encounter (Signed)
Left pt a message

## 2020-01-14 ENCOUNTER — Other Ambulatory Visit: Payer: Self-pay

## 2020-01-14 ENCOUNTER — Ambulatory Visit
Admission: RE | Admit: 2020-01-14 | Discharge: 2020-01-14 | Disposition: A | Discharge status: Home / Self Care | Payer: Medicare HMO | Source: Ambulatory Visit | Attending: Family Medicine | Admitting: Family Medicine

## 2020-01-14 ENCOUNTER — Telehealth: Payer: Self-pay

## 2020-01-14 DIAGNOSIS — R928 Other abnormal and inconclusive findings on diagnostic imaging of breast: Secondary | ICD-10-CM | POA: Insufficient documentation

## 2020-01-14 DIAGNOSIS — N6310 Unspecified lump in the right breast, unspecified quadrant: Secondary | ICD-10-CM | POA: Diagnosis not present

## 2020-01-14 DIAGNOSIS — R922 Inconclusive mammogram: Secondary | ICD-10-CM | POA: Diagnosis not present

## 2020-01-14 DIAGNOSIS — N6489 Other specified disorders of breast: Secondary | ICD-10-CM | POA: Diagnosis not present

## 2020-01-14 NOTE — Telephone Encounter (Unsigned)
Copied from [facility] [phone]. Topic: General - Other >> Jan 14, 2020 11:14 AM Celene Kras wrote: Reason for CRM: PT called and is requesting to speak with Baxter Flattery. She states that she went for her second mammogram today and everything was fine. Please advise.

## 2020-01-14 NOTE — Telephone Encounter (Signed)
Will await results

## 2020-01-25 DIAGNOSIS — I152 Hypertension secondary to endocrine disorders: Secondary | ICD-10-CM | POA: Diagnosis not present

## 2020-01-25 DIAGNOSIS — E1129 Type 2 diabetes mellitus with other diabetic kidney complication: Secondary | ICD-10-CM | POA: Diagnosis not present

## 2020-01-25 DIAGNOSIS — E1159 Type 2 diabetes mellitus with other circulatory complications: Secondary | ICD-10-CM | POA: Diagnosis not present

## 2020-01-25 DIAGNOSIS — E785 Hyperlipidemia, unspecified: Secondary | ICD-10-CM | POA: Diagnosis not present

## 2020-01-25 DIAGNOSIS — R809 Proteinuria, unspecified: Secondary | ICD-10-CM | POA: Diagnosis not present

## 2020-01-25 DIAGNOSIS — E1169 Type 2 diabetes mellitus with other specified complication: Secondary | ICD-10-CM | POA: Diagnosis not present

## 2020-01-25 LAB — HEMOGLOBIN A1C: Hemoglobin A1C: 7.4

## 2020-01-30 ENCOUNTER — Other Ambulatory Visit: Payer: Self-pay | Admitting: Family Medicine

## 2020-01-30 DIAGNOSIS — M1710 Unilateral primary osteoarthritis, unspecified knee: Secondary | ICD-10-CM

## 2020-01-30 DIAGNOSIS — E7849 Other hyperlipidemia: Secondary | ICD-10-CM

## 2020-01-30 NOTE — Telephone Encounter (Signed)
Requested medication (s) are due for refill today - unsure  Requested medication (s) are on the active medication list -yes  Future visit scheduled -yes  Last refill: 12/05/19  Notes to clinic: Request medication that fails lab protocol- Meloxicam and non delegated Rx-Tizanidine - sent for review  Requested Prescriptions  Pending Prescriptions Disp Refills   meloxicam (MOBIC) 7.5 MG tablet [Pharmacy Med Name: MELOXICAM 7.5 MG Tablet] 90 tablet 1    Sig: TAKE 1 TABLET EVERY DAY      Analgesics:  COX2 Inhibitors Failed - 01/30/2020  3:59 PM      Failed - HGB in normal range and within 360 days    Hemoglobin  Date Value Ref Range Status  08/29/2017 10.7 (L) 12.0 - 16.0 g/dL Final          Failed - Cr in normal range and within 360 days    Creatinine, Ser  Date Value Ref Range Status  09/24/2019 1.04 (H) 0.57 - 1.00 mg/dL Final          Passed - Patient is not pregnant      Passed - Valid encounter within last 12 months    Recent Outpatient Visits           4 months ago Type 2 diabetes mellitus without complication, without long-term current use of insulin ([place])   [facility] Juline Patch, MD   6 months ago Epicondylitis, lateral, right   [facility] Juline Patch, MD   9 months ago Essential hypertension   [facility] Juline Patch, MD   1 year ago Essential hypertension   [facility] Juline Patch, MD   1 year ago Encounter for examination required by Department of Transportation (DOT)   [facility] Juline Patch, MD                  tiZANidine (ZANAFLEX) 4 MG tablet [Pharmacy Med Name: TIZANIDINE HYDROCHLORIDE 4 MG Tablet] 90 tablet     Sig: TAKE 1 TABLET AT BEDTIME      Not Delegated - Cardiovascular:  Alpha-2 Agonists - tizanidine Failed - 01/30/2020  3:59 PM      Failed - This refill cannot be delegated      Passed - Valid encounter within last 6 months    Recent Outpatient Visits            4 months ago Type 2 diabetes mellitus without complication, without long-term current use of insulin ([place])   [facility] Juline Patch, MD   6 months ago Epicondylitis, lateral, right   [facility] Juline Patch, MD   9 months ago Essential hypertension   [place], Deanna C, MD   1 year ago Essential hypertension   [facility] Juline Patch, MD   1 year ago Encounter for examination required by Department of Transportation (DOT)   [facility] Juline Patch, MD                 Refused Prescriptions Disp Refills   simvastatin (ZOCOR) 20 MG tablet [Pharmacy Med Name: SIMVASTATIN 20 MG Tablet] 90 tablet 1    Sig: TAKE 1 TABLET EVERY DAY      Cardiovascular:  Antilipid - Statins Failed - 01/30/2020  3:59 PM      Failed - LDL in normal range and within 360 days    LDL Chol Calc (NIH)  Date Value  Ref Range Status  09/24/2019 73 0 - 99 mg/dL Final          Passed - Total Cholesterol in normal range and within 360 days    Cholesterol, Total  Date Value Ref Range Status  09/24/2019 142 100 - 199 mg/dL Final          Passed - HDL in normal range and within 360 days    HDL  Date Value Ref Range Status  09/24/2019 46 >39 mg/dL Final          Passed - Triglycerides in normal range and within 360 days    Triglycerides  Date Value Ref Range Status  09/24/2019 133 0 - 149 mg/dL Final          Passed - Patient is not pregnant      Passed - Valid encounter within last 12 months    Recent Outpatient Visits           4 months ago Type 2 diabetes mellitus without complication, without long-term current use of insulin ([place])   [facility] Juline Patch, MD   6 months ago Epicondylitis, lateral, right   [facility] Juline Patch, MD   9 months ago Essential hypertension   [facility] Juline Patch, MD   1 year ago Essential hypertension   [facility] Juline Patch, MD   1 year ago Encounter for examination required by Department of Transportation (DOT)   [facility] Juline Patch, MD                    Requested Prescriptions  Pending Prescriptions Disp Refills   meloxicam (MOBIC) 7.5 MG tablet [Pharmacy Med Name: MELOXICAM 7.5 MG Tablet] 90 tablet 1    Sig: TAKE 1 TABLET EVERY DAY      Analgesics:  COX2 Inhibitors Failed - 01/30/2020  3:59 PM      Failed - HGB in normal range and within 360 days    Hemoglobin  Date Value Ref Range Status  08/29/2017 10.7 (L) 12.0 - 16.0 g/dL Final          Failed - Cr in normal range and within 360 days    Creatinine, Ser  Date Value Ref Range Status  09/24/2019 1.04 (H) 0.57 - 1.00 mg/dL Final          Passed - Patient is not pregnant      Passed - Valid encounter within last 12 months    Recent Outpatient Visits           4 months ago Type 2 diabetes mellitus without complication, without long-term current use of insulin ([place])   [facility] Juline Patch, MD   6 months ago Epicondylitis, lateral, right   [facility] Juline Patch, MD   9 months ago Essential hypertension   [place], Deanna C, MD   1 year ago Essential hypertension   [facility] Juline Patch, MD   1 year ago Encounter for examination required by Department of Transportation (DOT)   [facility] Jul[facility], [facility]                  tiZANidine (ZANAFLEX) 4 MG tablet [Pharmacy Med Name: TIZANIDINE HYDROCHLORIDE 4 MG Tablet] 90 tablet     Sig: TAKE 1 TABLET AT BEDTIME      Not Delegated - Cardiovascular:  Alpha-2 Agonists -  tizanidine Failed - 01/30/2020  3:59 PM      Failed - This refill cannot be delegated      Passed - Valid encounter within last 6 months    Recent Outpatient Visits           4 months ago Type 2 diabetes mellitus without complication, without long-term current use of insulin ([place])   [facility] Juline Patch, MD   6 months ago Epicondylitis, lateral, right   [facility] Juline Patch, MD   9 months ago Essential hypertension   [facility] Juline Patch, MD   1 year ago Essential hypertension   [facility] Juline Patch, MD   1 year ago Encounter for examination required by Department of Transportation (DOT)   [facility] Juline Patch, MD                 Refused Prescriptions Disp Refills   simvastatin (ZOCOR) 20 MG tablet [Pharmacy Med Name: SIMVASTATIN 20 MG Tablet] 90 tablet 1    Sig: TAKE 1 TABLET EVERY DAY      Cardiovascular:  Antilipid - Statins Failed - 01/30/2020  3:59 PM      Failed - LDL in normal range and within 360 days    LDL Chol Calc (NIH)  Date Value Ref Range Status  09/24/2019 73 0 - 99 mg/dL Final          Passed - Total Cholesterol in normal range and within 360 days    Cholesterol, Total  Date Value Ref Range Status  09/24/2019 142 100 - 199 mg/dL Final          Passed - HDL in normal range and within 360 days    HDL  Date Value Ref Range Status  09/24/2019 46 >39 mg/dL Final          Passed - Triglycerides in normal range and within 360 days    Triglycerides  Date Value Ref Range Status  09/24/2019 133 0 - 149 mg/dL Final          Passed - Patient is not pregnant      Passed - Valid encounter within last 12 months    Recent Outpatient Visits           4 months ago Type 2 diabetes mellitus without complication, without long-term current use of insulin ([place])   [facility] Juline Patch, MD   6 months ago Epicondylitis, lateral, right   [facility] Juline Patch, MD   9 months ago Essential hypertension   [facility] Juline Patch, MD   1 year ago Essential hypertension   [facility] Juline Patch, MD   1 year ago Encounter for examination required by Department of Transportation (DOT)   [facility] Juline Patch, MD

## 2020-02-25 ENCOUNTER — Other Ambulatory Visit: Payer: Self-pay | Admitting: Family Medicine

## 2020-02-25 DIAGNOSIS — I10 Essential (primary) hypertension: Secondary | ICD-10-CM

## 2020-05-05 ENCOUNTER — Other Ambulatory Visit: Payer: Self-pay | Admitting: Family Medicine

## 2020-05-05 DIAGNOSIS — I10 Essential (primary) hypertension: Secondary | ICD-10-CM

## 2020-05-05 NOTE — Telephone Encounter (Signed)
Courtesy refill. Future appt 05/14/20

## 2020-05-14 ENCOUNTER — Encounter: Payer: Self-pay | Admitting: Family Medicine

## 2020-05-14 ENCOUNTER — Ambulatory Visit (INDEPENDENT_AMBULATORY_CARE_PROVIDER_SITE_OTHER): Payer: Medicare HMO | Admitting: Family Medicine

## 2020-05-14 ENCOUNTER — Other Ambulatory Visit: Payer: Self-pay

## 2020-05-14 VITALS — BP 138/70 | HR 60 | Ht 66.0 in | Wt 164.0 lb

## 2020-05-14 DIAGNOSIS — I10 Essential (primary) hypertension: Secondary | ICD-10-CM | POA: Diagnosis not present

## 2020-05-14 DIAGNOSIS — E7849 Other hyperlipidemia: Secondary | ICD-10-CM

## 2020-05-14 MED ORDER — LISINOPRIL-HYDROCHLOROTHIAZIDE 20-12.5 MG PO TABS: 1.0000 | ORAL_TABLET | Freq: Every day | ORAL | 1 refills | Status: DC

## 2020-05-14 MED ORDER — SIMVASTATIN 20 MG PO TABS: 20.0000 mg | ORAL_TABLET | Freq: Every day | ORAL | 1 refills | Status: DC

## 2020-05-14 MED ORDER — DILTIAZEM HCL ER 240 MG PO CP24: ORAL_CAPSULE | ORAL | 1 refills | Status: DC

## 2020-05-14 NOTE — Progress Notes (Signed)
Date:  05/14/2020   Name:  April [name]   DOB:  1940/11/23   MRN:  [id number]   Chief Complaint: Hypertension and Hyperlipidemia  Hypertension This is a chronic problem. The current episode started more than 1 year ago. The problem has been gradually improving since onset. The problem is controlled. Pertinent negatives include no anxiety, blurred vision, chest pain, headaches, malaise/fatigue, neck pain, orthopnea, palpitations, peripheral edema, PND, shortness of breath or sweats. There are no associated agents to hypertension. Risk factors for coronary artery disease include dyslipidemia. Past treatments include ACE inhibitors, calcium channel blockers and diuretics. The current treatment provides moderate improvement. There are no compliance problems.  There is no history of angina, kidney disease, CAD/MI, CVA, heart failure, left ventricular hypertrophy, PVD or retinopathy. There is no history of chronic renal disease, a hypertension causing med or renovascular disease.  Hyperlipidemia This is a chronic problem. The current episode started more than 1 year ago. The problem is controlled. Recent lipid tests were reviewed and are normal. She has no history of chronic renal disease. Pertinent negatives include no chest pain, myalgias or shortness of breath. Current antihyperlipidemic treatment includes statins. The current treatment provides moderate improvement of lipids. There are no compliance problems.     Lab Results  Component Value Date   CREATININE 1.04 (H) 09/24/2019   BUN 21 09/24/2019   NA 140 09/24/2019   K 4.1 09/24/2019   CL 104 09/24/2019   CO2 25 09/24/2019   Lab Results  Component Value Date   CHOL 142 09/24/2019   HDL 46 09/24/2019   LDLCALC 73 09/24/2019   TRIG 133 09/24/2019   CHOLHDL 3.1 09/24/2019   No results found for: TSH Lab Results  Component Value Date   HGBA1C 7.4 01/25/2020   Lab Results  Component Value Date   WBC 7.4 08/29/2017   HGB 10.7 (L)  08/29/2017   HCT 32.6 (L) 08/29/2017   MCV 88.6 08/29/2017   PLT 284 08/29/2017   Lab Results  Component Value Date   ALT 18 09/24/2019   AST 31 09/24/2019   ALKPHOS 67 09/24/2019   BILITOT 0.4 09/24/2019     Review of Systems  Constitutional: Negative.  Negative for chills, fatigue, fever, malaise/fatigue and unexpected weight change.  HENT: Negative for congestion, ear discharge, ear pain, rhinorrhea, sinus pressure, sneezing and sore throat.   Eyes: Negative for blurred vision, photophobia, pain, discharge, redness and itching.  Respiratory: Negative for cough, shortness of breath, wheezing and stridor.   Cardiovascular: Negative for chest pain, palpitations, orthopnea and PND.  Gastrointestinal: Negative for abdominal pain, blood in stool, constipation, diarrhea, nausea and vomiting.  Endocrine: Negative for cold intolerance, heat intolerance, polydipsia, polyphagia and polyuria.  Genitourinary: Negative for dysuria, flank pain, frequency, hematuria, menstrual problem, pelvic pain, urgency, vaginal bleeding and vaginal discharge.  Musculoskeletal: Negative for arthralgias, back pain, myalgias and neck pain.  Skin: Negative for rash.  Allergic/Immunologic: Negative for environmental allergies and food allergies.  Neurological: Negative for dizziness, weakness, light-headedness, numbness and headaches.  Hematological: Negative for adenopathy. Does not bruise/bleed easily.  Psychiatric/Behavioral: Negative for dysphoric mood. The patient is not nervous/anxious.     Patient Active Problem List   Diagnosis Date Noted  . Elevated transaminase level 04/10/2019  . Primary osteoarthritis of knee 10/06/2016  . Type 2 diabetes mellitus without complications ([place]) 97/67/3419  . 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 Essential hypertension 06/20/2014  . Routine sports  examination 06/20/2014  . Health examination of defined  subpopulation 06/20/2014  . Hyperlipidemia due to type 2 diabetes mellitus ([place]) 06/20/2014    No Known Allergies  Past Surgical History:  Procedure Laterality Date  . APPENDECTOMY    . gallstones  06/12/2009    Social History   Tobacco Use  . Smoking status: Former Research scientist (life sciences)  . Smokeless tobacco: Never Used  Vaping Use  . Vaping Use: Never used  Substance Use Topics  . Alcohol use: No    Alcohol/week: 0.0 standard drinks  . Drug use: No     Medication list has been reviewed and updated.  Current Meds  Medication Sig  . aspirin 81 MG tablet Take 1 tablet by mouth daily.  Marland Kitchen DILT-XR 240 MG 24 hr capsule TAKE 1 CAPSULE EVERY DAY (OFFICE VISIT NEEDED FOR ADDITIONAL REFILLS  . glucose blood (ACCU-CHEK AVIVA PLUS) test strip USE TWICE DAILY AS DIRECTED  . lisinopril-hydrochlorothiazide (ZESTORETIC) 20-12.5 MG tablet Take 1 tablet by mouth daily.  . meloxicam (MOBIC) 7.5 MG tablet Take 1 tablet (7.5 mg total) by mouth daily.  . metFORMIN (GLUCOPHAGE) 500 MG tablet TAKE 1 TABLET TWICE DAILY (Patient taking differently: 500 mg. TAKE 1 TABLETin morning and 2 at night/ Dr Honor Junes)  . simvastatin (ZOCOR) 20 MG tablet Take 1 tablet (20 mg total) by mouth daily.  Marland Kitchen tiZANidine (ZANAFLEX) 4 MG tablet Take 1 tablet (4 mg total) by mouth at bedtime.   Current Facility-Administered Medications for the 05/14/20 encounter (Office Visit) with Juline Patch, MD  Medication  . albuterol (PROVENTIL) (2.5 MG/3ML) 0.083% nebulizer solution 2.5 mg    PHQ 2/9 Scores 05/14/2020 09/24/2019 07/16/2019 07/04/2019  PHQ - 2 Score 0 0 0 0  PHQ- 9 Score 0 0 0 -    GAD 7 : Generalized Anxiety Score 05/14/2020 09/24/2019 07/16/2019 04/10/2019  Nervous, Anxious, on Edge 0 0 0 0  Control/stop worrying 0 0 0 0  Worry too much - different things 0 0 0 0  Trouble relaxing 0 0 0 0  Restless 0 0 0 0  Easily annoyed or irritable 0 0 0 0  Afraid - awful might happen 0 0 0 0  Total GAD 7 Score 0 0 0 0  Anxiety Difficulty -  Not difficult at all - -    BP Readings from Last 3 Encounters:  05/14/20 140/70  09/24/19 138/64  07/16/19 120/60    Physical Exam Vitals and nursing note reviewed.  Constitutional:      Appearance: She is well-developed.  HENT:     Head: Normocephalic.     Right Ear: External ear normal.     Left Ear: External ear normal.  Eyes:     General: Lids are everted, no foreign bodies appreciated. No scleral icterus.       Left eye: No foreign body or hordeolum.     Conjunctiva/sclera: Conjunctivae normal.     Right eye: Right conjunctiva is not injected.     Left eye: Left conjunctiva is not injected.     Pupils: Pupils are equal, round, and reactive to light.  Neck:     Thyroid: No thyromegaly.     Vascular: No JVD.     Trachea: No tracheal deviation.  Cardiovascular:     Rate and Rhythm: Normal rate and regular rhythm.     Heart sounds: Normal heart sounds. No murmur heard. No friction rub. No gallop.   Pulmonary:     Effort: Pulmonary effort is normal.  No respiratory distress.     Breath sounds: Normal breath sounds. No wheezing or rales.  Abdominal:     General: Bowel sounds are normal.     Palpations: Abdomen is soft. There is no mass.     Tenderness: There is no abdominal tenderness. There is no guarding or rebound.  Musculoskeletal:        General: No tenderness. Normal range of motion.     Cervical back: Normal range of motion and neck supple.  Lymphadenopathy:     Cervical: No cervical adenopathy.  Skin:    General: Skin is warm.     Findings: No rash.  Neurological:     Mental Status: She is alert and oriented to person, place, and time.     Cranial Nerves: No cranial nerve deficit.     Deep Tendon Reflexes: Reflexes normal.  Psychiatric:        Mood and Affect: Mood is not anxious or depressed.     Wt Readings from Last 3 Encounters:  05/14/20 164 lb (74.4 kg)  09/24/19 169 lb (76.7 kg)  07/16/19 171 lb (77.6 kg)    BP 140/70   Pulse 60   Ht 5\' 6"   (1.676 m)   Wt 164 lb (74.4 kg)   BMI 26.47 kg/m   Assessment and Plan: 1. Essential hypertension Chronic.  Controlled.  Stable.  Blood pressure today is noted to be 138/70.  We will continue diltiazem XR to 40 mg once a day and lisinopril hydrochlorothiazide 20-12.5 mg once a day.  Will check renal panel for surveillance of electrolytes and GFR. - diltiazem (DILT-XR) 240 MG 24 hr capsule; TAKE 1 CAPSULE EVERY DAY  Dispense: 90 capsule; Refill: 1 - lisinopril-hydrochlorothiazide (ZESTORETIC) 20-12.5 MG tablet; Take 1 tablet by mouth daily.  Dispense: 90 tablet; Refill: 1 - Renal Function Panel  2. Familial multiple lipoprotein-type hyperlipidemia Chronic.  Controlled.  Stable.  Continue simvastatin 20 mg once a day.  Review of previous labs with acceptable lipid panel and will repeat this in 6 months. - simvastatin (ZOCOR) 20 MG tablet; Take 1 tablet (20 mg total) by mouth daily.  Dispense: 90 tablet; Refill: 1

## 2020-05-15 LAB — RENAL FUNCTION PANEL
Albumin: 4.7 g/dL — ABNORMAL HIGH (ref 3.6–4.6)
BUN/Creatinine Ratio: 17 (ref 12–28)
BUN: 23 mg/dL (ref 8–27)
CO2: 22 mmol/L (ref 20–29)
Calcium: 10 mg/dL (ref 8.7–10.3)
Chloride: 100 mmol/L (ref 96–106)
Creatinine, Ser: 1.33 mg/dL — ABNORMAL HIGH (ref 0.57–1.00)
Glucose: 123 mg/dL — ABNORMAL HIGH (ref 65–99)
Phosphorus: 3.5 mg/dL (ref 3.0–4.3)
Potassium: 4.2 mmol/L (ref 3.5–5.2)
Sodium: 140 mmol/L (ref 134–144)
eGFR: 40 mL/min/{1.73_m2} — ABNORMAL LOW (ref >59)

## 2020-06-10 ENCOUNTER — Other Ambulatory Visit: Payer: Self-pay

## 2020-06-10 DIAGNOSIS — M1710 Unilateral primary osteoarthritis, unspecified knee: Secondary | ICD-10-CM

## 2020-06-10 MED ORDER — TIZANIDINE HCL 4 MG PO TABS: 4.0000 mg | ORAL_TABLET | Freq: Every day | ORAL | 1 refills | Status: DC

## 2020-06-11 ENCOUNTER — Telehealth: Payer: Self-pay | Admitting: Family Medicine

## 2020-06-11 ENCOUNTER — Other Ambulatory Visit: Payer: Self-pay | Admitting: Family Medicine

## 2020-06-11 NOTE — Telephone Encounter (Signed)
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

## 2020-06-11 NOTE — Telephone Encounter (Signed)
Pt asked if Dr. Ronnald Ramp can call in something asap for her Gout / Pt stated its getting bad and she can not put on her shoe/ please advise asap

## 2020-06-27 LAB — HM DIABETES EYE EXAM

## 2020-06-30 DIAGNOSIS — E1129 Type 2 diabetes mellitus with other diabetic kidney complication: Secondary | ICD-10-CM | POA: Diagnosis not present

## 2020-06-30 DIAGNOSIS — R809 Proteinuria, unspecified: Secondary | ICD-10-CM | POA: Diagnosis not present

## 2020-06-30 LAB — HEMOGLOBIN A1C: Hemoglobin A1C: 7.7

## 2020-07-09 ENCOUNTER — Ambulatory Visit (INDEPENDENT_AMBULATORY_CARE_PROVIDER_SITE_OTHER): Payer: Medicare HMO

## 2020-07-09 DIAGNOSIS — Z0000 Encounter for general adult medical examination without abnormal findings: Secondary | ICD-10-CM

## 2020-07-09 NOTE — Patient Instructions (Signed)
April [name] , Thank you for taking time to come for your Medicare Wellness Visit. I appreciate your ongoing commitment to your health goals. Please review the following plan we discussed and let me know if I can assist you in the future.   Screening recommendations/referrals: Colonoscopy: no longer required Mammogram: done 12/26/19 Bone Density: done 09/27/17. Please call [phone] to schedule your bone density screening.  Recommended yearly ophthalmology/optometry visit for glaucoma screening and checkup Recommended yearly dental visit for hygiene and checkup  Vaccinations: Influenza vaccine: done 11/27/19 Pneumococcal vaccine: done 06/08/19 Tdap vaccine: due Shingles vaccine: Shingrix discussed. Please contact your pharmacy for coverage information.  Covid-19: done 04/15/19, 05/07/19 & 10/24/19  Advanced directives: Please bring a copy of your health care power of attorney and living will to the office at your convenience.  Conditions/risks identified: recommend healthy eating and physical activity to lower A1c  Next appointment: Follow up in one year for your annual wellness visit    Preventive Care 65 Years and Older, Female Preventive care refers to lifestyle choices and visits with your health care provider that can promote health and wellness. What does preventive care include?  A yearly physical exam. This is also called an annual well check.  Dental exams once or twice a year.  Routine eye exams. Ask your health care provider how often you should have your eyes checked.  Personal lifestyle choices, including:  Daily care of your teeth and gums.  Regular physical activity.  Eating a healthy diet.  Avoiding tobacco and drug use.  Limiting alcohol use.  Practicing safe sex.  Taking low-dose aspirin every day.  Taking vitamin and mineral supplements as recommended by your health care provider. What happens during an annual well check? The services and screenings  done by your health care provider during your annual well check will depend on your age, overall health, lifestyle risk factors, and family history of disease. Counseling  Your health care provider may ask you questions about your:  Alcohol use.  Tobacco use.  Drug use.  Emotional well-being.  Home and relationship well-being.  Sexual activity.  Eating habits.  History of falls.  Memory and ability to understand (cognition).  Work and work Statistician.  Reproductive health. Screening  You may have the following tests or measurements:  Height, weight, and BMI.  Blood pressure.  Lipid and cholesterol levels. These may be checked every 5 years, or more frequently if you are over 51 years old.  Skin check.  Lung cancer screening. You may have this screening every year starting at age 15 if you have a 30-pack-year history of smoking and currently smoke or have quit within the past 15 years.  Fecal occult blood test (FOBT) of the stool. You may have this test every year starting at age 26.  Flexible sigmoidoscopy or colonoscopy. You may have a sigmoidoscopy every 5 years or a colonoscopy every 10 years starting at age 6.  Hepatitis C blood test.  Hepatitis B blood test.  Sexually transmitted disease (STD) testing.  Diabetes screening. This is done by checking your blood sugar (glucose) after you have not eaten for a while (fasting). You may have this done every 1-3 years.  Bone density scan. This is done to screen for osteoporosis. You may have this done starting at age 77.  Mammogram. This may be done every 1-2 years. Talk to your health care provider about how often you should have regular mammograms. Talk with your health care provider about your test  results, treatment options, and if necessary, the need for more tests. Vaccines  Your health care provider may recommend certain vaccines, such as:  Influenza vaccine. This is recommended every year.  Tetanus,  diphtheria, and acellular pertussis (Tdap, Td) vaccine. You may need a Td booster every 10 years.  Zoster vaccine. You may need this after age 17.  Pneumococcal 13-valent conjugate (PCV13) vaccine. One dose is recommended after age 53.  Pneumococcal polysaccharide (PPSV23) vaccine. One dose is recommended after age 56. Talk to your health care provider about which screenings and vaccines you need and how often you need them. This information is not intended to replace advice given to you by your health care provider. Make sure you discuss any questions you have with your health care provider. Document Released: 02/21/2015 Document Revised: 10/15/2015 Document Reviewed: 11/26/2014 Elsevier Interactive Patient Education  2017 [employer] Prevention in the Home Falls can cause injuries. They can happen to people of all ages. There are many things you can do to make your home safe and to help prevent falls. What can I do on the outside of my home?  Regularly fix the edges of walkways and driveways and fix any cracks.  Remove anything that might make you trip as you walk through a door, such as a raised step or threshold.  Trim any bushes or trees on the path to your home.  Use bright outdoor lighting.  Clear any walking paths of anything that might make someone trip, such as rocks or tools.  Regularly check to see if handrails are loose or broken. Make sure that both sides of any steps have handrails.  Any raised decks and porches should have guardrails on the edges.  Have any leaves, snow, or ice cleared regularly.  Use sand or salt on walking paths during winter.  Clean up any spills in your garage right away. This includes oil or grease spills. What can I do in the bathroom?  Use night lights.  Install grab bars by the toilet and in the tub and shower. Do not use towel bars as grab bars.  Use non-skid mats or decals in the tub or shower.  If you need to sit down in  the shower, use a plastic, non-slip stool.  Keep the floor dry. Clean up any water that spills on the floor as soon as it happens.  Remove soap buildup in the tub or shower regularly.  Attach bath mats securely with double-sided non-slip rug tape.  Do not have throw rugs and other things on the floor that can make you trip. What can I do in the bedroom?  Use night lights.  Make sure that you have a light by your bed that is easy to reach.  Do not use any sheets or blankets that are too big for your bed. They should not hang down onto the floor.  Have a firm chair that has side arms. You can use this for support while you get dressed.  Do not have throw rugs and other things on the floor that can make you trip. What can I do in the kitchen?  Clean up any spills right away.  Avoid walking on wet floors.  Keep items that you use a lot in easy-to-reach places.  If you need to reach something above you, use a strong step stool that has a grab bar.  Keep electrical cords out of the way.  Do not use floor polish or wax that makes  floors slippery. If you must use wax, use non-skid floor wax.  Do not have throw rugs and other things on the floor that can make you trip. What can I do with my stairs?  Do not leave any items on the stairs.  Make sure that there are handrails on both sides of the stairs and use them. Fix handrails that are broken or loose. Make sure that handrails are as long as the stairways.  Check any carpeting to make sure that it is firmly attached to the stairs. Fix any carpet that is loose or worn.  Avoid having throw rugs at the top or bottom of the stairs. If you do have throw rugs, attach them to the floor with carpet tape.  Make sure that you have a light switch at the top of the stairs and the bottom of the stairs. If you do not have them, ask someone to add them for you. What else can I do to help prevent falls?  Wear shoes that:  Do not have high  heels.  Have rubber bottoms.  Are comfortable and fit you well.  Are closed at the toe. Do not wear sandals.  If you use a stepladder:  Make sure that it is fully opened. Do not climb a closed stepladder.  Make sure that both sides of the stepladder are locked into place.  Ask someone to hold it for you, if possible.  Clearly mark and make sure that you can see:  Any grab bars or handrails.  First and last steps.  Where the edge of each step is.  Use tools that help you move around (mobility aids) if they are needed. These include:  Canes.  Walkers.  Scooters.  Crutches.  Turn on the lights when you go into a dark area. Replace any light bulbs as soon as they burn out.  Set up your furniture so you have a clear path. Avoid moving your furniture around.  If any of your floors are uneven, fix them.  If there are any pets around you, be aware of where they are.  Review your medicines with your doctor. Some medicines can make you feel dizzy. This can increase your chance of falling. Ask your doctor what other things that you can do to help prevent falls. This information is not intended to replace advice given to you by your health care provider. Make sure you discuss any questions you have with your health care provider. Document Released: 11/21/2008 Document Revised: 07/03/2015 Document Reviewed: 03/01/2014 Elsevier Interactive Patient Education  2017 [employer].

## 2020-07-09 NOTE — Progress Notes (Signed)
Subjective:   April [name] is a 80 y.o. female who presents for Medicare Annual (Subsequent) preventive examination.  Virtual Visit via Telephone Note  I connected with  April [name] on 07/09/20 at  9:20 AM EDT by telephone and verified that I am speaking with the correct person using two identifiers.  Location: Patient: home Provider: [facility] Persons participating in the virtual visit: [place]   I discussed the limitations, risks, security and privacy concerns of performing an evaluation and management service by telephone and the availability of in person appointments. The patient expressed understanding and agreed to proceed.  Interactive audio and video telecommunications were attempted between this nurse and patient, however failed, due to patient having technical difficulties OR patient did not have access to video capability.  We continued and completed visit with audio only.  Some vital signs may be absent or patient reported.   Clemetine Marker, LPN    Review of Systems    Cardiac Risk Factors include: advanced age (>56men, >45 women);diabetes mellitus;dyslipidemia;hypertension     Objective:    There were no vitals filed for this visit. There is no height or weight on file to calculate BMI.  Advanced Directives 07/09/2020 07/04/2019 02/11/2018 08/29/2017 12/26/2016 06/10/2016 02/18/2015  Does Patient Have a Medical Advance Directive? Yes No No No No No No  Type of Paramedic of [place];Living will - - - - - -  Does patient want to make changes to medical advance directive? - - - - - - -  Copy of [place] in Chart? No - copy requested - - - - - -  Would patient like information on creating a medical advance directive? - Yes (MAU/Ambulatory/Procedural Areas - Information given) - No - Patient declined Yes (ED - Information included in AVS) - Yes - Educational materials given    Current Medications  (verified) Outpatient Encounter Medications as of 07/09/2020  Medication Sig  . aspirin 81 MG tablet Take 1 tablet by mouth daily.  Marland Kitchen diltiazem (DILT-XR) 240 MG 24 hr capsule TAKE 1 CAPSULE EVERY DAY  . glucose blood (ACCU-CHEK AVIVA PLUS) test strip USE TWICE DAILY AS DIRECTED  . lisinopril-hydrochlorothiazide (ZESTORETIC) 20-12.5 MG tablet Take 1 tablet by mouth daily.  . meloxicam (MOBIC) 7.5 MG tablet Take 1 tablet (7.5 mg total) by mouth daily.  . metFORMIN (GLUCOPHAGE) 500 MG tablet TAKE 1 TABLET TWICE DAILY (Patient taking differently: 500 mg. TAKE 1 TABLETin morning and 2 at night/ Dr Honor Junes)  . simvastatin (ZOCOR) 20 MG tablet Take 1 tablet (20 mg total) by mouth daily.  Marland Kitchen tiZANidine (ZANAFLEX) 4 MG tablet Take 1 tablet (4 mg total) by mouth at bedtime.   Facility-Administered Encounter Medications as of 07/09/2020  Medication  . albuterol (PROVENTIL) (2.5 MG/3ML) 0.083% nebulizer solution 2.5 mg    Allergies (verified) Patient has no known allergies.   History: Past Medical History:  Diagnosis Date  . Diabetes mellitus without complication ([place])   . Gout   . Hyperlipidemia   . Hypertension    Past Surgical History:  Procedure Laterality Date  . APPENDECTOMY    . gallstones  06/12/2009   Family History  Problem Relation Age of Onset  . Breast cancer Neg Hx    Social History   Socioeconomic History  . Marital status: Widowed    Spouse name: Not on file  . Number of children: Not on file  . Years of education: Not on file  .  Highest education level: Not on file  Occupational History  . Not on file  Tobacco Use  . Smoking status: Former Research scientist (life sciences)  . Smokeless tobacco: Never Used  Vaping Use  . Vaping Use: Never used  Substance and Sexual Activity  . Alcohol use: No    Alcohol/week: 0.0 standard drinks  . Drug use: No  . Sexual activity: Not Currently  Other Topics Concern  . Not on file  Social History Narrative   Pt lives with her son and his family    Social Determinants of Health   Financial Resource Strain: Low Risk   . Difficulty of Paying Living Expenses: Not hard at all  Food Insecurity: No Food Insecurity  . Worried About Charity fundraiser in the Last Year: Never true  . Ran Out of Food in the Last Year: Never true  Transportation Needs: No Transportation Needs  . Lack of Transportation (Medical): No  . Lack of Transportation (Non-Medical): No  Physical Activity: Insufficiently Active  . Days of Exercise per Week: 3 days  . Minutes of Exercise per Session: 30 min  Stress: No Stress Concern Present  . Feeling of Stress : Not at all  Social Connections: Moderately Isolated  . Frequency of Communication with Friends and Family: More than three times a week  . Frequency of Social Gatherings with Friends and Family: More than three times a week  . Attends Religious Services: More than 4 times per year  . Active Member of Clubs or Organizations: No  . Attends Archivist Meetings: Never  . Marital Status: Widowed    Tobacco Counseling Counseling given: Not Answered   Clinical Intake:  Pre-visit preparation completed: Yes  Pain : No/denies pain     Nutritional Risks: None Diabetes: Yes CBG done?: No Did pt. bring in CBG monitor from home?: No  How often do you need to have someone help you when you read instructions, pamphlets, or other written materials from your doctor or pharmacy?: 1 - Never  Nutrition Risk Assessment:  Has the patient had any N/V/D within the last 2 months?  No  Does the patient have any non-healing wounds?  No  Has the patient had any unintentional weight loss or weight gain?  No   Diabetes:  Is the patient diabetic?  Yes  If diabetic, was a CBG obtained today?  No  Did the patient bring in their glucometer from home?  No  How often do you monitor your CBG's? Twice daily.   Financial Strains and Diabetes Management:  Are you having any financial strains with the device,  your supplies or your medication? No .  Does the patient want to be seen by Chronic Care Management for management of their diabetes?  No  Would the patient like to be referred to a Nutritionist or for Diabetic Management?  No   Diabetic Exams:  Diabetic Eye Exam: Completed 06/27/20 Dr. Wyatt Portela.   Diabetic Foot Exam: Completed 01/25/20.    Interpreter Needed?: No  Information entered by :: Clemetine Marker LPN   Activities of Daily Living In your present state of health, do you have any difficulty performing the following activities: 07/09/2020  Hearing? N  Comment declines hearing aids  Vision? N  Difficulty concentrating or making decisions? N  Walking or climbing stairs? N  Dressing or bathing? N  Doing errands, shopping? N  Preparing Food and eating ? N  Using the Toilet? N  In the past six months, have you  accidently leaked urine? N  Do you have problems with loss of bowel control? N  Managing your Medications? N  Managing your Finances? N  Housekeeping or managing your Housekeeping? N  Some recent data might be hidden    Patient Care Team: Juline Patch, MD as PCP - General (Family Medicine) Lonia Farber, MD as Consulting Physician (Internal Medicine)  Indicate any recent Medical Services you may have received from other than [facility] providers in the past year (date may be approximate).     Assessment:   This is a routine wellness examination for April [name].  Hearing/Vision screen  Hearing Screening   125Hz  250Hz  500Hz  1000Hz  2000Hz  3000Hz  4000Hz  6000Hz  8000Hz   Right ear:           Left ear:           Comments:  Pt denies hearing difficulty  Vision Screening Comments: Annual vision screenings with Dr. Wyatt Portela at [facility] issues and exercise activities discussed: Current Exercise Habits: Home exercise routine, Type of exercise: walking, Time (Minutes): 30, Frequency (Times/Week): 3, Weekly Exercise (Minutes/Week): 90, Intensity: Mild, Exercise limited by:  None identified  Goals Addressed            This Visit's Progress   . Patient Stated       Patient states she would like to lower A1c to less than 7% with healthy eating and physical activity       Depression Screen PHQ 2/9 Scores 07/09/2020 05/14/2020 09/24/2019 07/16/2019 07/04/2019 04/10/2019 10/26/2018  PHQ - 2 Score 0 0 0 0 0 0 0  PHQ- 9 Score - 0 0 0 - 0 0    Fall Risk Fall Risk  07/09/2020 07/16/2019 07/04/2019 04/10/2019 11/11/2017  Falls in the past year? 0 0 0 0 No  Number falls in past yr: 0 - 0 - -  Injury with Fall? 0 - 0 - -  Risk for fall due to : No Fall Risks - No Fall Risks - -  Follow up Falls prevention discussed Falls evaluation completed Falls prevention discussed Falls evaluation completed -    FALL RISK PREVENTION PERTAINING TO THE HOME:  Any stairs in or around the home? Yes  If so, are there any without handrails? No  Home free of loose throw rugs in walkways, pet beds, electrical cords, etc? Yes  Adequate lighting in your home to reduce risk of falls? Yes   ASSISTIVE DEVICES UTILIZED TO PREVENT FALLS:  Life alert? No  Use of a cane, walker or w/c? No  Grab bars in the bathroom? No  Shower chair or bench in shower? No  Elevated toilet seat or a handicapped toilet? No   TIMED UP AND GO:  Was the test performed? No . Telephonic visit.   Cognitive Function: Normal cognitive status assessed by direct observation by this Nurse Health Advisor. No abnormalities found.       6CIT Screen 07/04/2019  What Year? 0 points  What month? 0 points  What time? 0 points  Count back from 20 0 points  Months in reverse 0 points  Repeat phrase 0 points  Total Score 0    Immunizations Immunization History  Administered Date(s) Administered  . Fluad Quad(high Dose 65+) 10/26/2018, 11/27/2019  . Influenza, High Dose Seasonal PF 10/06/2016  . Influenza,inj,Quad PF,6+ Mos 11/02/2012  . Influenza-Unspecified 12/09/2017  . PFIZER(Purple Top)SARS-COV-2 Vaccination  04/15/2019, 05/07/2019, 10/24/2019  . Pneumococcal Conjugate-13 10/06/2016  . Pneumococcal Polysaccharide-23 06/08/2019    TDAP status:  Due, Education has been provided regarding the importance of this vaccine. Advised may receive this vaccine at local pharmacy or Health Dept. Aware to provide a copy of the vaccination record if obtained from local pharmacy or Health Dept. Verbalized acceptance and understanding.  Flu Vaccine status: Up to date  Pneumococcal vaccine status: Up to date  Covid-19 vaccine status: Completed vaccines  Qualifies for Shingles Vaccine? Yes   Zostavax completed No   Shingrix Completed?: No.    Education has been provided regarding the importance of this vaccine. Patient has been advised to call insurance company to determine out of pocket expense if they have not yet received this vaccine. Advised may also receive vaccine at local pharmacy or Health Dept. Verbalized acceptance and understanding.  Screening Tests Health Maintenance  Topic Date Due  . Zoster Vaccines- Shingrix (1 of 2) Never done  . TETANUS/TDAP  05/14/2021 (Originally 03/13/1959)  . HEMOGLOBIN A1C  07/25/2020  . INFLUENZA VACCINE  09/08/2020  . FOOT EXAM  01/24/2021  . OPHTHALMOLOGY EXAM  06/27/2021  . DEXA SCAN  Completed  . COVID-19 Vaccine  Completed  . PNA vac Low Risk Adult  Completed  . HPV VACCINES  Aged Out    Health Maintenance  Health Maintenance Due  Topic Date Due  . Zoster Vaccines- Shingrix (1 of 2) Never done    Colorectal cancer screening: No longer required.   Mammogram status: Completed 12/26/19. Repeat every year  Bone Density status: Completed 09/27/17. Results reflect: Bone density results: OSTEOPENIA. Repeat every 2 years.  Lung Cancer Screening: (Low Dose CT Chest recommended if Age 53-80 years, 30 pack-year currently smoking OR have quit w/in 15years.) does not qualify.   Additional Screening:  Hepatitis C Screening: does not qualify.   Vision Screening:  Recommended annual ophthalmology exams for early detection of glaucoma and other disorders of the eye. Is the patient up to date with their annual eye exam?  Yes  Who is the provider or what is the name of the office in which the patient attends annual eye exams? Dr. Wyatt Portela.   Dental Screening: Recommended annual dental exams for proper oral hygiene  Community Resource Referral / Chronic Care Management: CRR required this visit?  No   CCM required this visit?  No      Plan:     I have personally reviewed and noted the following in the patient's chart:   . Medical and social history . Use of alcohol, tobacco or illicit drugs  . Current medications and supplements including opioid prescriptions.  . Functional ability and status . Nutritional status . Physical activity . Advanced directives . List of other physicians . Hospitalizations, surgeries, and ER visits in previous 12 months . Vitals . Screenings to include cognitive, depression, and falls . Referrals and appointments  In addition, I have reviewed and discussed with patient certain preventive protocols, quality metrics, and best practice recommendations. A written personalized care plan for preventive services as well as general preventive health recommendations were provided to patient.     Clemetine Marker, LPN   09/09/9560   Nurse Notes: none

## 2020-07-21 DIAGNOSIS — E785 Hyperlipidemia, unspecified: Secondary | ICD-10-CM | POA: Diagnosis not present

## 2020-07-21 DIAGNOSIS — E1129 Type 2 diabetes mellitus with other diabetic kidney complication: Secondary | ICD-10-CM | POA: Diagnosis not present

## 2020-07-21 DIAGNOSIS — I152 Hypertension secondary to endocrine disorders: Secondary | ICD-10-CM | POA: Diagnosis not present

## 2020-07-21 DIAGNOSIS — E1159 Type 2 diabetes mellitus with other circulatory complications: Secondary | ICD-10-CM | POA: Diagnosis not present

## 2020-07-21 DIAGNOSIS — R809 Proteinuria, unspecified: Secondary | ICD-10-CM | POA: Diagnosis not present

## 2020-07-21 DIAGNOSIS — E1169 Type 2 diabetes mellitus with other specified complication: Secondary | ICD-10-CM | POA: Diagnosis not present

## 2020-07-21 LAB — BASIC METABOLIC PANEL
BUN: 27 — AB (ref 4–21)
Creatinine: 1.2 — AB (ref 0.5–1.1)
Glucose: 83
Potassium: 4.1 (ref 3.4–5.3)
Sodium: 142 (ref 137–147)

## 2020-07-21 LAB — HEPATIC FUNCTION PANEL
ALT: 9 (ref 7–35)
AST: 19 (ref 13–35)

## 2020-07-21 LAB — LIPID PANEL
Cholesterol: 142 (ref 0–200)
HDL: 52 (ref 35–70)
LDL Cholesterol: 72
Triglycerides: 86 (ref 40–160)

## 2020-07-21 LAB — TSH: TSH: 1 (ref 0.41–5.90)

## 2020-07-21 LAB — MICROALBUMIN, URINE: Microalb, Ur: 37.4

## 2020-08-04 ENCOUNTER — Other Ambulatory Visit: Payer: Self-pay | Admitting: Family Medicine

## 2020-08-04 DIAGNOSIS — M1710 Unilateral primary osteoarthritis, unspecified knee: Secondary | ICD-10-CM

## 2020-09-24 ENCOUNTER — Other Ambulatory Visit: Payer: Self-pay | Admitting: Family Medicine

## 2020-09-24 DIAGNOSIS — E7849 Other hyperlipidemia: Secondary | ICD-10-CM

## 2020-09-24 NOTE — Telephone Encounter (Signed)
Requested medication (s) are due for refill today: Yes  Requested medication (s) are on the active medication list: Yes  Last refill:  05/14/20  Future visit scheduled: Yes  Notes to clinic:  Last labs 09/24/19    Requested Prescriptions  Pending Prescriptions Disp Refills   simvastatin (ZOCOR) 20 MG tablet [Pharmacy Med Name: SIMVASTATIN 20 MG Tablet] 90 tablet 1    Sig: TAKE 1 TABLET EVERY DAY     Cardiovascular:  Antilipid - Statins Failed - 09/24/2020  2:09 PM      Failed - Total Cholesterol in normal range and within 360 days    Cholesterol, Total  Date Value Ref Range Status  09/24/2019 142 100 - 199 mg/dL Final          Failed - LDL in normal range and within 360 days    LDL Chol Calc (NIH)  Date Value Ref Range Status  09/24/2019 73 0 - 99 mg/dL Final          Failed - HDL in normal range and within 360 days    HDL  Date Value Ref Range Status  09/24/2019 46 >39 mg/dL Final          Failed - Triglycerides in normal range and within 360 days    Triglycerides  Date Value Ref Range Status  09/24/2019 133 0 - 149 mg/dL Final          Passed - Patient is not pregnant      Passed - Valid encounter within last 12 months    Recent Outpatient Visits           4 months ago Essential hypertension   [place], Deanna C, MD   1 year ago Type 2 diabetes mellitus without complication, without long-term current use of insulin ([place])   [facility] Juline Patch, MD   1 year ago Epicondylitis, lateral, right   [facility] Juline Patch, MD   1 year ago Essential hypertension   [place], Deanna C, MD   1 year ago Essential hypertension   [place], Deanna C, MD       Future Appointments             In 1 month Juline Patch, MD [facility], [facility]

## 2020-10-03 ENCOUNTER — Other Ambulatory Visit: Payer: Self-pay | Admitting: Family Medicine

## 2020-10-03 DIAGNOSIS — M1710 Unilateral primary osteoarthritis, unspecified knee: Secondary | ICD-10-CM

## 2020-10-03 MED ORDER — TIZANIDINE HCL 4 MG PO TABS: ORAL_TABLET | ORAL | 0 refills | Status: DC

## 2020-10-03 MED ORDER — MELOXICAM 7.5 MG PO TABS
7.5000 mg | ORAL_TABLET | Freq: Every day | ORAL | 0 refills | Status: DC
Start: 2020-10-03 — End: 2020-11-05

## 2020-10-03 NOTE — Telephone Encounter (Signed)
Medication Refill - Medication: Meloxicam 7.5 ,  Tiazadine 4 mg  Has the patient contacted their pharmacy? No. (Agent: If no, request that the patient contact the pharmacy for the refill.) (Agent: If yes, when and what did the pharmacy advise?)  Preferred Pharmacy (with phone number or street name): [facility]  Mail order  Agent: Please be advised that RX refills may take up to 3 business days. We ask that you follow-up with your pharmacy.

## 2020-10-03 NOTE — Telephone Encounter (Signed)
Requested medication (s) are due for refill today: mobic expired medication  Requested medication (s) are on the active medication list: yes  Last refill:  mobic- 09/24/19  #90 1 refill, zanaflex - 08/04/20 #30 1 refill  Future visit scheduled: yes in 1 month  Notes to clinic:  mobic - expired medication, do you want to renew Rx? Zanaflex- not delegated per protocol     Requested Prescriptions  Pending Prescriptions Disp Refills   tiZANidine (ZANAFLEX) 4 MG tablet 30 tablet 1    Sig: TAKE 1 TABLET(4 MG) BY MOUTH AT BEDTIME     Not Delegated - Cardiovascular:  Alpha-2 Agonists - tizanidine Failed - 10/03/2020 10:25 AM      Failed - This refill cannot be delegated      Passed - Valid encounter within last 6 months    Recent Outpatient Visits           4 months ago Essential hypertension   [facility] Juline Patch, MD   1 year ago Type 2 diabetes mellitus without complication, without long-term current use of insulin ([place])   [facility] Juline Patch, MD   1 year ago Epicondylitis, lateral, right   [facility] Juline Patch, MD   1 year ago Essential hypertension   [facility] Juline Patch, MD   1 year ago Essential hypertension   [facility] Juline Patch, MD       Future Appointments             In 1 month Juline Patch, MD [facility]             meloxicam (MOBIC) 7.5 MG tablet 90 tablet 1    Sig: Take 1 tablet (7.5 mg total) by mouth daily.     Analgesics:  COX2 Inhibitors Failed - 10/03/2020 10:25 AM      Failed - HGB in normal range and within 360 days    Hemoglobin  Date Value Ref Range Status  08/29/2017 10.7 (L) 12.0 - 16.0 g/dL Final          Failed - Cr in normal range and within 360 days    Creatinine, Ser  Date Value Ref Range Status  05/14/2020 1.33 (H) 0.57 - 1.00 mg/dL Final          Passed - Patient is not pregnant      Passed - Valid encounter within last 12  months    Recent Outpatient Visits           4 months ago Essential hypertension   [place], Deanna C, MD   1 year ago Type 2 diabetes mellitus without complication, without long-term current use of insulin ([place])   [facility] Juline Patch, MD   1 year ago Epicondylitis, lateral, right   [facility] Juline Patch, MD   1 year ago Essential hypertension   [place], Deanna C, MD   1 year ago Essential hypertension   [place], Deanna C, MD       Future Appointments             In 1 month Juline Patch, MD [facility]

## 2020-10-05 ENCOUNTER — Other Ambulatory Visit: Payer: Self-pay | Admitting: Family Medicine

## 2020-10-05 DIAGNOSIS — M1710 Unilateral primary osteoarthritis, unspecified knee: Secondary | ICD-10-CM

## 2020-10-05 NOTE — Telephone Encounter (Signed)
Requested medication (s) are due for refill today: no  Requested medication (s) are on the active medication list: yes  Last refill:  10/03/20 #30  Future visit scheduled: yes  Notes to clinic:  NT not delegated to refuse or refill this med-- requested too soon   Requested Prescriptions  Pending Prescriptions Disp Refills   tiZANidine (ZANAFLEX) 4 MG tablet [Pharmacy Med Name: TIZANIDINE '4MG'$  TABLETS] 30 tablet 0    Sig: TAKE 1 TABLET(4 MG) BY MOUTH AT BEDTIME     Not Delegated - Cardiovascular:  Alpha-2 Agonists - tizanidine Failed - 10/05/2020  7:20 AM      Failed - This refill cannot be delegated      Passed - Valid encounter within last 6 months    Recent Outpatient Visits           4 months ago Essential hypertension   [place], Deanna C, MD   1 year ago Type 2 diabetes mellitus without complication, without long-term current use of insulin ([place])   [facility] Juline Patch, MD   1 year ago Epicondylitis, lateral, right   [facility] Juline Patch, MD   1 year ago Essential hypertension   [place], Deanna C, MD   1 year ago Essential hypertension   [place], Deanna C, MD       Future Appointments             In 1 month Juline Patch, MD [place]

## 2020-10-20 ENCOUNTER — Other Ambulatory Visit: Payer: Self-pay | Admitting: Family Medicine

## 2020-10-20 DIAGNOSIS — I10 Essential (primary) hypertension: Secondary | ICD-10-CM

## 2020-11-05 ENCOUNTER — Other Ambulatory Visit: Payer: Self-pay

## 2020-11-05 ENCOUNTER — Ambulatory Visit (INDEPENDENT_AMBULATORY_CARE_PROVIDER_SITE_OTHER): Payer: Medicare HMO | Admitting: Family Medicine

## 2020-11-05 ENCOUNTER — Encounter: Payer: Self-pay | Admitting: Family Medicine

## 2020-11-05 VITALS — BP 138/60 | HR 68 | Ht 66.0 in | Wt 167.0 lb

## 2020-11-05 DIAGNOSIS — I10 Essential (primary) hypertension: Secondary | ICD-10-CM

## 2020-11-05 DIAGNOSIS — E7849 Other hyperlipidemia: Secondary | ICD-10-CM

## 2020-11-05 DIAGNOSIS — Z23 Encounter for immunization: Secondary | ICD-10-CM | POA: Diagnosis not present

## 2020-11-05 DIAGNOSIS — M1710 Unilateral primary osteoarthritis, unspecified knee: Secondary | ICD-10-CM

## 2020-11-05 MED ORDER — LISINOPRIL-HYDROCHLOROTHIAZIDE 20-12.5 MG PO TABS: 1.0000 | ORAL_TABLET | Freq: Every day | ORAL | 1 refills | Status: DC

## 2020-11-05 MED ORDER — SIMVASTATIN 20 MG PO TABS: 20.0000 mg | ORAL_TABLET | Freq: Every day | ORAL | 1 refills | Status: DC

## 2020-11-05 MED ORDER — TIZANIDINE HCL 4 MG PO TABS: ORAL_TABLET | ORAL | 1 refills | Status: DC

## 2020-11-05 MED ORDER — DILTIAZEM HCL ER 240 MG PO CP24: ORAL_CAPSULE | ORAL | 1 refills | Status: DC

## 2020-11-05 MED ORDER — MELOXICAM 7.5 MG PO TABS: 7.5000 mg | ORAL_TABLET | Freq: Every day | ORAL | 1 refills | Status: DC

## 2020-11-05 NOTE — Progress Notes (Signed)
Date:  11/05/2020   Name:  April [name]   DOB:  17-Jul-1940   MRN:  [id number]   Chief Complaint: Flu Vaccine, pneum 20, Hypertension, Hyperlipidemia, and Arthritis  Hypertension This is a chronic problem. The current episode started more than 1 year ago. The problem has been resolved since onset. The problem is controlled. Pertinent negatives include no anxiety, blurred vision, chest pain, headaches, malaise/fatigue, neck pain, orthopnea, palpitations, peripheral edema or shortness of breath. Risk factors for coronary artery disease include diabetes mellitus, dyslipidemia and obesity. Past treatments include ACE inhibitors, diuretics and calcium channel blockers. The current treatment provides significant improvement. Compliance problems include exercise.  There is no history of angina, kidney disease, CAD/MI, CVA, heart failure, left ventricular hypertrophy, PVD or retinopathy. There is no history of chronic renal disease, a hypertension causing med or renovascular disease.  Hyperlipidemia This is a chronic problem. The current episode started more than 1 year ago. The problem is controlled. Recent lipid tests were reviewed and are normal. Exacerbating diseases include diabetes and obesity. She has no history of chronic renal disease, hypothyroidism or liver disease. Pertinent negatives include no chest pain, focal sensory loss, focal weakness, leg pain, myalgias or shortness of breath. Current antihyperlipidemic treatment includes statins. The current treatment provides significant improvement of lipids. Compliance problems include adherence to diet and adherence to exercise.  Risk factors for coronary artery disease include diabetes mellitus, dyslipidemia, hypertension and obesity.  Arthritis Presents for follow-up visit. She reports no pain or joint swelling. Affected locations include the left shoulder, left knee and right knee. Compliance with medications is 76-100%.   Lab Results  Component  Value Date   CREATININE 1.2 (A) 07/21/2020   BUN 27 (A) 07/21/2020   NA 142 07/21/2020   K 4.1 07/21/2020   CL 100 05/14/2020   CO2 22 05/14/2020   Lab Results  Component Value Date   CHOL 142 07/21/2020   HDL 52 07/21/2020   LDLCALC 72 07/21/2020   TRIG 86 07/21/2020   CHOLHDL 3.1 09/24/2019   Lab Results  Component Value Date   TSH 1.00 07/21/2020   Lab Results  Component Value Date   HGBA1C 7.7 06/30/2020   Lab Results  Component Value Date   WBC 7.4 08/29/2017   HGB 10.7 (L) 08/29/2017   HCT 32.6 (L) 08/29/2017   MCV 88.6 08/29/2017   PLT 284 08/29/2017   Lab Results  Component Value Date   ALT 9 07/21/2020   AST 19 07/21/2020   ALKPHOS 67 09/24/2019   BILITOT 0.4 09/24/2019     Review of Systems  Constitutional:  Negative for malaise/fatigue.  Eyes:  Negative for blurred vision.  Respiratory:  Negative for shortness of breath.   Cardiovascular:  Negative for chest pain, palpitations and orthopnea.  Musculoskeletal:  Positive for arthritis. Negative for joint swelling, myalgias and neck pain.  Neurological:  Negative for focal weakness and headaches.   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  Current Meds  Medication Sig   aspirin 81 MG tablet Take 1 tablet by mouth daily.   diltiazem (DILT-XR) 240 MG 24 hr capsule TAKE 1 CAPSULE EVERY DAY   glucose blood (ACCU-CHEK AVIVA PLUS) test strip USE TWICE DAILY AS DIRECTED   lisinopril-hydrochlorothiazide (ZESTORETIC) 20-12.5 MG tablet TAKE 1 TABLET EVERY DAY   meloxicam (MOBIC) 7.5 MG tablet Take 1 tablet (7.5 mg total) by mouth daily.   metFORMIN (GLUCOPHAGE) 500 MG tablet TAKE 1 TABLET TWICE DAILY (Patient taking differently: 500 mg. TAKE 1 TABLETin morning and 2 at night/ Dr Honor Junes)   simvastatin (ZOCOR) 20 MG tablet TAKE 1 TABLET EVERY DAY   tiZANidine (ZANAFLEX) 4 MG tablet TAKE 1 TABLET(4 MG) BY MOUTH AT BEDTIME   Current Facility-Administered Medications for the 11/05/20 encounter (Office Visit) with Juline Patch, MD  Medication   albuterol (PROVENTIL) (2.5 MG/3ML) 0.083% nebulizer solution 2.5 mg    PHQ 2/9 Scores 07/09/2020 05/14/2020 09/24/2019 07/16/2019  PHQ - 2 Score 0 0 0 0  PHQ- 9 Score - 0 0 0    GAD 7 : Generalized Anxiety Score 05/14/2020 09/24/2019 07/16/2019 04/10/2019  Nervous, Anxious, on Edge 0 0 0 0  Control/stop worrying 0 0 0 0  Worry too much - different things 0 0 0 0  Trouble relaxing 0 0 0 0  Restless 0 0 0 0  Easily annoyed or irritable 0 0 0 0  Afraid - awful might happen 0 0 0 0  Total GAD 7 Score 0 0 0 0  Anxiety Difficulty - Not difficult at all - -    BP Readings from Last 3 Encounters:  11/05/20 138/60  05/14/20 138/70  09/24/19 138/64    Physical Exam  Wt Readings from Last 3 Encounters:  11/05/20 167 lb (75.8 kg)  05/14/20 164 lb (74.4 kg)  09/24/19 169 lb (76.7 kg)    BP 138/60   Pulse 68   Ht 5\' 6"  (1.676 m)   Wt 167 lb (75.8 kg)   BMI 26.95 kg/m   Assessment and Plan:  1. Essential hypertension Chronic.  Controlled.  Stable.  Blood pressure today is 138/60.  Continue diltiazem 1  capsule to 40 mg daily and lisinopril hydrochlorothiazide 20-12 0.51 a day as well.  Review of previous renal function panel is acceptable and we will repeat in 6 months - diltiazem (DILT-XR) 240 MG 24 hr capsule; TAKE 1 CAPSULE EVERY DAY  Dispense: 90 capsule; Refill: 1 - lisinopril-hydrochlorothiazide (ZESTORETIC) 20-12.5 MG tablet; Take 1 tablet by mouth daily.  Dispense: 90 tablet; Refill: 1  2. Familial multiple lipoprotein-type hyperlipidemia Chronic.  Controlled.  Stable.  Continue simvastatin 20 mg once a day.  Review of lipid panel is acceptable. - simvastatin (ZOCOR) 20 MG tablet; Take 1 tablet (20 mg total) by mouth daily.  Dispense: 90 tablet; Refill: 1  3. Primary osteoarthritis of knee, unspecified laterality Chronic.  Controlled.  Stable.  Patient is having adequate relief with meloxicam 7.5 once a day and tizanidine at night. - meloxicam (MOBIC) 7.5 MG tablet; Take 1 tablet (7.5 mg total) by mouth daily.  Dispense:  90 tablet; Refill: 1 - tiZANidine (ZANAFLEX) 4 MG tablet; TAKE 1 TABLET(4 MG) BY MOUTH AT BEDTIME  Dispense: 90 tablet; Refill: 1

## 2020-12-08 ENCOUNTER — Other Ambulatory Visit: Payer: Self-pay

## 2020-12-08 ENCOUNTER — Telehealth: Payer: Self-pay

## 2020-12-08 DIAGNOSIS — Z1231 Encounter for screening mammogram for malignant neoplasm of breast: Secondary | ICD-10-CM

## 2020-12-08 NOTE — Telephone Encounter (Signed)
Copied from [place] [phone]. Topic: Referral - Request for Referral >> Dec 08, 2020  8:29 AM Celene Kras wrote: Has patient seen PCP for this complaint? Yes.   *If NO, is insurance requiring patient see PCP for this issue before PCP can refer them? Referral for which specialty: Mammography Preferred provider/office: [place] office Reason for referral: Pt needing her yearly mammogram. Please advise.

## 2020-12-08 NOTE — Telephone Encounter (Signed)
Called pt with mammo appt in [place] on Dec. 7th @ 10:30- LM on machine/ cell phone

## 2020-12-10 NOTE — Telephone Encounter (Signed)
Pt called in about the appt date, and wanted to let Baxter Flattery know she is aware of that, and to say thank you.

## 2021-01-14 ENCOUNTER — Other Ambulatory Visit: Payer: Self-pay

## 2021-01-14 ENCOUNTER — Ambulatory Visit
Admission: RE | Admit: 2021-01-14 | Discharge: 2021-01-14 | Disposition: A | Discharge status: Home / Self Care | Payer: Medicare HMO | Source: Ambulatory Visit | Attending: Family Medicine | Admitting: Family Medicine

## 2021-01-14 DIAGNOSIS — Z1231 Encounter for screening mammogram for malignant neoplasm of breast: Secondary | ICD-10-CM | POA: Insufficient documentation

## 2021-01-19 DIAGNOSIS — E785 Hyperlipidemia, unspecified: Secondary | ICD-10-CM | POA: Diagnosis not present

## 2021-01-19 DIAGNOSIS — E1129 Type 2 diabetes mellitus with other diabetic kidney complication: Secondary | ICD-10-CM | POA: Diagnosis not present

## 2021-01-19 DIAGNOSIS — E1159 Type 2 diabetes mellitus with other circulatory complications: Secondary | ICD-10-CM | POA: Diagnosis not present

## 2021-01-19 DIAGNOSIS — R809 Proteinuria, unspecified: Secondary | ICD-10-CM | POA: Diagnosis not present

## 2021-01-19 DIAGNOSIS — I152 Hypertension secondary to endocrine disorders: Secondary | ICD-10-CM | POA: Diagnosis not present

## 2021-01-19 DIAGNOSIS — E1169 Type 2 diabetes mellitus with other specified complication: Secondary | ICD-10-CM | POA: Diagnosis not present

## 2021-01-19 LAB — HEMOGLOBIN A1C: Hemoglobin A1C: 7.8

## 2021-01-19 LAB — HM DIABETES FOOT EXAM: HM Diabetic Foot Exam: NORMAL

## 2021-04-07 ENCOUNTER — Other Ambulatory Visit: Payer: Self-pay | Admitting: Family Medicine

## 2021-04-07 DIAGNOSIS — M1710 Unilateral primary osteoarthritis, unspecified knee: Secondary | ICD-10-CM

## 2021-05-13 ENCOUNTER — Other Ambulatory Visit: Payer: Self-pay | Admitting: Family Medicine

## 2021-05-13 DIAGNOSIS — E7849 Other hyperlipidemia: Secondary | ICD-10-CM

## 2021-05-13 DIAGNOSIS — I10 Essential (primary) hypertension: Secondary | ICD-10-CM

## 2021-05-13 DIAGNOSIS — M1710 Unilateral primary osteoarthritis, unspecified knee: Secondary | ICD-10-CM

## 2021-05-25 ENCOUNTER — Other Ambulatory Visit: Payer: Self-pay | Admitting: Family Medicine

## 2021-05-25 DIAGNOSIS — M1710 Unilateral primary osteoarthritis, unspecified knee: Secondary | ICD-10-CM

## 2021-05-27 ENCOUNTER — Other Ambulatory Visit: Payer: Self-pay | Admitting: Family Medicine

## 2021-05-27 DIAGNOSIS — I10 Essential (primary) hypertension: Secondary | ICD-10-CM

## 2021-05-29 ENCOUNTER — Telehealth: Payer: Self-pay | Admitting: Family Medicine

## 2021-05-29 ENCOUNTER — Encounter: Payer: Medicare HMO | Admitting: Family Medicine

## 2021-05-29 NOTE — Telephone Encounter (Signed)
Copied from [place] [phone]. Topic: General - Other ?>> May 29, 2021  9:24 AM Yvette Rack wrote: ?Reason for CRM: Pt daughter Lilyan Punt stated she needed to make Baxter Flattery aware that pt is on a field trip with the school so she probably forgot about her appt. ?

## 2021-06-01 ENCOUNTER — Ambulatory Visit (INDEPENDENT_AMBULATORY_CARE_PROVIDER_SITE_OTHER): Payer: Medicare HMO | Admitting: Family Medicine

## 2021-06-01 ENCOUNTER — Encounter: Payer: Self-pay | Admitting: Family Medicine

## 2021-06-01 VITALS — BP 138/60 | HR 64 | Ht 66.0 in | Wt 166.0 lb

## 2021-06-01 DIAGNOSIS — I10 Essential (primary) hypertension: Secondary | ICD-10-CM | POA: Diagnosis not present

## 2021-06-01 DIAGNOSIS — M1710 Unilateral primary osteoarthritis, unspecified knee: Secondary | ICD-10-CM

## 2021-06-01 DIAGNOSIS — E7849 Other hyperlipidemia: Secondary | ICD-10-CM | POA: Diagnosis not present

## 2021-06-01 MED ORDER — DILTIAZEM HCL ER 240 MG PO CP24: 240.0000 mg | ORAL_CAPSULE | Freq: Every day | ORAL | 1 refills | Status: DC

## 2021-06-01 MED ORDER — LISINOPRIL-HYDROCHLOROTHIAZIDE 20-12.5 MG PO TABS: 1.0000 | ORAL_TABLET | Freq: Every day | ORAL | 1 refills | Status: DC

## 2021-06-01 MED ORDER — SIMVASTATIN 20 MG PO TABS: 20.0000 mg | ORAL_TABLET | Freq: Every day | ORAL | 1 refills | Status: DC

## 2021-06-01 NOTE — Progress Notes (Signed)
error 

## 2021-06-01 NOTE — Progress Notes (Signed)
? ? ?Date:  06/01/2021  ? ?Name:  April [name]   DOB:  03-11-1940   MRN:  [id number] ? ? ?Chief Complaint: Hyperlipidemia and Hypertension ? ?Hyperlipidemia ?This is a chronic problem. The current episode started more than 1 year ago. The problem is controlled. Recent lipid tests were reviewed and are normal. She has no history of chronic renal disease, diabetes, hypothyroidism, liver disease, obesity or nephrotic syndrome. There are no known factors aggravating her hyperlipidemia. Pertinent negatives include no chest pain, focal sensory loss, focal weakness, leg pain, myalgias or shortness of breath. Current antihyperlipidemic treatment includes statins. The current treatment provides moderate improvement of lipids. There are no compliance problems.  Risk factors for coronary artery disease include dyslipidemia and hypertension.  ?Hypertension ?This is a chronic problem. The current episode started more than 1 year ago. The problem has been gradually improving since onset. The problem is controlled. Pertinent negatives include no anxiety, blurred vision, chest pain, headaches, malaise/fatigue, neck pain, orthopnea, palpitations, peripheral edema, PND, shortness of breath or sweats. There are no associated agents to hypertension. Risk factors for coronary artery disease include dyslipidemia. Past treatments include ACE inhibitors, diuretics and calcium channel blockers. The current treatment provides moderate improvement. There are no compliance problems.  There is no history of angina, kidney disease, CAD/MI, CVA, heart failure, left ventricular hypertrophy, PVD or retinopathy. There is no history of chronic renal disease, a hypertension causing med or renovascular disease.  ? ?Lab Results  ?Component Value Date  ? NA 142 07/21/2020  ? K 4.1 07/21/2020  ? CO2 22 05/14/2020  ? GLUCOSE 123 (H) 05/14/2020  ? BUN 27 (A) 07/21/2020  ? CREATININE 1.2 (A) 07/21/2020  ? CALCIUM 10.0 05/14/2020  ? EGFR 40 (L) 05/14/2020  ?  GFRNONAA 51 (L) 09/24/2019  ? ?Lab Results  ?Component Value Date  ? CHOL 142 07/21/2020  ? HDL 52 07/21/2020  ? [place] 72 07/21/2020  ? TRIG 86 07/21/2020  ? CHOLHDL 3.1 09/24/2019  ? ?Lab Results  ?Component Value Date  ? TSH 1.00 07/21/2020  ? ?Lab Results  ?Component Value Date  ? HGBA1C 7.8 01/19/2021  ? ?Lab Results  ?Component Value Date  ? WBC 7.4 08/29/2017  ? HGB 10.7 (L) 08/29/2017  ? HCT 32.6 (L) 08/29/2017  ? MCV 88.6 08/29/2017  ? PLT 284 08/29/2017  ? ?Lab Results  ?Component Value Date  ? ALT 9 07/21/2020  ? AST 19 07/21/2020  ? ALKPHOS 67 09/24/2019  ? BILITOT 0.4 09/24/2019  ? ?No results found for: 25OHVITD2, [place], VD25OH  ? ?Review of Systems  ?Constitutional: Negative.  Negative for chills, fatigue, fever, malaise/fatigue and unexpected weight change.  ?HENT:  Negative for congestion, ear discharge, ear pain, rhinorrhea, sinus pressure, sneezing and sore throat.   ?Eyes:  Negative for blurred vision.  ?Respiratory:  Negative for cough, shortness of breath, wheezing and stridor.   ?Cardiovascular:  Negative for chest pain, palpitations, orthopnea and PND.  ?Gastrointestinal:  Negative for abdominal pain, blood in stool, constipation, diarrhea and nausea.  ?Genitourinary:  Negative for dysuria, flank pain, frequency, hematuria, urgency and vaginal discharge.  ?Musculoskeletal:  Negative for arthralgias, back pain, myalgias and neck pain.  ?Skin:  Negative for rash.  ?Neurological:  Negative for dizziness, focal weakness, weakness and headaches.  ?Hematological:  Negative for adenopathy. Does not bruise/bleed easily.  ?Psychiatric/Behavioral:  Negative for dysphoric mood. The patient is not nervous/anxious.   ? ?Patient Active Problem List  ? Diagnosis Date Noted  ?  Elevated transaminase level 04/10/2019  ? Primary osteoarthritis of knee 10/06/2016  ? Type 2 diabetes mellitus without complications ([place]) 21/30/8657  ? 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 Alimentary  obesity 06/20/2014  ? Essential hypertension 06/20/2014  ? Routine sports examination 06/20/2014  ? Health examination of defined subpopulation 06/20/2014  ? Hyperlipidemia due to type 2 diabetes mellitus ([place]) 06/20/2014  ? ? ?No Known Allergies ? ?Past Surgical History:  ?Procedure Laterality Date  ? APPENDECTOMY    ? gallstones  06/12/2009  ? ? ?Social History  ? ?Tobacco Use  ? Smoking status: Former  ? Smokeless tobacco: Never  ?Vaping Use  ? Vaping Use: Never used  ?Substance Use Topics  ? Alcohol use: No  ?  Alcohol/week: 0.0 standard drinks  ? Drug use: No  ? ? ? ?Medication list has been reviewed and updated. ? ?Current Meds  ?Medication Sig  ? aspirin 81 MG tablet Take 1 tablet by mouth daily.  ? diltiazem (DILACOR XR) 240 MG 24 hr capsule TAKE 1 CAPSULE EVERY DAY  ? glucose blood (ACCU-CHEK AVIVA PLUS) test strip USE TWICE DAILY AS DIRECTED  ? lisinopril-hydrochlorothiazide (ZESTORETIC) 20-12.5 MG tablet TAKE 1 TABLET EVERY DAY  ? metFORMIN (GLUCOPHAGE) 500 MG tablet TAKE 1 TABLET TWICE DAILY (Patient taking differently: 500 mg. TAKE 1 TABLETin morning and 2 at night/ Dr Honor Junes)  ? simvastatin (ZOCOR) 20 MG tablet TAKE 1 TABLET EVERY DAY  ? tiZANidine (ZANAFLEX) 4 MG tablet TAKE 1 TABLET AT BEDTIME  ? [DISCONTINUED] meloxicam (MOBIC) 7.5 MG tablet Take 1 tablet (7.5 mg total) by mouth daily.  ? ?Current Facility-Administered Medications for the 06/01/21 encounter (Office Visit) with Juline Patch, MD  ?Medication  ? albuterol (PROVENTIL) (2.5 MG/3ML) 0.083% nebulizer solution 2.5 mg  ? ? ? ?  06/01/2021  ? 10:19 AM 05/14/2020  ? 11:29 AM 09/24/2019  ?  9:04 AM 07/16/2019  ?  4:00 PM  ?GAD 7 : Generalized Anxiety Score  ?Nervous, Anxious, on Edge 0 0 0 0  ?Control/stop worrying 0 0 0 0  ?Worry too much - different things 0 0 0 0  ?Trouble relaxing 0 0 0 0  ?Restless 0 0 0 0  ?Easily annoyed or irritable 0 0 0 0  ?Afraid - awful might happen 0 0 0 0  ?Total GAD 7 Score 0 0 0 0  ?Anxiety Difficulty Not difficult  at all  Not difficult at all   ? ? ? ?  06/01/2021  ? 10:19 AM  ?Depression screen PHQ 2/9  ?Decreased Interest 0  ?Down, Depressed, Hopeless 0  ?PHQ - 2 Score 0  ?Altered sleeping 0  ?Tired, decreased energy 0  ?Change in appetite 0  ?Feeling bad or failure about yourself  0  ?Trouble concentrating 0  ?Moving slowly or fidgety/restless 0  ?Suicidal thoughts 0  ?PHQ-9 Score 0  ?Difficult doing work/chores Not difficult at all  ? ? ?BP Readings from Last 3 Encounters:  ?06/01/21 138/60  ?11/05/20 138/60  ?05/14/20 138/70  ? ? ?Physical Exam ?Vitals and nursing note reviewed. Exam conducted with a chaperone present.  ?Constitutional:   ?   General: She is not in acute distress. ?   Appearance: She is not diaphoretic.  ?HENT:  ?   Head: Normocephalic and atraumatic.  ?   Right Ear: Tympanic membrane and external ear normal.  ?   Left Ear: Tympanic membrane and external ear normal.  ?   Nose: Nose normal. No congestion  or rhinorrhea.  ?Eyes:  ?   General:     ?   Right eye: No discharge.     ?   Left eye: No discharge.  ?   Conjunctiva/sclera: Conjunctivae normal.  ?   Pupils: Pupils are equal, round, and reactive to light.  ?Neck:  ?   Thyroid: No thyromegaly.  ?   Vascular: No JVD.  ?Cardiovascular:  ?   Rate and Rhythm: Normal rate and regular rhythm.  ?   Heart sounds: Normal heart sounds. No murmur heard. ?  No friction rub. No gallop.  ?Pulmonary:  ?   Effort: Pulmonary effort is normal.  ?   Breath sounds: Normal breath sounds. No wheezing or rhonchi.  ?Abdominal:  ?   General: Bowel sounds are normal.  ?   Palpations: Abdomen is soft. There is no mass.  ?   Tenderness: There is no abdominal tenderness. There is no guarding.  ?Musculoskeletal:     ?   General: Normal range of motion.  ?   Cervical back: Normal range of motion and neck supple.  ?Lymphadenopathy:  ?   Cervical: No cervical adenopathy.  ?Skin: ?   General: Skin is warm and dry.  ?Neurological:  ?   Mental Status: She is alert.  ?   Deep Tendon  Reflexes: Reflexes are normal and symmetric.  ? ? ?Wt Readings from Last 3 Encounters:  ?06/01/21 166 lb (75.3 kg)  ?11/05/20 167 lb (75.8 kg)  ?05/14/20 164 lb (74.4 kg)  ? ? ?BP 138/60   Pulse 64   Ht 5' 6

## 2021-06-19 ENCOUNTER — Ambulatory Visit: Payer: Self-pay

## 2021-06-19 ENCOUNTER — Telehealth: Payer: Self-pay | Admitting: Family Medicine

## 2021-06-19 NOTE — Telephone Encounter (Signed)
Called pt left VM to call back. No available appt today. Can not send in medication due to Dr. Ronnald Ramp not sending it in since 08/2021. Pt can go to UC for medication. ? ?[facility] nurse may give results to patient if they return call to clinic, a CRM has been created. ? ? ?KP ?

## 2021-06-19 NOTE — Telephone Encounter (Signed)
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 Submitted a clinical call since patient has symptoms  ? ?Has the patient contacted their pharmacy? Yes.   ?(Agent: If no, request that the patient contact the pharmacy for the refill. If patient does not wish to contact the pharmacy document the reason why and proceed with request.) ?(Agent: If yes, when and what did the pharmacy advise?) ? ?Preferred Pharmacy (with phone number or street name):  ?[facility], [place] [address]  ?[place] [address]  ?Phone: [phone] Fax: [phone]  ? ?Has the patient been seen for an appointment in the last year OR does the patient have an upcoming appointment? Yes.   ? ?Agent: Please be advised that RX refills may take up to 3 business days. We ask that you follow-up with your pharmacy. ? ?

## 2021-06-19 NOTE — Telephone Encounter (Signed)
?  Chief Complaint: gout ?Symptoms: L foot pain 10/10, swelling minimal  ?Frequency: 2 days ?Pertinent Negatives: NA ?Disposition: '[]'$ ED /'[]'$ Urgent Care (no appt availability in office) / '[]'$ Appointment(In office/virtual)/ '[]'$  [facility]/ '[]'$ Home Care/ '[]'$ Refused Recommended Disposition /'[]'$ [facility]/ '[x]'$  Follow-up with PCP ?Additional Notes: pt requesting colchicine be sent in for gout flare up. No appts today available and PCP is off. Advised pt I will send message back and see if one of the other providers can send this in for her. Pt needs rx to go to [facility].  ? ?Summary: Gout medication with symptoms  ? 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 Says she is experiencing pain currently  ? ?Has the patient contacted their pharmacy? Yes.    ?(Agent: If no, request that the patient contact the pharmacy for the refill. If patient does not wish to contact the pharmacy document the reason why and proceed with request.)  ?(Agent: If yes, when and what did the pharmacy advise?)  ? ?Preferred Pharmacy (with phone number or street name):  ?[facility], [place] [address] [place]  ?[place] [address]  ?Phone: [phone] Fax: [phone]  ? ?Has the patient been seen for an appointment in the last year OR does the patient have an upcoming appointment? Yes.    ? ?Agent: Please be advised that RX refills may take up to 3 business days. We ask that you follow-up with your pharmacy.   ?  ? ?Reason for Disposition ? [1] SEVERE pain (e.g., excruciating, unable to do any normal activities) AND [2] not improved after 2 hours of pain medicine ? ?Answer Assessment - Initial Assessment Questions ?1. ONSET: "When did the pain start?"  ?    2 days  ?2. LOCATION: "Where is the pain located?"  ?    L foot  ?3. PAIN: "How bad is the pain?"    (Scale 1-10; or mild, moderate, severe) ? - MILD  (1-3): doesn't interfere with normal activities.  ? - MODERATE (4-7): interferes with normal activities (e.g., work or school) or awakens from sleep, limping.  ? - SEVERE (8-10): excruciating pain, unable to do any normal activities, unable to walk.  ?    10 ?5. CAUSE: "What do you think is causing the foot pain?" ?    Gout  ?6. OTHER SYMPTOMS: "Do you have any other symptoms?" (e.g., leg pain, rash, fever, numbness) ?    Swelling ? ?Protocols used: Foot Pain-A-AH ? ?

## 2021-06-19 NOTE — Telephone Encounter (Signed)
See triage encounter.

## 2021-06-19 NOTE — Telephone Encounter (Addendum)
Pt called back, advised of CMA message and advised pt she could go to UC or if willing to set up mychart could do a virtual appt. Pt refused. I offered to see if Dr. Ronnald Ramp has any OV on 06/22/21 but pt refused that as well and state she will see if she can take something OTC. I advised her to try OTC Naproxen for the pain and swelling and ice as well. Also advised if no better by Monday 06/22/21 to call and schedule an appt. Pt verbalized understanding.  ?

## 2021-06-19 NOTE — Telephone Encounter (Signed)
Noted  KP 

## 2021-06-22 ENCOUNTER — Telehealth: Payer: Self-pay

## 2021-06-22 NOTE — Telephone Encounter (Signed)
Copied from [place] [id number]. Topic: Quick Communication - See Telephone Encounter ?>> Jun 22, 2021  8:47 AM Elenora Fender, Kristeen Miss A, [place] wrote: ?CRM for notification. See Telephone encounter for: 06/22/21. ?>> Jun 22, 2021  8:50 AM Elenora Fender, Kristeen Miss A, [place] wrote: ?Patient stated that she needs something called in for Gout. She has had a flare since Friday. She stated that it is hard to walk and would like something called into [facility] ([place]).  ?

## 2021-06-22 NOTE — Telephone Encounter (Signed)
Please call pt to schedule an appt to be seen for gout. Pt needs to be seen before medication can be sent in. Hasnt been filled since 2020.  ? ?KP

## 2021-06-23 ENCOUNTER — Encounter: Payer: Self-pay | Admitting: Family Medicine

## 2021-06-23 ENCOUNTER — Ambulatory Visit (INDEPENDENT_AMBULATORY_CARE_PROVIDER_SITE_OTHER): Payer: Medicare HMO | Admitting: Family Medicine

## 2021-06-23 VITALS — BP 178/64 | HR 87 | Ht 66.0 in | Wt 165.0 lb

## 2021-06-23 DIAGNOSIS — M109 Gout, unspecified: Secondary | ICD-10-CM

## 2021-06-23 DIAGNOSIS — R809 Proteinuria, unspecified: Secondary | ICD-10-CM | POA: Diagnosis not present

## 2021-06-23 DIAGNOSIS — E1129 Type 2 diabetes mellitus with other diabetic kidney complication: Secondary | ICD-10-CM | POA: Diagnosis not present

## 2021-06-23 LAB — HEMOGLOBIN A1C: Hemoglobin A1C: 7.7

## 2021-06-23 MED ORDER — COLCHICINE 0.6 MG PO TABS: 0.6000 mg | ORAL_TABLET | Freq: Every day | ORAL | 0 refills | Status: DC

## 2021-06-23 NOTE — Progress Notes (Signed)
? ? ?Date:  06/23/2021  ? ?Name:  April [name]   DOB:  December 23, 1940   MRN:  [id number] ? ? ?Chief Complaint: Foot Pain (L) foot pain- ankle is swollen and hurts across top of foot- started Friday. No known injury) ? ?Foot Pain ?This is a new problem. The current episode started in the past 7 days. The problem occurs constantly. Pertinent negatives include no arthralgias, joint swelling, myalgias or neck pain. She has tried nothing for the symptoms. The treatment provided moderate relief.  ? ?Lab Results  ?Component Value Date  ? NA 142 07/21/2020  ? K 4.1 07/21/2020  ? CO2 22 05/14/2020  ? GLUCOSE 123 (H) 05/14/2020  ? BUN 27 (A) 07/21/2020  ? CREATININE 1.2 (A) 07/21/2020  ? CALCIUM 10.0 05/14/2020  ? EGFR 40 (L) 05/14/2020  ? GFRNONAA 51 (L) 09/24/2019  ? ?Lab Results  ?Component Value Date  ? CHOL 142 07/21/2020  ? HDL 52 07/21/2020  ? [place] 72 07/21/2020  ? TRIG 86 07/21/2020  ? CHOLHDL 3.1 09/24/2019  ? ?Lab Results  ?Component Value Date  ? TSH 1.00 07/21/2020  ? ?Lab Results  ?Component Value Date  ? HGBA1C 7.8 01/19/2021  ? ?Lab Results  ?Component Value Date  ? WBC 7.4 08/29/2017  ? HGB 10.7 (L) 08/29/2017  ? HCT 32.6 (L) 08/29/2017  ? MCV 88.6 08/29/2017  ? PLT 284 08/29/2017  ? ?Lab Results  ?Component Value Date  ? ALT 9 07/21/2020  ? AST 19 07/21/2020  ? ALKPHOS 67 09/24/2019  ? BILITOT 0.4 09/24/2019  ? ?No results found for: 25OHVITD2, [place], VD25OH  ? ?Review of Systems  ?Musculoskeletal:  Negative for arthralgias, back pain, gait problem, joint swelling, myalgias, neck pain and neck stiffness.  ? ?Patient Active Problem List  ? Diagnosis Date Noted  ? Elevated transaminase level 04/10/2019  ? Primary osteoarthritis of knee 10/06/2016  ? 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 Alimentary obesity 06/20/2014  ? Essential hypertension 06/20/2014  ? Routine sports examination 06/20/2014  ? Health examination of  defined subpopulation 06/20/2014  ? Hyperlipidemia due to type 2 diabetes mellitus ([place]) 06/20/2014  ? ? ?No Known Allergies ? ?Past Surgical History:  ?Procedure Laterality Date  ? APPENDECTOMY    ? gallstones  06/12/2009  ? ? ?Social History  ? ?Tobacco Use  ? Smoking status: Former  ? Smokeless tobacco: Never  ?Vaping Use  ? Vaping Use: Never used  ?Substance Use Topics  ? Alcohol use: No  ?  Alcohol/week: 0.0 standard drinks  ? Drug use: No  ? ? ? ?Medication list has been reviewed and updated. ? ?No outpatient medications have been marked as taking for the 06/23/21 encounter (Office Visit) with Juline Patch, MD.  ? ?Current Facility-Administered Medications for the 06/23/21 encounter (Office Visit) with Juline Patch, MD  ?Medication  ? albuterol (PROVENTIL) (2.5 MG/3ML) 0.083% nebulizer solution 2.5 mg  ? ? ? ?  06/23/2021  ? 11:37 AM 06/01/2021  ? 10:19 AM 05/14/2020  ? 11:29 AM 09/24/2019  ?  9:04 AM  ?GAD 7 : Generalized Anxiety Score  ?Nervous, Anxious, on Edge 0 0 0 0  ?Control/stop worrying 0 0 0 0  ?Worry too much - different things 0 0 0 0  ?Trouble relaxing 0 0 0 0  ?Restless 0 0 0 0  ?Easily annoyed or irritable 0 0 0 0  ?Afraid - awful might happen  0 0 0 0  ?Total GAD 7 Score 0 0 0 0  ?Anxiety Difficulty Not difficult at all Not difficult at all  Not difficult at all  ? ? ? ?  06/23/2021  ? 11:37 AM  ?Depression screen PHQ 2/9  ?Decreased Interest 0  ?Down, Depressed, Hopeless 0  ?PHQ - 2 Score 0  ?Altered sleeping 0  ?Tired, decreased energy 0  ?Change in appetite 0  ?Feeling bad or failure about yourself  0  ?Trouble concentrating 0  ?Moving slowly or fidgety/restless 0  ?Suicidal thoughts 0  ?PHQ-9 Score 0  ?Difficult doing work/chores Not difficult at all  ? ? ?BP Readings from Last 3 Encounters:  ?06/23/21 (!) 178/64  ?06/01/21 138/60  ?11/05/20 138/60  ? ? ?Physical Exam ?Vitals and nursing note reviewed.  ?Musculoskeletal:  ?   Left foot: Normal range of motion. Tenderness present.  ?   Comments:  Tender across dorsum left foot  ?Feet:  ?   Left foot:  ?   Skin integrity: No ulcer, erythema or warmth.  ? ? ?Wt Readings from Last 3 Encounters:  ?06/23/21 165 lb (74.8 kg)  ?06/01/21 166 lb (75.3 kg)  ?11/05/20 167 lb (75.8 kg)  ? ? ?BP (!) 178/64   Pulse 87   Ht 5' 6"  (1.676 m)   Wt 165 lb (74.8 kg)   SpO2 97%   BMI 26.63 kg/m?  ? ?Assessment and Plan: ? ?1. Acute gout of left foot, unspecified cause ?New onset.  Persistent.  Uncontrolled.  Pain is across the dorsum of the left foot but is palpable on the plantar aspect as well this is either a tenosynovitis versus her insistence of gout.  Because she ate red meat she is convinced that it is indeed acute gout and I will refill her colchicine 0.6 may take 1 a day may repeat in 12 hours if necessary.  If pain continues I would suggest that the patient return we may need to x-ray and put on an anti-inflammatory. ?- colchicine 0.6 MG tablet; Take 1 tablet (0.6 mg total) by mouth daily.  Dispense: 10 tablet; Refill: 0  ? ? ?

## 2021-07-13 ENCOUNTER — Ambulatory Visit (INDEPENDENT_AMBULATORY_CARE_PROVIDER_SITE_OTHER): Payer: Medicare HMO

## 2021-07-13 DIAGNOSIS — Z0000 Encounter for general adult medical examination without abnormal findings: Secondary | ICD-10-CM

## 2021-07-13 NOTE — Progress Notes (Signed)
Subjective:   April [name] is a 81 y.o. female who presents for Medicare Annual (Subsequent) preventive examination.  Virtual Visit via Telephone Note  I connected with  April [name] on 07/13/21 at  8:45 AM EDT by telephone and verified that I am speaking with the correct person using two identifiers.  Location: Patient: home Provider: [facility] Persons participating in the virtual visit: [place]   I discussed the limitations, risks, security and privacy concerns of performing an evaluation and management service by telephone and the availability of in person appointments. The patient expressed understanding and agreed to proceed.  Interactive audio and video telecommunications were attempted between this nurse and patient, however failed, due to patient having technical difficulties OR patient did not have access to video capability.  We continued and completed visit with audio only.  Some vital signs may be absent or patient reported.   Clemetine Marker, LPN   Review of Systems     Cardiac Risk Factors include: advanced age (>68mn, >>39women);diabetes mellitus;dyslipidemia;hypertension     Objective:    There were no vitals filed for this visit. There is no height or weight on file to calculate BMI.     07/13/2021    8:55 AM 07/09/2020    9:39 AM 07/04/2019    9:33 AM 02/11/2018   12:38 PM 08/29/2017   12:17 PM 12/26/2016    8:21 AM 06/10/2016    6:44 PM  Advanced Directives  Does Patient Have a Medical Advance Directive? No Yes No No No No No  Type of ASocial research officer, governmentLiving will       [place] of [place]?  No - copy requested       Would patient like information on creating a medical advance directive? No - Patient declined  Yes (MAU/Ambulatory/Procedural Areas - Information given)  No - Patient declined Yes (ED - Information included in AVS)     Current Medications (verified) Outpatient Encounter  Medications as of 07/13/2021  Medication Sig   aspirin 81 MG tablet Take 1 tablet by mouth daily.   colchicine 0.6 MG tablet Take 1 tablet (0.6 mg total) by mouth daily.   diltiazem (DILACOR XR) 240 MG 24 hr capsule Take 1 capsule (240 mg total) by mouth daily.   glucose blood (ACCU-CHEK AVIVA PLUS) test strip USE TWICE DAILY AS DIRECTED   lisinopril-hydrochlorothiazide (ZESTORETIC) 20-12.5 MG tablet Take 1 tablet by mouth daily.   metFORMIN (GLUCOPHAGE) 500 MG tablet TAKE 1 TABLET TWICE DAILY (Patient taking differently: 500 mg. TAKE 1 TABLETin morning and 2 at night/ Dr OHonor Junes   simvastatin (ZOCOR) 20 MG tablet Take 1 tablet (20 mg total) by mouth daily.   tiZANidine (ZANAFLEX) 4 MG tablet TAKE 1 TABLET AT BEDTIME   Facility-Administered Encounter Medications as of 07/13/2021  Medication   albuterol (PROVENTIL) (2.5 MG/3ML) 0.083% nebulizer solution 2.5 mg    Allergies (verified) Patient has no known allergies.   History: Past Medical History:  Diagnosis Date   Diabetes mellitus without complication ([place]    Gout    Hyperlipidemia    Hypertension    Past Surgical History:  Procedure Laterality Date   APPENDECTOMY     gallstones  06/12/2009   Family History  Problem Relation Age of Onset   Breast cancer Neg Hx    Social History   Socioeconomic History   Marital status: Widowed    Spouse name: Not on file  Number of children: Not on file   Years of education: Not on file   Highest education level: Not on file  Occupational History   Not on file  Tobacco Use   Smoking status: Former   Smokeless tobacco: Never  Vaping Use   Vaping Use: Never used  Substance and Sexual Activity   Alcohol use: No    Alcohol/week: 0.0 standard drinks   Drug use: No   Sexual activity: Not Currently  Other Topics Concern   Not on file  Social History Narrative   Pt lives with her son and his family   Social Determinants of Health   Financial Resource Strain: Low Risk     Difficulty of Paying Living Expenses: Not hard at all  Food Insecurity: No Food Insecurity   Worried About Charity fundraiser in the Last Year: Never true   Arboriculturist in the Last Year: Never true  Transportation Needs: No Transportation Needs   Lack of Transportation (Medical): No   Lack of Transportation (Non-Medical): No  Physical Activity: Sufficiently Active   Days of Exercise per Week: 5 days   Minutes of Exercise per Session: 30 min  Stress: No Stress Concern Present   Feeling of Stress : Not at all  Social Connections: Moderately Isolated   Frequency of Communication with Friends and Family: More than three times a week   Frequency of Social Gatherings with Friends and Family: More than three times a week   Attends Religious Services: More than 4 times per year   Active Member of [employer] or Organizations: No   Attends Archivist Meetings: Never   Marital Status: Widowed    Tobacco Counseling Counseling given: Not Answered   Clinical Intake:  Pre-visit preparation completed: Yes  Pain : No/denies pain     Nutritional Risks: None Diabetes: Yes CBG done?: No Did pt. bring in CBG monitor from home?: No  How often do you need to have someone help you when you read instructions, pamphlets, or other written materials from your doctor or pharmacy?: 1 - Never  Nutrition Risk Assessment:  Has the patient had any N/V/D within the last 2 months?  No  Does the patient have any non-healing wounds?  No  Has the patient had any unintentional weight loss or weight gain?  No   Diabetes:  Is the patient diabetic?  Yes  If diabetic, was a CBG obtained today?  No  Did the patient bring in their glucometer from home?  No  How often do you monitor your CBG's? Twice daily.   Financial Strains and Diabetes Management:  Are you having any financial strains with the device, your supplies or your medication? No .  Does the patient want to be seen by Chronic Care  Management for management of their diabetes?  No  Would the patient like to be referred to a Nutritionist or for Diabetic Management?  No   Diabetic Exams:  Diabetic Eye Exam: Completed  06/27/20; scheduled for June 2023.   Diabetic Foot Exam: Completed 01/19/21.   Interpreter Needed?: No  Information entered by :: Clemetine Marker LPN   Activities of Daily Living    07/13/2021    8:56 AM  In your present state of health, do you have any difficulty performing the following activities:  Hearing? 0  Vision? 0  Difficulty concentrating or making decisions? 0  Walking or climbing stairs? 0  Dressing or bathing? 0  Doing errands, shopping? 0  Preparing Food and eating ? N  Using the Toilet? N  In the past six months, have you accidently leaked urine? N  Do you have problems with loss of bowel control? N  Managing your Medications? N  Managing your Finances? N  Housekeeping or managing your Housekeeping? N    Patient Care Team: Juline Patch, MD as PCP - General (Family Medicine) Lonia Farber, MD as Consulting Physician (Internal Medicine)  Indicate any recent Medical Services you may have received from other than [facility] providers in the past year (date may be approximate).     Assessment:   This is a routine wellness examination for April [name].  Hearing/Vision screen Hearing Screening - Comments:: Pt denies hearing difficulty Vision Screening - Comments:: Annual vision screenings with Dr. Wyatt Portela at [facility] issues and exercise activities discussed: Current Exercise Habits: Home exercise routine, Type of exercise: walking, Time (Minutes): 30, Frequency (Times/Week): 5, Weekly Exercise (Minutes/Week): 150, Intensity: Mild, Exercise limited by: None identified   Goals Addressed   None    Depression Screen    07/13/2021    8:55 AM 06/23/2021   11:37 AM 06/01/2021   10:19 AM 07/09/2020    9:39 AM 05/14/2020   11:29 AM 09/24/2019    9:04 AM 07/16/2019    4:00 PM  PHQ 2/9  Scores  PHQ - 2 Score 0 0 0 0 0 0 0  PHQ- 9 Score 0 0 0  0 0 0    Fall Risk    07/13/2021    8:56 AM 06/23/2021   11:37 AM 06/23/2021   11:36 AM 07/09/2020    9:40 AM 07/16/2019    3:59 PM  Fall Risk   Falls in the past year? 0 0 0 0 0  Number falls in past yr: 0 0 0 0   Injury with Fall? 0 0 0 0   Risk for fall due to : No Fall Risks No Fall Risks No Fall Risks No Fall Risks   Follow up Falls prevention discussed Falls evaluation completed Falls evaluation completed Falls prevention discussed Falls evaluation completed    FALL RISK PREVENTION PERTAINING TO THE HOME:  Any stairs in or around the home? Yes  If so, are there any without handrails? No  Home free of loose throw rugs in walkways, pet beds, electrical cords, etc? Yes  Adequate lighting in your home to reduce risk of falls? Yes   ASSISTIVE DEVICES UTILIZED TO PREVENT FALLS:  Life alert? No  Use of a cane, walker or w/c? No  Grab bars in the bathroom? No  Shower chair or bench in shower? No  Elevated toilet seat or a handicapped toilet? No   TIMED UP AND GO:  Was the test performed? No . Telephonic visit  Cognitive Function: Normal cognitive status assessed by direct observation by this Nurse Health Advisor. No abnormalities found.          07/04/2019    9:35 AM  6CIT Screen  What Year? 0 points  What month? 0 points  What time? 0 points  Count back from 20 0 points  Months in reverse 0 points  Repeat phrase 0 points  Total Score 0 points    Immunizations Immunization History  Administered Date(s) Administered   Fluad Quad(high Dose 65+) 10/26/2018, 11/27/2019, 11/05/2020   Influenza, High Dose Seasonal PF 10/06/2016   Influenza,inj,Quad PF,6+ Mos 11/02/2012   Influenza-Unspecified 12/09/2017   PFIZER(Purple Top)SARS-COV-2 Vaccination 04/15/2019, 05/07/2019, 10/24/2019   PNEUMOCOCCAL CONJUGATE-20  11/05/2020   Pneumococcal Conjugate-13 10/06/2016   Pneumococcal Polysaccharide-23 06/08/2019   Zoster  Recombinat (Shingrix) 07/16/2020, 09/15/2020    TDAP status: Due, Education has been provided regarding the importance of this vaccine. Advised may receive this vaccine at local pharmacy or Health Dept. Aware to provide a copy of the vaccination record if obtained from local pharmacy or Health Dept. Verbalized acceptance and understanding.  Flu Vaccine status: Up to date  Pneumococcal vaccine status: Up to date  Covid-19 vaccine status: Completed vaccines  Qualifies for Shingles Vaccine? Yes   Zostavax completed No   Shingrix Completed?: Yes  Screening Tests Health Maintenance  Topic Date Due   OPHTHALMOLOGY EXAM  06/27/2021   TETANUS/TDAP  05/30/2022 (Originally 03/13/1959)   HEMOGLOBIN A1C  07/20/2021   INFLUENZA VACCINE  09/08/2021   MAMMOGRAM  01/14/2022   FOOT EXAM  01/19/2022   Pneumonia Vaccine 71+ Years old  Completed   DEXA SCAN  Completed   Zoster Vaccines- Shingrix  Completed   HPV VACCINES  Aged Out   COVID-19 Vaccine  Discontinued    Health Maintenance  Health Maintenance Due  Topic Date Due   OPHTHALMOLOGY EXAM  06/27/2021    Colorectal cancer screening: No longer required.   Mammogram status: Completed 01/14/21. Repeat every year  Bone Density status: Completed 09/27/17. Results reflect: Bone density results: OSTEOPENIA. Repeat every 2 years. Pt declines repeat screening at this time  Lung Cancer Screening: (Low Dose CT Chest recommended if Age 67-80 years, 30 pack-year currently smoking OR have quit w/in 15years.) does not qualify.   Additional Screening:  Hepatitis C Screening: does not qualify  Vision Screening: Recommended annual ophthalmology exams for early detection of glaucoma and other disorders of the eye. Is the patient up to date with their annual eye exam?  Yes  Who is the provider or what is the name of the office in which the patient attends annual eye exams? Dr. Wyatt Portela.   Dental Screening: Recommended annual dental exams for proper oral  hygiene  Community Resource Referral / Chronic Care Management: CRR required this visit?  No   CCM required this visit?  No      Plan:     I have personally reviewed and noted the following in the patient's chart:   Medical and social history Use of alcohol, tobacco or illicit drugs  Current medications and supplements including opioid prescriptions.  Functional ability and status Nutritional status Physical activity Advanced directives List of other physicians Hospitalizations, surgeries, and ER visits in previous 12 months Vitals Screenings to include cognitive, depression, and falls Referrals and appointments  In addition, I have reviewed and discussed with patient certain preventive protocols, quality metrics, and best practice recommendations. A written personalized care plan for preventive services as well as general preventive health recommendations were provided to patient.     Clemetine Marker, LPN   02/11/9700   Nurse Notes: none

## 2021-07-13 NOTE — Patient Instructions (Signed)
April [name] , Thank you for taking time to come for your Medicare Wellness Visit. I appreciate your ongoing commitment to your health goals. Please review the following plan we discussed and let me know if I can assist you in the future.   Screening recommendations/referrals: Colonoscopy: no longer required Mammogram: done 01/14/21 Bone Density: done 09/27/17 Recommended yearly ophthalmology/optometry visit for glaucoma screening and checkup Recommended yearly dental visit for hygiene and checkup  Vaccinations: Influenza vaccine: done 11/05/20 Pneumococcal vaccine: done 11/05/20 Tdap vaccine: due Shingles vaccine: done 07/16/20& 09/15/20   Covid-19:done 04/15/19,05/07/19 & 10/24/19  Advanced directives: Advance directive discussed with you today. Even though you declined this today please call our office should you change your mind and we can give you the proper paperwork for you to fill out.   Conditions/risks identified: Keep up the great work!  Next appointment: Follow up in one year for your annual wellness visit    Preventive Care 65 Years and Older, Female Preventive care refers to lifestyle choices and visits with your health care provider that can promote health and wellness. What does preventive care include? A yearly physical exam. This is also called an annual well check. Dental exams once or twice a year. Routine eye exams. Ask your health care provider how often you should have your eyes checked. Personal lifestyle choices, including: Daily care of your teeth and gums. Regular physical activity. Eating a healthy diet. Avoiding tobacco and drug use. Limiting alcohol use. Practicing safe sex. Taking low-dose aspirin every day. Taking vitamin and mineral supplements as recommended by your health care provider. What happens during an annual well check? The services and screenings done by your health care provider during your annual well check will depend on your age, overall  health, lifestyle risk factors, and family history of disease. Counseling  Your health care provider may ask you questions about your: Alcohol use. Tobacco use. Drug use. Emotional well-being. Home and relationship well-being. Sexual activity. Eating habits. History of falls. Memory and ability to understand (cognition). Work and work Statistician. Reproductive health. Screening  You may have the following tests or measurements: Height, weight, and BMI. Blood pressure. Lipid and cholesterol levels. These may be checked every 5 years, or more frequently if you are over 11 years old. Skin check. Lung cancer screening. You may have this screening every year starting at age 44 if you have a 30-pack-year history of smoking and currently smoke or have quit within the past 15 years. Fecal occult blood test (FOBT) of the stool. You may have this test every year starting at age 33. Flexible sigmoidoscopy or colonoscopy. You may have a sigmoidoscopy every 5 years or a colonoscopy every 10 years starting at age 41. Hepatitis C blood test. Hepatitis B blood test. Sexually transmitted disease (STD) testing. Diabetes screening. This is done by checking your blood sugar (glucose) after you have not eaten for a while (fasting). You may have this done every 1-3 years. Bone density scan. This is done to screen for osteoporosis. You may have this done starting at age 41. Mammogram. This may be done every 1-2 years. Talk to your health care provider about how often you should have regular mammograms. Talk with your health care provider about your test results, treatment options, and if necessary, the need for more tests. Vaccines  Your health care provider may recommend certain vaccines, such as: Influenza vaccine. This is recommended every year. Tetanus, diphtheria, and acellular pertussis (Tdap, Td) vaccine. You may need a  Td booster every 10 years. Zoster vaccine. You may need this after age  90. Pneumococcal 13-valent conjugate (PCV13) vaccine. One dose is recommended after age 16. Pneumococcal polysaccharide (PPSV23) vaccine. One dose is recommended after age 69. Talk to your health care provider about which screenings and vaccines you need and how often you need them. This information is not intended to replace advice given to you by your health care provider. Make sure you discuss any questions you have with your health care provider. Document Released: 02/21/2015 Document Revised: 10/15/2015 Document Reviewed: 11/26/2014 Elsevier Interactive Patient Education  2017 [employer] Prevention in the Home Falls can cause injuries. They can happen to people of all ages. There are many things you can do to make your home safe and to help prevent falls. What can I do on the outside of my home? Regularly fix the edges of walkways and driveways and fix any cracks. Remove anything that might make you trip as you walk through a door, such as a raised step or threshold. Trim any bushes or trees on the path to your home. Use bright outdoor lighting. Clear any walking paths of anything that might make someone trip, such as rocks or tools. Regularly check to see if handrails are loose or broken. Make sure that both sides of any steps have handrails. Any raised decks and porches should have guardrails on the edges. Have any leaves, snow, or ice cleared regularly. Use sand or salt on walking paths during winter. Clean up any spills in your garage right away. This includes oil or grease spills. What can I do in the bathroom? Use night lights. Install grab bars by the toilet and in the tub and shower. Do not use towel bars as grab bars. Use non-skid mats or decals in the tub or shower. If you need to sit down in the shower, use a plastic, non-slip stool. Keep the floor dry. Clean up any water that spills on the floor as soon as it happens. Remove soap buildup in the tub or shower  regularly. Attach bath mats securely with double-sided non-slip rug tape. Do not have throw rugs and other things on the floor that can make you trip. What can I do in the bedroom? Use night lights. Make sure that you have a light by your bed that is easy to reach. Do not use any sheets or blankets that are too big for your bed. They should not hang down onto the floor. Have a firm chair that has side arms. You can use this for support while you get dressed. Do not have throw rugs and other things on the floor that can make you trip. What can I do in the kitchen? Clean up any spills right away. Avoid walking on wet floors. Keep items that you use a lot in easy-to-reach places. If you need to reach something above you, use a strong step stool that has a grab bar. Keep electrical cords out of the way. Do not use floor polish or wax that makes floors slippery. If you must use wax, use non-skid floor wax. Do not have throw rugs and other things on the floor that can make you trip. What can I do with my stairs? Do not leave any items on the stairs. Make sure that there are handrails on both sides of the stairs and use them. Fix handrails that are broken or loose. Make sure that handrails are as long as the stairways. Check any  carpeting to make sure that it is firmly attached to the stairs. Fix any carpet that is loose or worn. Avoid having throw rugs at the top or bottom of the stairs. If you do have throw rugs, attach them to the floor with carpet tape. Make sure that you have a light switch at the top of the stairs and the bottom of the stairs. If you do not have them, ask someone to add them for you. What else can I do to help prevent falls? Wear shoes that: Do not have high heels. Have rubber bottoms. Are comfortable and fit you well. Are closed at the toe. Do not wear sandals. If you use a stepladder: Make sure that it is fully opened. Do not climb a closed stepladder. Make sure that  both sides of the stepladder are locked into place. Ask someone to hold it for you, if possible. Clearly mark and make sure that you can see: Any grab bars or handrails. First and last steps. Where the edge of each step is. Use tools that help you move around (mobility aids) if they are needed. These include: Canes. Walkers. Scooters. Crutches. Turn on the lights when you go into a dark area. Replace any light bulbs as soon as they burn out. Set up your furniture so you have a clear path. Avoid moving your furniture around. If any of your floors are uneven, fix them. If there are any pets around you, be aware of where they are. Review your medicines with your doctor. Some medicines can make you feel dizzy. This can increase your chance of falling. Ask your doctor what other things that you can do to help prevent falls. This information is not intended to replace advice given to you by your health care provider. Make sure you discuss any questions you have with your health care provider. Document Released: 11/21/2008 Document Revised: 07/03/2015 Document Reviewed: 03/01/2014 Elsevier Interactive Patient Education  2017 [employer].

## 2021-07-23 ENCOUNTER — Other Ambulatory Visit: Payer: Self-pay | Admitting: Family Medicine

## 2021-07-23 DIAGNOSIS — M109 Gout, unspecified: Secondary | ICD-10-CM

## 2021-07-23 NOTE — Telephone Encounter (Signed)
Requested Prescriptions  Pending Prescriptions Disp Refills  . colchicine 0.6 MG tablet [Pharmacy Med Name: COLCHICINE 0.'6MG'$  TABLETS] 10 tablet 0    Sig: TAKE 1 TABLET(0.6 MG) BY MOUTH DAILY     Endocrinology:  Gout Agents - colchicine Failed - 07/23/2021 10:40 AM      Failed - Cr in normal range and within 360 days    Creatinine  Date Value Ref Range Status  07/21/2020 1.2 (A) 0.5 - 1.1 Final   Creatinine, Ser  Date Value Ref Range Status  05/14/2020 1.33 (H) 0.57 - 1.00 mg/dL Final         Failed - ALT in normal range and within 360 days    ALT  Date Value Ref Range Status  07/21/2020 9 7 - 35 Final         Failed - AST in normal range and within 360 days    AST  Date Value Ref Range Status  07/21/2020 19 13 - 35 Final         Failed - CBC within normal limits and completed in the last 12 months    WBC  Date Value Ref Range Status  08/29/2017 7.4 3.6 - 11.0 K/uL Final   RBC  Date Value Ref Range Status  08/29/2017 3.68 (L) 3.80 - 5.20 MIL/uL Final   Hemoglobin  Date Value Ref Range Status  08/29/2017 10.7 (L) 12.0 - 16.0 g/dL Final   HCT  Date Value Ref Range Status  08/29/2017 32.6 (L) 35.0 - 47.0 % Final   MCHC  Date Value Ref Range Status  08/29/2017 32.9 32.0 - 36.0 g/dL Final   MCH  Date Value Ref Range Status  08/29/2017 29.2 26.0 - 34.0 pg Final   MCV  Date Value Ref Range Status  08/29/2017 88.6 80.0 - 100.0 fL Final   No results found for: "PLTCOUNTKUC", "LABPLAT", "POCPLA" RDW  Date Value Ref Range Status  08/29/2017 14.1 11.5 - 14.5 % Final         Passed - Valid encounter within last 12 months    Recent Outpatient Visits          1 month ago Acute gout of left foot, unspecified cause   [facility] Juline Patch, MD   1 month ago Essential hypertension   [place], Deanna C, MD   8 months ago Essential hypertension   [place], Deanna C, MD   1 year ago Essential hypertension    [place], Deanna C, MD   1 year ago Type 2 diabetes mellitus without complication, without long-term current use of insulin ([place])   [facility] Juline Patch, MD      Future Appointments            In 4 months Juline Patch, MD [facility], [place]

## 2021-07-24 DIAGNOSIS — E1169 Type 2 diabetes mellitus with other specified complication: Secondary | ICD-10-CM | POA: Diagnosis not present

## 2021-07-24 DIAGNOSIS — E1159 Type 2 diabetes mellitus with other circulatory complications: Secondary | ICD-10-CM | POA: Diagnosis not present

## 2021-07-24 DIAGNOSIS — E1129 Type 2 diabetes mellitus with other diabetic kidney complication: Secondary | ICD-10-CM | POA: Diagnosis not present

## 2021-07-24 DIAGNOSIS — R809 Proteinuria, unspecified: Secondary | ICD-10-CM | POA: Diagnosis not present

## 2021-07-24 DIAGNOSIS — I152 Hypertension secondary to endocrine disorders: Secondary | ICD-10-CM | POA: Diagnosis not present

## 2021-07-24 DIAGNOSIS — E785 Hyperlipidemia, unspecified: Secondary | ICD-10-CM | POA: Diagnosis not present

## 2021-07-24 LAB — TSH: TSH: 0.98 (ref 0.41–5.90)

## 2021-07-24 LAB — MICROALBUMIN / CREATININE URINE RATIO: Microalb Creat Ratio: 135

## 2021-07-24 LAB — MICROALBUMIN, URINE: Microalb, Ur: 54

## 2021-07-24 LAB — LIPID PANEL
Cholesterol: 135 (ref 0–200)
HDL: 44 (ref 35–70)
Triglycerides: 99 (ref 40–160)

## 2021-07-24 LAB — BASIC METABOLIC PANEL
BUN: 17 (ref 4–21)
Creatinine: 1.1 (ref 0.5–1.1)
Glucose: 79
Potassium: 4 mEq/L (ref 3.5–5.1)
Sodium: 141 (ref 137–147)

## 2021-07-24 LAB — PROTEIN / CREATININE RATIO, URINE: Creatinine, Urine: 40

## 2021-07-24 LAB — HM DIABETES FOOT EXAM: HM Diabetic Foot Exam: NORMAL

## 2021-07-24 LAB — COMPREHENSIVE METABOLIC PANEL: Calcium: 9.9 (ref 8.7–10.7)

## 2021-08-06 DIAGNOSIS — E119 Type 2 diabetes mellitus without complications: Secondary | ICD-10-CM | POA: Diagnosis not present

## 2021-08-06 LAB — HM DIABETES EYE EXAM

## 2021-08-27 ENCOUNTER — Ambulatory Visit (INDEPENDENT_AMBULATORY_CARE_PROVIDER_SITE_OTHER): Payer: Medicare HMO | Admitting: Family Medicine

## 2021-08-27 ENCOUNTER — Encounter: Payer: Self-pay | Admitting: Family Medicine

## 2021-08-27 VITALS — BP 164/68 | HR 72 | Ht 66.0 in | Wt 164.0 lb

## 2021-08-27 DIAGNOSIS — I10 Essential (primary) hypertension: Secondary | ICD-10-CM | POA: Diagnosis not present

## 2021-08-27 MED ORDER — CARVEDILOL 3.125 MG PO TABS: 3.1250 mg | ORAL_TABLET | Freq: Two times a day (BID) | ORAL | 3 refills | Status: DC

## 2021-08-27 NOTE — Patient Instructions (Signed)

## 2021-08-27 NOTE — Progress Notes (Signed)
Date:  08/27/2021   Name:  April [name]   DOB:  24-Jan-1941   MRN:  [id number]   Chief Complaint: Hypertension (Bp check for bus physical)  Hypertension This is a chronic problem. The current episode started more than 1 year ago. The problem has been gradually improving since onset. The problem is controlled. Pertinent negatives include no anxiety, blurred vision, chest pain, headaches, malaise/fatigue, neck pain, orthopnea, palpitations, peripheral edema, PND, shortness of breath or sweats. There are no associated agents to hypertension.    Lab Results  Component Value Date   NA 142 07/21/2020   K 4.1 07/21/2020   CO2 22 05/14/2020   GLUCOSE 123 (H) 05/14/2020   BUN 27 (A) 07/21/2020   CREATININE 1.2 (A) 07/21/2020   CALCIUM 10.0 05/14/2020   EGFR 40 (L) 05/14/2020   GFRNONAA 51 (L) 09/24/2019   Lab Results  Component Value Date   CHOL 142 07/21/2020   HDL 52 07/21/2020   LDLCALC 72 07/21/2020   TRIG 86 07/21/2020   CHOLHDL 3.1 09/24/2019   Lab Results  Component Value Date   TSH 1.00 07/21/2020   Lab Results  Component Value Date   HGBA1C 7.7 06/23/2021   Lab Results  Component Value Date   WBC 7.4 08/29/2017   HGB 10.7 (L) 08/29/2017   HCT 32.6 (L) 08/29/2017   MCV 88.6 08/29/2017   PLT 284 08/29/2017   Lab Results  Component Value Date   ALT 9 07/21/2020   AST 19 07/21/2020   ALKPHOS 67 09/24/2019   BILITOT 0.4 09/24/2019   No results found for: "25OHVITD2", "25OHVITD3", "VD25OH"   Review of Systems  Constitutional:  Negative for chills, fever and malaise/fatigue.  HENT:  Negative for drooling, ear discharge, ear pain and sore throat.   Eyes:  Negative for blurred vision.  Respiratory:  Negative for cough, shortness of breath and wheezing.   Cardiovascular:  Negative for chest pain, palpitations, orthopnea, leg swelling and PND.  Gastrointestinal:  Negative for abdominal pain, blood in stool, constipation, diarrhea and nausea.  Endocrine: Negative  for polydipsia.  Genitourinary:  Negative for dysuria, frequency, hematuria and urgency.  Musculoskeletal:  Negative for back pain, myalgias and neck pain.  Skin:  Negative for rash.  Allergic/Immunologic: Negative for environmental allergies.  Neurological:  Negative for dizziness and headaches.  Hematological:  Does not bruise/bleed easily.  Psychiatric/Behavioral:  Negative for suicidal ideas. The patient is not nervous/anxious.     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   Essential hypertension 06/20/2014   Routine sports examination 06/20/2014   Health examination of defined subpopulation 06/20/2014   Hyperlipidemia due to type 2 diabetes mellitus ([place]) 06/20/2014    No Known Allergies  Past Surgical History:  Procedure Laterality Date   APPENDECTOMY     gallstones  06/12/2009    Social History   Tobacco Use   Smoking status: Former   Smokeless tobacco: Never  Scientific laboratory technician Use: Never used  Substance Use Topics   Alcohol use: No    Alcohol/week: 0.0 standard drinks of alcohol   Drug use: No     Medication list has been reviewed and updated.  Current Meds  Medication Sig   aspirin 81 MG tablet Take 1 tablet by  mouth daily.   colchicine 0.6 MG tablet TAKE 1 TABLET(0.6 MG) BY MOUTH DAILY   diltiazem (DILACOR XR) 240 MG 24 hr capsule Take 1 capsule (240 mg total) by mouth daily.   glucose blood (ACCU-CHEK AVIVA PLUS) test strip USE TWICE DAILY AS DIRECTED   lisinopril-hydrochlorothiazide (ZESTORETIC) 20-12.5 MG tablet Take 1 tablet by mouth daily.   metFORMIN (GLUCOPHAGE) 500 MG tablet TAKE 1 TABLET TWICE DAILY (Patient taking differently: 500 mg. TAKE 1 TABLETin morning and 2 at night/ Dr Honor Junes)    simvastatin (ZOCOR) 20 MG tablet Take 1 tablet (20 mg total) by mouth daily.   tiZANidine (ZANAFLEX) 4 MG tablet TAKE 1 TABLET AT BEDTIME   Current Facility-Administered Medications for the 08/27/21 encounter (Office Visit) with Juline Patch, MD  Medication   albuterol (PROVENTIL) (2.5 MG/3ML) 0.083% nebulizer solution 2.5 mg       06/23/2021   11:37 AM 06/01/2021   10:19 AM 05/14/2020   11:29 AM 09/24/2019    9:04 AM  GAD 7 : Generalized Anxiety Score  Nervous, Anxious, on Edge 0 0 0 0  Control/stop worrying 0 0 0 0  Worry too much - different things 0 0 0 0  Trouble relaxing 0 0 0 0  Restless 0 0 0 0  Easily annoyed or irritable 0 0 0 0  Afraid - awful might happen 0 0 0 0  Total GAD 7 Score 0 0 0 0  Anxiety Difficulty Not difficult at all Not difficult at all  Not difficult at all       07/13/2021    8:55 AM 06/23/2021   11:37 AM 06/01/2021   10:19 AM  Depression screen PHQ 2/9  Decreased Interest 0 0 0  Down, Depressed, Hopeless 0 0 0  PHQ - 2 Score 0 0 0  Altered sleeping 0 0 0  Tired, decreased energy 0 0 0  Change in appetite 0 0 0  Feeling bad or failure about yourself  0 0 0  Trouble concentrating 0 0 0  Moving slowly or fidgety/restless 0 0 0  Suicidal thoughts 0 0 0  PHQ-9 Score 0 0 0  Difficult doing work/chores Not difficult at all Not difficult at all Not difficult at all    BP Readings from Last 3 Encounters:  08/27/21 (!) 170/62  06/23/21 (!) 178/64  06/01/21 138/60    Physical Exam Vitals and nursing note reviewed.  Constitutional:      Appearance: She is well-developed.  HENT:     Head: Normocephalic.     Right Ear: External ear normal.     Left Ear: External ear normal.  Eyes:     General: Lids are everted, no foreign bodies appreciated. No scleral icterus.       Left eye: No foreign body or hordeolum.     Conjunctiva/sclera: Conjunctivae normal.     Right eye: Right conjunctiva is not injected.     Left eye: Left conjunctiva is not  injected.     Pupils: Pupils are equal, round, and reactive to light.  Neck:     Thyroid: No thyromegaly.     Vascular: No JVD.     Trachea: No tracheal deviation.  Cardiovascular:     Rate and Rhythm: Normal rate and regular rhythm.     Heart sounds: Normal heart sounds. No murmur heard.    No friction rub. No gallop.  Pulmonary:     Effort: Pulmonary effort is normal. No respiratory distress.  Breath sounds: Normal breath sounds. No wheezing or rales.  Abdominal:     General: Bowel sounds are normal.     Palpations: Abdomen is soft. There is no mass.     Tenderness: There is no abdominal tenderness. There is no guarding or rebound.  Musculoskeletal:        General: No tenderness. Normal range of motion.     Cervical back: Normal range of motion and neck supple.  Lymphadenopathy:     Cervical: No cervical adenopathy.  Skin:    General: Skin is warm.     Findings: No rash.  Neurological:     Mental Status: She is alert and oriented to person, place, and time.     Cranial Nerves: No cranial nerve deficit.     Deep Tendon Reflexes: Reflexes normal.  Psychiatric:        Mood and Affect: Mood is not anxious or depressed.     Wt Readings from Last 3 Encounters:  08/27/21 164 lb (74.4 kg)  06/23/21 165 lb (74.8 kg)  06/01/21 166 lb (75.3 kg)    BP (!) 170/62   Pulse 72   Ht _0  (1.676 m)   Wt 164 lb (74.4 kg)   BMI 26.47 kg/m   Assessment and Plan:  1. Essential hypertension Chronic.  Uncontrolled.  Stable.  Patient is maximized with her diltiazem and ACE/diuretic combination.  I do not want to add spironolactone in the event that she I do not want my decrease intravascular volume which may affect her renal function.  Given add carvedilol 3.125 twice a day and will recheck patient in 2 weeks.

## 2021-09-08 ENCOUNTER — Other Ambulatory Visit: Payer: Self-pay | Admitting: Family Medicine

## 2021-09-08 DIAGNOSIS — M1710 Unilateral primary osteoarthritis, unspecified knee: Secondary | ICD-10-CM

## 2021-09-11 ENCOUNTER — Ambulatory Visit: Payer: Medicare HMO | Admitting: Family Medicine

## 2021-09-11 ENCOUNTER — Encounter: Payer: Self-pay | Admitting: Family Medicine

## 2021-09-11 ENCOUNTER — Ambulatory Visit (INDEPENDENT_AMBULATORY_CARE_PROVIDER_SITE_OTHER): Payer: Medicare HMO | Admitting: Family Medicine

## 2021-09-11 VITALS — BP 138/70 | HR 60 | Ht 66.0 in | Wt 161.0 lb

## 2021-09-11 DIAGNOSIS — M109 Gout, unspecified: Secondary | ICD-10-CM | POA: Diagnosis not present

## 2021-09-11 DIAGNOSIS — I10 Essential (primary) hypertension: Secondary | ICD-10-CM | POA: Diagnosis not present

## 2021-09-11 MED ORDER — COLCHICINE 0.6 MG PO TABS: ORAL_TABLET | ORAL | 1 refills | Status: DC

## 2021-09-11 MED ORDER — CARVEDILOL 6.25 MG PO TABS: 6.2500 mg | ORAL_TABLET | Freq: Two times a day (BID) | ORAL | 3 refills | Status: DC

## 2021-09-11 NOTE — Progress Notes (Signed)
Date:  09/11/2021   Name:  April [name]   DOB:  January 29, 1941   MRN:  [id number]   Chief Complaint: Hypertension and Foot Pain (L) toe pain, Hx gout)  Hypertension This is a chronic problem. The current episode started more than 1 year ago. The problem has been gradually improving since onset. The problem is controlled. Pertinent negatives include no blurred vision, chest pain, headaches, neck pain, orthopnea, palpitations, PND or shortness of breath. Risk factors for coronary artery disease include dyslipidemia. Past treatments include ACE inhibitors, diuretics, calcium channel blockers and beta blockers. The current treatment provides moderate improvement. There are no compliance problems.  There is no history of angina, kidney disease, CAD/MI, CVA, heart failure, left ventricular hypertrophy, PVD or retinopathy. There is no history of chronic renal disease, a hypertension causing med or renovascular disease.  Foot Pain Pertinent negatives include no abdominal pain, chest pain, chills, coughing, fever, headaches, myalgias, nausea, neck pain, rash or sore throat.    Lab Results  Component Value Date   NA 142 07/21/2020   K 4.1 07/21/2020   CO2 22 05/14/2020   GLUCOSE 123 (H) 05/14/2020   BUN 27 (A) 07/21/2020   CREATININE 1.2 (A) 07/21/2020   CALCIUM 10.0 05/14/2020   EGFR 40 (L) 05/14/2020   GFRNONAA 51 (L) 09/24/2019   Lab Results  Component Value Date   CHOL 142 07/21/2020   HDL 52 07/21/2020   LDLCALC 72 07/21/2020   TRIG 86 07/21/2020   CHOLHDL 3.1 09/24/2019   Lab Results  Component Value Date   TSH 1.00 07/21/2020   Lab Results  Component Value Date   HGBA1C 7.7 06/23/2021   Lab Results  Component Value Date   WBC 7.4 08/29/2017   HGB 10.7 (L) 08/29/2017   HCT 32.6 (L) 08/29/2017   MCV 88.6 08/29/2017   PLT 284 08/29/2017   Lab Results  Component Value Date   ALT 9 07/21/2020   AST 19 07/21/2020   ALKPHOS 67 09/24/2019   BILITOT 0.4 09/24/2019   No  results found for: "25OHVITD2", "25OHVITD3", "VD25OH"   Review of Systems  Constitutional:  Negative for chills and fever.  HENT:  Negative for drooling, ear discharge, ear pain and sore throat.   Eyes:  Negative for blurred vision.  Respiratory:  Negative for cough, shortness of breath and wheezing.   Cardiovascular:  Negative for chest pain, palpitations, orthopnea, leg swelling and PND.  Gastrointestinal:  Negative for abdominal pain, blood in stool, constipation, diarrhea and nausea.  Endocrine: Negative for polydipsia.  Genitourinary:  Negative for dysuria, frequency, hematuria and urgency.  Musculoskeletal:  Negative for back pain, myalgias and neck pain.  Skin:  Negative for rash.  Allergic/Immunologic: Negative for environmental allergies.  Neurological:  Negative for dizziness and headaches.  Hematological:  Does not bruise/bleed easily.  Psychiatric/Behavioral:  Negative for suicidal ideas. The patient is not nervous/anxious.     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   Alimentary obesity 06/20/2014   Essential hypertension 06/20/2014   Routine sports examination 06/20/2014   Health examination of defined subpopulation 06/20/2014   Hyperlipidemia due to type 2 diabetes mellitus ([place]) 06/20/2014    No Known Allergies  Past Surgical History:  Procedure Laterality Date   APPENDECTOMY     gallstones  06/12/2009  Social History   Tobacco Use   Smoking status: Former   Smokeless tobacco: Never  Scientific laboratory technician Use: Never used  Substance Use Topics   Alcohol use: No    Alcohol/week: 0.0 standard drinks of alcohol   Drug use: No     Medication list has been reviewed and updated.  Current Meds  Medication Sig   aspirin 81 MG tablet  Take 1 tablet by mouth daily.   carvedilol (COREG) 3.125 MG tablet Take 1 tablet (3.125 mg total) by mouth 2 (two) times daily with a meal.   colchicine 0.6 MG tablet TAKE 1 TABLET(0.6 MG) BY MOUTH DAILY   diltiazem (DILACOR XR) 240 MG 24 hr capsule Take 1 capsule (240 mg total) by mouth daily.   glucose blood (ACCU-CHEK AVIVA PLUS) test strip USE TWICE DAILY AS DIRECTED   lisinopril-hydrochlorothiazide (ZESTORETIC) 20-12.5 MG tablet Take 1 tablet by mouth daily.   metFORMIN (GLUCOPHAGE) 500 MG tablet TAKE 1 TABLET TWICE DAILY (Patient taking differently: 500 mg. TAKE 1 TABLETin morning and 2 at night/ Dr Honor Junes)   simvastatin (ZOCOR) 20 MG tablet Take 1 tablet (20 mg total) by mouth daily.   tiZANidine (ZANAFLEX) 4 MG tablet TAKE 1 TABLET AT BEDTIME   Current Facility-Administered Medications for the 09/11/21 encounter (Office Visit) with Juline Patch, MD  Medication   albuterol (PROVENTIL) (2.5 MG/3ML) 0.083% nebulizer solution 2.5 mg       09/11/2021   10:14 AM 06/23/2021   11:37 AM 06/01/2021   10:19 AM 05/14/2020   11:29 AM  GAD 7 : Generalized Anxiety Score  Nervous, Anxious, on Edge 0 0 0 0  Control/stop worrying 0 0 0 0  Worry too much - different things 0 0 0 0  Trouble relaxing 0 0 0 0  Restless 0 0 0 0  Easily annoyed or irritable 0 0 0 0  Afraid - awful might happen 0 0 0 0  Total GAD 7 Score 0 0 0 0  Anxiety Difficulty Not difficult at all Not difficult at all Not difficult at all        09/11/2021   10:14 AM 07/13/2021    8:55 AM 06/23/2021   11:37 AM  Depression screen PHQ 2/9  Decreased Interest 0 0 0  Down, Depressed, Hopeless 0 0 0  PHQ - 2 Score 0 0 0  Altered sleeping 0 0 0  Tired, decreased energy 0 0 0  Change in appetite 0 0 0  Feeling bad or failure about yourself  0 0 0  Trouble concentrating 0 0 0  Moving slowly or fidgety/restless 0 0 0  Suicidal thoughts 0 0 0  PHQ-9 Score 0 0 0  Difficult doing work/chores Not difficult at all Not difficult at  all Not difficult at all    BP Readings from Last 3 Encounters:  09/11/21 138/70  08/27/21 (!) 164/68  06/23/21 (!) 178/64    Physical Exam HENT:     Right Ear: Tympanic membrane normal.     Left Ear: Tympanic membrane normal.     Nose: Nose normal.     Mouth/Throat:     Mouth: Mucous membranes are moist.  Eyes:     Pupils: Pupils are equal, round, and reactive to light.  Cardiovascular:     Rate and Rhythm: Normal rate.     Heart sounds: No murmur heard.    No friction rub. No gallop.  Pulmonary:     Effort: Pulmonary effort is  normal. No respiratory distress.     Breath sounds: Normal breath sounds. No wheezing or rhonchi.  Musculoskeletal:     Cervical back: Normal range of motion.  Neurological:     Mental Status: She is alert.     Wt Readings from Last 3 Encounters:  09/11/21 161 lb (73 kg)  08/27/21 164 lb (74.4 kg)  06/23/21 165 lb (74.8 kg)    BP 138/70   Pulse 60   Ht _0  (1.676 m)   Wt 161 lb (73 kg)   BMI 25.99 kg/m   Assessment and Plan:  1. Acute gout of left foot, unspecified cause Chronic.  Controlled.  Currently stable The patient however would like to Resume use of colchicine 0 acute --In the near future we will - colchicine 0.6 MG tablet; TAKE 1 TABLET(0.6 MG) BY MOUTH DAILY  Dispense: 10 tablet; Refill: 1  2. Essential hypertension Chronic.  Controlled.  Stable.  Asymptomatic.  Tolerating medication very well.  Blood pressure today 138/70.  Given that patient is still considered in stage I hypertension and we will going to increase her carvedilol to 6.25 (which will be double the dose of her current 3.125 twice a day).  Patient will call if she has any issues with this dosing otherwise we will recheck patient in 6 to 8 weeks. - carvedilol (COREG) 6.25 MG tablet; Take 1 tablet (6.25 mg total) by mouth 2 (two) times daily with a meal.  Dispense: 60 tablet; Refill: 3

## 2021-09-14 ENCOUNTER — Telehealth: Payer: Self-pay | Admitting: Family Medicine

## 2021-09-14 NOTE — Telephone Encounter (Signed)
Pt is calling because she was advised to contact Dr. Ronnald Ramp before taking these two drugs carvedilol (COREG) 6.25 MG tablet [[id number]]  & colchicine 0.6 MG tablet [[id number]]

## 2021-09-16 DIAGNOSIS — E1129 Type 2 diabetes mellitus with other diabetic kidney complication: Secondary | ICD-10-CM | POA: Diagnosis not present

## 2021-09-16 DIAGNOSIS — R809 Proteinuria, unspecified: Secondary | ICD-10-CM | POA: Diagnosis not present

## 2021-09-16 LAB — HEMOGLOBIN A1C: Hemoglobin A1C: 8

## 2021-09-22 ENCOUNTER — Ambulatory Visit (INDEPENDENT_AMBULATORY_CARE_PROVIDER_SITE_OTHER): Payer: Medicare HMO | Admitting: Family Medicine

## 2021-09-22 ENCOUNTER — Telehealth: Payer: Self-pay

## 2021-09-22 ENCOUNTER — Encounter: Payer: Self-pay | Admitting: Family Medicine

## 2021-09-22 VITALS — BP 138/60 | HR 58 | Ht 66.0 in | Wt 160.0 lb

## 2021-09-22 DIAGNOSIS — I10 Essential (primary) hypertension: Secondary | ICD-10-CM | POA: Diagnosis not present

## 2021-09-22 NOTE — Progress Notes (Signed)
Date:  09/22/2021   Name:  April [name]   DOB:  21-Nov-1940   MRN:  [id number]   Chief Complaint: Hypertension  Hypertension This is a chronic problem. The current episode started more than 1 year ago. The problem has been gradually improving since onset. The problem is controlled. Pertinent negatives include no anxiety, blurred vision, chest pain, headaches, malaise/fatigue, neck pain, orthopnea, palpitations, peripheral edema, PND or shortness of breath. Past treatments include ACE inhibitors, diuretics, beta blockers, alpha 1 blockers and calcium channel blockers. The current treatment provides moderate improvement. There are no compliance problems.  There is no history of angina, kidney disease, CAD/MI, CVA, heart failure, left ventricular hypertrophy, PVD or retinopathy. There is no history of chronic renal disease, a hypertension causing med or renovascular disease.    Lab Results  Component Value Date   NA 142 07/21/2020   K 4.1 07/21/2020   CO2 22 05/14/2020   GLUCOSE 123 (H) 05/14/2020   BUN 27 (A) 07/21/2020   CREATININE 1.2 (A) 07/21/2020   CALCIUM 10.0 05/14/2020   EGFR 40 (L) 05/14/2020   GFRNONAA 51 (L) 09/24/2019   Lab Results  Component Value Date   CHOL 142 07/21/2020   HDL 52 07/21/2020   LDLCALC 72 07/21/2020   TRIG 86 07/21/2020   CHOLHDL 3.1 09/24/2019   Lab Results  Component Value Date   TSH 1.00 07/21/2020   Lab Results  Component Value Date   HGBA1C 7.7 06/23/2021   Lab Results  Component Value Date   WBC 7.4 08/29/2017   HGB 10.7 (L) 08/29/2017   HCT 32.6 (L) 08/29/2017   MCV 88.6 08/29/2017   PLT 284 08/29/2017   Lab Results  Component Value Date   ALT 9 07/21/2020   AST 19 07/21/2020   ALKPHOS 67 09/24/2019   BILITOT 0.4 09/24/2019   No results found for: "25OHVITD2", "25OHVITD3", "VD25OH"   Review of Systems  Constitutional:  Negative for malaise/fatigue.  HENT:  Negative for congestion.   Eyes:  Negative for blurred vision.   Respiratory:  Negative for shortness of breath.   Cardiovascular:  Negative for chest pain, palpitations, orthopnea and PND.  Gastrointestinal:  Negative for abdominal pain.  Endocrine: Negative for polydipsia and polyuria.  Musculoskeletal:  Negative for neck pain.  Neurological:  Negative for headaches.    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   Alimentary obesity 06/20/2014   Essential hypertension 06/20/2014   Routine sports examination 06/20/2014   Health examination of defined subpopulation 06/20/2014   Hyperlipidemia due to type 2 diabetes mellitus ([place]) 06/20/2014    No Known Allergies  Past Surgical History:  Procedure Laterality Date   APPENDECTOMY     gallstones  06/12/2009    Social History   Tobacco Use   Smoking status: Former   Smokeless tobacco: Never  Scientific laboratory technician Use: Never used  Substance Use Topics   Alcohol use: No    Alcohol/week: 0.0 standard drinks of alcohol   Drug use: No     Medication list has been reviewed and updated.  Current Meds  Medication Sig   aspirin 81 MG tablet Take 1 tablet by mouth daily.   carvedilol (COREG) 6.25 MG tablet Take 1 tablet (6.25 mg total) by mouth 2 (two) times daily  with a meal.   colchicine 0.6 MG tablet TAKE 1 TABLET(0.6 MG) BY MOUTH DAILY   diltiazem (DILACOR XR) 240 MG 24 hr capsule Take 1 capsule (240 mg total) by mouth daily.   glucose blood (ACCU-CHEK AVIVA PLUS) test strip USE TWICE DAILY AS DIRECTED   lisinopril-hydrochlorothiazide (ZESTORETIC) 20-12.5 MG tablet Take 1 tablet by mouth daily.   metFORMIN (GLUCOPHAGE) 500 MG tablet TAKE 1 TABLET TWICE DAILY (Patient taking differently: 500 mg. TAKE 1 TABLETin morning and 2 at night/ Dr Honor Junes)   simvastatin (ZOCOR) 20  MG tablet Take 1 tablet (20 mg total) by mouth daily.   tiZANidine (ZANAFLEX) 4 MG tablet TAKE 1 TABLET AT BEDTIME   Current Facility-Administered Medications for the 09/22/21 encounter (Office Visit) with Juline Patch, MD  Medication   albuterol (PROVENTIL) (2.5 MG/3ML) 0.083% nebulizer solution 2.5 mg       09/11/2021   10:14 AM 06/23/2021   11:37 AM 06/01/2021   10:19 AM 05/14/2020   11:29 AM  GAD 7 : Generalized Anxiety Score  Nervous, Anxious, on Edge 0 0 0 0  Control/stop worrying 0 0 0 0  Worry too much - different things 0 0 0 0  Trouble relaxing 0 0 0 0  Restless 0 0 0 0  Easily annoyed or irritable 0 0 0 0  Afraid - awful might happen 0 0 0 0  Total GAD 7 Score 0 0 0 0  Anxiety Difficulty Not difficult at all Not difficult at all Not difficult at all        09/11/2021   10:14 AM 07/13/2021    8:55 AM 06/23/2021   11:37 AM  Depression screen PHQ 2/9  Decreased Interest 0 0 0  Down, Depressed, Hopeless 0 0 0  PHQ - 2 Score 0 0 0  Altered sleeping 0 0 0  Tired, decreased energy 0 0 0  Change in appetite 0 0 0  Feeling bad or failure about yourself  0 0 0  Trouble concentrating 0 0 0  Moving slowly or fidgety/restless 0 0 0  Suicidal thoughts 0 0 0  PHQ-9 Score 0 0 0  Difficult doing work/chores Not difficult at all Not difficult at all Not difficult at all    BP Readings from Last 3 Encounters:  09/22/21 138/60  09/11/21 138/70  08/27/21 (!) 164/68    Physical Exam Vitals and nursing note reviewed.  HENT:     Right Ear: Tympanic membrane, ear canal and external ear normal.     Left Ear: Tympanic membrane, ear canal and external ear normal.     Nose: Nose normal.  Cardiovascular:     Rate and Rhythm: Normal rate.     Heart sounds: S1 normal and S2 normal.     No systolic murmur is present.     No diastolic murmur is present.     No S3 or S4 sounds.  Pulmonary:     Breath sounds: No wheezing or rhonchi.     Wt Readings from Last 3 Encounters:   09/22/21 160 lb (72.6 kg)  09/11/21 161 lb (73 kg)  08/27/21 164 lb (74.4 kg)    BP 138/60 (BP Location: Right Arm, Cuff Size: Normal)   Pulse (!) 58   Ht 5' 6"  (1.676 m)   Wt 160 lb (72.6 kg)   BMI 25.82 kg/m   Assessment and Plan:  1. Essential hypertension Chronic.  Controlled.  Stable.  Blood pressure reading 138/60.  Previous reading was  142/60-second readings by Dr. 142/60 and 144/60 machine reading (from urgent care) 160/56.  Please do not approach the "through the ceiling" blood pressure readings at other facility.  Patient was told that we will have to at least put a temporary hold on DOT.  For to cardiology to take a look at the current medication regimen and see if it may be adjusted furthermore. - Ambulatory referral to Cardiology    Otilio Miu, MD

## 2021-09-22 NOTE — Telephone Encounter (Signed)
Called pt and told her I got her in with Delicia White tomorrow 09/23/21 @ [address] in [place]. Also called Mona and left a message as well as spoke to Stanley and gave him info. I advised her to go to urgent care and pick up paperwork in case cardio is able to see her and clear her to drive

## 2021-09-23 DIAGNOSIS — Z7689 Persons encountering health services in other specified circumstances: Secondary | ICD-10-CM | POA: Diagnosis not present

## 2021-09-23 DIAGNOSIS — E782 Mixed hyperlipidemia: Secondary | ICD-10-CM | POA: Diagnosis not present

## 2021-09-23 DIAGNOSIS — E119 Type 2 diabetes mellitus without complications: Secondary | ICD-10-CM | POA: Diagnosis not present

## 2021-09-23 DIAGNOSIS — I10 Essential (primary) hypertension: Secondary | ICD-10-CM | POA: Diagnosis not present

## 2021-09-23 DIAGNOSIS — R001 Bradycardia, unspecified: Secondary | ICD-10-CM | POA: Diagnosis not present

## 2021-09-24 ENCOUNTER — Other Ambulatory Visit: Payer: Self-pay

## 2021-09-24 NOTE — Telephone Encounter (Signed)
Pt calld saying the pharmacy told her it was too early to fill her Carvedilol.  She says Dr.jones changed her dose and she needs a new rx.  [id number]  [phone]

## 2021-09-28 ENCOUNTER — Telehealth: Payer: Self-pay

## 2021-09-28 NOTE — Telephone Encounter (Signed)
Called cardio for pt- she is suppose to go Wed. Aug 23rd at 845 to [facility] for a bp check/pt aware

## 2021-10-07 ENCOUNTER — Ambulatory Visit (INDEPENDENT_AMBULATORY_CARE_PROVIDER_SITE_OTHER): Payer: Medicare HMO

## 2021-10-07 DIAGNOSIS — Z23 Encounter for immunization: Secondary | ICD-10-CM

## 2021-10-14 ENCOUNTER — Encounter: Payer: Self-pay | Admitting: Family Medicine

## 2021-10-14 ENCOUNTER — Ambulatory Visit (INDEPENDENT_AMBULATORY_CARE_PROVIDER_SITE_OTHER): Payer: Medicare HMO | Admitting: Family Medicine

## 2021-10-14 VITALS — BP 138/80 | HR 76 | Ht 66.0 in | Wt 157.0 lb

## 2021-10-14 DIAGNOSIS — I10 Essential (primary) hypertension: Secondary | ICD-10-CM | POA: Diagnosis not present

## 2021-10-16 ENCOUNTER — Encounter: Payer: Self-pay | Admitting: Family Medicine

## 2021-10-16 NOTE — Progress Notes (Signed)
1. Essential hypertension Patient seen for a blood pressure check only.  Blood pressure was measured at 138/80 and note was signed to accompany cardiology evaluation for patient.

## 2021-11-02 ENCOUNTER — Ambulatory Visit: Payer: Self-pay | Admitting: *Deleted

## 2021-11-02 NOTE — Telephone Encounter (Signed)
Summary: possible sinus issues   Pt stated she started with possible sinus issues yesterday light headache, cough. Pt mentioned headache is around her eyes. Pt is scheduled for 11/03/2021 pt needed early appointment as she rides the bus.   Pt seeking clinical advice.       Chief Complaint: sinus pressure Symptoms: cough prod yellow Frequency: started yesterday Pertinent Negatives: Patient denies fever but had chills Disposition: '[]'$ ED /'[]'$ Urgent Care (no appt availability in office) / '[x]'$ Appointment(In office/virtual)/ '[]'$  [facility]/ '[]'$ Home Care/ '[]'$ Refused Recommended Disposition /'[]'$ [facility]/ '[]'$  Follow-up with PCP Additional Notes: pt wanted appt for tomorrow early because she rides the bus. Appt tomorrow morning, home care reviewed.  Reason for Disposition  [1] Using nasal washes and pain medicine > 24 hours AND [2] sinus pain (around cheekbone or eye) persists  Answer Assessment - Initial Assessment Questions 1. LOCATION: "Where does it hurt?"      Hurting around eyes 2. ONSET: "When did the sinus pain start?"  (e.g., hours, days)      yesterday 3. SEVERITY: "How bad is the pain?"   (Scale 1-10; mild, moderate or severe)   - MILD (1-3): doesn't interfere with normal activities    - MODERATE (4-7): interferes with normal activities (e.g., work or school) or awakens from sleep   - SEVERE (8-10): excruciating pain and patient unable to do any normal activities        Not bad 4. RECURRENT SYMPTOM: "Have you ever had sinus problems before?" If Yes, ask: "When was the last time?" and "What happened that time?"      Yes, a long time ago 5. NASAL CONGESTION: "Is the nose blocked?" If Yes, ask: "Can you open it or must you breathe through your mouth?"     Yellow phlegm 6. NASAL DISCHARGE: "Do you have discharge from your nose?" If so ask, "What color?"     Yellow discharge 7. FEVER: "Do you have a fever?" If Yes, ask: "What is it, how was it measured, and when did  it start?"     Have not taken but having chills yesterday 8. OTHER SYMPTOMS: "Do you have any other symptoms?" (e.g., sore throat, cough, earache, difficulty breathing)     Sore scratchy 9. PREGNANCY: "Is there any chance you are pregnant?" "When was your last menstrual period?"     no  Protocols used: Sinus Pain or Congestion-A-AH

## 2021-11-03 ENCOUNTER — Encounter: Payer: Self-pay | Admitting: Family Medicine

## 2021-11-03 ENCOUNTER — Ambulatory Visit (INDEPENDENT_AMBULATORY_CARE_PROVIDER_SITE_OTHER): Payer: Medicare HMO | Admitting: Family Medicine

## 2021-11-03 VITALS — BP 130/80 | HR 64 | Temp 98.3°F | Ht 66.0 in | Wt 157.0 lb

## 2021-11-03 DIAGNOSIS — J0100 Acute maxillary sinusitis, unspecified: Secondary | ICD-10-CM | POA: Diagnosis not present

## 2021-11-03 MED ORDER — AMOXICILLIN 500 MG PO CAPS: 500.0000 mg | ORAL_CAPSULE | Freq: Three times a day (TID) | ORAL | 0 refills | Status: AC

## 2021-11-03 NOTE — Progress Notes (Signed)
Date:  11/03/2021   Name:  April [name]   DOB:  [birth year]   MRN:  [id number]   Chief Complaint: Sinusitis (Yellow production x 3 days)  Sinusitis This is a new problem. The current episode started in the past 7 days. The problem is unchanged. There has been no fever. The pain is mild (maxillary). Associated symptoms include congestion, coughing, sinus pressure and sneezing. Pertinent negatives include no chills, diaphoresis, ear pain, headaches, hoarse voice, neck pain, shortness of breath, sore throat or swollen glands. Past treatments include nothing.    Lab Results  Component Value Date   NA 142 07/21/2020   K 4.1 07/21/2020   CO2 22 05/14/2020   GLUCOSE 123 (H) 05/14/2020   BUN 27 (A) 07/21/2020   CREATININE 1.2 (A) 07/21/2020   CALCIUM 10.0 05/14/2020   EGFR 40 (L) 05/14/2020   GFRNONAA 51 (L) 09/24/2019   Lab Results  Component Value Date   CHOL 142 07/21/2020   HDL 52 07/21/2020   LDLCALC 72 07/21/2020   TRIG 86 07/21/2020   CHOLHDL 3.1 09/24/2019   Lab Results  Component Value Date   TSH 1.00 07/21/2020   Lab Results  Component Value Date   HGBA1C 7.7 06/23/2021   Lab Results  Component Value Date   WBC 7.4 08/29/2017   HGB 10.7 (L) 08/29/2017   HCT 32.6 (L) 08/29/2017   MCV 88.6 08/29/2017   PLT 284 08/29/2017   Lab Results  Component Value Date   ALT 9 07/21/2020   AST 19 07/21/2020   ALKPHOS 67 09/24/2019   BILITOT 0.4 09/24/2019   No results found for: "25OHVITD2", "25OHVITD3", "VD25OH"   Review of Systems  Constitutional:  Negative for chills, diaphoresis and fever.  HENT:  Positive for congestion, sinus pressure and sneezing. Negative for ear pain, hoarse voice and sore throat.   Respiratory:  Positive for cough. Negative for chest tightness, shortness of breath and wheezing.   Cardiovascular:  Negative for chest pain, palpitations and leg swelling.  Musculoskeletal:  Negative for neck pain.  Neurological:  Negative for headaches.     Patient Active Problem List   Diagnosis Date Noted   Elevated transaminase level 04/10/2019   Primary osteoarthritis of knee 10/06/2016   Type 2 diabetes mellitus without complications ([place]) 60/73/7106   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   Alimentary obesity 06/20/2014   Essential hypertension 06/20/2014   Routine sports examination 06/20/2014   Health examination of defined subpopulation 06/20/2014   Hyperlipidemia due to type 2 diabetes mellitus ([place]) 06/20/2014    No Known Allergies  Past Surgical History:  Procedure Laterality Date   APPENDECTOMY     gallstones  06/12/2009    Social History   Tobacco Use   Smoking status: Former   Smokeless tobacco: Never  Scientific laboratory technician Use: Never used  Substance Use Topics   Alcohol use: No    Alcohol/week: 0.0 standard drinks of alcohol   Drug use: No     Medication list has been reviewed and updated.  Current Meds  Medication Sig   aspirin 81 MG tablet Take 1 tablet by mouth daily.   carvedilol (COREG) 3.125 MG tablet Take 1 tablet by mouth 2 (two) times daily. cardio   colchicine 0.6 MG tablet TAKE 1 TABLET(0.6 MG) BY MOUTH DAILY   diltiazem (DILACOR XR) 240 MG 24 hr capsule Take 1 capsule (240 mg total) by mouth daily.   glucose blood (ACCU-CHEK AVIVA PLUS) test strip  USE TWICE DAILY AS DIRECTED   lisinopril-hydrochlorothiazide (ZESTORETIC) 20-25 MG tablet Take 1 tablet by mouth daily. cardio   metFORMIN (GLUCOPHAGE) 500 MG tablet TAKE 1 TABLET TWICE DAILY (Patient taking differently: 500 mg. TAKE 1 TABLETin morning and 2 at night/ Dr Honor Junes)   simvastatin (ZOCOR) 20 MG tablet Take 1 tablet (20 mg total) by mouth daily.   tiZANidine (ZANAFLEX) 4 MG tablet TAKE 1 TABLET AT BEDTIME   Current Facility-Administered Medications for the 11/03/21 encounter (Office Visit) with Juline Patch, MD  Medication   albuterol (PROVENTIL) (2.5 MG/3ML) 0.083% nebulizer solution 2.5 mg        11/03/2021    8:41 AM 10/14/2021   11:36 AM 09/11/2021   10:14 AM 06/23/2021   11:37 AM  GAD 7 : Generalized Anxiety Score  Nervous, Anxious, on Edge 0 0 0 0  Control/stop worrying 0 0 0 0  Worry too much - different things 0 0 0 0  Trouble relaxing 0 0 0 0  Restless 0 0 0 0  Easily annoyed or irritable 0 0 0 0  Afraid - awful might happen 0 0 0 0  Total GAD 7 Score 0 0 0 0  Anxiety Difficulty Not difficult at all Not difficult at all Not difficult at all Not difficult at all       11/03/2021    8:41 AM 10/14/2021   11:36 AM 09/11/2021   10:14 AM  Depression screen PHQ 2/9  Decreased Interest 0 0 0  Down, Depressed, Hopeless 0 0 0  PHQ - 2 Score 0 0 0  Altered sleeping 0 0 0  Tired, decreased energy 0 0 0  Change in appetite 0 0 0  Feeling bad or failure about yourself  0 0 0  Trouble concentrating 0 0 0  Moving slowly or fidgety/restless 0 0 0  Suicidal thoughts 0 0 0  PHQ-9 Score 0 0 0  Difficult doing work/chores Not difficult at all Not difficult at all Not difficult at all    BP Readings from Last 3 Encounters:  11/03/21 130/80  10/14/21 138/80  09/22/21 138/60    Physical Exam Vitals and nursing note reviewed.  Constitutional:      Appearance: She is well-developed.  HENT:     Head: Normocephalic.     Right Ear: Tympanic membrane and external ear normal.     Left Ear: Tympanic membrane and external ear normal.     Nose:     Right Sinus: Frontal sinus tenderness present. No maxillary sinus tenderness.     Left Sinus: Frontal sinus tenderness present. No maxillary sinus tenderness.  Eyes:     General: Lids are everted, no foreign bodies appreciated. No scleral icterus.       Left eye: No foreign body or hordeolum.     Conjunctiva/sclera: Conjunctivae normal.     Right eye: Right conjunctiva is not injected.     Left eye: Left conjunctiva is not injected.     Pupils: Pupils are equal, round, and reactive to light.  Neck:     Thyroid: No thyromegaly.      Vascular: No JVD.     Trachea: No tracheal deviation.  Cardiovascular:     Rate and Rhythm: Normal rate and regular rhythm.     Heart sounds: Normal heart sounds. No murmur heard.    No friction rub. No gallop.  Pulmonary:     Effort: Pulmonary effort is normal. No respiratory distress.     Breath  sounds: Normal breath sounds. No wheezing, rhonchi or rales.  Abdominal:     General: Bowel sounds are normal.     Palpations: Abdomen is soft. There is no mass.     Tenderness: There is no abdominal tenderness. There is no guarding or rebound.  Musculoskeletal:        General: No tenderness. Normal range of motion.     Cervical back: Normal range of motion and neck supple.  Lymphadenopathy:     Cervical: No cervical adenopathy.  Skin:    General: Skin is warm.     Findings: No rash.  Neurological:     Mental Status: She is alert and oriented to person, place, and time.     Cranial Nerves: No cranial nerve deficit.     Deep Tendon Reflexes: Reflexes normal.  Psychiatric:        Mood and Affect: Mood is not anxious or depressed.     Wt Readings from Last 3 Encounters:  11/03/21 157 lb (71.2 kg)  10/14/21 157 lb (71.2 kg)  09/22/21 160 lb (72.6 kg)    BP 130/80   Pulse 64   Temp 98.3 F (36.8 C) (Oral)   Ht 5' 6"  (1.676 m)   Wt 157 lb (71.2 kg)   SpO2 99%   BMI 25.34 kg/m   Assessment and Plan: 1. Acute maxillary sinusitis, recurrence not specified Onset.  Persistent.  Stable.  History and exam is consistent with a frontal sinusitis.  We will treat with amoxicillin 500 mg 3 times a day for 10 days.  Patient has been suggested to pick up over-the-counter Mucinex DM for cough. - amoxicillin (AMOXIL) 500 MG capsule; Take 1 capsule (500 mg total) by mouth 3 (three) times daily for 10 days.  Dispense: 30 capsule; Refill: 0     Otilio Miu, MD

## 2021-11-30 ENCOUNTER — Encounter: Payer: Self-pay | Admitting: Family Medicine

## 2021-11-30 ENCOUNTER — Ambulatory Visit (INDEPENDENT_AMBULATORY_CARE_PROVIDER_SITE_OTHER): Payer: Medicare HMO | Admitting: Family Medicine

## 2021-11-30 VITALS — BP 127/76 | HR 70 | Ht 66.0 in | Wt 158.0 lb

## 2021-11-30 DIAGNOSIS — I10 Essential (primary) hypertension: Secondary | ICD-10-CM

## 2021-11-30 DIAGNOSIS — M1710 Unilateral primary osteoarthritis, unspecified knee: Secondary | ICD-10-CM

## 2021-11-30 DIAGNOSIS — E7849 Other hyperlipidemia: Secondary | ICD-10-CM | POA: Diagnosis not present

## 2021-11-30 DIAGNOSIS — M109 Gout, unspecified: Secondary | ICD-10-CM | POA: Diagnosis not present

## 2021-11-30 MED ORDER — TIZANIDINE HCL 4 MG PO TABS: 4.0000 mg | ORAL_TABLET | Freq: Every day | ORAL | 0 refills | Status: DC

## 2021-11-30 MED ORDER — DILTIAZEM HCL ER 240 MG PO CP24: 240.0000 mg | ORAL_CAPSULE | Freq: Every day | ORAL | 1 refills | Status: DC

## 2021-11-30 MED ORDER — SIMVASTATIN 20 MG PO TABS: 20.0000 mg | ORAL_TABLET | Freq: Every day | ORAL | 1 refills | Status: DC

## 2021-11-30 MED ORDER — COLCHICINE 0.6 MG PO TABS: ORAL_TABLET | ORAL | 1 refills | Status: DC

## 2021-11-30 NOTE — Progress Notes (Signed)
  Date:  11/30/2021   Name:  April [name]   DOB:  10/03/1940   MRN:  [id number]   Chief Complaint: Gout, Hypertension, Hyperlipidemia, and muscle relaxer  Hypertension This is a chronic problem. The current episode started more than 1 year ago. The problem has been gradually improving since onset. The problem is controlled. Pertinent negatives include no anxiety, blurred vision, chest pain, headaches, malaise/fatigue, neck pain, orthopnea, palpitations, peripheral edema, PND, shortness of breath or sweats. There are no associated agents to hypertension. There are no known risk factors for coronary artery disease. Past treatments include calcium channel blockers, alpha 1 blockers, beta blockers, ACE inhibitors and diuretics. The current treatment provides moderate improvement. There are no compliance problems.  There is no history of angina, kidney disease, CAD/MI, CVA, heart failure, left ventricular hypertrophy, PVD or retinopathy. There is no history of chronic renal disease, a hypertension causing med or renovascular disease.  Hyperlipidemia This is a chronic problem. The current episode started more than 1 year ago. The problem is controlled. Recent lipid tests were reviewed and are normal. She has no history of chronic renal disease or diabetes. There are no known factors aggravating her hyperlipidemia. Pertinent negatives include no chest pain, myalgias or shortness of breath. Current antihyperlipidemic treatment includes statins. The current treatment provides moderate improvement of lipids. There are no compliance problems.     Lab Results  Component Value Date   NA 141 07/24/2021   K 4.0 07/24/2021   CO2 22 05/14/2020   GLUCOSE 123 (H) 05/14/2020   BUN 17 07/24/2021   CREATININE 1.1 07/24/2021   CALCIUM 9.9 07/24/2021   EGFR 40 (L) 05/14/2020   GFRNONAA 51 (L) 09/24/2019   Lab Results  Component Value Date   CHOL 135 07/24/2021   HDL 44 07/24/2021   LDLCALC 72 07/21/2020    TRIG 99 07/24/2021   CHOLHDL 3.1 09/24/2019   Lab Results  Component Value Date   TSH 0.98 07/24/2021   Lab Results  Component Value Date   HGBA1C 8.0 09/16/2021   Lab Results  Component Value Date   WBC 7.4 08/29/2017   HGB 10.7 (L) 08/29/2017   HCT 32.6 (L) 08/29/2017   MCV 88.6 08/29/2017   PLT 284 08/29/2017   Lab Results  Component Value Date   ALT 9 07/21/2020   AST 19 07/21/2020   ALKPHOS 67 09/24/2019   BILITOT 0.4 09/24/2019   No results found for: "25OHVITD2", "25OHVITD3", "VD25OH"   Review of Systems  Constitutional:  Negative for chills, fever and malaise/fatigue.  HENT:  Negative for drooling, ear discharge, ear pain and sore throat.   Eyes:  Negative for blurred vision.  Respiratory:  Negative for cough, shortness of breath and wheezing.   Cardiovascular:  Negative for chest pain, palpitations, orthopnea, leg swelling and PND.  Gastrointestinal:  Negative for abdominal pain, blood in stool, constipation, diarrhea and nausea.  Endocrine: Negative for polydipsia.  Genitourinary:  Negative for dysuria, frequency, hematuria and urgency.  Musculoskeletal:  Negative for back pain, myalgias and neck pain.  Skin:  Negative for rash.  Allergic/Immunologic: Negative for environmental allergies.  Neurological:  Negative for dizziness and headaches.  Hematological:  Does not bruise/bleed easily.  Psychiatric/Behavioral:  Negative for suicidal ideas. The patient is not nervous/anxious.     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   Alimentary obesity 06/20/2014   Essential hypertension 06/20/2014   Routine sports examination 06/20/2014   Health examination of defined subpopulation 06/20/2014   Hyperlipidemia due to type 2 diabetes mellitus  ([place]) 06/20/2014    No Known Allergies  Past Surgical History:  Procedure Laterality Date   APPENDECTOMY     gallstones  06/12/2009    Social History   Tobacco Use   Smoking status: Former   Smokeless tobacco: Never  Scientific laboratory technician Use: Never used  Substance Use Topics   Alcohol use: No    Alcohol/week: 0.0 standard drinks of alcohol   Drug use: No     Medication list has been reviewed and updated.  Current Meds  Medication Sig   aspirin 81 MG tablet Take 1 tablet by mouth daily.   carvedilol (COREG) 3.125 MG tablet Take 1 tablet by mouth 2 (two) times daily. cardio   colchicine 0.6 MG tablet TAKE 1 TABLET(0.6 MG) BY MOUTH DAILY   diltiazem (DILACOR XR) 240 MG 24 hr capsule Take 1 capsule (240 mg total) by mouth daily.   glucose blood (ACCU-CHEK AVIVA PLUS) test strip USE TWICE DAILY AS DIRECTED   lisinopril-hydrochlorothiazide (ZESTORETIC) 20-25 MG tablet Take 1 tablet by mouth daily. cardio   metFORMIN (GLUCOPHAGE) 500 MG tablet TAKE 1 TABLET TWICE DAILY (Patient taking differently: 500 mg. TAKE 1 TABLETin morning and 2 at night/ Dr Honor Junes)   simvastatin (ZOCOR) 20 MG tablet Take 1 tablet (20 mg total) by mouth daily.   tiZANidine (ZANAFLEX) 4 MG tablet TAKE 1 TABLET AT BEDTIME   Current Facility-Administered Medications for the 11/30/21 encounter (Office Visit) with Juline Patch, MD  Medication   albuterol (PROVENTIL) (2.5 MG/3ML) 0.083% nebulizer solution 2.5 mg       11/30/2021    8:54 AM 11/03/2021    8:41 AM 10/14/2021   11:36 AM 09/11/2021   10:14 AM  GAD 7 : Generalized Anxiety Score  Nervous, Anxious, on Edge 0 0 0 0  Control/stop worrying 0 0 0 0  Worry too much - different things 0 0 0 0  Trouble relaxing 0 0 0 0  Restless 0 0 0 0  Easily annoyed or irritable 0 0 0 0  Afraid - awful might happen 0 0 0 0  Total GAD 7 Score 0 0 0 0  Anxiety Difficulty Not difficult at all Not difficult at all Not difficult at all Not difficult at all        11/30/2021    8:53 AM 11/03/2021    8:41 AM 10/14/2021   11:36 AM  Depression screen PHQ 2/9  Decreased Interest 0 0 0  Down, Depressed, Hopeless 0 0 0  PHQ - 2 Score 0 0 0  Altered sleeping 0 0 0  Tired, decreased energy 0 0 0  Change in appetite 0 0 0  Feeling bad or failure about yourself  0 0 0  Trouble concentrating 0 0 0  Moving slowly or fidgety/restless 0 0 0  Suicidal thoughts 0 0 0  PHQ-9 Score 0 0 0  Difficult doing work/chores Not difficult at all Not difficult at all Not difficult at all    BP Readings from Last 3 Encounters:  11/30/21 130/80  11/03/21 130/80  10/14/21 138/80    Physical Exam Vitals and nursing note reviewed.  Constitutional:      Appearance: She is well-developed.  HENT:     Head: Normocephalic.  Right Ear: External ear normal.     Left Ear: External ear normal.  Eyes:     General: Lids are everted, no foreign bodies appreciated. No scleral icterus.       Left eye: No foreign body or hordeolum.     Conjunctiva/sclera: Conjunctivae normal.     Right eye: Right conjunctiva is not injected.     Left eye: Left conjunctiva is not injected.     Pupils: Pupils are equal, round, and reactive to light.  Neck:     Thyroid: No thyromegaly.     Vascular: No JVD.     Trachea: No tracheal deviation.  Cardiovascular:     Rate and Rhythm: Normal rate and regular rhythm.     Heart sounds: Normal heart sounds. No murmur heard.    No friction rub. No gallop.  Pulmonary:     Effort: Pulmonary effort is normal. No respiratory distress.     Breath sounds: Normal breath sounds. No wheezing or rales.  Abdominal:     General: Bowel sounds are normal.     Palpations: Abdomen is soft. There is no hepatomegaly, splenomegaly or mass.     Tenderness: There is no abdominal tenderness. There is no guarding or rebound.  Musculoskeletal:        General: No tenderness. Normal range of motion.     Cervical back: Neck supple.  Lymphadenopathy:     Cervical: No  cervical adenopathy.  Skin:    General: Skin is warm.     Findings: No rash.  Neurological:     Mental Status: She is alert.  Psychiatric:        Mood and Affect: Mood is not anxious or depressed.     Wt Readings from Last 3 Encounters:  11/30/21 158 lb (71.7 kg)  11/03/21 157 lb (71.2 kg)  10/14/21 157 lb (71.2 kg)    BP 130/80   Pulse 70   Ht 5' 6" (1.676 m)   Wt 158 lb (71.7 kg)   SpO2 96%   BMI 25.50 kg/m   Assessment and Plan:  1. Essential hypertension Chronic.  Controlled.  Stable.  Repeat blood pressure 127/76.  Asymptomatic.  Stable.  Tolerating all medications well.  We will continue with the regimen which includes Coreg, lisinopril hydrochlorothiazide, and the addition of diltiazem XR to 40 mg once a day.  We will recheck patient in 6 months. - diltiazem (DILACOR XR) 240 MG 24 hr capsule; Take 1 capsule (240 mg total) by mouth daily.  Dispense: 90 capsule; Refill: 1  2. Familial multiple lipoprotein-type hyperlipidemia Chronic.  Controlled.  Stable.  Continue simvastatin 20 mg once a day. - simvastatin (ZOCOR) 20 MG tablet; Take 1 tablet (20 mg total) by mouth daily.  Dispense: 90 tablet; Refill: 1  3. Primary osteoarthritis of knee, unspecified laterality .  Controlled.  Stable.  Continue Zanaflex 4 mg nightly. - tiZANidine (ZANAFLEX) 4 MG tablet; Take 1 tablet (4 mg total) by mouth at bedtime.  Dispense: 90 tablet; Refill: 0  4. Acute gout of left foot, unspecified cause Chronic.  Episodic.  Stable.  Currently stable but patient will have on hand colchicine 0.6 mg as needed acute gout attack. - colchicine 0.6 MG tablet; TAKE 1 TABLET(0.6 MG) BY MOUTH DAILY  Dispense: 10 tablet; Refill: 1    Deanna Jones, MD  

## 2021-12-07 ENCOUNTER — Telehealth: Payer: Self-pay | Admitting: Family Medicine

## 2021-12-07 ENCOUNTER — Telehealth: Payer: Self-pay

## 2021-12-07 ENCOUNTER — Other Ambulatory Visit: Payer: Self-pay

## 2021-12-07 DIAGNOSIS — Z1231 Encounter for screening mammogram for malignant neoplasm of breast: Secondary | ICD-10-CM

## 2021-12-07 NOTE — Telephone Encounter (Signed)
Copied from [place] [id number]. Topic: Appointment Scheduling - Scheduling Inquiry for Clinic >> Dec 07, 2021  8:46 AM Devoria Glassing wrote: Reason for CRM: pt would like [facility] to make her an appt to get her mammogram.  She says morning time works great for her.

## 2021-12-07 NOTE — Telephone Encounter (Signed)
Called pt and gave her the mammo appt for Dec 11th @ 1020 in [place].

## 2021-12-22 DIAGNOSIS — I10 Essential (primary) hypertension: Secondary | ICD-10-CM | POA: Diagnosis not present

## 2021-12-22 DIAGNOSIS — R001 Bradycardia, unspecified: Secondary | ICD-10-CM | POA: Diagnosis not present

## 2021-12-22 DIAGNOSIS — E785 Hyperlipidemia, unspecified: Secondary | ICD-10-CM | POA: Diagnosis not present

## 2021-12-22 DIAGNOSIS — E1169 Type 2 diabetes mellitus with other specified complication: Secondary | ICD-10-CM | POA: Diagnosis not present

## 2021-12-22 DIAGNOSIS — I160 Hypertensive urgency: Secondary | ICD-10-CM | POA: Diagnosis not present

## 2021-12-30 ENCOUNTER — Other Ambulatory Visit: Payer: Self-pay | Admitting: Family Medicine

## 2021-12-30 DIAGNOSIS — I10 Essential (primary) hypertension: Secondary | ICD-10-CM

## 2021-12-30 NOTE — Telephone Encounter (Signed)
Refused lisinopril-HCTZ 20-12.5 mg because it was discontinued 09/24/2021 due to a dose change.

## 2022-01-11 DIAGNOSIS — R809 Proteinuria, unspecified: Secondary | ICD-10-CM | POA: Diagnosis not present

## 2022-01-11 DIAGNOSIS — E1129 Type 2 diabetes mellitus with other diabetic kidney complication: Secondary | ICD-10-CM | POA: Diagnosis not present

## 2022-01-11 DIAGNOSIS — E1169 Type 2 diabetes mellitus with other specified complication: Secondary | ICD-10-CM | POA: Diagnosis not present

## 2022-01-11 DIAGNOSIS — I152 Hypertension secondary to endocrine disorders: Secondary | ICD-10-CM | POA: Diagnosis not present

## 2022-01-11 DIAGNOSIS — E1159 Type 2 diabetes mellitus with other circulatory complications: Secondary | ICD-10-CM | POA: Diagnosis not present

## 2022-01-11 DIAGNOSIS — E785 Hyperlipidemia, unspecified: Secondary | ICD-10-CM | POA: Diagnosis not present

## 2022-01-18 ENCOUNTER — Ambulatory Visit
Admission: RE | Admit: 2022-01-18 | Discharge: 2022-01-18 | Disposition: A | Discharge status: Home / Self Care | Payer: Medicare HMO | Source: Ambulatory Visit | Attending: Family Medicine | Admitting: Family Medicine

## 2022-01-18 DIAGNOSIS — Z1231 Encounter for screening mammogram for malignant neoplasm of breast: Secondary | ICD-10-CM | POA: Insufficient documentation

## 2022-01-21 DIAGNOSIS — E785 Hyperlipidemia, unspecified: Secondary | ICD-10-CM | POA: Diagnosis not present

## 2022-01-21 DIAGNOSIS — E782 Mixed hyperlipidemia: Secondary | ICD-10-CM | POA: Diagnosis not present

## 2022-01-21 DIAGNOSIS — R001 Bradycardia, unspecified: Secondary | ICD-10-CM | POA: Diagnosis not present

## 2022-01-21 DIAGNOSIS — I10 Essential (primary) hypertension: Secondary | ICD-10-CM | POA: Diagnosis not present

## 2022-01-21 DIAGNOSIS — E1169 Type 2 diabetes mellitus with other specified complication: Secondary | ICD-10-CM | POA: Diagnosis not present

## 2022-02-22 ENCOUNTER — Other Ambulatory Visit: Payer: Self-pay | Admitting: Family Medicine

## 2022-02-22 DIAGNOSIS — M109 Gout, unspecified: Secondary | ICD-10-CM

## 2022-02-23 NOTE — Telephone Encounter (Signed)
Requested medication (s) are due for refill today:   Yes  Requested medication (s) are on the active medication list:   Yes  Future visit scheduled:   Yes   Last ordered: 11/30/2021 #10, 1 refill  Returned because labs are due per protocol.   Requested Prescriptions  Pending Prescriptions Disp Refills   colchicine 0.6 MG tablet [Pharmacy Med Name: COLCHICINE 0.6 MG Tablet] 20 tablet 3    Sig: TAKE 1 TABLET EVERY DAY     Endocrinology:  Gout Agents - colchicine Failed - 02/22/2022  1:29 PM      Failed - ALT in normal range and within 360 days    ALT  Date Value Ref Range Status  07/21/2020 9 7 - 35 Final         Failed - AST in normal range and within 360 days    AST  Date Value Ref Range Status  07/21/2020 19 13 - 35 Final         Failed - CBC within normal limits and completed in the last 12 months    WBC  Date Value Ref Range Status  08/29/2017 7.4 3.6 - 11.0 K/uL Final   RBC  Date Value Ref Range Status  08/29/2017 3.68 (L) 3.80 - 5.20 MIL/uL Final   Hemoglobin  Date Value Ref Range Status  08/29/2017 10.7 (L) 12.0 - 16.0 g/dL Final   HCT  Date Value Ref Range Status  08/29/2017 32.6 (L) 35.0 - 47.0 % Final   MCHC  Date Value Ref Range Status  08/29/2017 32.9 32.0 - 36.0 g/dL Final   [facility]  Date Value Ref Range Status  08/29/2017 29.2 26.0 - 34.0 pg Final   MCV  Date Value Ref Range Status  08/29/2017 88.6 80.0 - 100.0 fL Final   No results found for: "PLTCOUNTKUC", "LABPLAT", "POCPLA" RDW  Date Value Ref Range Status  08/29/2017 14.1 11.5 - 14.5 % Final         Passed - Cr in normal range and within 360 days    Creatinine  Date Value Ref Range Status  07/24/2021 1.1 0.5 - 1.1 Final   Creatinine, Ser  Date Value Ref Range Status  05/14/2020 1.33 (H) 0.57 - 1.00 mg/dL Final   Creatinine, Urine  Date Value Ref Range Status  07/24/2021 40  Final         Passed - Valid encounter within last 12 months    Recent Outpatient Visits            2 months ago Essential hypertension   [facility] [place], Deanna C, MD   3 months ago Acute maxillary sinusitis, recurrence not specified   [facility] [place], [place], MD   4 months ago Essential hypertension   [facility] [place], [place], MD   5 months ago Essential hypertension   [facility] [facility] [place], [place], MD   5 months ago Essential hypertension   [facility] [place], [place], MD       Future Appointments             In 1 month Juline Patch, MD [facility] at [facility], [place]   In 3 months Juline Patch, MD [place]  [facility] at [facility]

## 2022-03-05 ENCOUNTER — Ambulatory Visit: Payer: Self-pay | Admitting: *Deleted

## 2022-03-05 NOTE — Telephone Encounter (Signed)
This has been forwarded to Dr. Otilio Miu for verification for the insurance company.  See message attached.

## 2022-03-05 NOTE — Telephone Encounter (Signed)
Message from Luciana Axe sent at 03/05/2022 11:28 AM EST  Summary: Verfication of Chronic Condition   Aira calling from [facility] is calling to verify chronic condition for the patient.          Call History   Type Contact Phone/Fax User  03/05/2022 11:26 AM EST Phone (Incoming) Aira [phone] Blase Mess A

## 2022-03-05 NOTE — Telephone Encounter (Signed)
Called and spoke with [facility]. Unable to get back to who called the office and they are unsure of reason for call.  Darleny Sem

## 2022-04-02 ENCOUNTER — Ambulatory Visit: Payer: Medicare HMO | Admitting: Family Medicine

## 2022-04-08 ENCOUNTER — Ambulatory Visit (INDEPENDENT_AMBULATORY_CARE_PROVIDER_SITE_OTHER): Payer: Medicare HMO | Admitting: Family Medicine

## 2022-04-08 ENCOUNTER — Encounter: Payer: Self-pay | Admitting: Family Medicine

## 2022-04-08 VITALS — BP 120/60 | HR 72 | Ht 66.0 in | Wt 153.0 lb

## 2022-04-08 DIAGNOSIS — M5136 Other intervertebral disc degeneration, lumbar region: Secondary | ICD-10-CM

## 2022-04-08 DIAGNOSIS — E7849 Other hyperlipidemia: Secondary | ICD-10-CM

## 2022-04-08 DIAGNOSIS — M47816 Spondylosis without myelopathy or radiculopathy, lumbar region: Secondary | ICD-10-CM | POA: Diagnosis not present

## 2022-04-08 MED ORDER — TIZANIDINE HCL 4 MG PO TABS: 4.0000 mg | ORAL_TABLET | Freq: Every day | ORAL | 1 refills | Status: DC

## 2022-04-08 MED ORDER — SIMVASTATIN 20 MG PO TABS: 20.0000 mg | ORAL_TABLET | Freq: Every day | ORAL | 1 refills | Status: DC

## 2022-04-08 NOTE — Progress Notes (Signed)
Date:  04/08/2022   Name:  April [name]   DOB:  [birth year]   MRN:  [id number]   Chief Complaint: Hyperlipidemia and muscle relaxer  Hyperlipidemia This is a chronic problem. The current episode started more than 1 year ago. The problem is controlled. Recent lipid tests were reviewed and are normal. She has no history of chronic renal disease, diabetes, liver disease or nephrotic syndrome. There are no known factors aggravating her hyperlipidemia. Pertinent negatives include no chest pain, focal sensory loss, focal weakness, leg pain, myalgias or shortness of breath. Current antihyperlipidemic treatment includes statins and diet change. The current treatment provides moderate improvement of lipids. There are no compliance problems.   Back Pain This is a chronic problem. The problem has been waxing and waning since onset. The pain is present in the lumbar spine. The quality of the pain is described as aching. The pain is Worse during the night. The symptoms are aggravated by position. Pertinent negatives include no abdominal pain, bladder incontinence, bowel incontinence, chest pain, dysuria, fever, headaches, leg pain or weakness.    Lab Results  Component Value Date   NA 141 07/24/2021   K 4.0 07/24/2021   CO2 22 05/14/2020   GLUCOSE 123 (H) 05/14/2020   BUN 17 07/24/2021   CREATININE 1.1 07/24/2021   CALCIUM 9.9 07/24/2021   EGFR 40 (L) 05/14/2020   GFRNONAA 51 (L) 09/24/2019   Lab Results  Component Value Date   CHOL 135 07/24/2021   HDL 44 07/24/2021   LDLCALC 72 07/21/2020   TRIG 99 07/24/2021   CHOLHDL 3.1 09/24/2019   Lab Results  Component Value Date   TSH 0.98 07/24/2021   Lab Results  Component Value Date   HGBA1C 8.0 09/16/2021   Lab Results  Component Value Date   WBC 7.4 08/29/2017   HGB 10.7 (L) 08/29/2017   HCT 32.6 (L) 08/29/2017   MCV 88.6 08/29/2017   PLT 284 08/29/2017   Lab Results  Component Value Date   ALT 9 07/21/2020   AST 19 07/21/2020    ALKPHOS 67 09/24/2019   BILITOT 0.4 09/24/2019   No results found for: "25OHVITD2", "25OHVITD3", "VD25OH"   Review of Systems  Constitutional: Negative.  Negative for chills, fatigue, fever and unexpected weight change.  HENT:  Negative for congestion, ear discharge, ear pain, rhinorrhea, sinus pressure, sneezing and sore throat.   Respiratory:  Negative for cough, shortness of breath, wheezing and stridor.   Cardiovascular:  Negative for chest pain.  Gastrointestinal:  Negative for abdominal pain, blood in stool, bowel incontinence, constipation, diarrhea and nausea.  Genitourinary:  Negative for bladder incontinence, dysuria, flank pain, frequency, hematuria, urgency and vaginal discharge.  Musculoskeletal:  Positive for back pain. Negative for arthralgias and myalgias.  Skin:  Negative for rash.  Neurological:  Negative for dizziness, focal weakness, weakness and headaches.  Hematological:  Negative for adenopathy. Does not bruise/bleed easily.  Psychiatric/Behavioral:  Negative for dysphoric mood. The patient is not nervous/anxious.     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   Essential hypertension 06/20/2014   Routine sports examination 06/20/2014   Health examination of defined subpopulation 06/20/2014   Hyperlipidemia due to type 2 diabetes mellitus ([place]) 06/20/2014    No Known Allergies  Past  Surgical History:  Procedure Laterality Date   APPENDECTOMY     gallstones  06/12/2009    Social History   Tobacco Use   Smoking status: Former   Smokeless tobacco: Never  Scientific laboratory technician Use: Never used  Substance Use Topics   Alcohol use: No    Alcohol/week: 0.0 standard drinks of alcohol   Drug use: No      Medication list has been reviewed and updated.  Current Meds  Medication Sig   amLODipine (NORVASC) 10 MG tablet Take 10 mg by mouth daily.   aspirin 81 MG tablet Take 1 tablet by mouth daily.   carvedilol (COREG) 3.125 MG tablet Take 1 tablet by mouth 2 (two) times daily. cardio   colchicine 0.6 MG tablet TAKE 1 TABLET(0.6 MG) BY MOUTH DAILY   glucose blood (ACCU-CHEK AVIVA PLUS) test strip USE TWICE DAILY AS DIRECTED   lisinopril-hydrochlorothiazide (ZESTORETIC) 20-25 MG tablet Take 1 tablet by mouth daily. cardio   metFORMIN (GLUCOPHAGE) 500 MG tablet TAKE 1 TABLET TWICE DAILY (Patient taking differently: 500 mg. TAKE 1 TABLETin morning and 2 at night/ Dr Honor Junes)   simvastatin (ZOCOR) 20 MG tablet Take 1 tablet (20 mg total) by mouth daily.   tiZANidine (ZANAFLEX) 4 MG tablet Take 1 tablet (4 mg total) by mouth at bedtime.   Current Facility-Administered Medications for the 04/08/22 encounter (Office Visit) with Juline Patch, MD  Medication   albuterol (PROVENTIL) (2.5 MG/3ML) 0.083% nebulizer solution 2.5 mg       04/08/2022   10:24 AM 11/30/2021    8:54 AM 11/03/2021    8:41 AM 10/14/2021   11:36 AM  GAD 7 : Generalized Anxiety Score  Nervous, Anxious, on Edge 0 0 0 0  Control/stop worrying 0 0 0 0  Worry too much - different things 0 0 0 0  Trouble relaxing 0 0 0 0  Restless 0 0 0 0  Easily annoyed or irritable 0 0 0 0  Afraid - awful might happen 0 0 0 0  Total GAD 7 Score 0 0 0 0  Anxiety Difficulty Not difficult at all Not difficult at all Not difficult at all Not difficult at all       04/08/2022   10:24 AM 11/30/2021    8:53 AM 11/03/2021    8:41 AM  Depression screen PHQ 2/9  Decreased Interest 0 0 0  Down, Depressed, Hopeless 0 0 0  PHQ - 2 Score 0 0 0  Altered sleeping 0 0 0  Tired, decreased energy 0 0 0  Change in appetite 0 0 0  Feeling bad or failure about yourself  0 0 0  Trouble concentrating 0 0 0  Moving slowly or fidgety/restless 0 0 0   Suicidal thoughts 0 0 0  PHQ-9 Score 0 0 0  Difficult doing work/chores Not difficult at all Not difficult at all Not difficult at all    BP Readings from Last 3 Encounters:  11/30/21 127/76  11/03/21 130/80  10/14/21 138/80    Physical Exam Vitals and nursing note reviewed. Exam conducted with a chaperone present.  Constitutional:      General: She is not in acute distress.    Appearance: She is not diaphoretic.  HENT:     Head: Normocephalic and atraumatic.     Right Ear: Tympanic membrane and external ear normal.     Left Ear: Tympanic membrane and external ear normal.     Nose: Nose normal.  Eyes:     General:        Right eye: No discharge.        Left eye: No discharge.     Conjunctiva/sclera: Conjunctivae normal.     Pupils: Pupils are equal, round, and reactive to light.  Neck:     Thyroid: No thyromegaly.     Vascular: No JVD.  Cardiovascular:     Rate and Rhythm: Normal rate and regular rhythm.     Heart sounds: Normal heart sounds. No murmur heard.    No friction rub. No gallop.  Pulmonary:     Effort: Pulmonary effort is normal.     Breath sounds: Normal breath sounds. No wheezing or rhonchi.  Abdominal:     General: Bowel sounds are normal.     Palpations: Abdomen is soft. There is no mass.     Tenderness: There is no abdominal tenderness. There is no guarding.  Musculoskeletal:        General: Normal range of motion.     Cervical back: Normal range of motion and neck supple.  Lymphadenopathy:     Cervical: No cervical adenopathy.  Skin:    General: Skin is warm and dry.  Neurological:     General: No focal deficit present.     Mental Status: She is alert.     Sensory: No sensory deficit.     Motor: No weakness.     Deep Tendon Reflexes: Reflexes are normal and symmetric.     Reflex Scores:      Patellar reflexes are 2+ on the right side and 2+ on the left side.    Wt Readings from Last 3 Encounters:  04/08/22 153 lb (69.4 kg)  11/30/21 158  lb (71.7 kg)  11/03/21 157 lb (71.2 kg)    Ht '5\' 6"'$  (1.676 m)   Wt 153 lb (69.4 kg)   BMI 24.69 kg/m   Assessment and Plan:  1. Familial multiple lipoprotein-type hyperlipidemia Chronic.  Controlled.  Stable.  Continue simvastatin 20 mg once a day.  Review of previous lipid panels are acceptable.  Will recheck in 6 months. - simvastatin (ZOCOR) 20 MG tablet; Take 1 tablet (20 mg total) by mouth daily.  Dispense: 90 tablet; Refill: 1  2. Lumbar degenerative disc disease .  Controlled.  Stable.  Patient is doing well on tizanidine 4 mg at night. 3. Lumbar facet arthropathy Chronic.  Controlled.  Stable.  As long as patient takes her tizanidine at night she is able to become comfortable in her positioning when she is lying supine and will continue this at this time.  And recheck on an as-needed basis. - tiZANidine (ZANAFLEX) 4 MG tablet; Take 1 tablet (4 mg total) by mouth at bedtime.  Dispense: 90 tablet; Refill: 1    Otilio Miu, MD

## 2022-04-13 DIAGNOSIS — E119 Type 2 diabetes mellitus without complications: Secondary | ICD-10-CM | POA: Diagnosis not present

## 2022-04-13 LAB — HEMOGLOBIN A1C: Hemoglobin A1C: 6.6

## 2022-04-26 DIAGNOSIS — I10 Essential (primary) hypertension: Secondary | ICD-10-CM | POA: Diagnosis not present

## 2022-04-27 ENCOUNTER — Encounter: Payer: Self-pay | Admitting: Family Medicine

## 2022-04-27 ENCOUNTER — Encounter: Payer: Medicare HMO | Admitting: Family Medicine

## 2022-04-28 ENCOUNTER — Telehealth: Payer: Self-pay | Admitting: Family Medicine

## 2022-04-28 NOTE — Telephone Encounter (Signed)
Copied from [place] [id number]. Topic: General - Other >> Apr 28, 2022 10:57 AM Sabas Sous wrote: Reason for CRM: Pt called requesting to speak to Baxter Flattery, please advise. Says this is regarding the last visit she had.

## 2022-04-30 ENCOUNTER — Encounter: Payer: Self-pay | Admitting: Internal Medicine

## 2022-04-30 ENCOUNTER — Ambulatory Visit (INDEPENDENT_AMBULATORY_CARE_PROVIDER_SITE_OTHER): Payer: Medicare HMO | Admitting: Internal Medicine

## 2022-04-30 VITALS — BP 136/62 | HR 75 | Temp 97.3°F | Ht 66.0 in | Wt 148.0 lb

## 2022-04-30 DIAGNOSIS — Z024 Encounter for examination for driving license: Secondary | ICD-10-CM

## 2022-04-30 NOTE — Progress Notes (Signed)
Games developer Medical Examination   April [name] is a 82 y.o. female who presents today for a commercial driver fitness determination physical exam. The patient reports no problems. The following portions of the patient's history were reviewed and updated as appropriate: allergies, current medications, past family history, past medical history, past social history, past surgical history, and problem list. Review of Systems  Past Medical History:  Diagnosis Date   Diabetes mellitus without complication ([place])    Gout    Hyperlipidemia    Hypertension     Current Outpatient Medications  Medication Sig Dispense Refill   amLODipine (NORVASC) 10 MG tablet Take 10 mg by mouth daily.     aspirin 81 MG tablet Take 1 tablet by mouth daily.     carvedilol (COREG) 3.125 MG tablet Take 1 tablet by mouth 2 (two) times daily. cardio     colchicine 0.6 MG tablet TAKE 1 TABLET(0.6 MG) BY MOUTH DAILY 10 tablet 1   diltiazem (DILACOR XR) 240 MG 24 hr capsule Take 1 capsule (240 mg total) by mouth daily. (Patient not taking: Reported on 04/08/2022) 90 capsule 1   glucose blood (ACCU-CHEK AVIVA PLUS) test strip USE TWICE DAILY AS DIRECTED     lisinopril-hydrochlorothiazide (ZESTORETIC) 20-25 MG tablet Take 1 tablet by mouth daily. cardio     metFORMIN (GLUCOPHAGE) 500 MG tablet TAKE 1 TABLET TWICE DAILY (Patient taking differently: 500 mg. TAKE 1 TABLETin morning and 2 at night/ Dr Honor Junes) 180 tablet 1   simvastatin (ZOCOR) 20 MG tablet Take 1 tablet (20 mg total) by mouth daily. 90 tablet 1   tiZANidine (ZANAFLEX) 4 MG tablet Take 1 tablet (4 mg total) by mouth at bedtime. 90 tablet 1   Current Facility-Administered Medications  Medication Dose Route Frequency Provider Last Rate Last Admin   albuterol (PROVENTIL) (2.5 MG/3ML) 0.083% nebulizer solution 2.5 mg  2.5 mg Nebulization Once Juline Patch, MD        No Known Allergies  Family History  Problem Relation Age of Onset   Breast cancer  Neg Hx     Social History   Socioeconomic History   Marital status: Widowed    Spouse name: Not on file   Number of children: Not on file   Years of education: Not on file   Highest education level: Not on file  Occupational History   Not on file  Tobacco Use   Smoking status: Former   Smokeless tobacco: Never  Vaping Use   Vaping Use: Never used  Substance and Sexual Activity   Alcohol use: No    Alcohol/week: 0.0 standard drinks of alcohol   Drug use: No   Sexual activity: Not Currently  Other Topics Concern   Not on file  Social History Narrative   Pt lives with her son and his family   Social Determinants of Health   Financial Resource Strain: Low Risk  (07/13/2021)   Overall Financial Resource Strain (CARDIA)    Difficulty of Paying Living Expenses: Not hard at all  Food Insecurity: No Food Insecurity (07/13/2021)   Hunger Vital Sign    Worried About Running Out of Food in the Last Year: Never true    [place] in the Last Year: Never true  Transportation Needs: No Transportation Needs (07/13/2021)   PRAPARE - Hydrologist (Medical): No    Lack of Transportation (Non-Medical): No  Physical Activity: Sufficiently Active (07/13/2021)   Exercise Vital Sign  Days of Exercise per Week: 5 days    Minutes of Exercise per Session: 30 min  Stress: No Stress Concern Present (07/13/2021)   [place]    Feeling of Stress : Not at all  Social Connections: Moderately Isolated (07/13/2021)   Social Connection and Isolation Panel [NHANES]    Frequency of Communication with Friends and Family: More than three times a week    Frequency of Social Gatherings with Friends and Family: More than three times a week    Attends Religious Services: More than 4 times per year    Active Member of [employer] or Organizations: No    Attends Archivist Meetings: Never    Marital Status: Widowed   Intimate Partner Violence: Not At Risk (07/13/2021)   Humiliation, Afraid, Rape, and Kick questionnaire    Fear of Current or Ex-Partner: No    Emotionally Abused: No    Physically Abused: No    Sexually Abused: No     Constitutional: Denies fever, malaise, fatigue, headache or abrupt weight changes.  HEENT: Denies eye pain, eye redness, ear pain, ringing in the ears, wax buildup, runny nose, nasal congestion, bloody nose, or sore throat. Respiratory: Denies difficulty breathing, shortness of breath, cough or sputum production.   Cardiovascular: Denies chest pain, chest tightness, palpitations or swelling in the hands or feet.  Gastrointestinal: Denies abdominal pain, bloating, constipation, diarrhea or blood in the stool.  GU: Denies urgency, frequency, pain with urination, burning sensation, blood in urine, odor or discharge. Musculoskeletal: Denies decrease in range of motion, difficulty with gait, muscle pain or joint pain and swelling.  Skin: Denies redness, rashes, lesions or ulcercations.  Neurological: Denies dizziness, difficulty with memory, difficulty with speech or problems with balance and coordination.  Psych: Denies anxiety, depression, SI/HI.  No other specific complaints in a complete review of systems (except as listed in HPI above).   Objective:    Vision:  Uncorrected Corrected Horizontal Field of Vision  Right Eye 20/25  70 degrees  Left Eye  20/30  70 degrees  Both Eyes  [id number]     Applicant can recognize and distinguish among traffic control signals and devices showing standard red, green, and amber colors.     Monocular Vision?: No   Hearing:        Right Ear  5 ft     Left Ear  5 ft        BP 136/62 (BP Location: Left Arm, Patient Position: Sitting, Cuff Size: Normal)   Pulse 75   Temp (!) 97.3 F (36.3 C) (Temporal)   Ht 5\' 6"  (1.676 m)   Wt 148 lb (67.1 kg)   SpO2 99%   BMI 23.89 kg/m   Wt Readings from Last 3 Encounters:  04/27/22 151  lb (68.5 kg)  04/08/22 153 lb (69.4 kg)  11/30/21 158 lb (71.7 kg)    General: Appears her stated age, well developed, well nourished in NAD. Skin: Warm, dry and intact. No rashes, lesions or ulcerations noted. HEENT: Head: normal shape and size; Eyes: sclera white, no icterus, conjunctiva pink, PERRLA and EOMs intact; Ears: Tm's gray and intact, normal light reflex; Nose: mucosa pink and moist, septum midline; Throat/Mouth: Teeth present, mucosa pink and moist, no exudate, lesions or ulcerations noted.  Neck:  Neck supple, trachea midline. No masses, lumps or thyromegaly present.  Cardiovascular: Normal rate and rhythm. S1,S2 noted.  Murmur noted. No JVD or BLE edema. No  carotid bruits noted. Pulmonary/Chest: Normal effort and positive vesicular breath sounds. No respiratory distress. No wheezes, rales or ronchi noted.  Abdomen: Soft and nontender. Normal bowel sounds. No distention or masses noted. Liver, spleen and kidneys non palpable. Musculoskeletal: Normal flexion and rotation of the spine.  Decreased extension of the cervical spine.  Normal internal and external rotation of the shoulders.  Shoulder shrug equal.  Normal abduction, adduction, internal and external rotation of bilateral hips.  Normal flexion extension of bilateral knees.  Strength 5/5 BUE/BLE.  No signs of joint swelling.  Able to stand on tiptoes and heels.  No difficulty with gait.  Neurological: Alert and oriented. Cranial nerves II-XII grossly intact. Coordination normal.  Psychiatric: Mood and affect normal. Behavior is normal. Judgment and thought content normal.     BMET    Component Value Date/Time   NA 141 07/24/2021 0000   K 4.0 07/24/2021 0000   CL 100 05/14/2020 1151   CO2 22 05/14/2020 1151   GLUCOSE 123 (H) 05/14/2020 1151   GLUCOSE 133 (H) 08/29/2017 1237   BUN 17 07/24/2021 0000   CREATININE 1.1 07/24/2021 0000   CREATININE 1.33 (H) 05/14/2020 1151   CALCIUM 9.9 07/24/2021 0000   GFRNONAA 51 (L)  09/24/2019 1023   GFRAA 59 (L) 09/24/2019 1023    Lipid Panel     Component Value Date/Time   CHOL 135 07/24/2021 0000   CHOL 142 09/24/2019 1023   TRIG 99 07/24/2021 0000   HDL 44 07/24/2021 0000   HDL 46 09/24/2019 1023   CHOLHDL 3.1 09/24/2019 1023   LDLCALC 72 07/21/2020 0000   LDLCALC 73 09/24/2019 1023    CBC    Component Value Date/Time   WBC 7.4 08/29/2017 1237   RBC 3.68 (L) 08/29/2017 1237   HGB 10.7 (L) 08/29/2017 1237   HCT 32.6 (L) 08/29/2017 1237   PLT 284 08/29/2017 1237   MCV 88.6 08/29/2017 1237   MCH 29.2 08/29/2017 1237   MCHC 32.9 08/29/2017 1237   RDW 14.1 08/29/2017 1237   LYMPHSABS 2.5 08/29/2017 1237   MONOABS 0.4 08/29/2017 1237   EOSABS 0.2 08/29/2017 1237   BASOSABS 0.1 08/29/2017 1237    Hgb A1C Lab Results  Component Value Date   HGBA1C 8.0 09/16/2021        Labs:  Urinalysis:   Specific Gravity: 1.005  Blood: Negative  Protein: Negative  Glucose: Negative  Assessment:    Healthy female exam.  Meets standards, but periodic monitoring required due to HTN. DM2.  Driver qualified only for 1 year.    Plan:    Medical examiners certificate completed and printed. Return as needed.   Webb Silversmith, NP

## 2022-05-04 DIAGNOSIS — E119 Type 2 diabetes mellitus without complications: Secondary | ICD-10-CM | POA: Diagnosis not present

## 2022-05-04 DIAGNOSIS — I10 Essential (primary) hypertension: Secondary | ICD-10-CM | POA: Diagnosis not present

## 2022-05-04 DIAGNOSIS — E782 Mixed hyperlipidemia: Secondary | ICD-10-CM | POA: Diagnosis not present

## 2022-05-04 DIAGNOSIS — R001 Bradycardia, unspecified: Secondary | ICD-10-CM | POA: Diagnosis not present

## 2022-05-14 NOTE — Progress Notes (Signed)
Pt was suppose to go to cardio today, not come here- pt not seen

## 2022-06-01 ENCOUNTER — Encounter: Payer: Self-pay | Admitting: Family Medicine

## 2022-06-01 ENCOUNTER — Ambulatory Visit (INDEPENDENT_AMBULATORY_CARE_PROVIDER_SITE_OTHER): Payer: Medicare HMO | Admitting: Family Medicine

## 2022-06-01 VITALS — BP 128/60 | HR 85 | Ht 66.0 in | Wt 151.0 lb

## 2022-06-01 DIAGNOSIS — M109 Gout, unspecified: Secondary | ICD-10-CM | POA: Diagnosis not present

## 2022-06-01 DIAGNOSIS — M5136 Other intervertebral disc degeneration, lumbar region: Secondary | ICD-10-CM

## 2022-06-01 DIAGNOSIS — E7849 Other hyperlipidemia: Secondary | ICD-10-CM | POA: Diagnosis not present

## 2022-06-01 DIAGNOSIS — M47816 Spondylosis without myelopathy or radiculopathy, lumbar region: Secondary | ICD-10-CM

## 2022-06-01 DIAGNOSIS — I10 Essential (primary) hypertension: Secondary | ICD-10-CM | POA: Diagnosis not present

## 2022-06-01 MED ORDER — TIZANIDINE HCL 4 MG PO TABS: 4.0000 mg | ORAL_TABLET | Freq: Every day | ORAL | 1 refills | Status: DC

## 2022-06-01 MED ORDER — SIMVASTATIN 20 MG PO TABS: 20.0000 mg | ORAL_TABLET | Freq: Every day | ORAL | 1 refills | Status: DC

## 2022-06-01 MED ORDER — COLCHICINE 0.6 MG PO TABS: ORAL_TABLET | ORAL | 1 refills | Status: DC

## 2022-06-01 NOTE — Progress Notes (Signed)
Date:  06/01/2022   Name:  April [name]   DOB:  07-22-1940   MRN:  [id number]   Chief Complaint: Gout, Hyperlipidemia, and Spasms  Hyperlipidemia This is a chronic problem. The current episode started more than 1 year ago. The problem is controlled. Recent lipid tests were reviewed and are normal. She has no history of diabetes, hypothyroidism, liver disease, obesity or nephrotic syndrome. There are no known factors aggravating her hyperlipidemia. Pertinent negatives include no leg pain, myalgias or shortness of breath. Current antihyperlipidemic treatment includes statins. The current treatment provides moderate improvement of lipids. There are no compliance problems.  Risk factors for coronary artery disease include dyslipidemia and hypertension.  Back Pain This is a chronic problem. The current episode started more than 1 year ago. The problem has been waxing and waning since onset. The pain is present in the lumbar spine. The pain does not radiate. Stiffness is present At night. Pertinent negatives include no abdominal pain, dysuria, fever, headaches, leg pain or weakness.    Lab Results  Component Value Date   NA 141 07/24/2021   K 4.0 07/24/2021   CO2 22 05/14/2020   GLUCOSE 123 (H) 05/14/2020   BUN 17 07/24/2021   CREATININE 1.1 07/24/2021   CALCIUM 9.9 07/24/2021   EGFR 40 (L) 05/14/2020   GFRNONAA 51 (L) 09/24/2019   Lab Results  Component Value Date   CHOL 135 07/24/2021   HDL 44 07/24/2021   LDLCALC 72 07/21/2020   TRIG 99 07/24/2021   CHOLHDL 3.1 09/24/2019   Lab Results  Component Value Date   TSH 0.98 07/24/2021   Lab Results  Component Value Date   HGBA1C 6.6 04/13/2022   Lab Results  Component Value Date   WBC 7.4 08/29/2017   HGB 10.7 (L) 08/29/2017   HCT 32.6 (L) 08/29/2017   MCV 88.6 08/29/2017   PLT 284 08/29/2017   Lab Results  Component Value Date   ALT 9 07/21/2020   AST 19 07/21/2020   ALKPHOS 67 09/24/2019   BILITOT 0.4 09/24/2019    No results found for: "25OHVITD2", "25OHVITD3", "VD25OH"   Review of Systems  Constitutional: Negative.  Negative for chills, fatigue, fever and unexpected weight change.  HENT:  Negative for congestion, ear discharge, ear pain, rhinorrhea, sinus pressure, sneezing and sore throat.   Respiratory:  Negative for cough, shortness of breath, wheezing and stridor.   Gastrointestinal:  Negative for abdominal pain, blood in stool, constipation, diarrhea and nausea.  Genitourinary:  Negative for dysuria, flank pain, frequency, hematuria, urgency and vaginal discharge.  Musculoskeletal:  Positive for back pain. Negative for arthralgias and myalgias.  Skin:  Negative for rash.  Neurological:  Negative for dizziness, weakness and headaches.  Hematological:  Negative for adenopathy. Does not bruise/bleed easily.  Psychiatric/Behavioral:  Negative for dysphoric mood. The patient is not nervous/anxious.     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   Alimentary obesity 06/20/2014   Essential hypertension 06/20/2014   Routine sports examination 06/20/2014   Health examination of defined subpopulation 06/20/2014   Hyperlipidemia due to type 2 diabetes mellitus 06/20/2014    No Known Allergies  Past Surgical History:  Procedure Laterality Date   APPENDECTOMY  gallstones  06/12/2009    Social History   Tobacco Use   Smoking status: Former   Smokeless tobacco: Never  Building services engineer Use: Never used  Substance Use Topics   Alcohol use: No    Alcohol/week: 0.0 standard drinks of alcohol   Drug use: No     Medication list has been reviewed and updated.  Current Meds   Medication Sig   amLODipine (NORVASC) 10 MG tablet Take 10 mg by mouth daily.   aspirin 81 MG tablet Take 1 tablet by mouth daily.   carvedilol (COREG) 3.125 MG tablet Take 1 tablet by mouth 2 (two) times daily. cardio   colchicine 0.6 MG tablet TAKE 1 TABLET(0.6 MG) BY MOUTH DAILY   diltiazem (DILACOR XR) 240 MG 24 hr capsule Take 1 capsule (240 mg total) by mouth daily.   glucose blood (ACCU-CHEK AVIVA PLUS) test strip USE TWICE DAILY AS DIRECTED   lisinopril-hydrochlorothiazide (ZESTORETIC) 20-25 MG tablet Take 1 tablet by mouth daily. cardio   metFORMIN (GLUCOPHAGE) 500 MG tablet TAKE 1 TABLET TWICE DAILY (Patient taking differently: 500 mg. TAKE 1 TABLETin morning and 2 at night/ Dr Gershon Crane)   simvastatin (ZOCOR) 20 MG tablet Take 1 tablet (20 mg total) by mouth daily.   tiZANidine (ZANAFLEX) 4 MG tablet Take 1 tablet (4 mg total) by mouth at bedtime.   Current Facility-Administered Medications for the 06/01/22 encounter (Office Visit) with [name], MD  Medication   albuterol (PROVENTIL) (2.5 MG/3ML) 0.083% nebulizer solution 2.5 mg       06/01/2022    9:45 AM 04/08/2022   10:24 AM 11/30/2021    8:54 AM 11/03/2021    8:41 AM  GAD 7 : Generalized Anxiety Score  Nervous, Anxious, on Edge 0 0 0 0  Control/stop worrying 0 0 0 0  Worry too much - different things 0 0 0 0  Trouble relaxing 0 0 0 0  Restless 0 0 0 0  Easily annoyed or irritable 0 0 0 0  Afraid - awful might happen 0 0 0 0  Total GAD 7 Score 0 0 0 0  Anxiety Difficulty Not difficult at all Not difficult at all Not difficult at all Not difficult at all       06/01/2022    9:45 AM 04/08/2022   10:24 AM 11/30/2021    8:53 AM  Depression screen PHQ 2/9  Decreased Interest 0 0 0  Down, Depressed, Hopeless 0 0 0  PHQ - 2 Score 0 0 0  Altered sleeping 0 0 0  Tired, decreased energy 0 0 0  Change in appetite 0 0 0  Feeling bad or failure about yourself  0 0 0  Trouble concentrating 0 0 0  Moving slowly or  fidgety/restless 0 0 0  Suicidal thoughts 0 0 0  PHQ-9 Score 0 0 0  Difficult doing work/chores Not difficult at all Not difficult at all Not difficult at all    BP Readings from Last 3 Encounters:  06/01/22 128/60  04/30/22 136/62  04/27/22 128/60    Physical Exam Vitals and nursing note reviewed. Exam conducted with a chaperone present.  Constitutional:      General: She is not in acute distress.    Appearance: She is not diaphoretic.  HENT:     Head: Normocephalic and atraumatic.     Right Ear: External ear normal.     Left Ear: External ear normal.     Nose: Nose normal.  Eyes:     General:        Right eye: No discharge.        Left eye: No discharge.     Conjunctiva/sclera: Conjunctivae normal.     Pupils: Pupils are equal, round, and reactive to light.  Neck:     Thyroid: No thyromegaly.     Vascular: No JVD.  Cardiovascular:     Rate and Rhythm: Normal rate and regular rhythm.     Heart sounds: Normal heart sounds. No murmur heard.    No friction rub. No gallop.  Pulmonary:     Effort: Pulmonary effort is normal.     Breath sounds: Normal breath sounds.  Abdominal:     General: Bowel sounds are normal.     Palpations: Abdomen is soft. There is no mass.     Tenderness: There is no abdominal tenderness. There is no guarding.  Musculoskeletal:        General: Normal range of motion.     Cervical back: Normal range of motion and neck supple.  Lymphadenopathy:     Cervical: No cervical adenopathy.  Skin:    General: Skin is warm and dry.  Neurological:     Mental Status: She is alert.     Deep Tendon Reflexes:     Reflex Scores:      Patellar reflexes are 2+ on the right side and 2+ on the left side.    Wt Readings from Last 3 Encounters:  06/01/22 151 lb (68.5 kg)  04/30/22 148 lb (67.1 kg)  04/27/22 151 lb (68.5 kg)    BP 128/60   Pulse 85   Ht 5\' 6"  (1.676 m)   Wt 151 lb (68.5 kg)   SpO2 98%   BMI 24.37 kg/m   Assessment and Plan: 1.  Familial multiple lipoprotein-type hyperlipidemia Chronic.  Controlled.  Stable.  Continue simvastatin 20 mg once a day.  Will check lipid panel current level of LDL control and CMP for hepatic tolerance. - simvastatin (ZOCOR) 20 MG tablet; Take 1 tablet (20 mg total) by mouth daily.  Dispense: 90 tablet; Refill: 1 - Lipid Panel With LDL/HDL Ratio - Comprehensive Metabolic Panel (CMET)  2. Essential hypertension Chronic.  Controlled.  Stable.  Blood pressure today is 128/60.  Currently is on diltiazem, amlodipine, and lisinopril hydrochlorothiazide. - Comprehensive Metabolic Panel (CMET)  3. Acute gout of left foot, unspecified cause Patient has acute episodes of gout which patient would like to have colchicine on board on a as needed basis when an acute episode happens. - colchicine 0.6 MG tablet; TAKE 1 TABLET(0.6 MG) BY MOUTH DAILY  Dispense: 10 tablet; Refill: 1  4. Lumbar facet arthropathy Chronic.  Controlled.  Stable.  Only takes tizanidine at night and there is more stiffness in we will continue at 4 mg nightly. - tiZANidine (ZANAFLEX) 4 MG tablet; Take 1 tablet (4 mg total) by mouth at bedtime.  Dispense: 90 tablet; Refill: 1  5. Lumbar degenerative disc disease As noted above. - tiZANidine (ZANAFLEX) 4 MG tablet; Take 1 tablet (4 mg total) by mouth at bedtime.  Dispense: 90 tablet; Refill: 1     [name], MD

## 2022-06-02 LAB — LIPID PANEL WITH LDL/HDL RATIO
Cholesterol, Total: 149 mg/dL (ref 100–199)
HDL: 59 mg/dL (ref >39)
LDL Chol Calc (NIH): 74 mg/dL (ref 0–99)
LDL/HDL Ratio: 1.3 ratio (ref 0.0–3.2)
Triglycerides: 85 mg/dL (ref 0–149)
VLDL Cholesterol Cal: 16 mg/dL (ref 5–40)

## 2022-06-02 LAB — COMPREHENSIVE METABOLIC PANEL
ALT: 7 IU/L (ref 0–32)
AST: 15 IU/L (ref 0–40)
Albumin/Globulin Ratio: 1.5 (ref 1.2–2.2)
Albumin: 4.2 g/dL (ref 3.7–4.7)
Alkaline Phosphatase: 66 IU/L (ref 44–121)
BUN/Creatinine Ratio: 13 (ref 12–28)
BUN: 15 mg/dL (ref 8–27)
Bilirubin Total: 0.4 mg/dL (ref 0.0–1.2)
CO2: 22 mmol/L (ref 20–29)
Calcium: 9.7 mg/dL (ref 8.7–10.3)
Chloride: 104 mmol/L (ref 96–106)
Creatinine, Ser: 1.14 mg/dL — ABNORMAL HIGH (ref 0.57–1.00)
Globulin, Total: 2.8 g/dL (ref 1.5–4.5)
Glucose: 200 mg/dL — ABNORMAL HIGH (ref 70–99)
Potassium: 4.5 mmol/L (ref 3.5–5.2)
Sodium: 141 mmol/L (ref 134–144)
Total Protein: 7 g/dL (ref 6.0–8.5)
eGFR: 48 mL/min/{1.73_m2} — ABNORMAL LOW (ref >59)

## 2022-06-30 ENCOUNTER — Telehealth: Payer: Self-pay | Admitting: Family Medicine

## 2022-06-30 NOTE — Telephone Encounter (Signed)
Copied from CRM [id number]. Topic: Medicare AWV >> Jun 30, 2022 11:34 AM Payton Doughty wrote: Reason for CRM: Called patient to schedule Medicare Annual Wellness Visit (AWV). Left message for patient to call back and schedule Medicare Annual Wellness Visit (AWV).   Please schedule an appointment at any time with Kennedy Bucker, LPN  .  If any questions, please contact me.  Thank you ,  Verlee Rossetti; Care Guide Ambulatory Clinical Support [place] [facility] Direct Dial: [phone]

## 2022-07-09 ENCOUNTER — Ambulatory Visit: Payer: Self-pay

## 2022-07-09 NOTE — Telephone Encounter (Signed)
Pt trying to get earlier appt today for scratchy throat, says just doesn't fell well. I offered appt today at 4:20. She says that's too late and wants to be worked in earlier.   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  Reason for Disposition  SEVERE (e.g., excruciating) throat pain  Answer Assessment - Initial Assessment Questions 1. ONSET: "When did the throat start hurting?" (Hours or days ago)      Last week 2. SEVERITY: "How bad is the sore throat?" (Scale 1-10; mild, moderate or severe)   - MILD (1-3):  Doesn't interfere with eating or normal activities.   - MODERATE (4-7): Interferes with eating some solids and normal activities.   - SEVERE (8-10):  Excruciating pain, interferes with most normal activities.   - SEVERE WITH DYSPHAGIA (10): Can't swallow liquids, drooling.     Moderate 3. STREP EXPOSURE: "Has there been any exposure to strep within the past week?" If Yes, ask: "What type of contact occurred?"      No 4.  VIRAL SYMPTOMS: "Are there any symptoms of a cold, such as a runny nose, cough, hoarse voice or red eyes?"      Runny nose 5. FEVER: "Do you have a fever?" If Yes, ask: "What is your temperature, how was it measured, and when did it start?"     No 6. PUS ON THE TONSILS: "Is there pus on the tonsils in the back of your throat?"     No 7. OTHER SYMPTOMS: "Do you have any other symptoms?" (e.g., difficulty breathing, headache, rash)     No 8. PREGNANCY: "Is there any chance you are pregnant?" "When was your last menstrual period?"      No  Protocols used: Sore Throat-A-AH

## 2022-07-12 ENCOUNTER — Encounter: Payer: Self-pay | Admitting: Family Medicine

## 2022-07-12 ENCOUNTER — Ambulatory Visit (INDEPENDENT_AMBULATORY_CARE_PROVIDER_SITE_OTHER): Payer: Medicare HMO | Admitting: Family Medicine

## 2022-07-12 VITALS — BP 120/60 | HR 74 | Ht 66.0 in | Wt 149.0 lb

## 2022-07-12 DIAGNOSIS — J0100 Acute maxillary sinusitis, unspecified: Secondary | ICD-10-CM

## 2022-07-12 MED ORDER — AMOXICILLIN 500 MG PO CAPS: 500.0000 mg | ORAL_CAPSULE | Freq: Three times a day (TID) | ORAL | 0 refills | Status: AC

## 2022-07-12 NOTE — Progress Notes (Signed)
Date:  07/12/2022   Name:  April [name]   DOB:  1940/02/27   MRN:  [id number]   Chief Complaint: Sore Throat (R) side throat with green production)  Sinusitis This is a new problem. The current episode started 1 to 4 weeks ago. The problem has been waxing and waning since onset. There has been no fever. Associated symptoms include congestion, sinus pressure and a sore throat. Pertinent negatives include no chills, coughing, diaphoresis, ear pain, headaches, hoarse voice, neck pain, shortness of breath, sneezing or swollen glands. Past treatments include nothing.    Lab Results  Component Value Date   NA 141 06/01/2022   K 4.5 06/01/2022   CO2 22 06/01/2022   GLUCOSE 200 (H) 06/01/2022   BUN 15 06/01/2022   CREATININE 1.14 (H) 06/01/2022   CALCIUM 9.7 06/01/2022   EGFR 48 (L) 06/01/2022   GFRNONAA 51 (L) 09/24/2019   Lab Results  Component Value Date   CHOL 149 06/01/2022   HDL 59 06/01/2022   LDLCALC 74 06/01/2022   TRIG 85 06/01/2022   CHOLHDL 3.1 09/24/2019   Lab Results  Component Value Date   TSH 0.98 07/24/2021   Lab Results  Component Value Date   HGBA1C 6.6 04/13/2022   Lab Results  Component Value Date   WBC 7.4 08/29/2017   HGB 10.7 (L) 08/29/2017   HCT 32.6 (L) 08/29/2017   MCV 88.6 08/29/2017   PLT 284 08/29/2017   Lab Results  Component Value Date   ALT 7 06/01/2022   AST 15 06/01/2022   ALKPHOS 66 06/01/2022   BILITOT 0.4 06/01/2022   No results found for: "25OHVITD2", "25OHVITD3", "VD25OH"   Review of Systems  Constitutional: Negative.  Negative for chills, diaphoresis, fatigue, fever and unexpected weight change.  HENT:  Positive for congestion, sinus pressure and sore throat. Negative for ear discharge, ear pain, hoarse voice, postnasal drip, rhinorrhea and sneezing.   Respiratory:  Negative for cough, chest tightness, shortness of breath, wheezing and stridor.   Cardiovascular:  Negative for chest pain, palpitations and leg swelling.   Gastrointestinal:  Negative for abdominal pain, blood in stool, constipation, diarrhea and nausea.  Endocrine: Negative for polydipsia.  Genitourinary:  Negative for dysuria, flank pain, frequency, hematuria, urgency and vaginal discharge.  Musculoskeletal:  Negative for arthralgias, back pain, myalgias and neck pain.  Skin:  Negative for rash.  Neurological:  Negative for dizziness, weakness and headaches.  Hematological:  Negative for adenopathy. Does not bruise/bleed easily.  Psychiatric/Behavioral:  Negative for dysphoric mood. The patient is not nervous/anxious.     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   Alimentary obesity 06/20/2014   Essential hypertension 06/20/2014   Routine sports examination 06/20/2014   Health examination of defined subpopulation 06/20/2014   Hyperlipidemia due to type 2 diabetes mellitus (HCC) 06/20/2014    No Known Allergies  Past Surgical History:  Procedure Laterality Date   APPENDECTOMY     gallstones  06/12/2009    Social History   Tobacco Use   Smoking status: Former   Smokeless tobacco: Never  Building services engineer Use: Never used  Substance Use Topics   Alcohol use: No    Alcohol/week: 0.0 standard drinks  of alcohol   Drug use: No     Medication list has been reviewed and updated.  Current Meds  Medication Sig   amLODipine (NORVASC) 10 MG tablet Take 10 mg by mouth daily.   aspirin 81 MG tablet Take 1 tablet by mouth daily.   carvedilol (COREG) 3.125 MG tablet Take 1 tablet by mouth 2 (two) times daily. cardio   colchicine 0.6 MG tablet TAKE 1 TABLET(0.6 MG) BY MOUTH DAILY   diltiazem (DILACOR XR) 240 MG 24 hr capsule  Take 1 capsule (240 mg total) by mouth daily.   glucose blood (ACCU-CHEK AVIVA PLUS) test strip USE TWICE DAILY AS DIRECTED   lisinopril-hydrochlorothiazide (ZESTORETIC) 20-25 MG tablet Take 1 tablet by mouth daily. cardio   metFORMIN (GLUCOPHAGE) 500 MG tablet TAKE 1 TABLET TWICE DAILY (Patient taking differently: 500 mg. TAKE 1 TABLETin morning and 2 at night/ Dr Gershon Crane)   simvastatin (ZOCOR) 20 MG tablet Take 1 tablet (20 mg total) by mouth daily.   tiZANidine (ZANAFLEX) 4 MG tablet Take 1 tablet (4 mg total) by mouth at bedtime.   Current Facility-Administered Medications for the 07/12/22 encounter (Office Visit) with [name], MD  Medication   albuterol (PROVENTIL) (2.5 MG/3ML) 0.083% nebulizer solution 2.5 mg       07/12/2022   10:38 AM 06/01/2022    9:45 AM 04/08/2022   10:24 AM 11/30/2021    8:54 AM  GAD 7 : Generalized Anxiety Score  Nervous, Anxious, on Edge 0 0 0 0  Control/stop worrying 0 0 0 0  Worry too much - different things 0 0 0 0  Trouble relaxing 0 0 0 0  Restless 0 0 0 0  Easily annoyed or irritable 0 0 0 0  Afraid - awful might happen 0 0 0 0  Total GAD 7 Score 0 0 0 0  Anxiety Difficulty Not difficult at all Not difficult at all Not difficult at all Not difficult at all       07/12/2022   10:38 AM 06/01/2022    9:45 AM 04/08/2022   10:24 AM  Depression screen PHQ 2/9  Decreased Interest 0 0 0  Down, Depressed, Hopeless 0 0 0  PHQ - 2 Score 0 0 0  Altered sleeping 0 0 0  Tired, decreased energy 0 0 0  Change in appetite 0 0 0  Feeling bad or failure about yourself  0 0 0  Trouble concentrating 0 0 0  Moving slowly or fidgety/restless 0 0 0  Suicidal thoughts 0 0 0  PHQ-9 Score 0 0 0  Difficult doing work/chores Not difficult at all Not difficult at all Not difficult at all    BP Readings from Last 3 Encounters:  07/12/22 120/60  06/01/22 128/60  04/30/22 136/62    Physical Exam Vitals and nursing note reviewed. Exam conducted with a  chaperone present.  Constitutional:      General: She is not in acute distress.    Appearance: She is not diaphoretic.  HENT:     Head: Normocephalic and atraumatic.     Right Ear: Tympanic membrane and external ear normal.     Left Ear: Tympanic membrane and external ear normal.     Nose:     Right Sinus: Maxillary sinus tenderness present. No frontal sinus tenderness.     Left Sinus: Maxillary sinus tenderness present. No frontal sinus tenderness.  Eyes:     General:        Right eye: No  discharge.        Left eye: No discharge.     Conjunctiva/sclera: Conjunctivae normal.     Pupils: Pupils are equal, round, and reactive to light.  Neck:     Thyroid: No thyromegaly.     Vascular: No JVD.  Cardiovascular:     Rate and Rhythm: Normal rate and regular rhythm.     Heart sounds: Normal heart sounds. No murmur heard.    No friction rub. No gallop.  Pulmonary:     Effort: Pulmonary effort is normal.     Breath sounds: Normal breath sounds. No decreased breath sounds, wheezing, rhonchi or rales.  Abdominal:     General: Bowel sounds are normal.     Palpations: Abdomen is soft. There is no mass.     Tenderness: There is no abdominal tenderness. There is no guarding.  Musculoskeletal:        General: Normal range of motion.     Cervical back: Normal range of motion and neck supple.  Lymphadenopathy:     Cervical: No cervical adenopathy.  Skin:    General: Skin is warm and dry.  Neurological:     Mental Status: She is alert.     Deep Tendon Reflexes: Reflexes are normal and symmetric.    Wt Readings from Last 3 Encounters:  07/12/22 149 lb (67.6 kg)  06/01/22 151 lb (68.5 kg)  04/30/22 148 lb (67.1 kg)    BP 120/60   Pulse 74   Ht 5\' 6"  (1.676 m)   Wt 149 lb (67.6 kg)   SpO2 96%   BMI 24.05 kg/m   Assessment and Plan: 1. Acute maxillary sinusitis, recurrence not specified New onset.  Persistent.  Stable.  Exam and history is consistent with maxillary sinus  infection.  We will treat with amoxicillin 500 mg 3 times a day for 10 days.  I have also suggested that patient take Mucinex DM for mucolytic action and suppression of cough.  Patient has been encouraged to return if not better.    [name], MD

## 2022-07-21 ENCOUNTER — Ambulatory Visit (INDEPENDENT_AMBULATORY_CARE_PROVIDER_SITE_OTHER): Payer: Medicare HMO

## 2022-07-21 VITALS — Ht 66.0 in | Wt 149.0 lb

## 2022-07-21 DIAGNOSIS — Z0000 Encounter for general adult medical examination without abnormal findings: Secondary | ICD-10-CM

## 2022-07-21 NOTE — Patient Instructions (Signed)
April [name] , Thank you for taking time to come for your Medicare Wellness Visit. I appreciate your ongoing commitment to your health goals. Please review the following plan we discussed and let me know if I can assist you in the future.   These are the goals we discussed:  Goals      DIET - EAT MORE FRUITS AND VEGETABLES     Patient Stated     Patient states she would like to lower A1c to less than 7% with healthy eating and physical activity         This is a list of the screening recommended for you and due dates:  Health Maintenance  Topic Date Due   Yearly kidney health urinalysis for diabetes  07/25/2022   Complete foot exam   07/25/2022   Eye exam for diabetics  08/07/2022   Flu Shot  09/09/2022   Hemoglobin A1C  10/14/2022   Mammogram  01/19/2023   Yearly kidney function blood test for diabetes  06/01/2023   Medicare Annual Wellness Visit  07/21/2023   Pneumonia Vaccine  Completed   DEXA scan (bone density measurement)  Completed   Zoster (Shingles) Vaccine  Completed   HPV Vaccine  Aged Out   DTaP/Tdap/Td vaccine  Discontinued   COVID-19 Vaccine  Discontinued    Advanced directives: no  Conditions/risks identified: none  Next appointment: Follow up in one year for your annual wellness visit 07/27/23 @ 8:45 am by phone   Preventive Care 65 Years and Older, Female Preventive care refers to lifestyle choices and visits with your health care provider that can promote health and wellness. What does preventive care include? A yearly physical exam. This is also called an annual well check. Dental exams once or twice a year. Routine eye exams. Ask your health care provider how often you should have your eyes checked. Personal lifestyle choices, including: Daily care of your teeth and gums. Regular physical activity. Eating a healthy diet. Avoiding tobacco and drug use. Limiting alcohol use. Practicing safe sex. Taking low-dose aspirin every day. Taking vitamin and  mineral supplements as recommended by your health care provider. What happens during an annual well check? The services and screenings done by your health care provider during your annual well check will depend on your age, overall health, lifestyle risk factors, and family history of disease. Counseling  Your health care provider may ask you questions about your: Alcohol use. Tobacco use. Drug use. Emotional well-being. Home and relationship well-being. Sexual activity. Eating habits. History of falls. Memory and ability to understand (cognition). Work and work Astronomer. Reproductive health. Screening  You may have the following tests or measurements: Height, weight, and BMI. Blood pressure. Lipid and cholesterol levels. These may be checked every 5 years, or more frequently if you are over 34 years old. Skin check. Lung cancer screening. You may have this screening every year starting at age 32 if you have a 30-pack-year history of smoking and currently smoke or have quit within the past 15 years. Fecal occult blood test (FOBT) of the stool. You may have this test every year starting at age 72. Flexible sigmoidoscopy or colonoscopy. You may have a sigmoidoscopy every 5 years or a colonoscopy every 10 years starting at age 62. Hepatitis C blood test. Hepatitis B blood test. Sexually transmitted disease (STD) testing. Diabetes screening. This is done by checking your blood sugar (glucose) after you have not eaten for a while (fasting). You may have this done every  1-3 years. Bone density scan. This is done to screen for osteoporosis. You may have this done starting at age 8. Mammogram. This may be done every 1-2 years. Talk to your health care provider about how often you should have regular mammograms. Talk with your health care provider about your test results, treatment options, and if necessary, the need for more tests. Vaccines  Your health care provider may recommend  certain vaccines, such as: Influenza vaccine. This is recommended every year. Tetanus, diphtheria, and acellular pertussis (Tdap, Td) vaccine. You may need a Td booster every 10 years. Zoster vaccine. You may need this after age 58. Pneumococcal 13-valent conjugate (PCV13) vaccine. One dose is recommended after age 83. Pneumococcal polysaccharide (PPSV23) vaccine. One dose is recommended after age 79. Talk to your health care provider about which screenings and vaccines you need and how often you need them. This information is not intended to replace advice given to you by your health care provider. Make sure you discuss any questions you have with your health care provider. Document Released: 02/21/2015 Document Revised: 10/15/2015 Document Reviewed: 11/26/2014 Elsevier Interactive Patient Education  2017 [employer] Prevention in the Home Falls can cause injuries. They can happen to people of all ages. There are many things you can do to make your home safe and to help prevent falls. What can I do on the outside of my home? Regularly fix the edges of walkways and driveways and fix any cracks. Remove anything that might make you trip as you walk through a door, such as a raised step or threshold. Trim any bushes or trees on the path to your home. Use bright outdoor lighting. Clear any walking paths of anything that might make someone trip, such as rocks or tools. Regularly check to see if handrails are loose or broken. Make sure that both sides of any steps have handrails. Any raised decks and porches should have guardrails on the edges. Have any leaves, snow, or ice cleared regularly. Use sand or salt on walking paths during winter. Clean up any spills in your garage right away. This includes oil or grease spills. What can I do in the bathroom? Use night lights. Install grab bars by the toilet and in the tub and shower. Do not use towel bars as grab bars. Use non-skid mats or  decals in the tub or shower. If you need to sit down in the shower, use a plastic, non-slip stool. Keep the floor dry. Clean up any water that spills on the floor as soon as it happens. Remove soap buildup in the tub or shower regularly. Attach bath mats securely with double-sided non-slip rug tape. Do not have throw rugs and other things on the floor that can make you trip. What can I do in the bedroom? Use night lights. Make sure that you have a light by your bed that is easy to reach. Do not use any sheets or blankets that are too big for your bed. They should not hang down onto the floor. Have a firm chair that has side arms. You can use this for support while you get dressed. Do not have throw rugs and other things on the floor that can make you trip. What can I do in the kitchen? Clean up any spills right away. Avoid walking on wet floors. Keep items that you use a lot in easy-to-reach places. If you need to reach something above you, use a strong step stool that has a  grab bar. Keep electrical cords out of the way. Do not use floor polish or wax that makes floors slippery. If you must use wax, use non-skid floor wax. Do not have throw rugs and other things on the floor that can make you trip. What can I do with my stairs? Do not leave any items on the stairs. Make sure that there are handrails on both sides of the stairs and use them. Fix handrails that are broken or loose. Make sure that handrails are as long as the stairways. Check any carpeting to make sure that it is firmly attached to the stairs. Fix any carpet that is loose or worn. Avoid having throw rugs at the top or bottom of the stairs. If you do have throw rugs, attach them to the floor with carpet tape. Make sure that you have a light switch at the top of the stairs and the bottom of the stairs. If you do not have them, ask someone to add them for you. What else can I do to help prevent falls? Wear shoes that: Do not  have high heels. Have rubber bottoms. Are comfortable and fit you well. Are closed at the toe. Do not wear sandals. If you use a stepladder: Make sure that it is fully opened. Do not climb a closed stepladder. Make sure that both sides of the stepladder are locked into place. Ask someone to hold it for you, if possible. Clearly mark and make sure that you can see: Any grab bars or handrails. First and last steps. Where the edge of each step is. Use tools that help you move around (mobility aids) if they are needed. These include: Canes. Walkers. Scooters. Crutches. Turn on the lights when you go into a dark area. Replace any light bulbs as soon as they burn out. Set up your furniture so you have a clear path. Avoid moving your furniture around. If any of your floors are uneven, fix them. If there are any pets around you, be aware of where they are. Review your medicines with your doctor. Some medicines can make you feel dizzy. This can increase your chance of falling. Ask your doctor what other things that you can do to help prevent falls. This information is not intended to replace advice given to you by your health care provider. Make sure you discuss any questions you have with your health care provider. Document Released: 11/21/2008 Document Revised: 07/03/2015 Document Reviewed: 03/01/2014 Elsevier Interactive Patient Education  2017 [employer].

## 2022-07-21 NOTE — Progress Notes (Signed)
I connected with  April [name] on 07/21/22 by a audio enabled telemedicine application and verified that I am speaking with the correct person using two identifiers.  Patient Location: Home  Provider Location: Office/Clinic  I discussed the limitations of evaluation and management by telemedicine. The patient expressed understanding and agreed to proceed.  Subjective:   April [name] is a 82 y.o. female who presents for Medicare Annual (Subsequent) preventive examination.  Review of Systems     Cardiac Risk Factors include: advanced age (>20men, >50 women);diabetes mellitus;dyslipidemia;hypertension     Objective:    Today's Vitals   07/21/22 0912  Weight: 149 lb (67.6 kg)  Height: 5\' 6"  (1.676 m)   Body mass index is 24.05 kg/m.     07/21/2022    9:03 AM 07/13/2021    8:55 AM 07/09/2020    9:39 AM 07/04/2019    9:33 AM 02/11/2018   12:38 PM 08/29/2017   12:17 PM 12/26/2016    8:21 AM  Advanced Directives  Does Patient Have a Medical Advance Directive? No No Yes No No No No  Type of Surveyor, minerals;Living will      Copy of Healthcare Power of Attorney in Chart?   No - copy requested      Would patient like information on creating a medical advance directive? No - Patient declined No - Patient declined  Yes (MAU/Ambulatory/Procedural Areas - Information given)  No - Patient declined Yes (ED - Information included in AVS)    Current Medications (verified) Outpatient Encounter Medications as of 07/21/2022  Medication Sig   amLODipine (NORVASC) 10 MG tablet Take 10 mg by mouth daily.   aspirin 81 MG tablet Take 1 tablet by mouth daily.   carvedilol (COREG) 3.125 MG tablet Take 1 tablet by mouth 2 (two) times daily. cardio   colchicine 0.6 MG tablet TAKE 1 TABLET(0.6 MG) BY MOUTH DAILY   diltiazem (DILACOR XR) 240 MG 24 hr capsule Take 1 capsule (240 mg total) by mouth daily.   glucose blood (ACCU-CHEK AVIVA PLUS) test strip USE TWICE DAILY AS  DIRECTED   lisinopril-hydrochlorothiazide (ZESTORETIC) 20-25 MG tablet Take 1 tablet by mouth daily. cardio   metFORMIN (GLUCOPHAGE) 500 MG tablet TAKE 1 TABLET TWICE DAILY (Patient taking differently: 500 mg. TAKE 1 TABLETin morning and 2 at night/ Dr Gershon Crane)   simvastatin (ZOCOR) 20 MG tablet Take 1 tablet (20 mg total) by mouth daily.   tiZANidine (ZANAFLEX) 4 MG tablet Take 1 tablet (4 mg total) by mouth at bedtime.   amoxicillin (AMOXIL) 500 MG capsule Take 1 capsule (500 mg total) by mouth 3 (three) times daily for 10 days.   Facility-Administered Encounter Medications as of 07/21/2022  Medication   albuterol (PROVENTIL) (2.5 MG/3ML) 0.083% nebulizer solution 2.5 mg    Allergies (verified) Patient has no known allergies.   History: Past Medical History:  Diagnosis Date   Diabetes mellitus without complication (HCC)    Gout    Hyperlipidemia    Hypertension    Past Surgical History:  Procedure Laterality Date   APPENDECTOMY     gallstones  06/12/2009   Family History  Problem Relation Age of Onset   Breast cancer Neg Hx    Social History   Socioeconomic History   Marital status: Widowed    Spouse name: Not on file   Number of children: Not on file   Years of education: Not on file   Highest education level:  Not on file  Occupational History   Not on file  Tobacco Use   Smoking status: Former   Smokeless tobacco: Never  Vaping Use   Vaping Use: Never used  Substance and Sexual Activity   Alcohol use: No    Alcohol/week: 0.0 standard drinks of alcohol   Drug use: No   Sexual activity: Not Currently  Other Topics Concern   Not on file  Social History Narrative   Pt lives with her son and his family   Social Determinants of Health   Financial Resource Strain: Low Risk  (07/21/2022)   Overall Financial Resource Strain (CARDIA)    Difficulty of Paying Living Expenses: Not hard at all  Food Insecurity: No Food Insecurity (07/21/2022)   Hunger Vital Sign     Worried About Running Out of Food in the Last Year: Never true    Ran Out of Food in the Last Year: Never true  Transportation Needs: No Transportation Needs (07/21/2022)   PRAPARE - Administrator, Civil Service (Medical): No    Lack of Transportation (Non-Medical): No  Physical Activity: Insufficiently Active (07/21/2022)   Exercise Vital Sign    Days of Exercise per Week: 3 days    Minutes of Exercise per Session: 30 min  Stress: No Stress Concern Present (07/21/2022)   [employer] of Occupational Health - Occupational Stress Questionnaire    Feeling of Stress : Not at all  Social Connections: Moderately Isolated (07/21/2022)   Social Connection and Isolation Panel [NHANES]    Frequency of Communication with Friends and Family: More than three times a week    Frequency of Social Gatherings with Friends and Family: More than three times a week    Attends Religious Services: More than 4 times per year    Active Member of [employer] or Organizations: No    Attends Banker Meetings: Never    Marital Status: Widowed    Tobacco Counseling Counseling given: Not Answered   Clinical Intake:  Pre-visit preparation completed: Yes  Pain : No/denies pain     Nutritional Risks: None Diabetes: Yes CBG done?: No Did pt. bring in CBG monitor from home?: No  How often do you need to have someone help you when you read instructions, pamphlets, or other written materials from your doctor or pharmacy?: 1 - Never  Diabetic?yes Nutrition Risk Assessment:  Has the patient had any N/V/D within the last 2 months?  No  Does the patient have any non-healing wounds?  No  Has the patient had any unintentional weight loss or weight gain?  No   Diabetes:  Is the patient diabetic?  Yes  If diabetic, was a CBG obtained today?  No  Did the patient bring in their glucometer from home?  No  How often do you monitor your CBG's? Twice per day.   Financial Strains and Diabetes  Management:  Are you having any financial strains with the device, your supplies or your medication? No .  Does the patient want to be seen by Chronic Care Management for management of their diabetes?  No  Would the patient like to be referred to a Nutritionist or for Diabetic Management?  No   Diabetic Exams:  Diabetic Eye Exam: Completed 08/06/21. Marland Kitchen Pt has been advised about the importance in completing this exam.  Diabetic Foot Exam: Completed 07/24/21. Pt has been advised about the importance in completing this exam.     Interpreter Needed?: No  Information entered  by :: Kennedy Bucker, LPN   Activities of Daily Living    07/21/2022    9:04 AM  In your present state of health, do you have any difficulty performing the following activities:  Hearing? 0  Vision? 0  Difficulty concentrating or making decisions? 0  Walking or climbing stairs? 0  Dressing or bathing? 0  Doing errands, shopping? 0  Preparing Food and eating ? N  Using the Toilet? N  In the past six months, have you accidently leaked urine? N  Do you have problems with loss of bowel control? N  Managing your Medications? N  Managing your Finances? N  Housekeeping or managing your Housekeeping? N    Patient Care Team: [name], MD as PCP - General (Family Medicine) Sherlon Handing, MD as Consulting Physician (Internal Medicine)  Indicate any recent Medical Services you may have received from other than [facility] providers in the past year (date may be approximate).     Assessment:   This is a routine wellness examination for April [name].  Hearing/Vision screen Hearing Screening - Comments:: No aids Vision Screening - Comments:: No glasses- Dr.Shade  Dietary issues and exercise activities discussed: Current Exercise Habits: Home exercise routine, Type of exercise: walking, Time (Minutes): 30, Frequency (Times/Week): 3, Weekly Exercise (Minutes/Week): 90, Intensity: Mild   Goals Addressed              This Visit's Progress    DIET - EAT MORE FRUITS AND VEGETABLES         Depression Screen    07/21/2022    9:02 AM 07/12/2022   10:38 AM 06/01/2022    9:45 AM 04/08/2022   10:24 AM 11/30/2021    8:53 AM 11/03/2021    8:41 AM 10/14/2021   11:36 AM  PHQ 2/9 Scores  PHQ - 2 Score 0 0 0 0 0 0 0  PHQ- 9 Score 0 0 0 0 0 0 0    Fall Risk    07/21/2022    9:04 AM 07/12/2022   10:38 AM 06/01/2022    9:45 AM 04/08/2022   10:24 AM 11/30/2021    8:53 AM  Fall Risk   Falls in the past year? 0 0 0 0 0  Number falls in past yr: 0 0 0 0 0  Injury with Fall? 0 0 0 0 0  Risk for fall due to : No Fall Risks No Fall Risks No Fall Risks No Fall Risks No Fall Risks  Follow up Falls prevention discussed;Falls evaluation completed Falls evaluation completed Falls evaluation completed Falls evaluation completed Falls evaluation completed    FALL RISK PREVENTION PERTAINING TO THE HOME:  Any stairs in or around the home? No  If so, are there any without handrails? No  Home free of loose throw rugs in walkways, pet beds, electrical cords, etc? Yes  Adequate lighting in your home to reduce risk of falls? Yes   ASSISTIVE DEVICES UTILIZED TO PREVENT FALLS:  Life alert? No  Use of a cane, walker or w/c? No  Grab bars in the bathroom? No  Shower chair or bench in shower? No  Elevated toilet seat or a handicapped toilet? No    Cognitive Function:        07/21/2022    9:07 AM 07/04/2019    9:35 AM  6CIT Screen  What Year? 0 points 0 points  What month? 0 points 0 points  What time? 0 points 0 points  Count back from 20  0 points 0 points  Months in reverse 0 points 0 points  Repeat phrase 0 points 0 points  Total Score 0 points 0 points    Immunizations Immunization History  Administered Date(s) Administered   Fluad Quad(high Dose 65+) 10/26/2018, 11/27/2019, 11/05/2020   Influenza, High Dose Seasonal PF 10/06/2016   Influenza,inj,Quad PF,6+ Mos 11/02/2012, 10/07/2021   Influenza-Unspecified  12/09/2017   PFIZER(Purple Top)SARS-COV-2 Vaccination 04/15/2019, 05/07/2019, 10/24/2019   PNEUMOCOCCAL CONJUGATE-20 11/05/2020   Pneumococcal Conjugate-13 10/06/2016   Pneumococcal Polysaccharide-23 06/08/2019   Zoster Recombinat (Shingrix) 07/16/2020, 09/15/2020    TDAP status: Due, Education has been provided regarding the importance of this vaccine. Advised may receive this vaccine at local pharmacy or Health Dept. Aware to provide a copy of the vaccination record if obtained from local pharmacy or Health Dept. Verbalized acceptance and understanding.  Flu Vaccine status: Up to date  Pneumococcal vaccine status: Up to date  Covid-19 vaccine status: Completed vaccines  Qualifies for Shingles Vaccine? Yes   Zostavax completed No   Shingrix Completed?: Yes  Screening Tests Health Maintenance  Topic Date Due   Diabetic kidney evaluation - Urine ACR  07/25/2022   FOOT EXAM  07/25/2022   OPHTHALMOLOGY EXAM  08/07/2022   INFLUENZA VACCINE  09/09/2022   HEMOGLOBIN A1C  10/14/2022   MAMMOGRAM  01/19/2023   Diabetic kidney evaluation - eGFR measurement  06/01/2023   Medicare Annual Wellness (AWV)  07/21/2023   Pneumonia Vaccine 18+ Years old  Completed   DEXA SCAN  Completed   Zoster Vaccines- Shingrix  Completed   HPV VACCINES  Aged Out   DTaP/Tdap/Td  Discontinued   COVID-19 Vaccine  Discontinued    Health Maintenance  Health Maintenance Due  Topic Date Due   Diabetic kidney evaluation - Urine ACR  07/25/2022    Colorectal cancer screening: No longer required.   Mammogram status: No longer required due to age.  Bone Density status: Completed 09/27/17. Results reflect: Bone density results: OSTEOPENIA. Repeat every 5 years.  Lung Cancer Screening: (Low Dose CT Chest recommended if Age 64-80 years, 30 pack-year currently smoking OR have quit w/in 15years.) does not qualify.   Additional Screening:  Hepatitis C Screening: does not qualify; Completed no  Vision  Screening: Recommended annual ophthalmology exams for early detection of glaucoma and other disorders of the eye. Is the patient up to date with their annual eye exam?  Yes  Who is the provider or what is the name of the office in which the patient attends annual eye exams? Dr.Shade If pt is not established with a provider, would they like to be referred to a provider to establish care? No .   Dental Screening: Recommended annual dental exams for proper oral hygiene  Community Resource Referral / Chronic Care Management: CRR required this visit?  No   CCM required this visit?  No      Plan:     I have personally reviewed and noted the following in the patient's chart:   Medical and social history Use of alcohol, tobacco or illicit drugs  Current medications and supplements including opioid prescriptions. Patient is not currently taking opioid prescriptions. Functional ability and status Nutritional status Physical activity Advanced directives List of other physicians Hospitalizations, surgeries, and ER visits in previous 12 months Vitals Screenings to include cognitive, depression, and falls Referrals and appointments  In addition, I have reviewed and discussed with patient certain preventive protocols, quality metrics, and best practice recommendations. A written personalized care plan for  preventive services as well as general preventive health recommendations were provided to patient.     Hal Hope, LPN   1/61/0960   Nurse Notes: none

## 2022-07-22 ENCOUNTER — Other Ambulatory Visit: Payer: Self-pay

## 2022-07-22 ENCOUNTER — Emergency Department
Admission: EM | Admit: 2022-07-22 | Discharge: 2022-07-22 | Disposition: A | Discharge status: Home / Self Care | Payer: Medicare HMO | Attending: Emergency Medicine | Admitting: Emergency Medicine

## 2022-07-22 ENCOUNTER — Emergency Department: Payer: Medicare HMO

## 2022-07-22 ENCOUNTER — Encounter: Payer: Self-pay | Admitting: Emergency Medicine

## 2022-07-22 DIAGNOSIS — I10 Essential (primary) hypertension: Secondary | ICD-10-CM | POA: Insufficient documentation

## 2022-07-22 DIAGNOSIS — E119 Type 2 diabetes mellitus without complications: Secondary | ICD-10-CM | POA: Insufficient documentation

## 2022-07-22 DIAGNOSIS — M1712 Unilateral primary osteoarthritis, left knee: Secondary | ICD-10-CM | POA: Insufficient documentation

## 2022-07-22 DIAGNOSIS — M25562 Pain in left knee: Secondary | ICD-10-CM

## 2022-07-22 MED ORDER — TRAMADOL HCL 50 MG PO TABS: 50.0000 mg | ORAL_TABLET | Freq: Two times a day (BID) | ORAL | 0 refills | Status: AC

## 2022-07-22 MED ORDER — TRAMADOL HCL 50 MG PO TABS
50.0000 mg | ORAL_TABLET | Freq: Once | ORAL | Status: AC
  Administered 2022-07-22: 50 mg via ORAL
  Filled 2022-07-22: qty 1

## 2022-07-22 NOTE — ED Triage Notes (Signed)
Pt complains of left knee pain x 2 days. Denies any injury denies falls. Pt has hx of gout in left foot and thought it was gout but medication is not helping. Difficulty walking due to pain.

## 2022-07-22 NOTE — ED Provider Notes (Signed)
[facility] Emergency Department Provider Note     Event Date/Time   First MD Initiated Contact with Patient 07/22/22 1245     (approximate)   History   Knee Pain   HPI  April [name] is a 82 y.o. female with a history of diabetes, HTN, OA, HLD and lumbar degenerative disc disease, presents to the ED for evaluation of left knee pain for the last several days.  Patient denies any preceding injury, trauma, or fall.  She gives a history of gout which typically affects her great toe on the left foot, so she began taking her colchicine assuming this was a gout related flare to the knee.  She denies any significant benefit with the colchicine.  Patient describes pain aggravated by walking.  Physical Exam   Triage Vital Signs: ED Triage Vitals  Enc Vitals Group     BP 07/22/22 1221 (!) 139/54     Pulse Rate 07/22/22 1221 61     Resp 07/22/22 1221 16     Temp 07/22/22 1221 98.2 F (36.8 C)     Temp Source 07/22/22 1221 Oral     SpO2 07/22/22 1221 100 %     Weight 07/22/22 1223 140 lb (63.5 kg)     Height 07/22/22 1223 5\' 6"  (1.676 m)     Head Circumference --      Peak Flow --      Pain Score 07/22/22 1223 10     Pain Loc --      Pain Edu? --      Excl. in GC? --     Most recent vital signs: Vitals:   07/22/22 1221  BP: (!) 139/54  Pulse: 61  Resp: 16  Temp: 98.2 F (36.8 C)  SpO2: 100%    General Awake, no distress. NAD CV:  Good peripheral perfusion.  RESP:  Normal effort.  ABD:  No distention.  MSK:  Knee without obvious deformity, dislocation, or joint effusion.  Patient with active range of motion limited by pain.  Normal patella tracking on exam.  No popliteal space fullness is noted.  No calf or Achilles tendon noted distally.   ED Results / Procedures / Treatments   Labs (all labs ordered are listed, but only abnormal results are displayed) Labs Reviewed - No data to display   EKG   RADIOLOGY  I personally viewed and  evaluated these images as part of my medical decision making, as well as reviewing the written report by the radiologist.  ED Provider Interpretation: Significant OA changes most very to the medial compartment with osteophytes noted  DG Knee Complete 4 Views Left  Result Date: 07/22/2022 CLINICAL DATA:  Left knee pain for 2 days without trauma EXAM: LEFT KNEE - COMPLETE 4+ VIEW COMPARISON:  03/21/2017 FINDINGS: Vascular calcifications. No acute fracture or dislocation. 3 compartment osteoarthritis, most significant in the patellofemoral and medial compartments. Mildly progressive in the medial compartment. No joint effusion. Enthesophytes at the quadriceps insertion and patellar tendon origin. IMPRESSION: 3 compartment osteoarthritis, progressive in the medial compartment. No acute superimposed process. Electronically Signed   By: Jeronimo Greaves M.D.   On: 07/22/2022 14:04     PROCEDURES:  Critical Care performed: No  Procedures   MEDICATIONS ORDERED IN ED: Medications  traMADol (ULTRAM) tablet 50 mg (50 mg Oral Given 07/22/22 1429)     IMPRESSION / MDM / ASSESSMENT AND PLAN / ED COURSE  I reviewed the triage vital signs and  the nursing notes.                              Differential diagnosis includes, but is not limited to, OA, DJD, gout, tendinitis, bursitis  Patient's presentation is most consistent with acute complicated illness / injury requiring diagnostic workup.  Patient's diagnosis is consistent with acute left knee pain secondary to underlying DJD and osteoarthritis. Patient will be discharged home with prescriptions for Ultram. Patient is to follow up with her primary provider as needed or otherwise directed. Patient is given ED precautions to return to the ED for any worsening or new symptoms.  FINAL CLINICAL IMPRESSION(S) / ED DIAGNOSES   Final diagnoses:  Acute pain of left knee  Primary osteoarthritis of left knee     Rx / DC Orders   ED Discharge Orders           Ordered    traMADol (ULTRAM) 50 MG tablet  2 times daily        07/22/22 1421             Note:  This document was prepared using Dragon voice recognition software and may include unintentional dictation errors.    Lissa Hoard, PA-C 07/22/22 1759    Jene Every, MD 07/22/22 [phone]

## 2022-07-22 NOTE — Discharge Instructions (Signed)
Your exam and x-ray reveals significant osteoarthritis of the left knee.  This is the source of your current pain.  Take the prescription pain medicine as discussed.  You may take OTC Tylenol or Motrin for nondrowsy pain relief.  Follow-up with your primary provider for ongoing management.

## 2022-07-23 ENCOUNTER — Telehealth: Payer: Self-pay

## 2022-07-23 NOTE — Transitions of Care (Post Inpatient/ED Visit) (Signed)
07/23/2022  Name: April [name] MRN: [id number] DOB: Jan 04, 1941  Today's TOC FU Call Status: Today's TOC FU Call Status:: Successful TOC FU Call Competed TOC FU Call Complete Date: 07/23/22  Transition Care Management Follow-up Telephone Call Date of Discharge: 07/22/22 Discharge Facility: [facility]) Type of Discharge: Emergency Department Reason for ED Visit: Orthopedic Conditions How have you been since you were released from the hospital?: Better Any questions or concerns?: No  Items Reviewed: Did you receive and understand the discharge instructions provided?: Yes Medications obtained,verified, and reconciled?: Yes (Medications Reviewed) Any new allergies since your discharge?: No Dietary orders reviewed?: NA Do you have support at home?: Yes People in Home: child(ren), adult Name of Support/Comfort Primary Source: Raynelle Fanning  and Alinda Money  Medications Reviewed Today: Medications Reviewed Today     Reviewed by Everitt Amber (Medical Assistant) on 07/23/22 at [phone]  Med List Status: <None>   Medication Order Taking? Sig Documenting Provider Last Dose Status Informant  albuterol (PROVENTIL) (2.5 MG/3ML) 0.083% nebulizer solution 2.5 mg [id number]   [name], MD  Active   amLODipine (NORVASC) 10 MG tablet [id number] Yes Take 10 mg by mouth daily. [provider] Taking Active   aspirin 81 MG tablet [id number] Yes Take 1 tablet by mouth daily. [provider] Taking Active            Med Note Reather Littler D   Wed Jul 04, 2019  9:29 AM)    carvedilol (COREG) 3.125 MG tablet [id number] Yes Take 1 tablet by mouth 2 (two) times daily. cardio [provider] Taking Active   colchicine 0.6 MG tablet [id number] Yes TAKE 1 TABLET(0.6 MG) BY MOUTH DAILY [name], MD Taking Active            Med Note Danae Chen Jul 21, 2022  9:01 AM) Leanora Ivanoff PRN   diltiazem (DILACOR XR) 240 MG 24 hr capsule [id number] Yes Take 1 capsule  (240 mg total) by mouth daily. [name], MD Taking Active   glucose blood (ACCU-CHEK AVIVA PLUS) test strip [id number] Yes USE TWICE DAILY AS DIRECTED [provider] Taking Active   lisinopril-hydrochlorothiazide (ZESTORETIC) 20-25 MG tablet [id number] Yes Take 1 tablet by mouth daily. cardio [provider] Taking Active   metFORMIN (GLUCOPHAGE) 500 MG tablet [id number] Yes TAKE 1 TABLET TWICE DAILY  Patient taking differently: 500 mg. TAKE 1 TABLETin morning and 2 at night/ Dr Suzanna Obey, Vanita Panda, MD Taking Active   simvastatin (ZOCOR) 20 MG tablet [id number] Yes Take 1 tablet (20 mg total) by mouth daily. [name], MD Taking Active   tiZANidine (ZANAFLEX) 4 MG tablet [id number] Yes Take 1 tablet (4 mg total) by mouth at bedtime. [name], MD Taking Active   traMADol Janean Sark) 50 MG tablet [id number] Yes Take 1 tablet (50 mg total) by mouth 2 (two) times daily for 7 days. Menshew, Charlesetta Ivory, PA-C Taking Active             Home Care and Equipment/Supplies: Were Home Health Services Ordered?: NA Any new equipment or medical supplies ordered?: No  Functional Questionnaire: Do you need assistance with bathing/showering or dressing?: No Do you need assistance with meal preparation?: No Do you need assistance with eating?: No Do you have difficulty maintaining continence: No Do you need assistance with getting out of bed/getting out of a chair/moving?: No Do you have difficulty managing or taking your  medications?: No  Follow up appointments reviewed: PCP Follow-up appointment confirmed?: No (seeing ortho) Specialist Hospital Follow-up appointment confirmed?: Yes Date of Specialist follow-up appointment?: 07/29/22 Follow-Up Specialty Provider:: Ashley Royalty Do you need transportation to your follow-up appointment?: No Do you understand care options if your condition(s) worsen?: Yes-patient verbalized understanding    SIGNATURE Arthur Holms

## 2022-07-29 ENCOUNTER — Encounter: Payer: Medicare HMO | Admitting: Family Medicine

## 2022-07-30 ENCOUNTER — Ambulatory Visit (INDEPENDENT_AMBULATORY_CARE_PROVIDER_SITE_OTHER): Payer: Medicare HMO | Admitting: Family Medicine

## 2022-07-30 ENCOUNTER — Encounter: Payer: Self-pay | Admitting: Family Medicine

## 2022-07-30 VITALS — BP 130/58 | HR 76 | Ht 66.0 in | Wt 140.0 lb

## 2022-07-30 DIAGNOSIS — M1712 Unilateral primary osteoarthritis, left knee: Secondary | ICD-10-CM | POA: Diagnosis not present

## 2022-07-30 NOTE — Progress Notes (Signed)
     Primary Care / Sports Medicine Office Visit  Patient Information:  Patient ID: LENI PANKONIN, female DOB: 1940-12-14 Age: 82 y.o. MRN: [id number]   CLAUDINA OLIPHANT is a pleasant 82 y.o. female presenting with the following:  Chief Complaint  Patient presents with   Knee Pain    Left knee, no pain right now,     Vitals:   07/30/22 1101  BP: (!) 130/58  Pulse: 76  SpO2: 96%   Vitals:   07/30/22 1101  Weight: 140 lb (63.5 kg)  Height: 5\' 6"  (1.676 m)   Body mass index is 22.6 kg/m.  DG Knee Complete 4 Views Left  Result Date: 07/22/2022 CLINICAL DATA:  Left knee pain for 2 days without trauma EXAM: LEFT KNEE - COMPLETE 4+ VIEW COMPARISON:  03/21/2017 FINDINGS: Vascular calcifications. No acute fracture or dislocation. 3 compartment osteoarthritis, most significant in the patellofemoral and medial compartments. Mildly progressive in the medial compartment. No joint effusion. Enthesophytes at the quadriceps insertion and patellar tendon origin. IMPRESSION: 3 compartment osteoarthritis, progressive in the medial compartment. No acute superimposed process. Electronically Signed   By: Jeronimo Greaves M.D.   On: 07/22/2022 14:04     Independent interpretation of notes and tests performed by another provider:   Independent interpretation of left knee x-rays dated 07/22/2022 demonstrate tricompartmental osteoarthritis with joint space narrowing maximally at the medial tibiofemoral and patellofemoral articulations, prominent osteophyte enthesophyte formation at the patella, no acute osseous processes noted.  Procedures performed:   None  Pertinent History, Exam, Impression, and Recommendations:   Alana was seen today for knee pain.  Primary osteoarthritis of left knee Assessment & Plan: Acute on chronic with most recent exacerbation over the past 1-2 weeks, necessitating visit to ER on 07/22/2022 where she was prescribed tramadol, and follow-up here.  Since that time she has noted  steady improvement, still symptomatic, primarily with ADLs, no red flag symptoms.  Examination with minimal tenderness at the medial joint line, maximally at the patellar facets and attempted maximal flexion limited to 110 degrees by pain, positive crepitus, no ligamentous laxity, exam otherwise nonfocal.  Findings are most consistent with left knee osteoarthritis related arthralgia, plan as follows: - Did offer corticosteroid injection however given her reassuring clinical picture and progress, will defer this intervention at this time - Patient to utilize Voltaren gel as needed and activity modifications - Can contact us if symptoms fail to improve over the next few weeks at which point we can revisit corticosteroid injection      Orders & Medications No orders of the defined types were placed in this encounter.  No orders of the defined types were placed in this encounter.    No follow-ups on file.     Jerrol Banana, MD, [facility]   Primary Care Sports Medicine Primary Care and Sports Medicine at [facility]

## 2022-07-30 NOTE — Assessment & Plan Note (Signed)
Acute on chronic with most recent exacerbation over the past 1-2 weeks, necessitating visit to ER on 07/22/2022 where she was prescribed tramadol, and follow-up here.  Since that time she has noted steady improvement, still symptomatic, primarily with ADLs, no red flag symptoms.  Examination with minimal tenderness at the medial joint line, maximally at the patellar facets and attempted maximal flexion limited to 110 degrees by pain, positive crepitus, no ligamentous laxity, exam otherwise nonfocal.  Findings are most consistent with left knee osteoarthritis related arthralgia, plan as follows: - Did offer corticosteroid injection however given her reassuring clinical picture and progress, will defer this intervention at this time - Patient to utilize Voltaren gel as needed and activity modifications - Can contact us if symptoms fail to improve over the next few weeks at which point we can revisit corticosteroid injection

## 2022-08-02 DIAGNOSIS — Z79899 Other long term (current) drug therapy: Secondary | ICD-10-CM | POA: Diagnosis not present

## 2022-08-02 DIAGNOSIS — E1159 Type 2 diabetes mellitus with other circulatory complications: Secondary | ICD-10-CM | POA: Diagnosis not present

## 2022-08-02 DIAGNOSIS — I152 Hypertension secondary to endocrine disorders: Secondary | ICD-10-CM | POA: Diagnosis not present

## 2022-08-02 DIAGNOSIS — E785 Hyperlipidemia, unspecified: Secondary | ICD-10-CM | POA: Diagnosis not present

## 2022-08-02 DIAGNOSIS — H524 Presbyopia: Secondary | ICD-10-CM | POA: Diagnosis not present

## 2022-08-02 DIAGNOSIS — E1169 Type 2 diabetes mellitus with other specified complication: Secondary | ICD-10-CM | POA: Diagnosis not present

## 2022-08-02 DIAGNOSIS — E1129 Type 2 diabetes mellitus with other diabetic kidney complication: Secondary | ICD-10-CM | POA: Diagnosis not present

## 2022-08-02 DIAGNOSIS — E119 Type 2 diabetes mellitus without complications: Secondary | ICD-10-CM | POA: Diagnosis not present

## 2022-08-02 DIAGNOSIS — R809 Proteinuria, unspecified: Secondary | ICD-10-CM | POA: Diagnosis not present

## 2022-08-03 LAB — HM DIABETES EYE EXAM

## 2022-08-23 ENCOUNTER — Ambulatory Visit: Payer: Self-pay

## 2022-08-23 NOTE — Telephone Encounter (Signed)
  Chief Complaint: Sore throat Symptoms: hurts to swallow Frequency: Last month Pertinent Negatives: Patient denies fever Disposition: [] ED /[] Urgent Care (no appt availability in office) / [] Appointment(In office/virtual)/ []  [place] [facility]/ [] Home Care/ [x] Refused Recommended Disposition /[] [place] [facility]/ []  Follow-up with PCP Additional Notes: Pt states that she had a sore throat last month and was given amoxicillin. Pt did not take it as directed. Some days she only took 2 pills. Pt did not like Amoxi as she developed a yeast infection from taking it. Pt reports that throat was getting better, but did not resolve and now hurts a lot when she swallows.  Pt states that she does not want to come into the office and does not want abx, because she does not want another yeast infection. She wants something to get rid of sore throat, however.  Please advise.    Summary: Sore throat, wants abx, does not want appt   Pt called in reporting that she has a sore throat, does not want amoxicillin. Does not want to schedule an appt   Best contact:  [phone]       Reason for Disposition  [1] Sore throat is the only symptom AND [2] present > 48 hours  Answer Assessment - Initial Assessment Questions 1. ONSET: "When did the throat start hurting?" (Hours or days ago)      Last month 2. SEVERITY: "How bad is the sore throat?" (Scale 1-10; mild, moderate or severe)   - MILD (1-3):  Doesn't interfere with eating or normal activities.   - MODERATE (4-7): Interferes with eating some solids and normal activities.   - SEVERE (8-10):  Excruciating pain, interferes with most normal activities.   - SEVERE WITH DYSPHAGIA (10): Can't swallow liquids, drooling.     No pain - hurts when  5. FEVER: "Do you have a fever?" If Yes, ask: "What is your temperature, how was it measured, and when did it start?"     no 6. PUS ON THE TONSILS: "Is there pus on the tonsils in the back of your  throat?"     No tonsils 7. OTHER SYMPTOMS: "Do you have any other symptoms?" (e.g., difficulty breathing, headache, rash)     Hurts to swallow  Protocols used: Sore Throat-A-AH

## 2022-08-24 ENCOUNTER — Telehealth: Payer: Self-pay

## 2022-08-24 NOTE — Telephone Encounter (Signed)
Called pt this morning concerning message about sore throat. She stated her throat was better today after getting spray from pharm. I explained to her that ALL antibiotics can give you a yeast infection and if this happened again, she should let us know so we can send in a diflucan. She is going to let me know tomorrow if she needs to be seen. She did not take all of the Amoxil from her sick visit.

## 2022-08-25 ENCOUNTER — Ambulatory Visit: Payer: Self-pay | Admitting: *Deleted

## 2022-08-25 NOTE — Telephone Encounter (Signed)
Summary: sore throat / rx req   The patient has had a sore throat for multiple weeks  The patient shares that they have been previously seen for this concern with no improvement  Please contact further when possible       Reason for Disposition  [1] Sore throat is the only symptom AND [2] present > 48 hours  Answer Assessment - Initial Assessment Questions 1. ONSET: "When did the throat start hurting?" (Hours or days ago)      2 weeks ago- patient took antibiotics- patient is not sure she took her medication correctly. Patient did have improvement.  2. SEVERITY: "How bad is the sore throat?" (Scale 1-10; mild, moderate or severe)   - MILD (1-3):  Doesn't interfere with eating or normal activities.   - MODERATE (4-7): Interferes with eating some solids and normal activities.   - SEVERE (8-10):  Excruciating pain, interferes with most normal activities.   - SEVERE WITH DYSPHAGIA (10): Can't swallow liquids, drooling.     R side of throat is irritated  3. STREP EXPOSURE: "Has there been any exposure to strep within the past week?" If Yes, ask: "What type of contact occurred?"      no 4.  VIRAL SYMPTOMS: "Are there any symptoms of a cold, such as a runny nose, cough, hoarse voice or red eyes?"      no 5. FEVER: "Do you have a fever?" If Yes, ask: "What is your temperature, how was it measured, and when did it start?"     no 6. PUS ON THE TONSILS: "Is there pus on the tonsils in the back of your throat?"     No tonsils 7. OTHER SYMPTOMS: "Do you have any other symptoms?" (e.g., difficulty breathing, headache, rash)     no  Protocols used: Sore Throat-A-AH

## 2022-08-25 NOTE — Telephone Encounter (Signed)
  Chief Complaint: sore throat Symptoms: chronic sore throat- was treated with antibiotics- stil has sore throat.  Frequency: 2 weeks Pertinent Negatives: Patient denies any other symptoms Disposition: [] ED /[] Urgent Care (no appt availability in office) / [x] Appointment(In office/virtual)/ []  [place] [facility]/ [] Home Care/ [] Refused Recommended Disposition /[] [place] [facility]/ []  Follow-up with PCP Additional Notes: Patient states she has chronic sore throat- she believes she has sinus drainage that is irritating her throat and causing pain.

## 2022-08-27 ENCOUNTER — Encounter: Payer: Self-pay | Admitting: Family Medicine

## 2022-08-27 ENCOUNTER — Ambulatory Visit (INDEPENDENT_AMBULATORY_CARE_PROVIDER_SITE_OTHER): Payer: Medicare HMO | Admitting: Family Medicine

## 2022-08-27 VITALS — BP 128/70 | HR 67 | Temp 99.3°F | Ht 66.0 in | Wt 148.0 lb

## 2022-08-27 DIAGNOSIS — J0100 Acute maxillary sinusitis, unspecified: Secondary | ICD-10-CM

## 2022-08-27 DIAGNOSIS — R509 Fever, unspecified: Secondary | ICD-10-CM | POA: Diagnosis not present

## 2022-08-27 LAB — POCT RAPID STREP A (OFFICE): Rapid Strep A Screen: NEGATIVE

## 2022-08-27 LAB — POC COVID19 BINAXNOW: SARS Coronavirus 2 Ag: NEGATIVE

## 2022-08-27 MED ORDER — AZITHROMYCIN 250 MG PO TABS
ORAL_TABLET | ORAL | 0 refills | Status: AC
Start: 2022-08-27 — End: 2022-09-01

## 2022-08-27 NOTE — Progress Notes (Signed)
Date:  08/27/2022   Name:  April [name]   DOB:  13-Aug-1940   MRN:  [id number]   Chief Complaint: Sore Throat (Hurting on R) side up to ear, low grade fever)  HPI  Lab Results  Component Value Date   NA 141 06/01/2022   K 4.5 06/01/2022   CO2 22 06/01/2022   GLUCOSE 200 (H) 06/01/2022   BUN 15 06/01/2022   CREATININE 1.14 (H) 06/01/2022   CALCIUM 9.7 06/01/2022   EGFR 48 (L) 06/01/2022   GFRNONAA 51 (L) 09/24/2019   Lab Results  Component Value Date   CHOL 149 06/01/2022   HDL 59 06/01/2022   LDLCALC 74 06/01/2022   TRIG 85 06/01/2022   CHOLHDL 3.1 09/24/2019   Lab Results  Component Value Date   TSH 0.98 07/24/2021   Lab Results  Component Value Date   HGBA1C 6.6 04/13/2022   Lab Results  Component Value Date   WBC 7.4 08/29/2017   HGB 10.7 (L) 08/29/2017   HCT 32.6 (L) 08/29/2017   MCV 88.6 08/29/2017   PLT 284 08/29/2017   Lab Results  Component Value Date   ALT 7 06/01/2022   AST 15 06/01/2022   ALKPHOS 66 06/01/2022   BILITOT 0.4 06/01/2022   No results found for: "25OHVITD2", "25OHVITD3", "VD25OH"   Review of Systems  Patient Active Problem List   Diagnosis Date Noted   Lumbar facet arthropathy 04/08/2022   Lumbar degenerative disc disease 04/08/2022   Elevated transaminase level 04/10/2019   Primary osteoarthritis of left knee 10/06/2016   Type 2 diabetes mellitus without complications (HCC) 06/20/2014   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  Current Meds  Medication Sig   amLODipine (NORVASC) 10 MG tablet Take 10 mg by mouth daily.   aspirin 81 MG tablet Take 1 tablet by mouth daily.   azithromycin (ZITHROMAX) 250 MG tablet Take 2 tablets on day 1, then 1 tablet daily on days 2 through 5   carvedilol (COREG) 3.125 MG tablet Take 1 tablet by mouth 2 (two) times daily. cardio   colchicine 0.6 MG tablet TAKE 1 TABLET(0.6 MG) BY MOUTH DAILY   diltiazem (DILACOR XR) 240 MG 24 hr capsule Take 1 capsule (240 mg total) by mouth daily.   glucose blood (ACCU-CHEK AVIVA PLUS) test strip USE TWICE DAILY AS DIRECTED   lisinopril-hydrochlorothiazide (ZESTORETIC) 20-25 MG tablet Take 1 tablet by mouth daily. cardio   metFORMIN (GLUCOPHAGE) 500 MG tablet TAKE 1 TABLET TWICE DAILY (Patient taking differently: 500 mg. TAKE 1 TABLETin morning and 2 at night/ Dr Gershon Crane)   simvastatin (ZOCOR) 20 MG tablet Take 1 tablet (20 mg total) by mouth daily.   tiZANidine (ZANAFLEX) 4 MG tablet Take 1 tablet (4 mg total) by mouth at bedtime.  Current Facility-Administered Medications for the 08/27/22 encounter (Office Visit) with [name], MD  Medication   albuterol (PROVENTIL) (2.5 MG/3ML) 0.083% nebulizer solution 2.5 mg       08/27/2022    3:03 PM 07/12/2022   10:38 AM 06/01/2022    9:45 AM 04/08/2022   10:24 AM  GAD 7 : Generalized Anxiety Score  Nervous, Anxious, on Edge 0 0 0 0  Control/stop worrying 0 0 0 0  Worry too much - different things 0 0 0 0  Trouble relaxing 0 0 0 0  Restless 0 0 0 0  Easily annoyed or irritable 0 0 0 0  Afraid - awful might happen 0 0 0 0  Total GAD 7 Score 0 0 0 0  Anxiety Difficulty Not difficult at all Not difficult at all Not difficult at all Not difficult at all       08/27/2022    3:03 PM 07/21/2022    9:02 AM 07/12/2022   10:38 AM   Depression screen PHQ 2/9  Decreased Interest 0 0 0  Down, Depressed, Hopeless 0 0 0  PHQ - 2 Score 0 0 0  Altered sleeping 0 0 0  Tired, decreased energy 0 0 0  Change in appetite 0 0 0  Feeling bad or failure about yourself  0 0 0  Trouble concentrating 0 0 0  Moving slowly or fidgety/restless 0 0 0  Suicidal thoughts 0 0 0  PHQ-9 Score 0 0 0  Difficult doing work/chores Not difficult at all Not difficult at all Not difficult at all    BP Readings from Last 3 Encounters:  08/27/22 128/70  07/30/22 (!) 130/58  07/22/22 (!) 139/54    Physical Exam  Wt Readings from Last 3 Encounters:  08/27/22 148 lb (67.1 kg)  07/30/22 140 lb (63.5 kg)  07/22/22 140 lb (63.5 kg)    BP 128/70   Pulse 67   Temp 99.3 F (37.4 C) (Oral)   Ht 5\' 6"  (1.676 m)   Wt 148 lb (67.1 kg)   BMI 23.89 kg/m   Assessment and Plan: 1. Acute non-recurrent maxillary sinusitis See written note - azithromycin (ZITHROMAX) 250 MG tablet; Take 2 tablets on day 1, then 1 tablet daily on days 2 through 5  Dispense: 6 tablet; Refill: 0 - POCT rapid strep A - POC COVID-19  2. Low grade fever  - POCT rapid strep A - POC COVID-19     [name], MD

## 2022-09-15 ENCOUNTER — Other Ambulatory Visit: Payer: Self-pay

## 2022-09-15 ENCOUNTER — Telehealth: Payer: Self-pay | Admitting: Family Medicine

## 2022-09-15 DIAGNOSIS — J029 Acute pharyngitis, unspecified: Secondary | ICD-10-CM

## 2022-09-15 NOTE — Telephone Encounter (Signed)
Copied from CRM [id number]. Topic: Referral - Request for Referral >> Sep 15, 2022  8:50 AM Everette C wrote: Has patient seen PCP for this complaint? Yes.   *If NO, is insurance requiring patient see PCP for this issue before PCP can refer them? Referral for which specialty: Otolaryngology  Preferred provider/office: Patient has no preference but would like them close to [place], [place]  Reason for referral: sore throat concerns

## 2022-09-15 NOTE — Progress Notes (Signed)
Ref placed ENT

## 2022-09-22 DIAGNOSIS — R0982 Postnasal drip: Secondary | ICD-10-CM | POA: Diagnosis not present

## 2022-09-22 DIAGNOSIS — H6983 Other specified disorders of Eustachian tube, bilateral: Secondary | ICD-10-CM | POA: Diagnosis not present

## 2022-09-22 DIAGNOSIS — J0100 Acute maxillary sinusitis, unspecified: Secondary | ICD-10-CM | POA: Diagnosis not present

## 2022-09-27 ENCOUNTER — Other Ambulatory Visit: Payer: Self-pay

## 2022-09-27 DIAGNOSIS — M109 Gout, unspecified: Secondary | ICD-10-CM

## 2022-09-27 MED ORDER — COLCHICINE 0.6 MG PO TABS
ORAL_TABLET | ORAL | 1 refills | Status: DC
Start: 2022-09-27 — End: 2023-05-17

## 2022-11-04 ENCOUNTER — Telehealth: Payer: Self-pay | Admitting: Family Medicine

## 2022-11-04 NOTE — Telephone Encounter (Signed)
Copied from CRM [id number]. Topic: General - Call Back - No Documentation >> Nov 04, 2022  9:25 AM Macon Large wrote: Reason for CRM: Pt stated she was returning a missed call that she received. Cb# [phone]

## 2022-11-04 NOTE — Telephone Encounter (Signed)
noted 

## 2022-11-16 DIAGNOSIS — I152 Hypertension secondary to endocrine disorders: Secondary | ICD-10-CM | POA: Diagnosis not present

## 2022-11-16 DIAGNOSIS — E119 Type 2 diabetes mellitus without complications: Secondary | ICD-10-CM | POA: Diagnosis not present

## 2022-11-16 DIAGNOSIS — E1169 Type 2 diabetes mellitus with other specified complication: Secondary | ICD-10-CM | POA: Diagnosis not present

## 2022-11-16 DIAGNOSIS — I10 Essential (primary) hypertension: Secondary | ICD-10-CM | POA: Diagnosis not present

## 2022-11-16 DIAGNOSIS — E1159 Type 2 diabetes mellitus with other circulatory complications: Secondary | ICD-10-CM | POA: Diagnosis not present

## 2022-11-16 DIAGNOSIS — I160 Hypertensive urgency: Secondary | ICD-10-CM | POA: Diagnosis not present

## 2022-11-16 DIAGNOSIS — E785 Hyperlipidemia, unspecified: Secondary | ICD-10-CM | POA: Diagnosis not present

## 2022-11-16 DIAGNOSIS — E782 Mixed hyperlipidemia: Secondary | ICD-10-CM | POA: Diagnosis not present

## 2022-11-16 DIAGNOSIS — R001 Bradycardia, unspecified: Secondary | ICD-10-CM | POA: Diagnosis not present

## 2022-12-02 ENCOUNTER — Ambulatory Visit (INDEPENDENT_AMBULATORY_CARE_PROVIDER_SITE_OTHER): Payer: Medicare HMO | Admitting: Family Medicine

## 2022-12-02 ENCOUNTER — Encounter: Payer: Self-pay | Admitting: Family Medicine

## 2022-12-02 VITALS — BP 132/78 | HR 75 | Ht 66.0 in | Wt 142.0 lb

## 2022-12-02 DIAGNOSIS — I10 Essential (primary) hypertension: Secondary | ICD-10-CM

## 2022-12-02 DIAGNOSIS — M47816 Spondylosis without myelopathy or radiculopathy, lumbar region: Secondary | ICD-10-CM

## 2022-12-02 DIAGNOSIS — E7849 Other hyperlipidemia: Secondary | ICD-10-CM | POA: Diagnosis not present

## 2022-12-02 DIAGNOSIS — M51369 Other intervertebral disc degeneration, lumbar region without mention of lumbar back pain or lower extremity pain: Secondary | ICD-10-CM | POA: Diagnosis not present

## 2022-12-02 DIAGNOSIS — Z23 Encounter for immunization: Secondary | ICD-10-CM | POA: Diagnosis not present

## 2022-12-02 MED ORDER — DILTIAZEM HCL ER 240 MG PO CP24: 240.0000 mg | ORAL_CAPSULE | Freq: Every day | ORAL | 1 refills | Status: DC

## 2022-12-02 MED ORDER — SIMVASTATIN 20 MG PO TABS: 20.0000 mg | ORAL_TABLET | Freq: Every day | ORAL | 1 refills | Status: DC

## 2022-12-02 MED ORDER — TIZANIDINE HCL 4 MG PO TABS
4.0000 mg | ORAL_TABLET | Freq: Every day | ORAL | 1 refills | Status: DC
Start: 2022-12-02 — End: 2023-08-16

## 2022-12-02 NOTE — Patient Instructions (Signed)

## 2022-12-02 NOTE — Progress Notes (Signed)
Date:  12/02/2022   Name:  April [name]   DOB:  June 06, 1940   MRN:  [id number]   Chief Complaint: Hypertension and Hyperlipidemia  back  Hypertension This is a chronic problem. The current episode started more than 1 year ago. The problem has been gradually improving since onset. The problem is controlled. Pertinent negatives include no anxiety, blurred vision, chest pain, headaches, malaise/fatigue, neck pain, orthopnea, palpitations, peripheral edema, PND, shortness of breath or sweats. There are no associated agents to hypertension. Past treatments include calcium channel blockers. The current treatment provides moderate improvement. There is no history of CAD/MI or CVA. There is no history of chronic renal disease, a hypertension causing med or renovascular disease.  Hyperlipidemia This is a chronic problem. The current episode started more than 1 year ago. The problem is controlled. Recent lipid tests were reviewed and are normal. She has no history of chronic renal disease. Pertinent negatives include no chest pain, myalgias or shortness of breath. Current antihyperlipidemic treatment includes statins. The current treatment provides moderate improvement of lipids. There are no compliance problems.   Back Pain This is a chronic problem. The current episode started more than 1 year ago. The problem has been gradually improving since onset. The pain is present in the lumbar spine. The pain is mild. Pertinent negatives include no abdominal pain, bladder incontinence, bowel incontinence, chest pain, dysuria, fever, headaches, paresthesias or weakness. She has tried muscle relaxant (tylenol) for the symptoms. The treatment provided moderate relief.    Lab Results  Component Value Date   NA 141 06/01/2022   K 4.5 06/01/2022   CO2 22 06/01/2022   GLUCOSE 200 (H) 06/01/2022   BUN 15 06/01/2022   CREATININE 1.14 (H) 06/01/2022   CALCIUM 9.7 06/01/2022   EGFR 48 (L) 06/01/2022   GFRNONAA 51  (L) 09/24/2019   Lab Results  Component Value Date   CHOL 149 06/01/2022   HDL 59 06/01/2022   LDLCALC 74 06/01/2022   TRIG 85 06/01/2022   CHOLHDL 3.1 09/24/2019   Lab Results  Component Value Date   TSH 0.98 07/24/2021   Lab Results  Component Value Date   HGBA1C 6.6 04/13/2022   Lab Results  Component Value Date   WBC 7.4 08/29/2017   HGB 10.7 (L) 08/29/2017   HCT 32.6 (L) 08/29/2017   MCV 88.6 08/29/2017   PLT 284 08/29/2017   Lab Results  Component Value Date   ALT 7 06/01/2022   AST 15 06/01/2022   ALKPHOS 66 06/01/2022   BILITOT 0.4 06/01/2022   No results found for: "25OHVITD2", "25OHVITD3", "VD25OH"   Review of Systems  Constitutional: Negative.  Negative for chills, fatigue, fever, malaise/fatigue and unexpected weight change.  HENT:  Negative for congestion, ear discharge, ear pain, rhinorrhea, sinus pressure, sneezing and sore throat.   Eyes:  Negative for blurred vision.  Respiratory:  Negative for cough, shortness of breath, wheezing and stridor.   Cardiovascular:  Negative for chest pain, palpitations, orthopnea and PND.  Gastrointestinal:  Negative for abdominal pain, blood in stool, bowel incontinence, constipation, diarrhea and nausea.  Genitourinary:  Negative for bladder incontinence, dysuria, flank pain, frequency, hematuria, urgency and vaginal discharge.  Musculoskeletal:  Positive for back pain. Negative for arthralgias, myalgias and neck pain.  Skin:  Negative for rash.  Neurological:  Negative for dizziness, weakness, headaches and paresthesias.  Hematological:  Negative for adenopathy. Does not bruise/bleed easily.  Psychiatric/Behavioral:  Negative for dysphoric mood. The patient is not  nervous/anxious.     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  Current Meds  Medication Sig   amLODipine (NORVASC) 10 MG tablet Take 10 mg by mouth daily.   aspirin 81 MG tablet Take 1 tablet by mouth daily.   carvedilol (COREG) 3.125 MG tablet Take 1 tablet by mouth 2 (two) times daily. cardio   colchicine 0.6 MG tablet TAKE 1 TABLET(0.6 MG) BY MOUTH DAILY   diltiazem (DILACOR XR) 240 MG 24 hr capsule Take 1 capsule (240 mg total) by mouth daily.   glucose blood (ACCU-CHEK AVIVA PLUS) test strip USE TWICE DAILY AS DIRECTED   metFORMIN (GLUCOPHAGE) 500 MG tablet TAKE 1 TABLET TWICE DAILY (Patient taking differently: 500 mg. TAKE 1 TABLETin morning and 2 at night/ Dr Gershon Crane)   simvastatin (ZOCOR) 20 MG tablet Take 1 tablet (20 mg total) by mouth daily.   tiZANidine (ZANAFLEX) 4 MG tablet Take 1 tablet (4 mg total) by mouth at bedtime.   Current Facility-Administered Medications for the 12/02/22 encounter (Office Visit) with [name], MD  Medication   albuterol (PROVENTIL) (2.5 MG/3ML) 0.083% nebulizer solution 2.5 mg       12/02/2022    9:44 AM 08/27/2022    3:03 PM 07/12/2022   10:38 AM 06/01/2022    9:45 AM  GAD 7 : Generalized Anxiety Score  Nervous, Anxious, on Edge 0 0 0 0   Control/stop worrying 0 0 0 0  Worry too much - different things 0 0 0 0  Trouble relaxing 0 0 0 0  Restless 0 0 0 0  Easily annoyed or irritable 0 0 0 0  Afraid - awful might happen 0 0 0 0  Total GAD 7 Score 0 0 0 0  Anxiety Difficulty Not difficult at all Not difficult at all Not difficult at all Not difficult at all       12/02/2022    9:43 AM 08/27/2022    3:03 PM 07/21/2022    9:02 AM  Depression screen PHQ 2/9  Decreased Interest 0 0 0  Down, Depressed, Hopeless 0 0 0  PHQ - 2 Score 0 0 0  Altered sleeping 0 0 0  Tired, decreased energy 0 0 0  Change in appetite 0 0 0  Feeling bad or failure about yourself  0 0 0  Trouble concentrating 0 0 0  Moving slowly or fidgety/restless 0 0 0  Suicidal thoughts 0 0 0  PHQ-9 Score 0 0 0  Difficult doing work/chores Not difficult  at all Not difficult at all Not difficult at all    BP Readings from Last 3 Encounters:  12/02/22 132/78  08/27/22 128/70  07/30/22 (!) 130/58    Physical Exam Vitals and nursing note reviewed.  Constitutional:      Appearance: She is well-developed.  HENT:     Head: Normocephalic.     Right Ear: External ear normal.     Left Ear: External ear normal.  Eyes:     General: Lids are everted, no foreign bodies appreciated. No scleral icterus.       Left eye: No foreign body or hordeolum.     Conjunctiva/sclera: Conjunctivae normal.     Right eye: Right conjunctiva is not injected.     Left eye: Left conjunctiva is not injected.     Pupils: Pupils are equal, round, and reactive to light.  Neck:     Thyroid: No thyromegaly.     Vascular: No JVD.     Trachea: No tracheal deviation.  Cardiovascular:     Rate and Rhythm: Normal rate and regular rhythm.     Heart sounds: Normal heart sounds, S1 normal and S2 normal. No murmur heard.    No systolic murmur is present.     No diastolic murmur is present.     No friction rub. No gallop.  Pulmonary:     Effort: Pulmonary effort is normal. No  respiratory distress.     Breath sounds: Normal breath sounds. No wheezing or rales.  Abdominal:     General: Bowel sounds are normal.     Palpations: Abdomen is soft. There is no mass.     Tenderness: There is no abdominal tenderness. There is no guarding or rebound.  Musculoskeletal:        General: No tenderness. Normal range of motion.     Cervical back: Normal range of motion and neck supple.  Lymphadenopathy:     Cervical: No cervical adenopathy.  Skin:    General: Skin is warm.     Findings: No rash.  Neurological:     Mental Status: She is alert and oriented to person, place, and time.     Cranial Nerves: No cranial nerve deficit.     Deep Tendon Reflexes: Reflexes normal.  Psychiatric:        Mood and Affect: Mood is not anxious or depressed.     Wt Readings from Last 3 Encounters:  12/02/22 142 lb (64.4 kg)  08/27/22 148 lb (67.1 kg)  07/30/22 140 lb (63.5 kg)    BP 132/78   Pulse 75   Ht 5\' 6"  (1.676 m)   Wt 142 lb (64.4 kg)   SpO2 97%   BMI 22.92 kg/m   Assessment and Plan: 1. Essential hypertension Chronic.  Controlled.  Stable.  Blood pressure 132/78.  Asymptomatic.  Tolerating medication well.  Continue diltiazem XR to 40 mg once a day. - diltiazem (DILACOR XR) 240 MG 24 hr capsule; Take 1 capsule (240 mg total) by mouth daily.  Dispense: 90 capsule; Refill: 1  2. Familial multiple lipoprotein-type hyperlipidemia Chronic.  Controlled.  Stable.  Will check lipid panel for current level of LDL and continue simvastatin 20 mg once a day. - simvastatin (ZOCOR) 20 MG tablet; Take 1 tablet (20 mg total) by mouth daily.  Dispense: 90 tablet; Refill: 1 - Lipid Panel With LDL/HDL Ratio  3. Lumbar facet arthropathy Chronic.  Controlled.  Stable.  Pretty much with control with Tylenol.  But patient continues  to take tizanidine 4 mg nightly. - tiZANidine (ZANAFLEX) 4 MG tablet; Take 1 tablet (4 mg total) by mouth at bedtime.  Dispense: 90 tablet; Refill: 1  4.  Lumbar degenerative disc disease As noted above. - tiZANidine (ZANAFLEX) 4 MG tablet; Take 1 tablet (4 mg total) by mouth at bedtime.  Dispense: 90 tablet; Refill: 1  5. Need for influenza vaccination Discussed and administered - Flu Vaccine Trivalent High Dose (Fluad)     [name], MD

## 2022-12-03 LAB — LIPID PANEL WITH LDL/HDL RATIO
Cholesterol, Total: 128 mg/dL (ref 100–199)
HDL: 47 mg/dL (ref >39)
LDL Chol Calc (NIH): 65 mg/dL (ref 0–99)
LDL/HDL Ratio: 1.4 ratio (ref 0.0–3.2)
Triglycerides: 84 mg/dL (ref 0–149)
VLDL Cholesterol Cal: 16 mg/dL (ref 5–40)

## 2022-12-12 ENCOUNTER — Emergency Department: Payer: Medicare HMO

## 2022-12-12 ENCOUNTER — Other Ambulatory Visit: Payer: Self-pay

## 2022-12-12 ENCOUNTER — Emergency Department
Admission: EM | Admit: 2022-12-12 | Discharge: 2022-12-12 | Disposition: A | Discharge status: Home / Self Care | Payer: Medicare HMO | Attending: Emergency Medicine | Admitting: Emergency Medicine

## 2022-12-12 DIAGNOSIS — R059 Cough, unspecified: Secondary | ICD-10-CM | POA: Diagnosis not present

## 2022-12-12 DIAGNOSIS — Z1152 Encounter for screening for COVID-19: Secondary | ICD-10-CM | POA: Insufficient documentation

## 2022-12-12 DIAGNOSIS — J209 Acute bronchitis, unspecified: Secondary | ICD-10-CM | POA: Insufficient documentation

## 2022-12-12 DIAGNOSIS — E119 Type 2 diabetes mellitus without complications: Secondary | ICD-10-CM | POA: Insufficient documentation

## 2022-12-12 DIAGNOSIS — Z7982 Long term (current) use of aspirin: Secondary | ICD-10-CM | POA: Diagnosis not present

## 2022-12-12 DIAGNOSIS — J0100 Acute maxillary sinusitis, unspecified: Secondary | ICD-10-CM | POA: Diagnosis not present

## 2022-12-12 DIAGNOSIS — J029 Acute pharyngitis, unspecified: Secondary | ICD-10-CM | POA: Diagnosis present

## 2022-12-12 DIAGNOSIS — Z7984 Long term (current) use of oral hypoglycemic drugs: Secondary | ICD-10-CM | POA: Diagnosis not present

## 2022-12-12 LAB — GROUP A STREP BY PCR: Group A Strep by PCR: NOT DETECTED

## 2022-12-12 LAB — SARS CORONAVIRUS 2 BY RT PCR: SARS Coronavirus 2 by RT PCR: NEGATIVE

## 2022-12-12 MED ORDER — ACETAMINOPHEN 325 MG PO TABS
650.0000 mg | ORAL_TABLET | Freq: Once | ORAL | Status: AC
  Administered 2022-12-12: 650 mg via ORAL
  Filled 2022-12-12: qty 2

## 2022-12-12 MED ORDER — DOXYCYCLINE HYCLATE 100 MG PO CAPS: 100.0000 mg | ORAL_CAPSULE | Freq: Two times a day (BID) | ORAL | 0 refills | Status: AC

## 2022-12-12 NOTE — ED Notes (Signed)
See triage notes. Patient c/o sore throat and cough for a few weeks.

## 2022-12-12 NOTE — ED Provider Notes (Signed)
[place] EMERGENCY DEPARTMENT AT [facility] Provider Note   CSN: [id number] Arrival date & time: 12/12/22  1357     History  Chief Complaint  Patient presents with   Sore Throat   Cough    April [name] is a 82 y.o. female with past medical history of hyperlipidemia and diabetes presents to the emergency department for evaluation of cough, sore throat, sinus pain and pressure.  Patient has had 2 weeks of cough, nasal congestion.  She tried nasal spray with little relief.  She denies any chest pain, shortness of breath, wheezing or chest tightness.  She mostly complains of right maxillary pain and a dry cough that persist.  She has not had any fevers, chills or bodyaches.  She denies any headache, vision changes, weakness, difficulty swallowing.    HPI     Home Medications Prior to Admission medications   Medication Sig Start Date End Date Taking? Authorizing Provider  doxycycline (VIBRAMYCIN) 100 MG capsule Take 1 capsule (100 mg total) by mouth 2 (two) times daily for 10 days. 12/12/22 12/22/22 Yes Evon Slack, PA-C  amLODipine (NORVASC) 10 MG tablet Take 10 mg by mouth daily.    [provider]  aspirin 81 MG tablet Take 1 tablet by mouth daily.    [provider]  carvedilol (COREG) 3.125 MG tablet Take 1 tablet by mouth 2 (two) times daily. cardio 09/23/21   [provider]  colchicine 0.6 MG tablet TAKE 1 TABLET(0.6 MG) BY MOUTH DAILY 09/27/22   [name], MD  diltiazem (DILACOR XR) 240 MG 24 hr capsule Take 1 capsule (240 mg total) by mouth daily. 12/02/22   [name], MD  glucose blood (ACCU-CHEK AVIVA PLUS) test strip USE TWICE DAILY AS DIRECTED 07/31/18   [provider]  metFORMIN (GLUCOPHAGE) 500 MG tablet TAKE 1 TABLET TWICE DAILY Patient taking differently: 500 mg. TAKE 1 TABLETin morning and 2 at night/ Dr Gershon Crane 09/24/19   [name], MD  simvastatin (ZOCOR) 20 MG tablet Take 1 tablet (20 mg total)  by mouth daily. 12/02/22   [name], MD  tiZANidine (ZANAFLEX) 4 MG tablet Take 1 tablet (4 mg total) by mouth at bedtime. 12/02/22   [name], MD      Allergies    Patient has no known allergies.    Review of Systems   Review of Systems  Physical Exam Updated Vital Signs BP (!) 145/60   Pulse 69   Temp 98.4 F (36.9 C)   Resp 18   SpO2 99%  Physical Exam Constitutional:      Appearance: She is well-developed.  HENT:     Head: Normocephalic and atraumatic.     Ears:     Comments: Positive maxillary sinus tenderness bilaterally.  No pharyngeal erythema or exudates bilaterally.  Uvula is midline.  No tender anterior cervical lymphadenopathy    Nose: Congestion present.     Mouth/Throat:     Pharynx: Uvula midline. No pharyngeal swelling, oropharyngeal exudate, posterior oropharyngeal erythema or uvula swelling.     Tonsils: No tonsillar exudate or tonsillar abscesses.  Eyes:     Conjunctiva/sclera: Conjunctivae normal.  Cardiovascular:     Rate and Rhythm: Normal rate.     Heart sounds: Normal heart sounds.  Pulmonary:     Effort: Pulmonary effort is normal. No respiratory distress.  Abdominal:     General: Bowel sounds are normal. There is no distension.  Palpations: Abdomen is soft. There is no mass.     Tenderness: There is no abdominal tenderness. There is no rebound.  Musculoskeletal:        General: Normal range of motion.     Cervical back: Normal range of motion.  Skin:    General: Skin is warm.     Capillary Refill: Capillary refill takes less than 2 seconds.     Findings: No rash.  Neurological:     General: No focal deficit present.     Mental Status: She is alert and oriented to person, place, and time.  Psychiatric:        Mood and Affect: Mood normal.        Behavior: Behavior normal.        Thought Content: Thought content normal.     ED Results / Procedures / Treatments   Labs (all labs ordered are listed, but only abnormal  results are displayed) Labs Reviewed  SARS CORONAVIRUS 2 BY RT PCR  GROUP A STREP BY PCR    EKG None  Radiology DG Chest 2 View  Result Date: 12/12/2022 CLINICAL DATA:  Cough for 2 weeks. EXAM: CHEST - 2 VIEW COMPARISON:  02/11/2018 FINDINGS: The heart size and mediastinal contours are within normal limits. Both lungs are clear. Mild thoracic spine degenerative changes again noted. IMPRESSION: No active cardiopulmonary disease. Electronically Signed   By: Danae Orleans M.D.   On: 12/12/2022 15:12    Procedures Procedures    Medications Ordered in ED Medications  acetaminophen (TYLENOL) tablet 650 mg (650 mg Oral Given 12/12/22 1436)    ED Course/ Medical Decision Making/ A&P                                 Medical Decision Making Amount and/or Complexity of Data Reviewed Radiology: ordered.  Risk OTC drugs.   82 year old female with couple weeks of cough sinus pain and pressure.  Chest x-ray ordered and independently reviewed by me today show noes evidence of acute cardiopulmonary process.  She denies any fevers, chills.  She has positive maxillary sinus tenderness on the right side.  History and exam persistent with sinusitis with bronchitis.  She is given antibiotics, doxycycline to take twice daily for 10 days.  She will continue with nasal saline spray/nasal irrigation and Tylenol.  She understands signs symptoms to return to the ER for. Final Clinical Impression(s) / ED Diagnoses Final diagnoses:  Acute bronchitis, unspecified organism  Acute non-recurrent maxillary sinusitis    Rx / DC Orders ED Discharge Orders          Ordered    doxycycline (VIBRAMYCIN) 100 MG capsule  2 times daily        12/12/22 1531              Ronnette Juniper 12/12/22 1533    Jene Every, MD 12/12/22 1601

## 2022-12-12 NOTE — ED Triage Notes (Addendum)
Pt comes  with c/o sore throat and cough for few weeks. Pt states nasal congestion and stuffiness. Pt denies any sob or cp. Pt states her head hurts and pain down side of face and neck.

## 2022-12-12 NOTE — Discharge Instructions (Addendum)
Please continue with nasal spray.  You may perform nasal saline rinse or Nettie pot irrigation.  Take antibiotic as prescribed.  Return to the ER for any fevers increasing pain worsening symptoms or any urgent changes in your health.

## 2022-12-15 ENCOUNTER — Other Ambulatory Visit: Payer: Self-pay

## 2022-12-15 ENCOUNTER — Telehealth: Payer: Self-pay | Admitting: Family Medicine

## 2022-12-15 DIAGNOSIS — Z1231 Encounter for screening mammogram for malignant neoplasm of breast: Secondary | ICD-10-CM

## 2022-12-15 NOTE — Telephone Encounter (Deleted)
Referral Request - Did the patient discuss referral with their provider in the last year? No , she sates she gets letter in mail for her to get annual mammo (If No - schedule appointment) (If Yes - send message)  Appointment offered? {yes/no:20286}  Type of order/referral and detailed reason for visit: ***  Preference of office, provider, location: ***  If referral order, have you been seen by this specialty before? {yes/no:20286} (If Yes, this issue or another issue? When? Where?  Can we respond through MyChart? {yes/no:20286}

## 2022-12-15 NOTE — Telephone Encounter (Signed)
Copied from CRM [id number]. Topic: General - Other >> Dec 15, 2022  9:17 AM Turkey B wrote: Reason for CRM: pt called in for referral for annual mammogram. I let her know appt needs to be made, but she refused that and  said she would make the appt herself

## 2023-01-04 ENCOUNTER — Encounter: Payer: Self-pay | Admitting: Family Medicine

## 2023-01-04 ENCOUNTER — Ambulatory Visit (INDEPENDENT_AMBULATORY_CARE_PROVIDER_SITE_OTHER): Payer: Medicare HMO | Admitting: Family Medicine

## 2023-01-04 ENCOUNTER — Ambulatory Visit: Payer: Self-pay | Admitting: Surgery

## 2023-01-04 VITALS — BP 138/58 | HR 81 | Temp 98.4°F | Ht 66.0 in | Wt 141.0 lb

## 2023-01-04 DIAGNOSIS — K6289 Other specified diseases of anus and rectum: Secondary | ICD-10-CM | POA: Diagnosis not present

## 2023-01-04 DIAGNOSIS — K625 Hemorrhage of anus and rectum: Secondary | ICD-10-CM

## 2023-01-04 NOTE — Progress Notes (Signed)
Date:  01/04/2023   Name:  April [name]   DOB:  [birth year]   MRN:  [id number]   Chief Complaint: Ear Pain (X2 months, getting worse, no fever, no headaches, no other symptoms, sore throat, hurts to swallow sometimes) and Sore Throat  Rectal Bleeding  The current episode started more than 2 weeks ago. The onset was sudden. The problem occurs occasionally. The problem has been gradually worsening. The pain is moderate. The stool is described as soft. Associated symptoms include hemorrhoids and rectal pain. Pertinent negatives include no fever, no abdominal pain, no diarrhea, no hematemesis, no hematuria, no vaginal bleeding and no chest pain.    Lab Results  Component Value Date   NA 141 06/01/2022   K 4.5 06/01/2022   CO2 22 06/01/2022   GLUCOSE 200 (H) 06/01/2022   BUN 15 06/01/2022   CREATININE 1.14 (H) 06/01/2022   CALCIUM 9.7 06/01/2022   EGFR 48 (L) 06/01/2022   GFRNONAA 51 (L) 09/24/2019   Lab Results  Component Value Date   CHOL 128 12/02/2022   HDL 47 12/02/2022   LDLCALC 65 12/02/2022   TRIG 84 12/02/2022   CHOLHDL 3.1 09/24/2019   Lab Results  Component Value Date   TSH 0.98 07/24/2021   Lab Results  Component Value Date   HGBA1C 6.6 04/13/2022   Lab Results  Component Value Date   WBC 7.4 08/29/2017   HGB 10.7 (L) 08/29/2017   HCT 32.6 (L) 08/29/2017   MCV 88.6 08/29/2017   PLT 284 08/29/2017   Lab Results  Component Value Date   ALT 7 06/01/2022   AST 15 06/01/2022   ALKPHOS 66 06/01/2022   BILITOT 0.4 06/01/2022   No results found for: "25OHVITD2", "25OHVITD3", "VD25OH"   Review of Systems  Constitutional:  Negative for fever.  Cardiovascular:  Negative for chest pain, palpitations and leg swelling.  Gastrointestinal:  Positive for anal bleeding, hematochezia, hemorrhoids and rectal pain. Negative for abdominal pain, diarrhea and hematemesis.  Genitourinary:  Negative for frequency, hematuria and vaginal bleeding.    Patient Active  Problem List   Diagnosis Date Noted   Lumbar facet arthropathy 04/08/2022   Lumbar degenerative disc disease 04/08/2022   Elevated transaminase level 04/10/2019   Primary osteoarthritis of left knee 10/06/2016   Type 2 diabetes mellitus without complications (HCC) 06/20/2014   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   Alimentary obesity 06/20/2014   Essential hypertension 06/20/2014   Routine sports examination 06/20/2014   Health examination of defined subpopulation 06/20/2014   Hyperlipidemia due to type 2 diabetes mellitus (HCC) 06/20/2014    No Known Allergies  Past Surgical History:  Procedure Laterality Date   APPENDECTOMY     gallstones  06/12/2009    Social History   Tobacco Use   Smoking status: Former   Smokeless tobacco: Never  Vaping Use   Vaping status: Never Used  Substance Use Topics   Alcohol use: No    Alcohol/week: 0.0 standard drinks of alcohol   Drug use: No     Medication list has been reviewed and updated.  Current Meds  Medication Sig   Accu-Chek Softclix Lancets lancets    amLODipine (NORVASC) 10 MG tablet Take 10 mg by mouth daily.   aspirin 81 MG tablet Take 1 tablet by mouth daily.   carvedilol (COREG) 3.125 MG tablet Take 1 tablet by mouth 2 (two) times daily. cardio   colchicine 0.6 MG tablet TAKE 1 TABLET(0.6 MG) BY MOUTH DAILY  diltiazem (DILACOR XR) 240 MG 24 hr capsule Take 1 capsule (240 mg total) by mouth daily.   glucose blood (ACCU-CHEK AVIVA PLUS) test strip USE TWICE DAILY AS DIRECTED   lisinopril-hydrochlorothiazide (ZESTORETIC) 20-25 MG tablet Take 1 tablet by mouth daily.   metFORMIN (GLUCOPHAGE) 500 MG tablet TAKE 1 TABLET TWICE DAILY (Patient taking differently: 500 mg. TAKE 1 TABLETin morning and 2 at night/ Dr Gershon Crane)   simvastatin (ZOCOR) 20 MG tablet Take 1 tablet (20 mg total) by mouth daily.   tiZANidine (ZANAFLEX) 4 MG tablet Take 1 tablet (4 mg total) by mouth at bedtime.   Current  Facility-Administered Medications for the 01/04/23 encounter (Office Visit) with [name], MD  Medication   albuterol (PROVENTIL) (2.5 MG/3ML) 0.083% nebulizer solution 2.5 mg       01/04/2023   10:44 AM 12/02/2022    9:44 AM 08/27/2022    3:03 PM 07/12/2022   10:38 AM  GAD 7 : Generalized Anxiety Score  Nervous, Anxious, on Edge 0 0 0 0  Control/stop worrying 0 0 0 0  Worry too much - different things 0 0 0 0  Trouble relaxing 0 0 0 0  Restless 0 0 0 0  Easily annoyed or irritable 0 0 0 0  Afraid - awful might happen 0 0 0 0  Total GAD 7 Score 0 0 0 0  Anxiety Difficulty Not difficult at all Not difficult at all Not difficult at all Not difficult at all       01/04/2023   10:44 AM 12/02/2022    9:43 AM 08/27/2022    3:03 PM  Depression screen PHQ 2/9  Decreased Interest 0 0 0  Down, Depressed, Hopeless 0 0 0  PHQ - 2 Score 0 0 0  Altered sleeping 0 0 0  Tired, decreased energy 0 0 0  Change in appetite 0 0 0  Feeling bad or failure about yourself  0 0 0  Trouble concentrating 0 0 0  Moving slowly or fidgety/restless 0 0 0  Suicidal thoughts 0 0 0  PHQ-9 Score 0 0 0  Difficult doing work/chores Not difficult at all Not difficult at all Not difficult at all    BP Readings from Last 3 Encounters:  01/04/23 (!) 138/58  12/12/22 (!) 145/60  12/02/22 132/78    Physical Exam Vitals and nursing note reviewed.  Constitutional:      Appearance: She is well-developed.  HENT:     Head: Normocephalic.     Right Ear: External ear normal.     Left Ear: External ear normal.  Eyes:     General: Lids are everted, no foreign bodies appreciated. No scleral icterus.       Left eye: No foreign body or hordeolum.     Conjunctiva/sclera: Conjunctivae normal.     Right eye: Right conjunctiva is not injected.     Left eye: Left conjunctiva is not injected.     Pupils: Pupils are equal, round, and reactive to light.  Neck:     Thyroid: No thyromegaly.     Vascular: No JVD.      Trachea: No tracheal deviation.  Cardiovascular:     Rate and Rhythm: Normal rate and regular rhythm.     Heart sounds: Normal heart sounds. No murmur heard.    No friction rub. No gallop.  Pulmonary:     Effort: Pulmonary effort is normal. No respiratory distress.     Breath sounds: Normal breath sounds. No  wheezing or rales.  Abdominal:     General: Bowel sounds are normal.     Palpations: Abdomen is soft. There is no mass.     Tenderness: There is no abdominal tenderness. There is no guarding or rebound.  Genitourinary:    Rectum: Tenderness and external hemorrhoid present.     Comments: Firmness with exquisite tenderness of the left perianal side but I was unable to penetrate more than a centimeter into the rectal opening without pain.  My concern is that there is a perirectal abscess and the vicinity or possibility of a perirectal mass.  We will obtain a CBC to see if the white count is elevated if so we will call patient. Musculoskeletal:        General: No tenderness. Normal range of motion.     Cervical back: Normal range of motion and neck supple.  Lymphadenopathy:     Cervical: No cervical adenopathy.  Skin:    General: Skin is warm.     Findings: No rash.  Neurological:     Mental Status: She is alert and oriented to person, place, and time.     Cranial Nerves: No cranial nerve deficit.     Deep Tendon Reflexes: Reflexes normal.  Psychiatric:        Mood and Affect: Mood is not anxious or depressed.     Wt Readings from Last 3 Encounters:  01/04/23 141 lb (64 kg)  12/02/22 142 lb (64.4 kg)  08/27/22 148 lb (67.1 kg)    BP (!) 138/58   Pulse 81   Temp 98.4 F (36.9 C) (Oral)   Ht 5\' 6"  (1.676 m)   Wt 141 lb (64 kg)   SpO2 96%   BMI 22.76 kg/m   Assessment and Plan:  1. Mass of perirectal soft tissue Patient has a firmness possible mass perirectal soft tissue which may represent either perirectal abscess versus rectal mass or inflamed external  hemorrhoid.  I am unable to do a rectal exam due to significant tenderness and limited opening of the anal opening.  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  We are awaiting results of his CBC and if elevated white count I will once again try to get the patient to change her mind. - Ambulatory referral to General Surgery  2. Rectal bleeding Patient has rectal bleeding which began about 2 weeks ago along with the discomfort which has been increasing.  My concern is abscess versus mass versus inflamed external hemorrhoid.  If white count is elevated I will call the patient back and likely seen you should go to the ER afterwards. - CBC with Differential/Platelet    [name], MD

## 2023-01-05 ENCOUNTER — Emergency Department
Admission: EM | Admit: 2023-01-05 | Discharge: 2023-01-06 | Discharge status: Against Medical Advice | Payer: Medicare HMO | Attending: Emergency Medicine | Admitting: Emergency Medicine

## 2023-01-05 ENCOUNTER — Other Ambulatory Visit: Payer: Self-pay

## 2023-01-05 DIAGNOSIS — Z5321 Procedure and treatment not carried out due to patient leaving prior to being seen by health care provider: Secondary | ICD-10-CM | POA: Insufficient documentation

## 2023-01-05 DIAGNOSIS — R799 Abnormal finding of blood chemistry, unspecified: Secondary | ICD-10-CM | POA: Diagnosis not present

## 2023-01-05 LAB — BASIC METABOLIC PANEL
Anion gap: 8 (ref 5–15)
BUN: 14 mg/dL (ref 8–23)
CO2: 24 mmol/L (ref 22–32)
Calcium: 9.3 mg/dL (ref 8.9–10.3)
Chloride: 104 mmol/L (ref 98–111)
Creatinine, Ser: 1.18 mg/dL — ABNORMAL HIGH (ref 0.44–1.00)
GFR, Estimated: 46 mL/min — ABNORMAL LOW (ref >60)
Glucose, Bld: 115 mg/dL — ABNORMAL HIGH (ref 70–99)
Potassium: 4 mmol/L (ref 3.5–5.1)
Sodium: 136 mmol/L (ref 135–145)

## 2023-01-05 LAB — CBC WITH DIFFERENTIAL/PLATELET
Abs Immature Granulocytes: 0.01 10*3/uL (ref 0.00–0.07)
Basophils Absolute: 0 10*3/uL (ref 0.0–0.2)
Basophils Absolute: 0 10*3/uL (ref 0.0–0.1)
Basophils Relative: 0 %
Basos: 0 %
EOS (ABSOLUTE): 0.2 10*3/uL (ref 0.0–0.4)
Eos: 3 %
Eosinophils Absolute: 0.2 10*3/uL (ref 0.0–0.5)
Eosinophils Relative: 2 %
HCT: 30.4 % — ABNORMAL LOW (ref 36.0–46.0)
Hematocrit: 27.1 % — ABNORMAL LOW (ref 34.0–46.6)
Hemoglobin: 8.4 g/dL — ABNORMAL LOW (ref 11.1–15.9)
Hemoglobin: 9.4 g/dL — ABNORMAL LOW (ref 12.0–15.0)
Immature Grans (Abs): 0 10*3/uL (ref 0.0–0.1)
Immature Granulocytes: 0 %
Immature Granulocytes: 0 %
Lymphocytes Absolute: 2 10*3/uL (ref 0.7–3.1)
Lymphocytes Relative: 39 %
Lymphs Abs: 2.5 10*3/uL (ref 0.7–4.0)
Lymphs: 33 %
MCH: 27.1 pg (ref 26.6–33.0)
MCH: 26.8 pg (ref 26.0–34.0)
MCHC: 31 g/dL — ABNORMAL LOW (ref 31.5–35.7)
MCHC: 30.9 g/dL (ref 30.0–36.0)
MCV: 87 fL (ref 79–97)
MCV: 86.6 fL (ref 80.0–100.0)
Monocytes Absolute: 0.5 10*3/uL (ref 0.1–0.9)
Monocytes Absolute: 0.5 10*3/uL (ref 0.1–1.0)
Monocytes Relative: 7 %
Monocytes: 8 %
Neutro Abs: 3.4 10*3/uL (ref 1.7–7.7)
Neutrophils Absolute: 3.4 10*3/uL (ref 1.4–7.0)
Neutrophils Relative %: 52 %
Neutrophils: 56 %
Platelets: 222 10*3/uL (ref 150–450)
Platelets: 246 10*3/uL (ref 150–400)
RBC: 3.1 x10E6/uL — ABNORMAL LOW (ref 3.77–5.28)
RBC: 3.51 MIL/uL — ABNORMAL LOW (ref 3.87–5.11)
RDW: 13.5 % (ref 11.7–15.4)
RDW: 13.6 % (ref 11.5–15.5)
WBC: 6 10*3/uL (ref 3.4–10.8)
WBC: 6.5 10*3/uL (ref 4.0–10.5)
nRBC: 0 % (ref 0.0–0.2)

## 2023-01-05 LAB — LACTIC ACID, PLASMA: Lactic Acid, Venous: 1.1 mmol/L (ref 0.5–1.9)

## 2023-01-05 NOTE — ED Triage Notes (Signed)
Pt. To ED c/o abnormal labs from PCP. Pt. States she was seen for rectal wound yesterday, and had blood drawn. Pt. States PCP called her and told her to go to ER. Pt. Does not know why, states her daughter spoke with the dr. This RN attempted to contact pt's daughter, unable to reach her.

## 2023-01-05 NOTE — Progress Notes (Signed)
Called pt on both numbers listed.  KP

## 2023-01-10 ENCOUNTER — Telehealth: Payer: Self-pay | Admitting: *Deleted

## 2023-01-10 NOTE — Telephone Encounter (Signed)
Pt  lab requesting lab results per  ED visit on  01/05/23 on 01/10/23. Pt verbalized PCP recommended to go to ED and due to no beds available and taking so long patient left. No review from MD documented per lab results noted. Please advise regarding recent ED labs . Patient reports she is still having some "bleeding back there" . Advised patient if rectal bleeding continues call back or go to ED.

## 2023-01-11 ENCOUNTER — Ambulatory Visit: Payer: Medicare HMO | Admitting: Surgery

## 2023-01-11 ENCOUNTER — Encounter: Payer: Self-pay | Admitting: Surgery

## 2023-01-11 VITALS — BP 144/63 | HR 88 | Temp 98.0°F | Ht 66.0 in | Wt 138.4 lb

## 2023-01-11 DIAGNOSIS — C499 Malignant neoplasm of connective and soft tissue, unspecified: Secondary | ICD-10-CM | POA: Diagnosis not present

## 2023-01-11 DIAGNOSIS — K6289 Other specified diseases of anus and rectum: Secondary | ICD-10-CM | POA: Insufficient documentation

## 2023-01-11 NOTE — Progress Notes (Signed)
Patient ID: April [name], female   DOB: 08/09/1940, 82 y.o.   MRN: [id number]  Chief Complaint: Bright red blood with bowel movements for 1 month  History of Present Illness April [name] is a 82 y.o. female with the above complaint, reportedly had significant pain on exam at Dr. Barnett Applebaum office.  Denies fevers or chills.  Denies pain or pressure.  Denies constipation.  She reports daily bowel movements without difficulty.  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  No one presented with her today.  She reports that blood does stain her underwear, however she does not have significant clots or bleeding between bowel movements.  Past Medical History Past Medical History:  Diagnosis Date   Diabetes mellitus without complication (HCC)    Gout    Hyperlipidemia    Hypertension       Past Surgical History:  Procedure Laterality Date   APPENDECTOMY     gallstones  06/12/2009    No Known Allergies  Current Outpatient Medications  Medication Sig Dispense Refill   Accu-Chek Softclix Lancets lancets      amLODipine (NORVASC) 10 MG tablet Take 10 mg by mouth daily.     aspirin 81 MG tablet Take 1 tablet by mouth daily.     carvedilol (COREG) 3.125 MG tablet Take 1 tablet by mouth 2 (two) times daily. cardio     colchicine 0.6 MG tablet TAKE 1 TABLET(0.6 MG) BY MOUTH DAILY 10 tablet 1   glucose blood (ACCU-CHEK AVIVA PLUS) test strip USE TWICE DAILY AS DIRECTED     lisinopril-hydrochlorothiazide (ZESTORETIC) 20-25 MG tablet Take 1 tablet by mouth daily.     metFORMIN (GLUCOPHAGE) 500 MG tablet TAKE 1 TABLET TWICE DAILY (Patient taking differently: 500 mg. TAKE 1 TABLETin morning and 2 at night/ Dr Gershon Crane) 180 tablet 1   simvastatin (ZOCOR) 20 MG tablet Take 1 tablet (20 mg total) by mouth daily. 90 tablet 1   tiZANidine (ZANAFLEX) 4 MG tablet Take 1 tablet (4 mg total) by mouth at bedtime. 90 tablet 1   Current Facility-Administered Medications  Medication Dose Route Frequency Provider  Last Rate Last Admin   albuterol (PROVENTIL) (2.5 MG/3ML) 0.083% nebulizer solution 2.5 mg  2.5 mg Nebulization Once [name], MD        Family History Family History  Problem Relation Age of Onset   Breast cancer Neg Hx       Social History Social History   Tobacco Use   Smoking status: Former    Passive exposure: Past   Smokeless tobacco: Never  Vaping Use   Vaping status: Never Used  Substance Use Topics   Alcohol use: No    Alcohol/week: 0.0 standard drinks of alcohol   Drug use: No        Review of Systems  Constitutional: Negative.   HENT: Negative.    Eyes: Negative.   Respiratory: Negative.    Cardiovascular: Negative.   Gastrointestinal:  Positive for blood in stool.  Genitourinary: Negative.   Skin: Negative.   Neurological: Negative.   Psychiatric/Behavioral: Negative.       Physical Exam Blood pressure (!) 144/63, pulse 88, temperature 98 F (36.7 C), height 5\' 6"  (1.676 m), weight 138 lb 6.4 oz (62.8 kg), SpO2 97%. Last Weight  Most recent update: 01/11/2023  9:17 AM    Weight  62.8 kg (138 lb 6.4 oz)             CONSTITUTIONAL: Well developed, and nourished, appropriately  responsive and aware without distress.   EYES: Sclera non-icteric.   EARS, NOSE, MOUTH AND THROAT:  The oropharynx is clear. Oral mucosa is pink and moist.    Hearing is intact to voice.  NECK: Trachea is midline, and there is no jugular venous distension.  LYMPH NODES:  Lymph nodes in the neck are not appreciated. RESPIRATORY:  Lungs are clear, and breath sounds are equal bilaterally.  Normal respiratory effort without pathologic use of accessory muscles. CARDIOVASCULAR: Heart is regular in rate and rhythm.   Well perfused.  GI: The abdomen is  soft, nontender, and nondistended.  GU: Caryl Lyn present as chaperone.  There is an obvious ulcerated mass on the left side of the patient's anus approximately 2.5 cm in diameter, it seems to extend inwardly along the anal  canal.  It is tender on gross palpation, seems mobile from the subcutaneous tissues.  However quite fixed and tender on attempts to evaluate it within the anal canal.  So an internal exam/DRE is quite limited.  She did have a soft external hemorrhoidal tag anteriorly.  After discussion, she consented verbally to allow [place] to proceed with local anesthesia with lidocaine, application of a clamp across a portion of the mass, and excision of a portion of the mass for permanent section.  Upon removal of the clamp, it appeared we had adequate hemostasis.  Dry gauze was applied.  She appeared to tolerate it well. MUSCULOSKELETAL:  Symmetrical muscle tone appreciated in all four extremities.    SKIN: Skin turgor is normal. No pathologic skin lesions appreciated.  NEUROLOGIC:  Motor and sensation appear grossly normal.  Cranial nerves are grossly without defect. PSYCH:  Alert and oriented to person, place and time. Affect is appropriate for situation.  Data Reviewed I have personally reviewed what is currently available of the patient's imaging, recent labs and medical records.   Labs:     Latest Ref Rng & Units 01/05/2023    3:19 PM 01/04/2023   12:05 PM 08/29/2017   12:37 PM  CBC  WBC 4.0 - 10.5 K/uL 6.5  6.0  7.4   Hemoglobin 12.0 - 15.0 g/dL 9.4  8.4  16.1   Hematocrit 36.0 - 46.0 % 30.4  27.1  32.6   Platelets 150 - 400 K/uL 246  222  284       Latest Ref Rng & Units 01/05/2023    3:19 PM 06/01/2022   10:17 AM 07/24/2021   12:00 AM  CMP  Glucose 70 - 99 mg/dL 096  045    BUN 8 - 23 mg/dL 14  15  17       Creatinine 0.44 - 1.00 mg/dL 4.09  8.11  1.1      Sodium 135 - 145 mmol/L 136  141  141      Potassium 3.5 - 5.1 mmol/L 4.0  4.5  4.0      Chloride 98 - 111 mmol/L 104  104    CO2 22 - 32 mmol/L 24  22    Calcium 8.9 - 10.3 mg/dL 9.3  9.7  9.9      Total Protein 6.0 - 8.5 g/dL  7.0    Total Bilirubin 0.0 - 1.2 mg/dL  0.4    Alkaline Phos 44 - 121 IU/L  66    AST 0 - 40 IU/L  15    ALT  0 - 32 IU/L  7       This result is from an external source.  Imaging: Radiological images reviewed:   Within last 24 hrs: No results found.  Assessment    Anal lesion, highly suspicious for anal carcinoma.  Proximal extension difficult to discern on clinical exam. 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  Will follow-up permanent section/pathology report. Likely will need staging imaging, chest/abdomen/pelvic CT.  Her groin nodes were not evaluated today. Anticipate referrals to oncology and radiation oncology. Will ensure she gets back to see [place] in 2 weeks. Face-to-face time spent with the patient and accompanying care providers(if present) was 45 minutes, with more than 50% of the time spent counseling, educating, and coordinating care of the patient.    These notes generated with voice recognition software. I apologize for typographical errors.  Campbell Lerner M.D., FACS 01/11/2023, 10:29 AM

## 2023-01-11 NOTE — Patient Instructions (Addendum)
We have removed a piece of tissue from the mass in your rectum. You may bleed from this for a few days. Keep dry gauze in this area until it stops.   We have referred you to Oncology for consultation based on what we are seeing. We will know more once the pathology comes back They will call you to schedule an appointment.    Follow up here in 2 weeks.

## 2023-01-12 ENCOUNTER — Other Ambulatory Visit: Payer: Self-pay

## 2023-01-12 ENCOUNTER — Telehealth: Payer: Self-pay

## 2023-01-12 DIAGNOSIS — C20 Malignant neoplasm of rectum: Secondary | ICD-10-CM

## 2023-01-12 DIAGNOSIS — C210 Malignant neoplasm of anus, unspecified: Secondary | ICD-10-CM

## 2023-01-12 LAB — SURGICAL PATHOLOGY

## 2023-01-12 NOTE — Telephone Encounter (Signed)
Called patient with PET scan date/time. The patient is scheduled for a PET scan at [facility] on Friday Dec 6th. She will [facility] through [facility] at 10:30 am. She will hold her AM dose of Metformin and have nothing but water by mouth for 6 hours before her scan. The patient is aware of date, time, and instructions.

## 2023-01-14 ENCOUNTER — Telehealth: Payer: Self-pay | Admitting: Oncology

## 2023-01-14 ENCOUNTER — Encounter
Admission: RE | Admit: 2023-01-14 | Discharge: 2023-01-14 | Disposition: A | Discharge status: Home / Self Care | Payer: Medicare HMO | Source: Ambulatory Visit | Attending: Surgery | Admitting: Surgery

## 2023-01-14 DIAGNOSIS — C20 Malignant neoplasm of rectum: Secondary | ICD-10-CM | POA: Diagnosis not present

## 2023-01-14 DIAGNOSIS — C210 Malignant neoplasm of anus, unspecified: Secondary | ICD-10-CM | POA: Insufficient documentation

## 2023-01-14 LAB — GLUCOSE, CAPILLARY: Glucose-Capillary: 82 mg/dL (ref 70–99)

## 2023-01-14 MED ORDER — FLUDEOXYGLUCOSE F - 18 (FDG) INJECTION
7.2800 | Freq: Once | INTRAVENOUS | Status: AC | PRN
  Administered 2023-01-14: 7.28 via INTRAVENOUS

## 2023-01-14 NOTE — Telephone Encounter (Signed)
Detailed VM left with patient about her appointments here on 12/10. Requested she call back to confirm.

## 2023-01-17 ENCOUNTER — Telehealth: Payer: Self-pay

## 2023-01-17 NOTE — Telephone Encounter (Signed)
Called radiology for PET read prior to upcoming appointment.

## 2023-01-18 ENCOUNTER — Ambulatory Visit
Admission: RE | Admit: 2023-01-18 | Discharge: 2023-01-18 | Disposition: A | Discharge status: Home / Self Care | Payer: Medicare HMO | Source: Ambulatory Visit | Attending: Radiation Oncology | Admitting: Radiation Oncology

## 2023-01-18 ENCOUNTER — Encounter: Payer: Self-pay | Admitting: Oncology

## 2023-01-18 ENCOUNTER — Inpatient Hospital Stay (HOSPITAL_BASED_OUTPATIENT_CLINIC_OR_DEPARTMENT_OTHER): Payer: Medicare HMO | Admitting: Oncology

## 2023-01-18 ENCOUNTER — Telehealth: Payer: Self-pay | Admitting: Pharmacist

## 2023-01-18 ENCOUNTER — Inpatient Hospital Stay: Payer: Medicare HMO

## 2023-01-18 ENCOUNTER — Encounter: Payer: Self-pay | Admitting: Radiation Oncology

## 2023-01-18 ENCOUNTER — Other Ambulatory Visit: Payer: Self-pay

## 2023-01-18 VITALS — BP 157/65 | HR 64 | Temp 98.2°F | Resp 16 | Ht 66.0 in | Wt 137.3 lb

## 2023-01-18 DIAGNOSIS — E042 Nontoxic multinodular goiter: Secondary | ICD-10-CM | POA: Insufficient documentation

## 2023-01-18 DIAGNOSIS — D649 Anemia, unspecified: Secondary | ICD-10-CM | POA: Insufficient documentation

## 2023-01-18 DIAGNOSIS — E041 Nontoxic single thyroid nodule: Secondary | ICD-10-CM | POA: Insufficient documentation

## 2023-01-18 DIAGNOSIS — I700 Atherosclerosis of aorta: Secondary | ICD-10-CM | POA: Insufficient documentation

## 2023-01-18 DIAGNOSIS — E049 Nontoxic goiter, unspecified: Secondary | ICD-10-CM

## 2023-01-18 DIAGNOSIS — C218 Malignant neoplasm of overlapping sites of rectum, anus and anal canal: Secondary | ICD-10-CM | POA: Insufficient documentation

## 2023-01-18 DIAGNOSIS — C210 Malignant neoplasm of anus, unspecified: Secondary | ICD-10-CM

## 2023-01-18 DIAGNOSIS — Z7982 Long term (current) use of aspirin: Secondary | ICD-10-CM | POA: Insufficient documentation

## 2023-01-18 DIAGNOSIS — C20 Malignant neoplasm of rectum: Secondary | ICD-10-CM | POA: Insufficient documentation

## 2023-01-18 DIAGNOSIS — I119 Hypertensive heart disease without heart failure: Secondary | ICD-10-CM | POA: Insufficient documentation

## 2023-01-18 DIAGNOSIS — Z510 Encounter for antineoplastic radiation therapy: Secondary | ICD-10-CM | POA: Insufficient documentation

## 2023-01-18 DIAGNOSIS — M109 Gout, unspecified: Secondary | ICD-10-CM | POA: Insufficient documentation

## 2023-01-18 DIAGNOSIS — Z791 Long term (current) use of non-steroidal anti-inflammatories (NSAID): Secondary | ICD-10-CM | POA: Insufficient documentation

## 2023-01-18 DIAGNOSIS — I10 Essential (primary) hypertension: Secondary | ICD-10-CM | POA: Insufficient documentation

## 2023-01-18 DIAGNOSIS — Z79899 Other long term (current) drug therapy: Secondary | ICD-10-CM | POA: Insufficient documentation

## 2023-01-18 DIAGNOSIS — R1310 Dysphagia, unspecified: Secondary | ICD-10-CM | POA: Insufficient documentation

## 2023-01-18 DIAGNOSIS — E119 Type 2 diabetes mellitus without complications: Secondary | ICD-10-CM | POA: Insufficient documentation

## 2023-01-18 DIAGNOSIS — E785 Hyperlipidemia, unspecified: Secondary | ICD-10-CM | POA: Insufficient documentation

## 2023-01-18 DIAGNOSIS — R948 Abnormal results of function studies of other organs and systems: Secondary | ICD-10-CM | POA: Diagnosis not present

## 2023-01-18 DIAGNOSIS — Z87891 Personal history of nicotine dependence: Secondary | ICD-10-CM | POA: Insufficient documentation

## 2023-01-18 DIAGNOSIS — Z7984 Long term (current) use of oral hypoglycemic drugs: Secondary | ICD-10-CM | POA: Insufficient documentation

## 2023-01-18 LAB — CMP (CANCER CENTER ONLY)
ALT: 7 U/L (ref 0–44)
AST: 16 U/L (ref 15–41)
Albumin: 3.9 g/dL (ref 3.5–5.0)
Alkaline Phosphatase: 40 U/L (ref 38–126)
Anion gap: 7 (ref 5–15)
BUN: 19 mg/dL (ref 8–23)
CO2: 25 mmol/L (ref 22–32)
Calcium: 9.2 mg/dL (ref 8.9–10.3)
Chloride: 108 mmol/L (ref 98–111)
Creatinine: 1.1 mg/dL — ABNORMAL HIGH (ref 0.44–1.00)
GFR, Estimated: 50 mL/min — ABNORMAL LOW (ref >60)
Glucose, Bld: 135 mg/dL — ABNORMAL HIGH (ref 70–99)
Potassium: 3.9 mmol/L (ref 3.5–5.1)
Sodium: 140 mmol/L (ref 135–145)
Total Bilirubin: 0.7 mg/dL (ref <1.2)
Total Protein: 7.4 g/dL (ref 6.5–8.1)

## 2023-01-18 LAB — CBC WITH DIFFERENTIAL/PLATELET
Abs Immature Granulocytes: 0.03 10*3/uL (ref 0.00–0.07)
Basophils Absolute: 0 10*3/uL (ref 0.0–0.1)
Basophils Relative: 0 %
Eosinophils Absolute: 0.1 10*3/uL (ref 0.0–0.5)
Eosinophils Relative: 2 %
HCT: 28.2 % — ABNORMAL LOW (ref 36.0–46.0)
Hemoglobin: 8.7 g/dL — ABNORMAL LOW (ref 12.0–15.0)
Immature Granulocytes: 0 %
Lymphocytes Relative: 30 %
Lymphs Abs: 2.1 10*3/uL (ref 0.7–4.0)
MCH: 27.4 pg (ref 26.0–34.0)
MCHC: 30.9 g/dL (ref 30.0–36.0)
MCV: 88.7 fL (ref 80.0–100.0)
Monocytes Absolute: 0.5 10*3/uL (ref 0.1–1.0)
Monocytes Relative: 8 %
Neutro Abs: 4.1 10*3/uL (ref 1.7–7.7)
Neutrophils Relative %: 60 %
Platelets: 284 10*3/uL (ref 150–400)
RBC: 3.18 MIL/uL — ABNORMAL LOW (ref 3.87–5.11)
RDW: 14 % (ref 11.5–15.5)
WBC: 6.9 10*3/uL (ref 4.0–10.5)
nRBC: 0 % (ref 0.0–0.2)

## 2023-01-18 LAB — IRON AND TIBC
Iron: 53 ug/dL (ref 28–170)
Saturation Ratios: 19 % (ref 10.4–31.8)
TIBC: 279 ug/dL (ref 250–450)
UIBC: 226 ug/dL

## 2023-01-18 LAB — FERRITIN: Ferritin: 63 ng/mL (ref 11–307)

## 2023-01-18 LAB — HIV ANTIBODY (ROUTINE TESTING W REFLEX): HIV Screen 4th Generation wRfx: NONREACTIVE

## 2023-01-18 MED ORDER — CAPECITABINE 500 MG PO TABS
1000.0000 mg | ORAL_TABLET | Freq: Two times a day (BID) | ORAL | 0 refills | Status: DC
  Filled 2023-01-27: qty 120, 30d supply, fill #0

## 2023-01-18 NOTE — Telephone Encounter (Signed)
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  CMP from 01/18/23 assessed, SCr 1.1mg /dL, CrCl ~32 ml/min. Patient will require renal dose adjustment. Prescription dose and frequency assessed.   Current medication list in Epic reviewed, no DDIs with capecitabine identified.  Evaluated chart and no patient barriers to medication adherence identified.   Prescription has been e-scribed to the [facility] for benefits analysis and approval.  [facility] will continue to follow for insurance authorization, copayment issues, initial counseling and start date.   Remi Haggard, PharmD, BCPS, BCOP, CPP Hematology/Oncology Clinical Pharmacist Practitioner [facility] [phone]  01/18/2023 11:45 AM

## 2023-01-18 NOTE — Progress Notes (Signed)
Introduced Visual merchandiser and provided contact information for future needs. Will assist with coordination for ENT biopsy and IR thyroid biopsy.

## 2023-01-18 NOTE — Consult Note (Signed)
NEW PATIENT EVALUATION  Name: April [name]  MRN: [id number]  Date:   01/18/2023     DOB: 1940-05-14   This 82 y.o. 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   PREVIOUS INVESTIGATIONS:  PET CT scan reviewed Clinical notes reviewed Pathology reports reviewed  HPI: Patient is a 82 year old female who presented with several month history of blood per rectum.  She was seen by Dr. Yetta Barre who noted a anal mass.  Evaluated by Dr. Dolores Frame who noted an obvious ulcerated mass in the left side of the patient's anus approximate 2.5 cm in diameter.  It was fixed.  Biopsy was positive for invasive moderate differentiated squamous cell carcinoma.  PET CT scan was performed showing a constellation of findings including hypermetabolic activity in the anorectal region extending inward although difficult to measure I would assume it is greater than 5 cm.  There was no evidence of distant metastatic disease or hypermetabolic to be in pelvic lymph nodes or inguinal nodes.  She also had hypermetabolic right oropharyngeal mass with hypermetabolic right level 2 cervical nodes compatible with a primary head and neck cancer.  She also had a large thyroid with an area of hypermetabolic activity with recommending thyroid biopsy and ultrasound.  She is seen today for evaluation.  She states she is no longer bleeding.  She is having no significant anal pain at this time.  Patient states she is having no significant head and neck pain or dysphagia.  PLANNED TREATMENT REGIMEN: Concurrent chemoradiation  PAST MEDICAL HISTORY:  has a past medical history of Diabetes  mellitus without complication (HCC), Gout, Hyperlipidemia, and Hypertension.    PAST SURGICAL HISTORY:  Past Surgical History:  Procedure Laterality Date   APPENDECTOMY     gallstones  06/12/2009    FAMILY HISTORY: family history is not on file.  SOCIAL HISTORY:  reports that she has quit smoking. She has been exposed to tobacco smoke. She has never used smokeless tobacco. She reports that she does not drink alcohol and does not use drugs.  ALLERGIES: Patient has no known allergies.  MEDICATIONS:  Current Outpatient Medications  Medication Sig Dispense Refill   Accu-Chek Softclix Lancets lancets      amLODipine (NORVASC) 10 MG tablet Take 10 mg by mouth daily.     aspirin 81 MG tablet Take 1 tablet by mouth daily.     carvedilol (COREG) 3.125 MG tablet Take 1 tablet by mouth 2 (two) times daily. cardio     colchicine 0.6 MG tablet TAKE 1 TABLET(0.6 MG) BY MOUTH DAILY 10 tablet 1   glucose blood (ACCU-CHEK AVIVA PLUS) test strip USE TWICE DAILY AS DIRECTED     lisinopril-hydrochlorothiazide (ZESTORETIC) 20-25 MG tablet Take 1 tablet by mouth daily.     metFORMIN (GLUCOPHAGE) 500 MG tablet TAKE 1 TABLET TWICE DAILY (Patient taking differently: 500 mg. TAKE 1 TABLETin morning and 2 at night/ Dr Gershon Crane) 180 tablet 1   simvastatin (ZOCOR) 20 MG tablet Take 1 tablet (20 mg total) by mouth daily. 90 tablet 1   tiZANidine (ZANAFLEX) 4 MG tablet Take 1 tablet (4 mg total) by mouth at bedtime.  90 tablet 1   Current Facility-Administered Medications  Medication Dose Route Frequency Provider Last Rate Last Admin   albuterol (PROVENTIL) (2.5 MG/3ML) 0.083% nebulizer solution 2.5 mg  2.5 mg Nebulization Once [name], MD        ECOG PERFORMANCE STATUS:  1 - Symptomatic but completely ambulatory  REVIEW OF SYSTEMS: Patient denies any weight loss, fatigue, weakness, fever, chills or night sweats. Patient denies any loss of vision, blurred vision. Patient denies any ringing  of the ears  or hearing loss. No irregular heartbeat. Patient denies heart murmur or history of fainting. Patient denies any chest pain or pain radiating to her upper extremities. Patient denies any shortness of breath, difficulty breathing at night, cough or hemoptysis. Patient denies any swelling in the lower legs. Patient denies any nausea vomiting, vomiting of blood, or coffee ground material in the vomitus. Patient denies any stomach pain. Patient states has had normal bowel movements no significant constipation or diarrhea. Patient denies any dysuria, hematuria or significant nocturia. Patient denies any problems walking, swelling in the joints or loss of balance. Patient denies any skin changes, loss of hair or loss of weight. Patient denies any excessive worrying or anxiety or significant depression. Patient denies any problems with insomnia. Patient denies excessive thirst, polyuria, polydipsia. Patient denies any swollen glands, patient denies easy bruising or easy bleeding. Patient denies any recent infections, allergies or URI. Patient "s visual fields have not changed significantly in recent time.   PHYSICAL EXAM: BP (!) 157/65   Pulse 64   Temp 98.2 F (36.8 C)   Resp 16   Ht 5\' 6"  (1.676 m)   Wt 137 lb 4.8 oz (62.3 kg)   BMI 22.16 kg/m  There is fullness in the right oral pharyngeal region consistent with probable head and neck cancer.  She does have some fullness in the right cervical lymph nodes.  On anal examination is a protruding necrotic mass consistent with anal cancer.  No inguinal adenopathy is identified.  Well-developed well-nourished patient in NAD. HEENT reveals PERLA, EOMI, discs not visualized.  Oral cavity is clear. No oral mucosal lesions are identified. Neck is clear without evidence of cervical or supraclavicular adenopathy. Lungs are clear to A&P. Cardiac examination is essentially unremarkable with regular rate and rhythm without murmur rub or thrill. Abdomen is benign with no  organomegaly or masses noted. Motor sensory and DTR levels are equal and symmetric in the upper and lower extremities. Cranial nerves II through XII are grossly intact. Proprioception is intact. No peripheral adenopathy or edema is identified. No motor or sensory levels are noted. Crude visual fields are within normal range.  LABORATORY DATA: Allergy reports reviewed    RADIOLOGY RESULTS: PET CT scan reviewed compatible with above-stated findings   IMPRESSION: Stage IIb squamous cell carcinoma the anus as well as probably primary head and neck cancer as well as thyroid cancer in 82 year old female  PLAN: This time elect to go ahead with concurrent combined s modality treatment with chemotherapy and radiation therapy.  Would plan on delivering 6 Gray using IMRT radiation therapy to her anal lesion treating her pelvic nodes and inguinal nodes to 45 Gray using IMRT dose painting technique.  Risks and benefits of treatment occluding diarrhea increased lower urinary tract symptoms irritation of the skin in the anal region fatigue skin reaction all were discussed in detail with the patient.  I have personally set up and ordered CT simulation.  In the meantime should be worked up  by ENT for diagnosis of her primary head and neck cancer and possible thyroid cancer.  Patient comprehends my recommendations well.  I would like to take this opportunity to thank you for allowing me to participate in the care of your patient.Carmina Miller, MD

## 2023-01-18 NOTE — Progress Notes (Signed)
[facility]  [phone] Fax:([phone]  ID: April [name] OB: Oct 22, 1940  MR#: [id number]  AOZ#:[id number]  Patient Care Team: [name], MD as PCP - General (Family Medicine) Sherlon Handing, MD as Consulting Physician (Internal Medicine) Benita Gutter, RN as Oncology Nurse Navigator Orlie Dakin, Tollie Pizza, MD as Consulting Physician (Oncology)  CHIEF COMPLAINT: Stage IIIa squamous cell carcinoma of the anus, likely head and neck cancer, as well as thyroid nodule.  INTERVAL HISTORY: Patient is an 82 year old female who recently presented with complaint of rectal bleeding.  Subsequent examination revealed a large mass emerging from her anus that was consistent with a squamous cell carcinoma.  She also complains of mild dysphagia.  Currently she feels well and is asymptomatic.  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  She denies any further bleeding.  She does not complain of pain today.  She has no neurologic complaints.  She denies any recent fevers or illnesses.  She has a good appetite and denies weight loss.  She has no chest pain, shortness of breath, cough, or hemoptysis.  She denies any nausea,, vomiting, constipation, or diarrhea.  She has no melena or hematochezia.  She has no urinary complaints.  Patient offers no further specific complaints today.  REVIEW OF SYSTEMS:   Review of Systems  Constitutional: Negative.  Negative for fever, malaise/fatigue and weight loss.  HENT:  Negative for sore throat.   Respiratory: Negative.  Negative for cough, hemoptysis and shortness of breath.   Cardiovascular: Negative.  Negative for chest pain and leg swelling.  Gastrointestinal: Negative.  Negative for abdominal pain, blood in stool and melena.  Genitourinary: Negative.  Negative for dysuria.  Musculoskeletal: Negative.  Negative for back pain.  Skin: Negative.  Negative for rash.  Neurological:  Negative.  Negative for dizziness, focal weakness, weakness and headaches.  Psychiatric/Behavioral: Negative.  The patient is not nervous/anxious.     As per HPI. Otherwise, a complete review of systems is negative.  PAST MEDICAL HISTORY: Past Medical History:  Diagnosis Date   Diabetes mellitus without complication (HCC)    Gout    Hyperlipidemia    Hypertension     PAST SURGICAL HISTORY: Past Surgical History:  Procedure Laterality Date   APPENDECTOMY     gallstones  06/12/2009    FAMILY HISTORY: Family History  Problem Relation Age of Onset   Breast cancer Neg Hx     ADVANCED DIRECTIVES (Y/N):  N  HEALTH MAINTENANCE: Social History   Tobacco Use   Smoking status: Former    Passive exposure: Past   Smokeless tobacco: Never  Vaping Use   Vaping status: Never Used  Substance Use Topics   Alcohol use: No    Alcohol/week: 0.0 standard drinks of alcohol   Drug use: No     Colonoscopy:  PAP:  Bone density:  Lipid panel:  No Known Allergies  Current Outpatient Medications  Medication Sig Dispense Refill   Accu-Chek Softclix Lancets lancets      amLODipine (NORVASC) 10 MG tablet Take 10 mg by mouth daily.     aspirin 81 MG tablet Take 1 tablet by mouth daily.     carvedilol (COREG) 3.125 MG tablet Take 1 tablet by mouth 2 (two) times daily. cardio     colchicine 0.6 MG tablet TAKE 1 TABLET(0.6 MG) BY MOUTH DAILY 10 tablet 1   glucose blood (ACCU-CHEK AVIVA PLUS) test strip USE TWICE DAILY AS DIRECTED  lisinopril-hydrochlorothiazide (ZESTORETIC) 20-25 MG tablet Take 1 tablet by mouth daily.     metFORMIN (GLUCOPHAGE) 500 MG tablet TAKE 1 TABLET TWICE DAILY (Patient taking differently: 500 mg. TAKE 1 TABLETin morning and 2 at night/ Dr Gershon Crane) 180 tablet 1   simvastatin (ZOCOR) 20 MG tablet Take 1 tablet (20 mg total) by mouth daily. 90 tablet 1   tiZANidine (ZANAFLEX) 4 MG tablet Take 1 tablet (4 mg total) by mouth at bedtime. 90 tablet 1   Current  Facility-Administered Medications  Medication Dose Route Frequency Provider Last Rate Last Admin   albuterol (PROVENTIL) (2.5 MG/3ML) 0.083% nebulizer solution 2.5 mg  2.5 mg Nebulization Once [name], MD        OBJECTIVE: There were no vitals filed for this visit.   There is no height or weight on file to calculate BMI.    ECOG FS:0 - Asymptomatic  General: Well-developed, well-nourished, no acute distress. Eyes: Pink conjunctiva, anicteric sclera. HEENT: Normocephalic, moist mucous membranes. Lungs: No audible wheezing or coughing. Heart: Regular rate and rhythm. Abdomen: Soft, nontender, no obvious distention. Musculoskeletal: No edema, cyanosis, or clubbing. Neuro: Alert, answering all questions appropriately. Cranial nerves grossly intact. Skin: No rashes or petechiae noted. Psych: Normal affect. Lymphatics: No cervical, calvicular, axillary or inguinal LAD.   LAB RESULTS:  Lab Results  Component Value Date   NA 140 01/18/2023   K 3.9 01/18/2023   CL 108 01/18/2023   CO2 25 01/18/2023   GLUCOSE 135 (H) 01/18/2023   BUN 19 01/18/2023   CREATININE 1.10 (H) 01/18/2023   CALCIUM 9.2 01/18/2023   PROT 7.4 01/18/2023   ALBUMIN 3.9 01/18/2023   AST 16 01/18/2023   ALT 7 01/18/2023   ALKPHOS 40 01/18/2023   BILITOT 0.7 01/18/2023   GFRNONAA 50 (L) 01/18/2023   GFRAA 59 (L) 09/24/2019    Lab Results  Component Value Date   WBC 6.9 01/18/2023   NEUTROABS 4.1 01/18/2023   HGB 8.7 (L) 01/18/2023   HCT 28.2 (L) 01/18/2023   MCV 88.7 01/18/2023   PLT 284 01/18/2023     STUDIES: NM PET Image Initial (PI) Skull Base To Thigh (F-18 FDG)  Result Date: 01/17/2023 CLINICAL DATA:  Initial treatment strategy for rectal cancer. EXAM: NUCLEAR MEDICINE PET SKULL BASE TO THIGH TECHNIQUE: 7.3 mCi F-18 FDG was injected intravenously. Full-ring PET imaging was performed from the skull base to thigh after the radiotracer. CT data was obtained and used for attenuation  correction and anatomic localization. Fasting blood glucose: 82 mg/dl COMPARISON:  None Available. FINDINGS: Mediastinal blood pool activity: SUV max 1.8 Liver activity: SUV max NA NECK: Right oropharyngeal mass measures approximately 1.6 x 2.3 cm (6/20), SUV max 20.7. Adjacent right level II lymph nodes with index lymph node measuring 7 mm (6/26), SUV max 3.4. Incidental CT findings: None. CHEST: Heterogeneous, enlarged and nodular thyroid with areas of hypermetabolism. Index lesion on the left, SUV max 9.5. No additional abnormal hypermetabolism. Incidental CT findings: Atherosclerotic calcification of the aorta, aortic valve and coronary arteries. Heart is enlarged. No pericardial or pleural effusion. ABDOMEN/PELVIS: Anorectal hypermetabolism, SUV max 11.4, with soft tissue fullness on CT. A discrete mass is difficult to measure without IV or enteric contrast. No perirectal hypermetabolic lymph nodes. No additional abnormal hypermetabolism. Incidental CT findings: Cholecystectomy. Low-attenuation lesions in the kidneys. No specific follow-up necessary. SKELETON: No abnormal hypermetabolism. Incidental CT findings: Degenerative changes in the spine. IMPRESSION: 1. 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 No  evidence of distant metastatic disease. 2. Hypermetabolic right oropharyngeal mass with hypermetabolic right level II cervical lymph nodes, compatible with primary head/neck cancer. 3. Heterogeneous, nodular and enlarged thyroid with areas of hypermetabolism. Recommend thyroid US and biopsy. (Ref: J Am Coll Radiol. 2015 Feb;12(2): 143-50). 4. Aortic atherosclerosis (ICD10-I70.0). Coronary artery calcification. Electronically Signed   By: Leanna Battles M.D.   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  PLAN:    Stage IIIa squamous cell carcinoma of the anus: PET  scan results from January 14, 2023 reviewed independently and report as above with a large anorectal lesion with significant hypermetabolism, but no hypermetabolic lymph nodes are noted.  Agree with radiation oncology assessment that lesion is likely greater than 5 cm increasing stage IIIa.  Patient will be benefit from daily XRT along with concurrent chemotherapy using IV mitomycin on days 1 and 29 along with oral Xeloda on the days of her surgery.  Simulation is planned for January 27, 2023.  Will plan to initiate day 1 of mitomycin on February 07, 2023.  Oral Xeloda will start on day 1 of XRT.  Appreciate clinical pharmacy input. Likely head and neck cancer: PET scan results as above.  Case discussed with ENT and referral has been made for biopsy.  If positive for malignancy, patient will likely require concurrent XRT and cisplatin for this lesion.  This will be initiated at the conclusion of her treatment of her anal cancer. Thyroid nodule with hypermetabolism: Will refer to interventional radiology for ultrasound and biopsy. Anemia: Patient's hemoglobin is 8.7 today.  Iron stores are within normal limits.  Monitor.  I spent a total of 60 minutes reviewing chart data, face-to-face evaluation with the patient, counseling and coordination of care as detailed above.  Patient expressed understanding and was in agreement with this plan. She also understands that She can call clinic at any time with any questions, concerns, or complaints.    Cancer Staging  Anal squamous cell carcinoma (HCC) Staging form: Anus, AJCC V9 - Clinical stage from 01/18/2023: Stage IIIA (cT3, cN0, cM0) - Signed by Jeralyn Ruths, MD on 01/18/2023 Stage prefix: Initial diagnosis   Jeralyn Ruths, MD   01/18/2023 3:11 PM

## 2023-01-18 NOTE — Progress Notes (Signed)
START ON PATHWAY REGIMEN - Anal Carcinoma     One cycle, concurrent with RT:     Capecitabine      Mitomycin   **Always confirm dose/schedule in your pharmacy ordering system**  Patient Characteristics: Anal Canal Tumors, Newly Diagnosed - Locoregional Disease (Clinical Staging) Therapeutic Status: Newly Diagnosed - Locoregional Disease (Clinical Staging) AJCC T Category: cT3 AJCC N Category: cN0 AJCC M Category: cM0 AJCC 9 Stage Grouping: IIIA Check here if patient was staged using an edition other than AJCC Staging 9th Edition: false Intent of Therapy: Curative Intent, Discussed with Patient

## 2023-01-19 ENCOUNTER — Other Ambulatory Visit (HOSPITAL_COMMUNITY): Payer: Self-pay

## 2023-01-19 ENCOUNTER — Other Ambulatory Visit: Payer: Self-pay | Admitting: Radiation Oncology

## 2023-01-19 ENCOUNTER — Encounter: Payer: Self-pay | Admitting: Oncology

## 2023-01-19 ENCOUNTER — Telehealth: Payer: Self-pay

## 2023-01-19 ENCOUNTER — Other Ambulatory Visit: Payer: Self-pay

## 2023-01-19 DIAGNOSIS — C801 Malignant (primary) neoplasm, unspecified: Secondary | ICD-10-CM

## 2023-01-19 DIAGNOSIS — D3702 Neoplasm of uncertain behavior of tongue: Secondary | ICD-10-CM | POA: Diagnosis not present

## 2023-01-19 DIAGNOSIS — E041 Nontoxic single thyroid nodule: Secondary | ICD-10-CM | POA: Diagnosis not present

## 2023-01-19 NOTE — Telephone Encounter (Signed)
Oral Oncology Patient Advocate Encounter  New authorization   Received notification that prior authorization for Capecitabine is required.   PA submitted on 01/19/23  Key [id number]  Status is pending     Ardeen Fillers, CPhT Oncology Pharmacy Patient Advocate  [facility]  [phone] (phone) [phone] (fax) 01/19/2023 8:48 AM

## 2023-01-19 NOTE — Telephone Encounter (Signed)
Oral Oncology Patient Advocate Encounter  Prior Authorization for Capecitabine has been approved.    PA# [id number]  Effective dates: 01/19/23 through 02/08/24  Patients co-pay is $12.00.    Ardeen Fillers, CPhT Oncology Pharmacy Patient Advocate  [facility]  [phone] (phone) [phone] (fax) 01/19/2023 9:50 AM

## 2023-01-20 ENCOUNTER — Other Ambulatory Visit: Payer: Medicare HMO

## 2023-01-20 ENCOUNTER — Ambulatory Visit
Admission: RE | Admit: 2023-01-20 | Discharge: 2023-01-20 | Disposition: A | Discharge status: Home / Self Care | Payer: Medicare HMO | Source: Ambulatory Visit | Attending: Family Medicine | Admitting: Family Medicine

## 2023-01-20 DIAGNOSIS — Z1231 Encounter for screening mammogram for malignant neoplasm of breast: Secondary | ICD-10-CM | POA: Diagnosis not present

## 2023-01-21 ENCOUNTER — Other Ambulatory Visit: Payer: Self-pay | Admitting: Unknown Physician Specialty

## 2023-01-21 ENCOUNTER — Telehealth: Payer: Self-pay

## 2023-01-21 NOTE — Telephone Encounter (Signed)
Per tumor board discussion, thyroid work up will be postponed for now.

## 2023-01-21 NOTE — Anesthesia Preprocedure Evaluation (Addendum)
Anesthesia Evaluation  Patient identified by MRN, date of birth, ID band Patient awake    Reviewed: Allergy & Precautions, H&P , NPO status , Patient's Chart, lab work & pertinent test results  Airway Mallampati: IV  TM Distance: >3 FB Neck ROM: Full  Mouth opening: Limited Mouth Opening  Dental no notable dental hx.    Pulmonary neg pulmonary ROS, former smoker   Pulmonary exam normal breath sounds clear to auscultation       Cardiovascular hypertension, Pt. on home beta blockers and Pt. on medications negative cardio ROS Normal cardiovascular exam Rhythm:Regular Rate:Normal     Neuro/Psych negative neurological ROS  negative psych ROS   GI/Hepatic negative GI ROS, Neg liver ROS,,,  Endo/Other  negative endocrine ROSdiabetes    Renal/GU negative Renal ROS  negative genitourinary   Musculoskeletal negative musculoskeletal ROS (+) Arthritis ,    Abdominal   Peds negative pediatric ROS (+)  Hematology negative hematology ROS (+)   Anesthesia Other Findings Oncology note 01-28-23 Stage IIIa squamous cell carcinoma of the anus, likely head and neck cancer, as well as thyroid nodule.  HTN Gout Diabetes mellitus SCC of anus and rectum Osteoarthritis Mild dysphagia  Reproductive/Obstetrics negative OB ROS                              Anesthesia Physical Anesthesia Plan  ASA: 3  Anesthesia Plan: General ETT   Post-op Pain Management:    Induction: Intravenous  PONV Risk Score and Plan:   Airway Management Planned: Oral ETT  Additional Equipment:   Intra-op Plan:   Post-operative Plan: Extubation in OR  Informed Consent: I have reviewed the patients History and Physical, chart, labs and discussed the procedure including the risks, benefits and alternatives for the proposed anesthesia with the patient or authorized representative who has indicated his/her understanding and  acceptance.     Dental Advisory Given  Plan Discussed with: Anesthesiologist, CRNA and Surgeon  Anesthesia Plan Comments: (Patient consented for risks of anesthesia including but not limited to:  - adverse reactions to medications - damage to eyes, teeth, lips or other oral mucosa - nerve damage due to positioning  - sore throat or hoarseness - Damage to heart, brain, nerves, lungs, other parts of body or loss of life  Patient voiced understanding and assent.)         Anesthesia Quick Evaluation

## 2023-01-24 ENCOUNTER — Other Ambulatory Visit: Payer: Medicare HMO

## 2023-01-24 ENCOUNTER — Encounter: Payer: Self-pay | Admitting: Unknown Physician Specialty

## 2023-01-25 ENCOUNTER — Ambulatory Visit (INDEPENDENT_AMBULATORY_CARE_PROVIDER_SITE_OTHER): Payer: Medicare HMO | Admitting: Surgery

## 2023-01-25 ENCOUNTER — Encounter: Payer: Self-pay | Admitting: Surgery

## 2023-01-25 VITALS — BP 160/69 | HR 76 | Temp 98.0°F | Ht 66.0 in | Wt 135.0 lb

## 2023-01-25 DIAGNOSIS — C210 Malignant neoplasm of anus, unspecified: Secondary | ICD-10-CM

## 2023-01-25 NOTE — Progress Notes (Signed)
Surgical Clinic Progress/Follow-up Note   HPI:  82 y.o. Female presents to clinic for follow-up , she presents today with her son Zoe Lan with her.   He has her upcoming appointments, readily available on his phone as he follows her MyChart. I discussed with them both her diagnosis of anal carcinoma, and the intended treatment with chemotherapy and radiation.  Patient reports  improvement/resolution of prior issues and has been tolerating regular diet with +flatus and normal BM's, denies N/V, fever/chills, CP, or SOB. She still is having minimal bleeding.  Review of Systems:  Constitutional: denies fever/chills  ENT: denies sore throat, hearing problems  Respiratory: denies shortness of breath, wheezing  Cardiovascular: denies chest pain, palpitations  Gastrointestinal: denies abdominal pain, N/V, or diarrhea/and bowel function as per interval history Skin: Denies any other rashes or skin discoloration as per interval history  Vital Signs:  BP (!) 160/69   Pulse 76   Temp 98 F (36.7 C)   Ht 5\' 6"  (1.676 m)   Wt 135 lb (61.2 kg)   SpO2 97%   BMI 21.79 kg/m    Physical Exam:  Constitutional:  -- Normalbody habitus  -- Awake, alert, and oriented x3  Pulmonary:  -- Breathing non-labored at rest  Gastrointestinal:  -- Soft and non-distended, non-tender GU  --exam deferred Musculoskeletal / Integumentary:  -- Wounds or skin discoloration: None appreciated -- Extremities: w/o lesion/or edema   Laboratory studies:   Hgb 8.7   FINAL DIAGNOSIS        1. Soft tissue, biopsy,  :       - INVASIVE MODERATELY DIFFERENTIATED SQUAMOUS CELL CARCINOMA       - SEE NOTE        Diagnosis Note : The patient's clinical history and physical exam findings of a       ulcerated, 2.5 cm anal mass are noted.  Dr. Oneita Kras reviewed the case and agrees       with the above diagnosis.  Dr. Claudine Mouton was notified via secure chat on       01/12/23.  Imaging:  CLINICAL DATA:  Initial treatment  strategy for rectal cancer.   EXAM: NUCLEAR MEDICINE PET SKULL BASE TO THIGH   TECHNIQUE: 7.3 mCi F-18 FDG was injected intravenously. Full-ring PET imaging was performed from the skull base to thigh after the radiotracer. CT data was obtained and used for attenuation correction and anatomic localization.   Fasting blood glucose: 82 mg/dl   COMPARISON:  None Available.   FINDINGS: Mediastinal blood pool activity: SUV max 1.8   Liver activity: SUV max NA   NECK:   Right oropharyngeal mass measures approximately 1.6 x 2.3 cm (6/20), SUV max 20.7. Adjacent right level II lymph nodes with index lymph node measuring 7 mm (6/26), SUV max 3.4.   Incidental CT findings:   None.   CHEST:   Heterogeneous, enlarged and nodular thyroid with areas of hypermetabolism. Index lesion on the left, SUV max 9.5. No additional abnormal hypermetabolism.   Incidental CT findings:   Atherosclerotic calcification of the aorta, aortic valve and coronary arteries. Heart is enlarged. No pericardial or pleural effusion.   ABDOMEN/PELVIS:   Anorectal hypermetabolism, SUV max 11.4, with soft tissue fullness on CT. A discrete mass is difficult to measure without IV or enteric contrast. No perirectal hypermetabolic lymph nodes. No additional abnormal hypermetabolism.   Incidental CT findings:   Cholecystectomy. Low-attenuation lesions in the kidneys. No specific follow-up necessary.   SKELETON:   No abnormal hypermetabolism.  Incidental CT findings:   Degenerative changes in the spine.   IMPRESSION: 1. 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 No evidence of distant metastatic disease. 2. Hypermetabolic right oropharyngeal mass with hypermetabolic right level II cervical lymph nodes, compatible with primary head/neck cancer. 3. Heterogeneous, nodular and enlarged thyroid with areas of hypermetabolism. Recommend thyroid US and biopsy.  (Ref: J Am Coll Radiol. 2015 Feb;12(2): 143-50). 4. Aortic atherosclerosis (ICD10-I70.0). Coronary artery calcification.     Electronically Signed   By: Leanna Battles M.D.   On: 01/17/2023 13:19  Assessment:  82 y.o. yo Female with a problem list including...  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  Plan:              - return to clinic as needed, instructed to call office if any questions or concerns   -As scheduled she has ENT follow-up for bx, radiation and heme/Onc follow-up.  All of the above recommendations were discussed with the patient and patient's family, and all of patient's and family's questions were answered to their expressed satisfaction.  These notes generated with voice recognition software. I apologize for typographical errors.  Campbell Lerner, MD, FACS [place]: [facility] General Surgery - Partnering for exceptional care. Office: [phone]

## 2023-01-25 NOTE — Progress Notes (Signed)
Pharmacist Chemotherapy Monitoring - Initial Assessment    Anticipated start date: 02/07/23   The following has been reviewed per standard work regarding the patient's treatment regimen: The patient's diagnosis, treatment plan and drug doses, and organ/hematologic function Lab orders and baseline tests specific to treatment regimen  The treatment plan start date, drug sequencing, and pre-medications Prior authorization status  Patient's documented medication list, including drug-drug interaction screen and prescriptions for anti-emetics and supportive care specific to the treatment regimen The drug concentrations, fluid compatibility, administration routes, and timing of the medications to be used The patient's access for treatment and lifetime cumulative dose history, if applicable  The patient's medication allergies and previous infusion related reactions, if applicable   Changes made to treatment plan:  N/A  Follow up needed:  N/A   Sharen Hones, PharmD, BCPS Clinical Pharmacist   01/25/2023  12:12 PM

## 2023-01-25 NOTE — Telephone Encounter (Signed)
Called patient to OnBoard. Left VM. I will continue to reach out to patient.   Call 1 - LVM 01/25/23   Ardeen Fillers, CPhT Oncology Pharmacy Patient Advocate  [facility]  [phone] (phone) [phone] (fax) 01/25/2023 1:41 PM

## 2023-01-27 ENCOUNTER — Other Ambulatory Visit: Payer: Self-pay

## 2023-01-27 ENCOUNTER — Encounter: Payer: Self-pay | Admitting: Oncology

## 2023-01-27 ENCOUNTER — Ambulatory Visit
Admission: RE | Admit: 2023-01-27 | Discharge: 2023-01-27 | Disposition: A | Discharge status: Home / Self Care | Payer: Medicare HMO | Source: Ambulatory Visit | Attending: Radiation Oncology | Admitting: Radiation Oncology

## 2023-01-27 ENCOUNTER — Other Ambulatory Visit (HOSPITAL_COMMUNITY): Payer: Self-pay

## 2023-01-27 ENCOUNTER — Inpatient Hospital Stay: Payer: Medicare HMO | Admitting: Pharmacist

## 2023-01-27 DIAGNOSIS — E119 Type 2 diabetes mellitus without complications: Secondary | ICD-10-CM | POA: Diagnosis not present

## 2023-01-27 DIAGNOSIS — M109 Gout, unspecified: Secondary | ICD-10-CM | POA: Diagnosis not present

## 2023-01-27 DIAGNOSIS — Z510 Encounter for antineoplastic radiation therapy: Secondary | ICD-10-CM | POA: Insufficient documentation

## 2023-01-27 DIAGNOSIS — D649 Anemia, unspecified: Secondary | ICD-10-CM | POA: Diagnosis not present

## 2023-01-27 DIAGNOSIS — C218 Malignant neoplasm of overlapping sites of rectum, anus and anal canal: Secondary | ICD-10-CM | POA: Diagnosis not present

## 2023-01-27 DIAGNOSIS — C210 Malignant neoplasm of anus, unspecified: Secondary | ICD-10-CM

## 2023-01-27 DIAGNOSIS — C801 Malignant (primary) neoplasm, unspecified: Secondary | ICD-10-CM

## 2023-01-27 DIAGNOSIS — C20 Malignant neoplasm of rectum: Secondary | ICD-10-CM | POA: Insufficient documentation

## 2023-01-27 DIAGNOSIS — R1310 Dysphagia, unspecified: Secondary | ICD-10-CM | POA: Diagnosis not present

## 2023-01-27 DIAGNOSIS — E041 Nontoxic single thyroid nodule: Secondary | ICD-10-CM | POA: Diagnosis not present

## 2023-01-27 MED ORDER — PROCHLORPERAZINE MALEATE 10 MG PO TABS: 10.0000 mg | ORAL_TABLET | Freq: Four times a day (QID) | ORAL | 1 refills | Status: DC | PRN

## 2023-01-27 MED ORDER — DICLOFENAC SODIUM 1 % EX GEL
CUTANEOUS | 0 refills | Status: DC
  Filled 2023-01-27: qty 200, 30d supply, fill #0

## 2023-01-27 NOTE — Progress Notes (Signed)
Research Note - Consent Authorization   Study Name: Evaluation of Topical Diclofenac Use on the Incidence and Severity of Hand Foot Syndrome in Patients with Breast and Gastrointestinal Malignancies Taking Capecitabine   IRB# [id number]   A summary of the study was presented to the patient, including potential risks and benefits from study participation, alternatives treatment options available other than study participation, how their confidentiality will be maintained, and who to contact if they had questions regarding their rights as a study participant or if they experience an injury associated with their participation. They were told that participation is completely voluntary and choosing not to participate would not impact care they would otherwise have access to. They were also told that even if they choose to participate, they can withdraw their consent to participate at any time in the future. Any questions the patient asked were addressed by a member of the research team.   After having all of their questions answered, the patient agreed to participate in the study by confirming their willingness to consent to participation verbally over the phone on 01/27/2023 at 1:46 PM.    Jerry Caras, PharmD PGY2 Oncology Pharmacy Resident  Principal Investigator  [id number]

## 2023-01-27 NOTE — Progress Notes (Signed)
Patient offered referral to diclofenac prophylaxis hand-foot syndrome research project. Patient agreed to referral.

## 2023-01-27 NOTE — Telephone Encounter (Signed)
Patient successfully OnBoarded and drug education provided by pharmacist. Medication scheduled to be shipped on Friday, 01/28/23, for delivery on Monday, 01/31/23, from [facility] to patient's address. Patient also knows to call me at [phone] with any questions or concerns regarding receiving medication or if there is any unexpected change in co-pay.    Ardeen Fillers, CPhT Oncology Pharmacy Patient Advocate  [facility]  [phone] (phone) [phone] (fax) 01/27/2023 12:15 PM

## 2023-01-27 NOTE — Progress Notes (Signed)
Clinical Pharmacist Practitioner Clinic [facility]  [phone] [phone]  Patient Care Team: [name], MD as PCP - General (Family Medicine) Sherlon Handing, MD as Consulting Physician (Internal Medicine) Benita Gutter, RN as Oncology Nurse Navigator Orlie Dakin, Tollie Pizza, MD as Consulting Physician (Oncology) Carmina Miller, MD as Consulting Physician (Radiation Oncology)   Name of the patient: April [name]  [id number]  [birth year]   Date of visit: 01/27/23  HPI: Patient is a 82 y.o. female with anal cancer. Planned treatment with capecitabine, mitomycin, and radiation. Planned start of radiation on 12/26.   Reason for Consult: Capecitabine oral chemotherapy education.   PAST MEDICAL HISTORY: Past Medical History:  Diagnosis Date   Diabetes mellitus without complication (HCC)    Gout    Hyperlipidemia    Hypertension    Wears dentures    full upper and lower    HEMATOLOGY/ONCOLOGY HISTORY:  Oncology History  Anal squamous cell carcinoma (HCC)  01/18/2023 Initial Diagnosis   Anal squamous cell carcinoma (HCC)   01/18/2023 Cancer Staging   Staging form: Anus, AJCC V9 - Clinical stage from 01/18/2023: Stage IIIA (cT3, cN0, cM0) - Signed by Jeralyn Ruths, MD on 01/18/2023 Stage prefix: Initial diagnosis   02/07/2023 -  Chemotherapy   Patient is on Treatment Plan : ANUS Mitomycin D1,29 + Capecitabine + XRT       ALLERGIES:  has no known allergies.  MEDICATIONS:  Current Outpatient Medications  Medication Sig Dispense Refill   Accu-Chek Softclix Lancets lancets      amLODipine (NORVASC) 10 MG tablet Take 10 mg by mouth daily.     aspirin 81 MG tablet Take 1 tablet by mouth daily.     capecitabine (XELODA) 500 MG tablet Take 2 tablets (1,000 mg total) by mouth 2 (two) times daily after a meal. Take Monday-Friday. Take only on days of radiation. 120 tablet 0   carvedilol (COREG) 3.125 MG tablet Take 1 tablet  by mouth 2 (two) times daily. cardio     colchicine 0.6 MG tablet TAKE 1 TABLET(0.6 MG) BY MOUTH DAILY 10 tablet 1   glucose blood (ACCU-CHEK AVIVA PLUS) test strip USE TWICE DAILY AS DIRECTED     lisinopril-hydrochlorothiazide (ZESTORETIC) 20-25 MG tablet Take 1 tablet by mouth daily.     metFORMIN (GLUCOPHAGE) 500 MG tablet TAKE 1 TABLET TWICE DAILY (Patient taking differently: 500 mg. TAKE 1 TABLETin morning and 2 at night/ Dr Gershon Crane) 180 tablet 1   simvastatin (ZOCOR) 20 MG tablet Take 1 tablet (20 mg total) by mouth daily. 90 tablet 1   tiZANidine (ZANAFLEX) 4 MG tablet Take 1 tablet (4 mg total) by mouth at bedtime. 90 tablet 1   Current Facility-Administered Medications  Medication Dose Route Frequency Provider Last Rate Last Admin   albuterol (PROVENTIL) (2.5 MG/3ML) 0.083% nebulizer solution 2.5 mg  2.5 mg Nebulization Once [name], MD        VITAL SIGNS: There were no vitals taken for this visit. There were no vitals filed for this visit.  Estimated body mass index is 21.79 kg/m as calculated from the following:   Height as of 01/25/23: 5\' 6"  (1.676 m).   Weight as of 01/25/23: 61.2 kg (135 lb).  LABS: CBC:    Component Value Date/Time   WBC 6.9 01/18/2023 1135   HGB 8.7 (L) 01/18/2023 1135   HGB 8.4 (L) 01/04/2023 1205   HCT 28.2 (L) 01/18/2023 1135   HCT 27.1 (L) 01/04/2023 1205  PLT 284 01/18/2023 1135   PLT 222 01/04/2023 1205   MCV 88.7 01/18/2023 1135   MCV 87 01/04/2023 1205   NEUTROABS 4.1 01/18/2023 1135   NEUTROABS 3.4 01/04/2023 1205   LYMPHSABS 2.1 01/18/2023 1135   LYMPHSABS 2.0 01/04/2023 1205   MONOABS 0.5 01/18/2023 1135   EOSABS 0.1 01/18/2023 1135   EOSABS 0.2 01/04/2023 1205   BASOSABS 0.0 01/18/2023 1135   BASOSABS 0.0 01/04/2023 1205   Comprehensive Metabolic Panel:    Component Value Date/Time   NA 140 01/18/2023 1136   NA 141 06/01/2022 1017   K 3.9 01/18/2023 1136   CL 108 01/18/2023 1136   CO2 25 01/18/2023 1136   BUN  19 01/18/2023 1136   BUN 15 06/01/2022 1017   CREATININE 1.10 (H) 01/18/2023 1136   GLUCOSE 135 (H) 01/18/2023 1136   CALCIUM 9.2 01/18/2023 1136   AST 16 01/18/2023 1136   ALT 7 01/18/2023 1136   ALKPHOS 40 01/18/2023 1136   BILITOT 0.7 01/18/2023 1136   PROT 7.4 01/18/2023 1136   PROT 7.0 06/01/2022 1017   ALBUMIN 3.9 01/18/2023 1136   ALBUMIN 4.2 06/01/2022 1017     Present during today's visit: patient only  Start plan: patient will start capecitabine with radiation on 02/03/23   Patient Education I spoke with patient for overview of new oral chemotherapy medication: capecitabine   Administration: Counseled patient on administration, dosing, side effects, monitoring, drug-food interactions, safe handling, storage, and disposal. Patient will take 2 tablets (1,000 mg total) by mouth 2 (two) times daily after a meal. Take Monday-Friday. Take only on days of radiation.  Side Effects: Side effects include but not limited to: diarrhea, hand-foot syndrome, mouth sores, edema, decreased wbc, fatigue, N/V Diarrhea: Patient knows to use loperamide as needed and call the office if they are having 4 or more loose stools per day. Hand-foot syndrome: Provided patient with bottle of [place] Smooth Extra Care 20. Patient knows to report any skin changes they notice.  Mouth sores: Patient knows to request magic mouthwash if needed.    Drug-drug Interactions (DDI): No current DDIs with capecitabine  Adherence: After discussion with patient no patient barriers to medication adherence identified.  Reviewed with patient importance of keeping a medication schedule and plan for any missed doses.  April [name] voiced understanding and appreciation. All questions answered. Medication handout provided.  Provided patient with Oral Chemotherapy Navigation Clinic phone number. Patient knows to call the office with questions or concerns. Oral Chemotherapy Navigation Clinic will continue to  follow.  Patient expressed understanding and was in agreement with this plan. She also understands that She can call clinic at any time with any questions, concerns, or complaints.   Medication Access Issues: Patient set up for delivery from [facility] (Specialty)  Follow-up plan: RTC as scheduled  Thank you for allowing me to participate in the care of this patient.   Time Total: 15 mins  Visit consisted of counseling and education on dealing with issues of symptom management in the setting of serious and potentially life-threatening illness.Greater than 50%  of this time was spent counseling and coordinating care related to the above assessment and plan.  Signed by: Remi Haggard, PharmD, BCPS, Nolon Bussing, CPP Hematology/Oncology Clinical Pharmacist Practitioner [facility] [phone]  01/27/2023 11:50 AM

## 2023-01-27 NOTE — Progress Notes (Signed)
Specialty Pharmacy Initial Fill Coordination Note  April [name] is a 82 y.o. female contacted today regarding initial fill of specialty medication(s) Capecitabine (XELODA)  Patient requested Delivery   Delivery date: 01/31/23   Verified address: [address] [place], [place] [address]  Medication will be filled on 01/28/23.   Patient is aware of $12.00 copayment. Patient requests bill to [facility].    Ardeen Fillers, CPhT Oncology Pharmacy Patient Advocate  [facility]  [phone] (phone) [phone] (fax) 01/27/2023 12:13 PM

## 2023-01-27 NOTE — Progress Notes (Signed)
Patient education documented in EPIC note on 01/27/23.

## 2023-01-28 ENCOUNTER — Ambulatory Visit: Payer: Medicare HMO | Admitting: Anesthesiology

## 2023-01-28 ENCOUNTER — Other Ambulatory Visit: Payer: Self-pay

## 2023-01-28 ENCOUNTER — Ambulatory Visit
Admission: RE | Admit: 2023-01-28 | Discharge: 2023-01-28 | Disposition: A | Discharge status: Home / Self Care | Payer: Medicare HMO | Attending: Unknown Physician Specialty | Admitting: Unknown Physician Specialty

## 2023-01-28 ENCOUNTER — Encounter: Payer: Self-pay | Admitting: Unknown Physician Specialty

## 2023-01-28 ENCOUNTER — Encounter
Admission: RE | Disposition: A | Discharge status: Home / Self Care | Payer: Self-pay | Source: Home / Self Care | Attending: Unknown Physician Specialty

## 2023-01-28 ENCOUNTER — Other Ambulatory Visit (HOSPITAL_COMMUNITY): Payer: Self-pay

## 2023-01-28 DIAGNOSIS — E119 Type 2 diabetes mellitus without complications: Secondary | ICD-10-CM | POA: Insufficient documentation

## 2023-01-28 DIAGNOSIS — Z79899 Other long term (current) drug therapy: Secondary | ICD-10-CM | POA: Diagnosis not present

## 2023-01-28 DIAGNOSIS — C01 Malignant neoplasm of base of tongue: Secondary | ICD-10-CM | POA: Diagnosis not present

## 2023-01-28 DIAGNOSIS — I10 Essential (primary) hypertension: Secondary | ICD-10-CM | POA: Insufficient documentation

## 2023-01-28 DIAGNOSIS — K149 Disease of tongue, unspecified: Secondary | ICD-10-CM | POA: Diagnosis present

## 2023-01-28 DIAGNOSIS — C7989 Secondary malignant neoplasm of other specified sites: Secondary | ICD-10-CM | POA: Insufficient documentation

## 2023-01-28 DIAGNOSIS — Z87891 Personal history of nicotine dependence: Secondary | ICD-10-CM | POA: Diagnosis not present

## 2023-01-28 DIAGNOSIS — J392 Other diseases of pharynx: Secondary | ICD-10-CM | POA: Diagnosis not present

## 2023-01-28 DIAGNOSIS — C12 Malignant neoplasm of pyriform sinus: Secondary | ICD-10-CM | POA: Diagnosis not present

## 2023-01-28 DIAGNOSIS — M1990 Unspecified osteoarthritis, unspecified site: Secondary | ICD-10-CM | POA: Insufficient documentation

## 2023-01-28 DIAGNOSIS — Z01818 Encounter for other preprocedural examination: Secondary | ICD-10-CM

## 2023-01-28 HISTORY — PX: MICROLARYNGOSCOPY: SHX5208

## 2023-01-28 HISTORY — DX: Presence of dental prosthetic device (complete) (partial): Z97.2

## 2023-01-28 LAB — GLUCOSE, CAPILLARY
Glucose-Capillary: 77 mg/dL (ref 70–99)
Glucose-Capillary: 95 mg/dL (ref 70–99)
Glucose-Capillary: 77 mg/dL (ref 70–99)
Glucose-Capillary: 88 mg/dL (ref 70–99)

## 2023-01-28 SURGERY — MICROLARYNGOSCOPY
Anesthesia: General | Site: Throat | Laterality: Right

## 2023-01-28 MED ORDER — SODIUM CHLORIDE 0.9 % IV SOLN: INTRAVENOUS | Status: DC | PRN

## 2023-01-28 MED ORDER — FENTANYL CITRATE (PF) 100 MCG/2ML IJ SOLN
INTRAMUSCULAR | Status: AC
  Filled 2023-01-28: qty 2

## 2023-01-28 MED ORDER — ACETAMINOPHEN 10 MG/ML IV SOLN: 1000.0000 mg | Freq: Once | INTRAVENOUS | Status: DC

## 2023-01-28 MED ORDER — LIDOCAINE HCL (CARDIAC) PF 100 MG/5ML IV SOSY
PREFILLED_SYRINGE | INTRAVENOUS | Status: DC | PRN
  Administered 2023-01-28: 60 mg via INTRAVENOUS

## 2023-01-28 MED ORDER — PROPOFOL 10 MG/ML IV BOLUS
INTRAVENOUS | Status: DC | PRN
  Administered 2023-01-28: 100 mg via INTRAVENOUS
  Administered 2023-01-28: 30 mg via INTRAVENOUS

## 2023-01-28 MED ORDER — FENTANYL CITRATE (PF) 100 MCG/2ML IJ SOLN
INTRAMUSCULAR | Status: DC | PRN
  Administered 2023-01-28: 50 ug via INTRAVENOUS

## 2023-01-28 MED ORDER — ONDANSETRON HCL 4 MG/2ML IJ SOLN
INTRAMUSCULAR | Status: DC | PRN
  Administered 2023-01-28: 4 mg via INTRAVENOUS

## 2023-01-28 MED ORDER — DEXTROSE 50 % IV SOLN
INTRAVENOUS | Status: AC
  Filled 2023-01-28: qty 50

## 2023-01-28 MED ORDER — DEXTROSE 50 % IV SOLN
15.0000 mL | Freq: Once | INTRAVENOUS | Status: AC
  Administered 2023-01-28: 15 mL via INTRAVENOUS

## 2023-01-28 MED ORDER — PHENYLEPHRINE HCL 0.5 % NA SOLN
NASAL | Status: DC | PRN
  Administered 2023-01-28: 15 mL via TOPICAL

## 2023-01-28 MED ORDER — GLYCOPYRROLATE 0.2 MG/ML IJ SOLN
INTRAMUSCULAR | Status: DC | PRN
  Administered 2023-01-28: .2 mg via INTRAVENOUS

## 2023-01-28 MED ORDER — PROPOFOL 1000 MG/100ML IV EMUL
INTRAVENOUS | Status: AC
Start: 2023-01-28 — End: ?
  Filled 2023-01-28: qty 100

## 2023-01-28 MED ORDER — DEXAMETHASONE SODIUM PHOSPHATE 4 MG/ML IJ SOLN
INTRAMUSCULAR | Status: DC | PRN
  Administered 2023-01-28: 8 mg via INTRAVENOUS

## 2023-01-28 MED ORDER — SUCCINYLCHOLINE CHLORIDE 200 MG/10ML IV SOSY
PREFILLED_SYRINGE | INTRAVENOUS | Status: DC | PRN
  Administered 2023-01-28: 100 mg via INTRAVENOUS

## 2023-01-28 SURGICAL SUPPLY — 21 items
ANTIFOG SOL W/FOAM PAD STRL (MISCELLANEOUS) ×1
BASIN GRAD PLASTIC 32OZ STRL (MISCELLANEOUS) ×1 IMPLANT
CANISTER SUCT 1200ML W/VALVE (MISCELLANEOUS) ×1 IMPLANT
COVER MAYO STAND STRL (DRAPES) ×1 IMPLANT
COVER TABLE BACK 60X90 (DRAPES) ×1 IMPLANT
CUP MEDICINE 2OZ PLAST GRAD ST (MISCELLANEOUS) ×1 IMPLANT
DRAPE SHEET LG 3/4 BI-LAMINATE (DRAPES) ×1 IMPLANT
DRSG TELFA 4X3 1S NADH ST (GAUZE/BANDAGES/DRESSINGS) ×1 IMPLANT
GLOVE BIO SURGEON STRL SZ7.5 (GLOVE) ×1 IMPLANT
KIT TURNOVER KIT A (KITS) ×1 IMPLANT
MARKER SKIN DUAL TIP RULER LAB (MISCELLANEOUS) ×1 IMPLANT
NDL FILTER BLUNT 18X1 1/2 (NEEDLE) ×1 IMPLANT
NEEDLE FILTER BLUNT 18X1 1/2 (NEEDLE) ×1 IMPLANT
NS IRRIG 500ML POUR BTL (IV SOLUTION) ×1 IMPLANT
PATTIES SURGICAL .5 X.5 (GAUZE/BANDAGES/DRESSINGS) ×1 IMPLANT
SOLUTION ANTFG W/FOAM PAD STRL (MISCELLANEOUS) ×1 IMPLANT
SPONGE XRAY 4X4 16PLY STRL (MISCELLANEOUS) ×1 IMPLANT
STRAP BODY AND KNEE 60X3 (MISCELLANEOUS) ×1 IMPLANT
SYR 10ML LL (SYRINGE) ×1 IMPLANT
TOWEL OR 17X26 4PK STRL BLUE (TOWEL DISPOSABLE) ×1 IMPLANT
TUBING SUCTION CONN 0.25 STRL (TUBING) ×1 IMPLANT

## 2023-01-28 NOTE — Op Note (Signed)
01/28/2023  9:21 AM    April [name]  [id number]   Pre-Op Dx: Oropharyngeal mass  Post-op Dx: SAME  Proc: MicroDirect laryngoscopy with biopsy right tongue base and right piriform sinus  Surg:  Davina Poke  Anes:  GOT  EBL: Less than 10 cc  Comp: None  Findings: Exophytic mass right tongue base extending on the lateral wall of the epiglottis into the piriform sinus  Procedure: [name] was identified in the holding her take the operating placed in supine position.  After general trach anesthesia the table was turned 90 degrees.  The patient was edentulous.  A 4 x 4 was placed against the maxillary ridge.  A Dedo laryngoscope was introduced into the airway and examination of the right tongue base showed a exophytic mass that extended from the right tongue base into the piriform sinus including the lateral wall of the epiglottis.  The laryngoscope was and suspended.  Photodocumentation was taken with a straight Hopkins rod.  The operating microscope was then brought into the field and multiple biopsies were taken of the right tongue base and right piriform sinus.  After biopsies were completed cottonoid pledget with phenylephrine lidocaine solution was used to bathe the area of the tumor.  With no significant bleeding the patient was then returned to anesthesia where she was awakened in the operating room and taken to cover room stable condition.  Specimens: Right tongue base and right piriform sinus  Dispo:   Good  Plan: Discharge to home will relay biopsy results as soon as they are provided.  Davina Poke  01/28/2023 9:21 AM

## 2023-01-28 NOTE — Anesthesia Postprocedure Evaluation (Signed)
Anesthesia Post Note  Patient: April [name]  Procedure(s) Performed: MICRODIRECT LARYNGOSCOPY WITH BIOPSY OF OROPHARYNGEAL MASS (Right: Throat)  Patient location during evaluation: PACU Anesthesia Type: General Level of consciousness: awake and alert Pain management: pain level controlled Vital Signs Assessment: post-procedure vital signs reviewed and stable Respiratory status: spontaneous breathing, nonlabored ventilation, respiratory function stable and patient connected to nasal cannula oxygen Cardiovascular status: blood pressure returned to baseline and stable Postop Assessment: no apparent nausea or vomiting Anesthetic complications: no   No notable events documented.   Last Vitals:  Vitals:   01/28/23 0950 01/28/23 0955  BP:    Pulse: 80 78  Resp: 19 (!) 24  Temp:    SpO2:      Last Pain:  Vitals:   01/28/23 0945  TempSrc:   PainSc: 0-No pain                 Daziyah Cogan C Petra Sargeant

## 2023-01-28 NOTE — Anesthesia Procedure Notes (Signed)
Procedure Name: Intubation Date/Time: 01/28/2023 9:05 AM  Performed by: Andee Poles, CRNAPre-anesthesia Checklist: Patient identified, Emergency Drugs available, Suction available, Patient being monitored and Timeout performed Patient Re-evaluated:Patient Re-evaluated prior to induction Oxygen Delivery Method: Circle system utilized Preoxygenation: Pre-oxygenation with 100% oxygen Induction Type: IV induction Ventilation: Mask ventilation without difficulty Laryngoscope Size: Mac and 3 Grade View: Grade I Tube type: Oral Tube size: 6.0 (mlt) mm Number of attempts: 1 Airway Equipment and Method: LTA kit utilized Placement Confirmation: ETT inserted through vocal cords under direct vision, positive ETCO2 and breath sounds checked- equal and bilateral Tube secured with: Tape Dental Injury: Teeth and Oropharynx as per pre-operative assessment

## 2023-01-28 NOTE — Transfer of Care (Signed)
Immediate Anesthesia Transfer of Care Note  Patient: April [name]  Procedure(s) Performed: MICRODIRECT LARYNGOSCOPY WITH BIOPSY OF OROPHARYNGEAL MASS (Right: Throat)  Patient Location: PACU  Anesthesia Type: General ETT  Level of Consciousness: awake, alert  and patient cooperative  Airway and Oxygen Therapy: Patient Spontanous Breathing and Patient connected to supplemental oxygen  Post-op Assessment: Post-op Vital signs reviewed, Patient's Cardiovascular Status Stable, Respiratory Function Stable, Patent Airway and No signs of Nausea or vomiting  Post-op Vital Signs: Reviewed and stable  Complications: No notable events documented.

## 2023-01-28 NOTE — H&P (Signed)
The patient's history has been reviewed, patient examined, no change in status, stable for surgery.  Questions were answered to the patients satisfaction.  

## 2023-01-29 ENCOUNTER — Encounter: Payer: Self-pay | Admitting: Unknown Physician Specialty

## 2023-01-31 ENCOUNTER — Inpatient Hospital Stay: Payer: Medicare HMO

## 2023-01-31 DIAGNOSIS — C218 Malignant neoplasm of overlapping sites of rectum, anus and anal canal: Secondary | ICD-10-CM | POA: Diagnosis not present

## 2023-01-31 DIAGNOSIS — Z510 Encounter for antineoplastic radiation therapy: Secondary | ICD-10-CM | POA: Diagnosis not present

## 2023-01-31 DIAGNOSIS — D649 Anemia, unspecified: Secondary | ICD-10-CM | POA: Diagnosis not present

## 2023-01-31 DIAGNOSIS — C20 Malignant neoplasm of rectum: Secondary | ICD-10-CM | POA: Diagnosis not present

## 2023-01-31 DIAGNOSIS — M109 Gout, unspecified: Secondary | ICD-10-CM | POA: Diagnosis not present

## 2023-01-31 DIAGNOSIS — R1310 Dysphagia, unspecified: Secondary | ICD-10-CM | POA: Diagnosis not present

## 2023-01-31 DIAGNOSIS — E041 Nontoxic single thyroid nodule: Secondary | ICD-10-CM | POA: Diagnosis not present

## 2023-01-31 DIAGNOSIS — E119 Type 2 diabetes mellitus without complications: Secondary | ICD-10-CM | POA: Diagnosis not present

## 2023-02-01 LAB — SURGICAL PATHOLOGY

## 2023-02-03 ENCOUNTER — Encounter: Payer: Self-pay | Admitting: Oncology

## 2023-02-03 ENCOUNTER — Other Ambulatory Visit: Payer: Self-pay

## 2023-02-03 ENCOUNTER — Ambulatory Visit
Admission: RE | Admit: 2023-02-03 | Discharge: 2023-02-03 | Disposition: A | Discharge status: Home / Self Care | Payer: Medicare HMO | Source: Ambulatory Visit | Attending: Radiation Oncology | Admitting: Radiation Oncology

## 2023-02-03 DIAGNOSIS — R1310 Dysphagia, unspecified: Secondary | ICD-10-CM | POA: Diagnosis not present

## 2023-02-03 DIAGNOSIS — E041 Nontoxic single thyroid nodule: Secondary | ICD-10-CM | POA: Diagnosis not present

## 2023-02-03 DIAGNOSIS — D649 Anemia, unspecified: Secondary | ICD-10-CM | POA: Diagnosis not present

## 2023-02-03 DIAGNOSIS — M109 Gout, unspecified: Secondary | ICD-10-CM | POA: Diagnosis not present

## 2023-02-03 DIAGNOSIS — E119 Type 2 diabetes mellitus without complications: Secondary | ICD-10-CM | POA: Diagnosis not present

## 2023-02-03 DIAGNOSIS — C218 Malignant neoplasm of overlapping sites of rectum, anus and anal canal: Secondary | ICD-10-CM | POA: Diagnosis not present

## 2023-02-03 DIAGNOSIS — Z510 Encounter for antineoplastic radiation therapy: Secondary | ICD-10-CM | POA: Diagnosis not present

## 2023-02-03 DIAGNOSIS — C20 Malignant neoplasm of rectum: Secondary | ICD-10-CM | POA: Diagnosis not present

## 2023-02-03 LAB — RAD ONC ARIA SESSION SUMMARY
Course Elapsed Days: 0
Plan Fractions Treated to Date: 1
Plan Prescribed Dose Per Fraction: 1.8 Gy
Plan Total Fractions Prescribed: 30
Plan Total Prescribed Dose: 54 Gy
Reference Point Dosage Given to Date: 1.8 Gy
Reference Point Session Dosage Given: 1.8 Gy
Session Number: 1

## 2023-02-03 NOTE — Progress Notes (Signed)
Clinical Intervention Note  Clinical Intervention Notes: Pt asked if there is an interaction between Colchicine and Xeloda. Per Up to Date, no DDI. No further action needed at this time. Closing Intervention.   Clinical Intervention Outcomes: Prevention of an adverse drug event   Bobette Mo Specialty Pharmacist

## 2023-02-04 ENCOUNTER — Other Ambulatory Visit: Payer: Self-pay

## 2023-02-04 ENCOUNTER — Encounter: Payer: Self-pay | Admitting: Oncology

## 2023-02-04 ENCOUNTER — Ambulatory Visit
Admission: RE | Admit: 2023-02-04 | Discharge: 2023-02-04 | Disposition: A | Discharge status: Home / Self Care | Payer: Medicare HMO | Source: Ambulatory Visit | Attending: Radiation Oncology | Admitting: Radiation Oncology

## 2023-02-04 DIAGNOSIS — D649 Anemia, unspecified: Secondary | ICD-10-CM | POA: Diagnosis not present

## 2023-02-04 DIAGNOSIS — C20 Malignant neoplasm of rectum: Secondary | ICD-10-CM | POA: Diagnosis not present

## 2023-02-04 DIAGNOSIS — E041 Nontoxic single thyroid nodule: Secondary | ICD-10-CM | POA: Diagnosis not present

## 2023-02-04 DIAGNOSIS — R1310 Dysphagia, unspecified: Secondary | ICD-10-CM | POA: Diagnosis not present

## 2023-02-04 DIAGNOSIS — E119 Type 2 diabetes mellitus without complications: Secondary | ICD-10-CM | POA: Diagnosis not present

## 2023-02-04 DIAGNOSIS — C218 Malignant neoplasm of overlapping sites of rectum, anus and anal canal: Secondary | ICD-10-CM | POA: Diagnosis not present

## 2023-02-04 DIAGNOSIS — M109 Gout, unspecified: Secondary | ICD-10-CM | POA: Diagnosis not present

## 2023-02-04 DIAGNOSIS — Z510 Encounter for antineoplastic radiation therapy: Secondary | ICD-10-CM | POA: Diagnosis not present

## 2023-02-04 LAB — RAD ONC ARIA SESSION SUMMARY
Course Elapsed Days: 1
Plan Fractions Treated to Date: 2
Plan Prescribed Dose Per Fraction: 1.8 Gy
Plan Total Fractions Prescribed: 30
Plan Total Prescribed Dose: 54 Gy
Reference Point Dosage Given to Date: 3.6 Gy
Reference Point Session Dosage Given: 1.8 Gy
Session Number: 2

## 2023-02-07 ENCOUNTER — Ambulatory Visit
Admission: RE | Admit: 2023-02-07 | Discharge: 2023-02-07 | Disposition: A | Discharge status: Home / Self Care | Payer: Medicare HMO | Source: Ambulatory Visit | Attending: Radiation Oncology | Admitting: Radiation Oncology

## 2023-02-07 ENCOUNTER — Other Ambulatory Visit: Payer: Self-pay | Admitting: *Deleted

## 2023-02-07 ENCOUNTER — Inpatient Hospital Stay: Payer: Medicare HMO

## 2023-02-07 ENCOUNTER — Encounter: Payer: Self-pay | Admitting: Oncology

## 2023-02-07 ENCOUNTER — Other Ambulatory Visit: Payer: Self-pay

## 2023-02-07 ENCOUNTER — Inpatient Hospital Stay (HOSPITAL_BASED_OUTPATIENT_CLINIC_OR_DEPARTMENT_OTHER): Payer: Medicare HMO | Admitting: Oncology

## 2023-02-07 VITALS — BP 125/51 | HR 67 | Temp 98.0°F | Resp 16 | Ht 65.98 in | Wt 144.5 lb

## 2023-02-07 DIAGNOSIS — C210 Malignant neoplasm of anus, unspecified: Secondary | ICD-10-CM

## 2023-02-07 DIAGNOSIS — C20 Malignant neoplasm of rectum: Secondary | ICD-10-CM | POA: Diagnosis not present

## 2023-02-07 DIAGNOSIS — D649 Anemia, unspecified: Secondary | ICD-10-CM | POA: Diagnosis not present

## 2023-02-07 DIAGNOSIS — M109 Gout, unspecified: Secondary | ICD-10-CM | POA: Diagnosis not present

## 2023-02-07 DIAGNOSIS — C218 Malignant neoplasm of overlapping sites of rectum, anus and anal canal: Secondary | ICD-10-CM | POA: Diagnosis not present

## 2023-02-07 DIAGNOSIS — E119 Type 2 diabetes mellitus without complications: Secondary | ICD-10-CM | POA: Diagnosis not present

## 2023-02-07 DIAGNOSIS — E041 Nontoxic single thyroid nodule: Secondary | ICD-10-CM | POA: Diagnosis not present

## 2023-02-07 DIAGNOSIS — Z510 Encounter for antineoplastic radiation therapy: Secondary | ICD-10-CM | POA: Diagnosis not present

## 2023-02-07 DIAGNOSIS — R1310 Dysphagia, unspecified: Secondary | ICD-10-CM | POA: Diagnosis not present

## 2023-02-07 LAB — RAD ONC ARIA SESSION SUMMARY
Course Elapsed Days: 4
Plan Fractions Treated to Date: 3
Plan Prescribed Dose Per Fraction: 1.8 Gy
Plan Total Fractions Prescribed: 30
Plan Total Prescribed Dose: 54 Gy
Reference Point Dosage Given to Date: 5.4 Gy
Reference Point Session Dosage Given: 1.8 Gy
Session Number: 3

## 2023-02-07 LAB — CMP (CANCER CENTER ONLY)
ALT: 6 U/L (ref 0–44)
AST: 14 U/L — ABNORMAL LOW (ref 15–41)
Albumin: 3.4 g/dL — ABNORMAL LOW (ref 3.5–5.0)
Alkaline Phosphatase: 39 U/L (ref 38–126)
Anion gap: 8 (ref 5–15)
BUN: 15 mg/dL (ref 8–23)
CO2: 23 mmol/L (ref 22–32)
Calcium: 8.6 mg/dL — ABNORMAL LOW (ref 8.9–10.3)
Chloride: 106 mmol/L (ref 98–111)
Creatinine: 1.14 mg/dL — ABNORMAL HIGH (ref 0.44–1.00)
GFR, Estimated: 48 mL/min — ABNORMAL LOW (ref >60)
Glucose, Bld: 151 mg/dL — ABNORMAL HIGH (ref 70–99)
Potassium: 3.7 mmol/L (ref 3.5–5.1)
Sodium: 137 mmol/L (ref 135–145)
Total Bilirubin: 0.5 mg/dL (ref <1.2)
Total Protein: 6.8 g/dL (ref 6.5–8.1)

## 2023-02-07 LAB — CBC WITH DIFFERENTIAL (CANCER CENTER ONLY)
Abs Immature Granulocytes: 0.02 10*3/uL (ref 0.00–0.07)
Basophils Absolute: 0 10*3/uL (ref 0.0–0.1)
Basophils Relative: 0 %
Eosinophils Absolute: 0.1 10*3/uL (ref 0.0–0.5)
Eosinophils Relative: 2 %
HCT: 25.3 % — ABNORMAL LOW (ref 36.0–46.0)
Hemoglobin: 7.8 g/dL — ABNORMAL LOW (ref 12.0–15.0)
Immature Granulocytes: 0 %
Lymphocytes Relative: 27 %
Lymphs Abs: 1.4 10*3/uL (ref 0.7–4.0)
MCH: 27.8 pg (ref 26.0–34.0)
MCHC: 30.8 g/dL (ref 30.0–36.0)
MCV: 90 fL (ref 80.0–100.0)
Monocytes Absolute: 0.4 10*3/uL (ref 0.1–1.0)
Monocytes Relative: 7 %
Neutro Abs: 3.3 10*3/uL (ref 1.7–7.7)
Neutrophils Relative %: 64 %
Platelet Count: 233 10*3/uL (ref 150–400)
RBC: 2.81 MIL/uL — ABNORMAL LOW (ref 3.87–5.11)
RDW: 13.4 % (ref 11.5–15.5)
WBC Count: 5.2 10*3/uL (ref 4.0–10.5)
nRBC: 0 % (ref 0.0–0.2)

## 2023-02-07 LAB — PREPARE RBC (CROSSMATCH)

## 2023-02-07 LAB — ABO/RH: ABO/RH(D): A POS

## 2023-02-07 LAB — MAGNESIUM: Magnesium: 1.3 mg/dL — ABNORMAL LOW (ref 1.7–2.4)

## 2023-02-07 MED ORDER — MITOMYCIN CHEMO IV INJECTION 20 MG
10.0000 mg/m2 | Freq: Once | INTRAVENOUS | Status: AC
  Administered 2023-02-07: 17 mg via INTRAVENOUS
  Filled 2023-02-07: qty 34

## 2023-02-07 MED ORDER — SODIUM CHLORIDE 0.9 % IV SOLN
INTRAVENOUS | Status: DC
  Filled 2023-02-07: qty 250

## 2023-02-07 MED ORDER — PROCHLORPERAZINE MALEATE 10 MG PO TABS
10.0000 mg | ORAL_TABLET | Freq: Once | ORAL | Status: AC
  Administered 2023-02-07: 10 mg via ORAL
  Filled 2023-02-07: qty 1

## 2023-02-07 MED ORDER — MAGNESIUM SULFATE 4 GM/100ML IV SOLN
4.0000 g | Freq: Once | INTRAVENOUS | Status: AC
  Administered 2023-02-07: 4 g via INTRAVENOUS
  Filled 2023-02-07: qty 100

## 2023-02-07 NOTE — Progress Notes (Signed)
Blood return prior to, during, and after treatment today.

## 2023-02-07 NOTE — Patient Instructions (Signed)
[facility] - A DEPT OF [facility]  Discharge Instructions: Thank you for choosing [facility] to provide your oncology and hematology care.  If you have a lab appointment with the Cancer Center, please go directly to the Cancer Center and check in at the registration area.  Wear comfortable clothing and clothing appropriate for easy access to any Portacath or PICC line.   We strive to give you quality time with your provider. You may need to reschedule your appointment if you arrive late (15 or more minutes).  Arriving late affects you and other patients whose appointments are after yours.  Also, if you miss three or more appointments without notifying the office, you may be dismissed from the clinic at the provider's discretion.      For prescription refill requests, have your pharmacy contact our office and allow 72 hours for refills to be completed.    Today you received the following chemotherapy and/or immunotherapy agents Mitomycin      To help prevent nausea and vomiting after your treatment, we encourage you to take your nausea medication as directed.  BELOW ARE SYMPTOMS THAT SHOULD BE REPORTED IMMEDIATELY: *FEVER GREATER THAN 100.4 F (38 C) OR HIGHER *CHILLS OR SWEATING *NAUSEA AND VOMITING THAT IS NOT CONTROLLED WITH YOUR NAUSEA MEDICATION *UNUSUAL SHORTNESS OF BREATH *UNUSUAL BRUISING OR BLEEDING *URINARY PROBLEMS (pain or burning when urinating, or frequent urination) *BOWEL PROBLEMS (unusual diarrhea, constipation, pain near the anus) TENDERNESS IN MOUTH AND THROAT WITH OR WITHOUT PRESENCE OF ULCERS (sore throat, sores in mouth, or a toothache) UNUSUAL RASH, SWELLING OR PAIN  UNUSUAL VAGINAL DISCHARGE OR ITCHING   Items with * indicate a potential emergency and should be followed up as soon as possible or go to the Emergency Department if any problems should occur.  Please show the CHEMOTHERAPY ALERT CARD or IMMUNOTHERAPY  ALERT CARD at check-in to the Emergency Department and triage nurse.  Should you have questions after your visit or need to cancel or reschedule your appointment, please contact [facility] - A DEPT OF [facility]  [phone] and follow the prompts.  Office hours are 8:00 a.m. to 4:30 p.m. Monday - Friday. Please note that voicemails left after 4:00 p.m. may not be returned until the following business day.  We are closed weekends and major holidays. You have access to a nurse at all times for urgent questions. Please call the main number to the clinic [phone] and follow the prompts.  For any non-urgent questions, you may also contact your provider using MyChart. We now offer e-Visits for anyone 66 and older to request care online for non-urgent symptoms. For details visit [email or web address].   Also download the MyChart app! Go to the app store, search "MyChart", open the app, select [place], and log in with your MyChart username and password.

## 2023-02-07 NOTE — Progress Notes (Signed)
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  INTERVAL HISTORY: Patient returns to clinic today for further evaluation and initiation of IV mitomycin.  She started concurrent XRT with Xeloda last week and is tolerating her treatments well without significant side effects.  She currently feels well and is asymptomatic.  She denies any further bleeding.  She does not complain of pain today.  She has no neurologic complaints.  She denies any recent fevers or illnesses.  She has a good appetite and denies weight loss.  She has no chest pain, shortness of breath, cough, or hemoptysis.  She denies any nausea, vomiting, constipation, or diarrhea.  She has no melena or hematochezia.  She has no urinary complaints.  Patient offers no further specific complaints today.  REVIEW OF SYSTEMS:   Review of Systems  Constitutional: Negative.  Negative for fever, malaise/fatigue and weight loss.  HENT:  Negative for sore throat.   Respiratory: Negative.  Negative for cough, hemoptysis and shortness of breath.   Cardiovascular: Negative.  Negative for chest pain and leg swelling.  Gastrointestinal: Negative.  Negative for abdominal pain, blood in stool and melena.  Genitourinary: Negative.  Negative for dysuria.  Musculoskeletal: Negative.  Negative for back pain.  Skin: Negative.  Negative for rash.  Neurological: Negative.  Negative for dizziness, focal weakness, weakness and  headaches.  Psychiatric/Behavioral: Negative.  The patient is not nervous/anxious.    As per HPI. Otherwise, a complete review of systems is negative.  PAST MEDICAL HISTORY: Past Medical History:  Diagnosis Date   Diabetes mellitus without complication (HCC)    Gout    Hyperlipidemia    Hypertension    Wears dentures    full upper and lower    PAST SURGICAL HISTORY: Past Surgical History:  Procedure Laterality Date   APPENDECTOMY     gallstones  06/12/2009   MICROLARYNGOSCOPY Right 01/28/2023   Procedure: MICRODIRECT LARYNGOSCOPY WITH BIOPSY OF OROPHARYNGEAL MASS;  Surgeon: Linus Salmons, MD;  Location[facility];  Service: ENT;  Laterality: Right;  Diabetic    FAMILY HISTORY: Family History  Problem Relation Age of Onset   Breast cancer Neg Hx     ADVANCED DIRECTIVES (Y/N):  N  HEALTH MAINTENANCE: Social History   Tobacco Use   Smoking status: Former    Current packs/day: 0.00    Average packs/day: 0.5 packs/day for 8.0 years (4.0 ttl pk-yrs)    Types: Cigarettes    Start date: 98    Quit date: 73    Years since quitting: 35.0    Passive exposure: Past   Smokeless tobacco: Never  Vaping Use   Vaping status: Never Used  Substance Use Topics   Alcohol use: No    Alcohol/week: 0.0 standard drinks of alcohol   Drug use: No     Colonoscopy:  PAP:  Bone density:  Lipid panel:  No Known Allergies  Current Outpatient Medications  Medication Sig Dispense Refill   Accu-Chek Softclix Lancets lancets      amLODipine (NORVASC) 10  MG tablet Take 10 mg by mouth daily.     aspirin 81 MG tablet Take 1 tablet by mouth daily.     capecitabine (XELODA) 500 MG tablet Take 2 tablets (1,000 mg total) by mouth 2 (two) times daily after a meal. Take Monday-Friday. Take only on days of radiation. 120 tablet 0   carvedilol (COREG) 3.125 MG tablet Take 1 tablet by mouth 2 (two) times daily. cardio     colchicine 0.6 MG tablet TAKE 1 TABLET(0.6 MG) BY MOUTH  DAILY 10 tablet 1   diclofenac Sodium (VOLTAREN) 1 % GEL Research Patient: Apply 0.5 g (1 fingertip) to each hand and each foot twice daily for the duration of radiation therapy 200 g 0   glucose blood (ACCU-CHEK AVIVA PLUS) test strip USE TWICE DAILY AS DIRECTED     lisinopril-hydrochlorothiazide (ZESTORETIC) 20-25 MG tablet Take 1 tablet by mouth daily.     metFORMIN (GLUCOPHAGE) 500 MG tablet TAKE 1 TABLET TWICE DAILY (Patient taking differently: 500 mg. TAKE 1 TABLETin morning and 2 at night/ Dr Gershon Crane) 180 tablet 1   prochlorperazine (COMPAZINE) 10 MG tablet Take 1 tablet (10 mg total) by mouth every 6 (six) hours as needed for nausea or vomiting. 30 tablet 1   simvastatin (ZOCOR) 20 MG tablet Take 1 tablet (20 mg total) by mouth daily. 90 tablet 1   tiZANidine (ZANAFLEX) 4 MG tablet Take 1 tablet (4 mg total) by mouth at bedtime. 90 tablet 1   Current Facility-Administered Medications  Medication Dose Route Frequency Provider Last Rate Last Admin   albuterol (PROVENTIL) (2.5 MG/3ML) 0.083% nebulizer solution 2.5 mg  2.5 mg Nebulization Once [name] C, MD        OBJECTIVE: Vitals:   02/07/23 0841  BP: (!) 125/51  Pulse: 67  Resp: 16  Temp: 98 F (36.7 C)  SpO2: 100%     Body mass index is 23.34 kg/m.    ECOG FS:0 - Asymptomatic  General: Well-developed, well-nourished, no acute distress.  Sitting in a wheelchair. Eyes: Pink conjunctiva, anicteric sclera. HEENT: Normocephalic, moist mucous membranes. Lungs: No audible wheezing or coughing. Heart: Regular rate and rhythm. Abdomen: Soft, nontender, no obvious distention. Musculoskeletal: No edema, cyanosis, or clubbing. Neuro: Alert, answering all questions appropriately. Cranial nerves grossly intact. Skin: No rashes or petechiae noted. Psych: Normal affect.    LAB RESULTS:  Lab Results  Component Value Date   NA 137 02/07/2023   K 3.7 02/07/2023   CL 106 02/07/2023   CO2 23 02/07/2023   GLUCOSE 151 (H)  02/07/2023   BUN 15 02/07/2023   CREATININE 1.14 (H) 02/07/2023   CALCIUM 8.6 (L) 02/07/2023   PROT 6.8 02/07/2023   ALBUMIN 3.4 (L) 02/07/2023   AST 14 (L) 02/07/2023   ALT 6 02/07/2023   ALKPHOS 39 02/07/2023   BILITOT 0.5 02/07/2023   GFRNONAA 48 (L) 02/07/2023   GFRAA 59 (L) 09/24/2019    Lab Results  Component Value Date   WBC 5.2 02/07/2023   NEUTROABS 3.3 02/07/2023   HGB 7.8 (L) 02/07/2023   HCT 25.3 (L) 02/07/2023   MCV 90.0 02/07/2023   PLT 233 02/07/2023     STUDIES: MM 3D SCREENING MAMMOGRAM BILATERAL BREAST Result Date: 01/21/2023 CLINICAL DATA:  Screening. EXAM: DIGITAL SCREENING BILATERAL MAMMOGRAM WITH TOMOSYNTHESIS AND CAD TECHNIQUE: Bilateral screening digital craniocaudal and mediolateral oblique mammograms were obtained. Bilateral screening digital breast tomosynthesis was performed. The images were evaluated with computer-aided detection. COMPARISON:  Previous exam(s). ACR Breast  Density Category b: There are scattered areas of fibroglandular density. FINDINGS: There are no findings suspicious for malignancy. IMPRESSION: No mammographic evidence of malignancy. A result letter of this screening mammogram will be mailed directly to the patient. RECOMMENDATION: Screening mammogram in one year. (Code:SM-B-01Y) BI-RADS CATEGORY  1: Negative. Electronically Signed   By: Frederico Hamman M.D.   On: 01/21/2023 15:49   NM PET Image Initial (PI) Skull Base To Thigh (F-18 FDG) Result Date: 01/17/2023 CLINICAL DATA:  Initial treatment strategy for rectal cancer. EXAM: NUCLEAR MEDICINE PET SKULL BASE TO THIGH TECHNIQUE: 7.3 mCi F-18 FDG was injected intravenously. Full-ring PET imaging was performed from the skull base to thigh after the radiotracer. CT data was obtained and used for attenuation correction and anatomic localization. Fasting blood glucose: 82 mg/dl COMPARISON:  None Available. FINDINGS: Mediastinal blood pool activity: SUV max 1.8 Liver activity: SUV max NA  NECK: Right oropharyngeal mass measures approximately 1.6 x 2.3 cm (6/20), SUV max 20.7. Adjacent right level II lymph nodes with index lymph node measuring 7 mm (6/26), SUV max 3.4. Incidental CT findings: None. CHEST: Heterogeneous, enlarged and nodular thyroid with areas of hypermetabolism. Index lesion on the left, SUV max 9.5. No additional abnormal hypermetabolism. Incidental CT findings: Atherosclerotic calcification of the aorta, aortic valve and coronary arteries. Heart is enlarged. No pericardial or pleural effusion. ABDOMEN/PELVIS: Anorectal hypermetabolism, SUV max 11.4, with soft tissue fullness on CT. A discrete mass is difficult to measure without IV or enteric contrast. No perirectal hypermetabolic lymph nodes. No additional abnormal hypermetabolism. Incidental CT findings: Cholecystectomy. Low-attenuation lesions in the kidneys. No specific follow-up necessary. SKELETON: No abnormal hypermetabolism. Incidental CT findings: Degenerative changes in the spine. IMPRESSION: 1. 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 No evidence of distant metastatic disease. 2. Hypermetabolic right oropharyngeal mass with hypermetabolic right level II cervical lymph nodes, compatible with primary head/neck cancer. 3. Heterogeneous, nodular and enlarged thyroid with areas of hypermetabolism. Recommend thyroid US and biopsy. (Ref: J Am Coll Radiol. 2015 Feb;12(2): 143-50). 4. Aortic atherosclerosis (ICD10-I70.0). Coronary artery calcification. Electronically Signed   By: Leanna Battles M.D.   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  Agree with radiation oncology assessment that lesion is likely greater than 5 cm increasing stage IIIa.  Patient will be benefit from daily XRT along with concurrent chemotherapy using IV mitomycin on days 1 and 29 along with oral Xeloda on the days of her radiation.  Patient initiated XRT and Xeloda last week.  Proceed with day 1 of mitomycin today.  Return to clinic tomorrow for 1 unit of blood and then in 1 week for laboratory work and further evaluation Head and neck cancer: PET scan results as above.  Biopsy confirmed squamous cell carcinoma.  Will hold treatment until completion of treatment for anal cancer.   Thyroid nodule with hypermetabolism: Defer ultrasound and biopsy until completion of treatment of 2 malignancies above.. Anemia: Hemoglobin is 7.8 today.  Return to clinic tomorrow for 1 unit of packed red blood cells. Hypomagnesia: Patient received 4 g IV magnesium today. Renal: Mild, monitor.  Patient expressed understanding and was in agreement with  this plan. She also understands that She can call clinic at any time with any questions, concerns, or complaints.    Cancer Staging  Anal squamous cell carcinoma (HCC) Staging form: Anus, AJCC V9 - Clinical stage from 01/18/2023: Stage IIIA (cT3, cN0, cM0) - Signed by Jeralyn Ruths, MD on 01/18/2023 Stage prefix: Initial diagnosis   Jeralyn Ruths, MD   02/07/2023 9:19 AM

## 2023-02-08 ENCOUNTER — Telehealth: Payer: Self-pay

## 2023-02-08 ENCOUNTER — Other Ambulatory Visit: Payer: Self-pay

## 2023-02-08 ENCOUNTER — Inpatient Hospital Stay: Payer: Medicare HMO

## 2023-02-08 ENCOUNTER — Ambulatory Visit
Admission: RE | Admit: 2023-02-08 | Discharge: 2023-02-08 | Disposition: A | Discharge status: Home / Self Care | Payer: Medicare HMO | Source: Ambulatory Visit | Attending: Radiation Oncology | Admitting: Radiation Oncology

## 2023-02-08 DIAGNOSIS — C210 Malignant neoplasm of anus, unspecified: Secondary | ICD-10-CM

## 2023-02-08 DIAGNOSIS — C218 Malignant neoplasm of overlapping sites of rectum, anus and anal canal: Secondary | ICD-10-CM | POA: Diagnosis not present

## 2023-02-08 DIAGNOSIS — E119 Type 2 diabetes mellitus without complications: Secondary | ICD-10-CM | POA: Diagnosis not present

## 2023-02-08 DIAGNOSIS — C20 Malignant neoplasm of rectum: Secondary | ICD-10-CM | POA: Diagnosis not present

## 2023-02-08 DIAGNOSIS — E041 Nontoxic single thyroid nodule: Secondary | ICD-10-CM | POA: Diagnosis not present

## 2023-02-08 DIAGNOSIS — Z510 Encounter for antineoplastic radiation therapy: Secondary | ICD-10-CM | POA: Diagnosis not present

## 2023-02-08 DIAGNOSIS — D649 Anemia, unspecified: Secondary | ICD-10-CM | POA: Diagnosis not present

## 2023-02-08 DIAGNOSIS — M109 Gout, unspecified: Secondary | ICD-10-CM | POA: Diagnosis not present

## 2023-02-08 DIAGNOSIS — R1310 Dysphagia, unspecified: Secondary | ICD-10-CM | POA: Diagnosis not present

## 2023-02-08 LAB — RAD ONC ARIA SESSION SUMMARY
Course Elapsed Days: 5
Plan Fractions Treated to Date: 4
Plan Prescribed Dose Per Fraction: 1.8 Gy
Plan Total Fractions Prescribed: 30
Plan Total Prescribed Dose: 54 Gy
Reference Point Dosage Given to Date: 7.2 Gy
Reference Point Session Dosage Given: 1.8 Gy
Session Number: 4

## 2023-02-08 MED ORDER — ACETAMINOPHEN 325 MG PO TABS
650.0000 mg | ORAL_TABLET | Freq: Once | ORAL | Status: AC
  Administered 2023-02-08: 650 mg via ORAL
  Filled 2023-02-08: qty 2

## 2023-02-08 MED ORDER — SODIUM CHLORIDE 0.9% IV SOLUTION
250.0000 mL | INTRAVENOUS | Status: DC
  Administered 2023-02-08: 100 mL via INTRAVENOUS
  Filled 2023-02-08: qty 250

## 2023-02-08 MED ORDER — DIPHENHYDRAMINE HCL 50 MG/ML IJ SOLN
25.0000 mg | Freq: Once | INTRAMUSCULAR | Status: AC
  Administered 2023-02-08: 25 mg via INTRAVENOUS
  Filled 2023-02-08: qty 1

## 2023-02-08 NOTE — Telephone Encounter (Signed)
Telephone call to patient for follow up after receiving first infusion.   No answer but left message stating we were calling to check on them.  Encouraged patient to call for any questions or concerns.   

## 2023-02-09 LAB — TYPE AND SCREEN
ABO/RH(D): A POS
Antibody Screen: NEGATIVE
Unit division: 0
Unit division: 0

## 2023-02-09 LAB — BPAM RBC
Blood Product Expiration Date: 202501252359
Blood Product Unit Number: 202412312359
ISSUE DATE / TIME: 202412301436
PRODUCT CODE: 202412311142
PRODUCT CODE: 202501252359
Unit Type and Rh: 6200
Unit Type and Rh: 202412312359
Unit Type and Rh: 6200
Unit Type and Rh: 9500

## 2023-02-10 ENCOUNTER — Encounter: Payer: Self-pay | Admitting: Oncology

## 2023-02-10 ENCOUNTER — Other Ambulatory Visit: Payer: Self-pay

## 2023-02-10 ENCOUNTER — Ambulatory Visit
Admission: RE | Admit: 2023-02-10 | Discharge: 2023-02-10 | Disposition: A | Discharge status: Home / Self Care | Payer: Medicare HMO | Source: Ambulatory Visit | Attending: Radiation Oncology | Admitting: Radiation Oncology

## 2023-02-10 ENCOUNTER — Telehealth: Payer: Self-pay | Admitting: *Deleted

## 2023-02-10 ENCOUNTER — Inpatient Hospital Stay: Payer: Medicare HMO

## 2023-02-10 DIAGNOSIS — Z87891 Personal history of nicotine dependence: Secondary | ICD-10-CM | POA: Insufficient documentation

## 2023-02-10 DIAGNOSIS — Z79899 Other long term (current) drug therapy: Secondary | ICD-10-CM | POA: Insufficient documentation

## 2023-02-10 DIAGNOSIS — N289 Disorder of kidney and ureter, unspecified: Secondary | ICD-10-CM | POA: Insufficient documentation

## 2023-02-10 DIAGNOSIS — Z7984 Long term (current) use of oral hypoglycemic drugs: Secondary | ICD-10-CM | POA: Insufficient documentation

## 2023-02-10 DIAGNOSIS — Z510 Encounter for antineoplastic radiation therapy: Secondary | ICD-10-CM | POA: Insufficient documentation

## 2023-02-10 DIAGNOSIS — H9209 Otalgia, unspecified ear: Secondary | ICD-10-CM

## 2023-02-10 DIAGNOSIS — E041 Nontoxic single thyroid nodule: Secondary | ICD-10-CM | POA: Insufficient documentation

## 2023-02-10 DIAGNOSIS — R1310 Dysphagia, unspecified: Secondary | ICD-10-CM | POA: Insufficient documentation

## 2023-02-10 DIAGNOSIS — D72819 Decreased white blood cell count, unspecified: Secondary | ICD-10-CM | POA: Insufficient documentation

## 2023-02-10 DIAGNOSIS — E119 Type 2 diabetes mellitus without complications: Secondary | ICD-10-CM | POA: Insufficient documentation

## 2023-02-10 DIAGNOSIS — Z791 Long term (current) use of non-steroidal anti-inflammatories (NSAID): Secondary | ICD-10-CM | POA: Insufficient documentation

## 2023-02-10 DIAGNOSIS — C20 Malignant neoplasm of rectum: Secondary | ICD-10-CM | POA: Diagnosis not present

## 2023-02-10 DIAGNOSIS — E042 Nontoxic multinodular goiter: Secondary | ICD-10-CM | POA: Insufficient documentation

## 2023-02-10 DIAGNOSIS — R197 Diarrhea, unspecified: Secondary | ICD-10-CM | POA: Insufficient documentation

## 2023-02-10 DIAGNOSIS — Z923 Personal history of irradiation: Secondary | ICD-10-CM | POA: Insufficient documentation

## 2023-02-10 DIAGNOSIS — C210 Malignant neoplasm of anus, unspecified: Secondary | ICD-10-CM | POA: Insufficient documentation

## 2023-02-10 DIAGNOSIS — I119 Hypertensive heart disease without heart failure: Secondary | ICD-10-CM | POA: Insufficient documentation

## 2023-02-10 DIAGNOSIS — Z5111 Encounter for antineoplastic chemotherapy: Secondary | ICD-10-CM | POA: Insufficient documentation

## 2023-02-10 DIAGNOSIS — C218 Malignant neoplasm of overlapping sites of rectum, anus and anal canal: Secondary | ICD-10-CM | POA: Diagnosis not present

## 2023-02-10 DIAGNOSIS — E785 Hyperlipidemia, unspecified: Secondary | ICD-10-CM | POA: Insufficient documentation

## 2023-02-10 DIAGNOSIS — I700 Atherosclerosis of aorta: Secondary | ICD-10-CM | POA: Insufficient documentation

## 2023-02-10 DIAGNOSIS — D649 Anemia, unspecified: Secondary | ICD-10-CM | POA: Insufficient documentation

## 2023-02-10 DIAGNOSIS — R070 Pain in throat: Secondary | ICD-10-CM

## 2023-02-10 DIAGNOSIS — M109 Gout, unspecified: Secondary | ICD-10-CM | POA: Insufficient documentation

## 2023-02-10 DIAGNOSIS — Z7982 Long term (current) use of aspirin: Secondary | ICD-10-CM | POA: Insufficient documentation

## 2023-02-10 LAB — RAD ONC ARIA SESSION SUMMARY
Course Elapsed Days: 7
Plan Fractions Treated to Date: 5
Plan Prescribed Dose Per Fraction: 1.8 Gy
Plan Total Fractions Prescribed: 30
Plan Total Prescribed Dose: 54 Gy
Reference Point Dosage Given to Date: 9 Gy
Reference Point Session Dosage Given: 1.8 Gy
Session Number: 5

## 2023-02-10 NOTE — Telephone Encounter (Signed)
 The pt . States that she has ear pain going down to the throat , she gets chemo and radiation. I spoke to Dr. [name] and he feels you need to see [facility] . She has an appt in am 10 am and I told her that she will get radiation and then come and get labs, and then she will see Josh to see what we can do for her. She is ok with this

## 2023-02-10 NOTE — Progress Notes (Signed)
 [facility] CSW Progress Note  Clinical Child Psychotherapist contacted patient by phone per new patient protocol to assess psychosocial needs.  Patient stated she could hardly swallow and was only able to eat soft foods.  Her pain has also increased.  She requested CSW send a message to Dr. Jerone RN..  CSW to follow up.    Macario CHRISTELLA Au, LCSW Clinical Social Worker [facility]

## 2023-02-11 ENCOUNTER — Inpatient Hospital Stay: Payer: Medicare HMO

## 2023-02-11 ENCOUNTER — Encounter: Payer: Self-pay | Admitting: Oncology

## 2023-02-11 ENCOUNTER — Ambulatory Visit
Admission: RE | Admit: 2023-02-11 | Discharge: 2023-02-11 | Disposition: A | Discharge status: Home / Self Care | Payer: Medicare HMO | Source: Ambulatory Visit | Attending: Radiation Oncology | Admitting: Radiation Oncology

## 2023-02-11 ENCOUNTER — Other Ambulatory Visit: Payer: Self-pay | Admitting: *Deleted

## 2023-02-11 ENCOUNTER — Other Ambulatory Visit: Payer: Self-pay

## 2023-02-11 ENCOUNTER — Encounter: Payer: Self-pay | Admitting: Hospice and Palliative Medicine

## 2023-02-11 ENCOUNTER — Inpatient Hospital Stay (HOSPITAL_BASED_OUTPATIENT_CLINIC_OR_DEPARTMENT_OTHER): Payer: Medicare HMO | Admitting: Hospice and Palliative Medicine

## 2023-02-11 ENCOUNTER — Other Ambulatory Visit: Payer: Self-pay | Admitting: Surgery

## 2023-02-11 VITALS — BP 130/50 | HR 69 | Temp 97.6°F | Resp 16 | Ht 65.98 in | Wt 139.8 lb

## 2023-02-11 DIAGNOSIS — C210 Malignant neoplasm of anus, unspecified: Secondary | ICD-10-CM

## 2023-02-11 DIAGNOSIS — C218 Malignant neoplasm of overlapping sites of rectum, anus and anal canal: Secondary | ICD-10-CM | POA: Diagnosis not present

## 2023-02-11 DIAGNOSIS — R1310 Dysphagia, unspecified: Secondary | ICD-10-CM | POA: Diagnosis not present

## 2023-02-11 DIAGNOSIS — C20 Malignant neoplasm of rectum: Secondary | ICD-10-CM | POA: Diagnosis not present

## 2023-02-11 DIAGNOSIS — E041 Nontoxic single thyroid nodule: Secondary | ICD-10-CM | POA: Diagnosis not present

## 2023-02-11 DIAGNOSIS — Z5111 Encounter for antineoplastic chemotherapy: Secondary | ICD-10-CM | POA: Diagnosis not present

## 2023-02-11 DIAGNOSIS — G893 Neoplasm related pain (acute) (chronic): Secondary | ICD-10-CM | POA: Diagnosis not present

## 2023-02-11 DIAGNOSIS — R070 Pain in throat: Secondary | ICD-10-CM

## 2023-02-11 DIAGNOSIS — H9209 Otalgia, unspecified ear: Secondary | ICD-10-CM

## 2023-02-11 DIAGNOSIS — Z510 Encounter for antineoplastic radiation therapy: Secondary | ICD-10-CM | POA: Diagnosis not present

## 2023-02-11 DIAGNOSIS — E119 Type 2 diabetes mellitus without complications: Secondary | ICD-10-CM | POA: Diagnosis not present

## 2023-02-11 DIAGNOSIS — D649 Anemia, unspecified: Secondary | ICD-10-CM | POA: Diagnosis not present

## 2023-02-11 LAB — CBC WITH DIFFERENTIAL/PLATELET
Abs Immature Granulocytes: 0.03 10*3/uL (ref 0.00–0.07)
Basophils Absolute: 0 10*3/uL (ref 0.0–0.1)
Basophils Relative: 0 %
Eosinophils Absolute: 0.1 10*3/uL (ref 0.0–0.5)
Eosinophils Relative: 2 %
HCT: 30.5 % — ABNORMAL LOW (ref 36.0–46.0)
Hemoglobin: 9.3 g/dL — ABNORMAL LOW (ref 12.0–15.0)
Immature Granulocytes: 1 %
Lymphocytes Relative: 19 %
Lymphs Abs: 1 10*3/uL (ref 0.7–4.0)
MCH: 26.9 pg (ref 26.0–34.0)
MCHC: 30.5 g/dL (ref 30.0–36.0)
MCV: 88.2 fL (ref 80.0–100.0)
Monocytes Absolute: 0.2 10*3/uL (ref 0.1–1.0)
Monocytes Relative: 4 %
Neutro Abs: 4 10*3/uL (ref 1.7–7.7)
Neutrophils Relative %: 74 %
Platelets: 239 10*3/uL (ref 150–400)
RBC: 3.46 MIL/uL — ABNORMAL LOW (ref 3.87–5.11)
RDW: 14.9 % (ref 11.5–15.5)
WBC: 5.3 10*3/uL (ref 4.0–10.5)
nRBC: 0 % (ref 0.0–0.2)

## 2023-02-11 LAB — RAD ONC ARIA SESSION SUMMARY
Course Elapsed Days: 8
Plan Fractions Treated to Date: 6
Plan Prescribed Dose Per Fraction: 1.8 Gy
Plan Total Fractions Prescribed: 30
Plan Total Prescribed Dose: 54 Gy
Reference Point Dosage Given to Date: 10.8 Gy
Reference Point Session Dosage Given: 1.8 Gy
Session Number: 6

## 2023-02-11 LAB — COMPREHENSIVE METABOLIC PANEL
ALT: 7 U/L (ref 0–44)
AST: 15 U/L (ref 15–41)
Albumin: 3.8 g/dL (ref 3.5–5.0)
Alkaline Phosphatase: 44 U/L (ref 38–126)
Anion gap: 8 (ref 5–15)
BUN: 18 mg/dL (ref 8–23)
CO2: 23 mmol/L (ref 22–32)
Calcium: 9.1 mg/dL (ref 8.9–10.3)
Chloride: 106 mmol/L (ref 98–111)
Creatinine, Ser: 1.1 mg/dL — ABNORMAL HIGH (ref 0.44–1.00)
GFR, Estimated: 50 mL/min — ABNORMAL LOW (ref >60)
Glucose, Bld: 98 mg/dL (ref 70–99)
Potassium: 4.4 mmol/L (ref 3.5–5.1)
Sodium: 137 mmol/L (ref 135–145)
Total Bilirubin: 0.6 mg/dL (ref 0.0–1.2)
Total Protein: 7 g/dL (ref 6.5–8.1)

## 2023-02-11 LAB — SAMPLE TO BLOOD BANK

## 2023-02-11 LAB — MAGNESIUM: Magnesium: 1.8 mg/dL (ref 1.7–2.4)

## 2023-02-11 MED ORDER — LIDOCAINE VISCOUS HCL 2 % MT SOLN: 15.0000 mL | Freq: Four times a day (QID) | OROMUCOSAL | 0 refills | Status: DC | PRN

## 2023-02-11 MED ORDER — TRAMADOL HCL 50 MG PO TABS: 50.0000 mg | ORAL_TABLET | Freq: Two times a day (BID) | ORAL | 0 refills | Status: DC | PRN

## 2023-02-11 NOTE — Progress Notes (Signed)
 Having pain in Right ear all the way down her neck. States that her throat is also very sore and it is affecting her swallowing and being able to get food down x2 weeks.

## 2023-02-11 NOTE — Progress Notes (Signed)
 No iv fluids needed today per [facility]

## 2023-02-11 NOTE — Progress Notes (Signed)
 Symptom Management Clinic [facility] [phone] [phone]  Patient Care Team: Artemio Dobie Cathryne BROCKS, MD as PCP - General (Family Medicine) Cherilyn Debby CROME, MD as Consulting Physician (Internal Medicine) Maurie Rayfield BIRCH, RN as Oncology Nurse Navigator [name], [name], MD as Consulting Physician (Oncology) [name], MD as Consulting Physician (Radiation Oncology)   NAME OF PATIENT: April [name]  [id number]  March 13, 1940   DATE OF VISIT: 02/11/23  REASON FOR CONSULT: HORTENCE CHARTER is a 83 y.o. female with multiple medical problems including stage IIIa squamous cell carcinoma of the anus also with head neck cancer.   INTERVAL HISTORY: Patient on mitomycin  and concurrent XRT with Xeloda  for anal cancer.  Plan is to treat her head neck cancer following completion of treatment for anal cancer.  Patient presents to [facility] today for evaluation of right-sided head neck pain at known site of cancer.  She describes pain as feeling sharp and stabbing in her face.  Denies any swelling.  Does have some pain with swallowing but denies choking.  Denies shortness of breath or chest pain.  No fever or chills.  Denies nausea, vomiting, diarrhea, or constipation.  No GERD symptoms. Denies urinary complaints. Patient offers no further specific complaints today.   PAST MEDICAL HISTORY: Past Medical History:  Diagnosis Date   Diabetes mellitus without complication (HCC)    Gout    Hyperlipidemia    Hypertension    Wears dentures    full upper and lower    PAST SURGICAL HISTORY:  Past Surgical History:  Procedure Laterality Date   APPENDECTOMY     gallstones  06/12/2009   MICROLARYNGOSCOPY Right 01/28/2023   Procedure: MICRODIRECT LARYNGOSCOPY WITH BIOPSY OF OROPHARYNGEAL MASS;  Surgeon: Herminio Miu, MD;  Location: [facility];  Service: ENT;  Laterality: Right;  Diabetic    HEMATOLOGY/ONCOLOGY HISTORY:  Oncology  History  Anal squamous cell carcinoma (HCC)  01/18/2023 Initial Diagnosis   Anal squamous cell carcinoma (HCC)   01/18/2023 Cancer Staging   Staging form: Anus, AJCC V9 - Clinical stage from 01/18/2023: Stage IIIA (cT3, cN0, cM0) - Signed by [name] [name], MD on 01/18/2023 Stage prefix: Initial diagnosis   02/07/2023 -  Chemotherapy   Patient is on Treatment Plan : ANUS Mitomycin  D1,29 + Capecitabine  + XRT       ALLERGIES:  has no known allergies.  MEDICATIONS:  Current Outpatient Medications  Medication Sig Dispense Refill   Accu-Chek Softclix Lancets lancets      amLODipine (NORVASC) 10 MG tablet Take 10 mg by mouth daily.     aspirin 81 MG tablet Take 1 tablet by mouth daily.     capecitabine  (XELODA ) 500 MG tablet Take 2 tablets (1,000 mg total) by mouth 2 (two) times daily after a meal. Take Monday-Friday. Take only on days of radiation. 120 tablet 0   carvedilol  (COREG ) 3.125 MG tablet Take 1 tablet by mouth 2 (two) times daily. cardio     colchicine  0.6 MG tablet TAKE 1 TABLET(0.6 MG) BY MOUTH DAILY 10 tablet 1   diclofenac  Sodium (VOLTAREN ) 1 % GEL Research Patient: Apply 0.5 g (1 fingertip) to each hand and each foot twice daily for the duration of radiation therapy 200 g 0   glucose blood (ACCU-CHEK AVIVA PLUS) test strip USE TWICE DAILY AS DIRECTED     lisinopril -hydrochlorothiazide  (ZESTORETIC ) 20-25 MG tablet Take 1 tablet by mouth daily.     metFORMIN  (GLUCOPHAGE ) 500 MG tablet TAKE 1  TABLET TWICE DAILY (Patient taking differently: 500 mg. TAKE 1 TABLETin morning and 2 at night/ Dr Cherilyn) 180 tablet 1   prochlorperazine  (COMPAZINE ) 10 MG tablet Take 1 tablet (10 mg total) by mouth every 6 (six) hours as needed for nausea or vomiting. 30 tablet 1   simvastatin  (ZOCOR ) 20 MG tablet Take 1 tablet (20 mg total) by mouth daily. 90 tablet 1   tiZANidine  (ZANAFLEX ) 4 MG tablet Take 1 tablet (4 mg total) by mouth at bedtime. 90 tablet 1   Current  Facility-Administered Medications  Medication Dose Route Frequency Provider Last Rate Last Admin   albuterol  (PROVENTIL ) (2.5 MG/3ML) 0.083% nebulizer solution 2.5 mg  2.5 mg Nebulization Once Sharnetta Gielow Cathryne BROCKS, MD        VITAL SIGNS: BP (!) 130/50 (BP Location: Left Arm, Patient Position: Sitting, Cuff Size: Normal)   Pulse 69   Temp 97.6 F (36.4 C) (Tympanic)   Resp 16   Ht 5' 5.98 (1.676 m)   Wt 139 lb 12.8 oz (63.4 kg)   SpO2 100%   BMI 22.58 kg/m  Filed Weights   02/11/23 1040  Weight: 139 lb 12.8 oz (63.4 kg)    Estimated body mass index is 22.58 kg/m as calculated from the following:   Height as of this encounter: 5' 5.98 (1.676 m).   Weight as of this encounter: 139 lb 12.8 oz (63.4 kg).  LABS: CBC:    Component Value Date/Time   WBC 5.3 02/11/2023 1028   HGB 9.3 (L) 02/11/2023 1028   HGB 7.8 (L) 02/07/2023 0828   HGB 8.4 (L) 01/04/2023 1205   HCT 30.5 (L) 02/11/2023 1028   HCT 27.1 (L) 01/04/2023 1205   PLT 239 02/11/2023 1028   PLT 233 02/07/2023 0828   PLT 222 01/04/2023 1205   MCV 88.2 02/11/2023 1028   MCV 87 01/04/2023 1205   NEUTROABS 4.0 02/11/2023 1028   NEUTROABS 3.4 01/04/2023 1205   LYMPHSABS 1.0 02/11/2023 1028   LYMPHSABS 2.0 01/04/2023 1205   MONOABS 0.2 02/11/2023 1028   EOSABS 0.1 02/11/2023 1028   EOSABS 0.2 01/04/2023 1205   BASOSABS 0.0 02/11/2023 1028   BASOSABS 0.0 01/04/2023 1205   Comprehensive Metabolic Panel:    Component Value Date/Time   NA 137 02/07/2023 0828   NA 141 06/01/2022 1017   K 3.7 02/07/2023 0828   CL 106 02/07/2023 0828   CO2 23 02/07/2023 0828   BUN 15 02/07/2023 0828   BUN 15 06/01/2022 1017   CREATININE 1.14 (H) 02/07/2023 0828   GLUCOSE 151 (H) 02/07/2023 0828   CALCIUM 8.6 (L) 02/07/2023 0828   AST 14 (L) 02/07/2023 0828   ALT 6 02/07/2023 0828   ALKPHOS 39 02/07/2023 0828   BILITOT 0.5 02/07/2023 0828   PROT 6.8 02/07/2023 0828   PROT 7.0 06/01/2022 1017   ALBUMIN 3.4 (L) 02/07/2023 0828    ALBUMIN 4.2 06/01/2022 1017    RADIOGRAPHIC STUDIES: MM 3D SCREENING MAMMOGRAM BILATERAL BREAST Result Date: 01/21/2023 CLINICAL DATA:  Screening. EXAM: DIGITAL SCREENING BILATERAL MAMMOGRAM WITH TOMOSYNTHESIS AND CAD TECHNIQUE: Bilateral screening digital craniocaudal and mediolateral oblique mammograms were obtained. Bilateral screening digital breast tomosynthesis was performed. The images were evaluated with computer-aided detection. COMPARISON:  Previous exam(s). ACR Breast Density Category b: There are scattered areas of fibroglandular density. FINDINGS: There are no findings suspicious for malignancy. IMPRESSION: No mammographic evidence of malignancy. A result letter of this screening mammogram will be mailed directly to the patient. RECOMMENDATION: Screening mammogram in  one year. (Code:SM-B-01Y) BI-RADS CATEGORY  1: Negative. Electronically Signed   By: Rosaline Collet M.D.   On: 01/21/2023 15:49   NM PET Image Initial (PI) Skull Base To Thigh (F-18 FDG) Result Date: 01/17/2023 CLINICAL DATA:  Initial treatment strategy for rectal cancer. EXAM: NUCLEAR MEDICINE PET SKULL BASE TO THIGH TECHNIQUE: 7.3 mCi F-18 FDG was injected intravenously. Full-ring PET imaging was performed from the skull base to thigh after the radiotracer. CT data was obtained and used for attenuation correction and anatomic localization. Fasting blood glucose: 82 mg/dl COMPARISON:  None Available. FINDINGS: Mediastinal blood pool activity: SUV max 1.8 Liver activity: SUV max NA NECK: Right oropharyngeal mass measures approximately 1.6 x 2.3 cm (6/20), SUV max 20.7. Adjacent right level II lymph nodes with index lymph node measuring 7 mm (6/26), SUV max 3.4. Incidental CT findings: None. CHEST: Heterogeneous, enlarged and nodular thyroid  with areas of hypermetabolism. Index lesion on the left, SUV max 9.5. No additional abnormal hypermetabolism. Incidental CT findings: Atherosclerotic calcification of the aorta, aortic valve  and coronary arteries. Heart is enlarged. No pericardial or pleural effusion. ABDOMEN/PELVIS: Anorectal hypermetabolism, SUV max 11.4, with soft tissue fullness on CT. A discrete mass is difficult to measure without IV or enteric contrast. No perirectal hypermetabolic lymph nodes. No additional abnormal hypermetabolism. Incidental CT findings: Cholecystectomy. Low-attenuation lesions in the kidneys. No specific follow-up necessary. SKELETON: No abnormal hypermetabolism. Incidental CT findings: Degenerative changes in the spine. IMPRESSION: 1. 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 No evidence of distant metastatic disease. 2. Hypermetabolic right oropharyngeal mass with hypermetabolic right level II cervical lymph nodes, compatible with primary head/neck cancer. 3. Heterogeneous, nodular and enlarged thyroid  with areas of hypermetabolism. Recommend thyroid  US  and biopsy. (Ref: J Am Coll Radiol. 2015 Feb;12(2): 143-50). 4. Aortic atherosclerosis (ICD10-I70.0). Coronary artery calcification. Electronically Signed   By: Newell Eke M.D.   On: 01/17/2023 13:19    PERFORMANCE STATUS (ECOG) : 1 - Symptomatic but completely ambulatory  Review of Systems Unless otherwise noted, a complete review of systems is negative.  Physical Exam General: NAD HEENT: No evidence of thrush or significant lymphadenopathy Cardiovascular: regular rate and rhythm Pulmonary: clear ant fields Abdomen: soft, nontender, + bowel sounds GU: no suprapubic tenderness Extremities: no edema, no joint deformities Skin: no rashes Neurological: nonfocal  IMPRESSION/PLAN: Anal cancer -on current treatment with mitomycin  XRT in [place]   Head neck cancer -pending treatment.  Will send referral to SLP and nutrition.  Neoplasm related pain -patient opioid nave.  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  Patient verbalized understanding  that she cannot take opioid pain medications and work/drive.  Will start her on low-dose tramadol  for utilization at night and weekends.  Will also send prescription for viscous lidocaine  which she can utilize more liberally.  No evidence of thrush on exam.  Discussed strategies to prevent opioid-induced constipation.   Patient expressed understanding and was in agreement with this plan. She also understands that She can call clinic at any time with any questions, concerns, or complaints.   Thank you for allowing me to participate in the care of this very pleasant patient.   Time Total: 15 minutes  Visit consisted of counseling and education dealing with the complex and emotionally intense issues of symptom management in the setting of serious illness.Greater than 50%  of this time was spent counseling and coordinating care related to the above assessment and plan.  Signed by: Fonda  Diego Delancey, PhD, NP-C

## 2023-02-14 ENCOUNTER — Other Ambulatory Visit: Payer: Self-pay

## 2023-02-14 ENCOUNTER — Other Ambulatory Visit: Payer: Self-pay | Admitting: *Deleted

## 2023-02-14 ENCOUNTER — Inpatient Hospital Stay (HOSPITAL_BASED_OUTPATIENT_CLINIC_OR_DEPARTMENT_OTHER): Payer: Medicare HMO | Admitting: Oncology

## 2023-02-14 ENCOUNTER — Ambulatory Visit: Payer: Medicare HMO | Admitting: Oncology

## 2023-02-14 ENCOUNTER — Inpatient Hospital Stay: Payer: Medicare HMO

## 2023-02-14 ENCOUNTER — Ambulatory Visit
Admission: RE | Admit: 2023-02-14 | Discharge: 2023-02-14 | Disposition: A | Discharge status: Home / Self Care | Payer: Medicare HMO | Source: Ambulatory Visit | Attending: Radiation Oncology | Admitting: Radiation Oncology

## 2023-02-14 ENCOUNTER — Encounter: Payer: Self-pay | Admitting: Oncology

## 2023-02-14 VITALS — BP 120/52 | HR 71 | Temp 99.0°F | Resp 16 | Ht 65.98 in | Wt 138.0 lb

## 2023-02-14 DIAGNOSIS — C218 Malignant neoplasm of overlapping sites of rectum, anus and anal canal: Secondary | ICD-10-CM | POA: Diagnosis not present

## 2023-02-14 DIAGNOSIS — C210 Malignant neoplasm of anus, unspecified: Secondary | ICD-10-CM | POA: Diagnosis not present

## 2023-02-14 DIAGNOSIS — Z5111 Encounter for antineoplastic chemotherapy: Secondary | ICD-10-CM | POA: Diagnosis not present

## 2023-02-14 DIAGNOSIS — Z510 Encounter for antineoplastic radiation therapy: Secondary | ICD-10-CM | POA: Diagnosis not present

## 2023-02-14 DIAGNOSIS — D649 Anemia, unspecified: Secondary | ICD-10-CM | POA: Diagnosis not present

## 2023-02-14 DIAGNOSIS — E041 Nontoxic single thyroid nodule: Secondary | ICD-10-CM | POA: Diagnosis not present

## 2023-02-14 DIAGNOSIS — R1310 Dysphagia, unspecified: Secondary | ICD-10-CM | POA: Diagnosis not present

## 2023-02-14 DIAGNOSIS — C20 Malignant neoplasm of rectum: Secondary | ICD-10-CM | POA: Diagnosis not present

## 2023-02-14 DIAGNOSIS — E119 Type 2 diabetes mellitus without complications: Secondary | ICD-10-CM | POA: Diagnosis not present

## 2023-02-14 LAB — RAD ONC ARIA SESSION SUMMARY
Course Elapsed Days: 11
Plan Fractions Treated to Date: 7
Plan Prescribed Dose Per Fraction: 1.8 Gy
Plan Total Fractions Prescribed: 30
Plan Total Prescribed Dose: 54 Gy
Reference Point Dosage Given to Date: 12.6 Gy
Reference Point Session Dosage Given: 1.8 Gy
Session Number: 7

## 2023-02-14 NOTE — Progress Notes (Signed)
 Would like something sent in for diarrhea that started this morning.

## 2023-02-14 NOTE — Progress Notes (Signed)
 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  INTERVAL HISTORY: Patient returns to clinic today for repeat laboratory work and further evaluation.  She continues with daily XRT and Xeloda .  She also received her first dose of IV mitomycin  last week.  She is tolerating her treatments well without significant side effects.  Patient had mild diarrhea this morning.  She denies any further bleeding.  She does not complain of pain today.  She has no neurologic complaints.  She denies any recent fevers or illnesses.  She has a good appetite and denies weight loss.  She has no chest pain, shortness of breath, cough, or hemoptysis.  She denies any nausea, vomiting, or constipation.  She has no melena or hematochezia.  She has no urinary complaints.  Patient offers no further specific complaints today.  REVIEW OF SYSTEMS:   Review of Systems  Constitutional: Negative.  Negative for fever, malaise/fatigue and weight loss.  HENT:  Negative for sore throat.   Respiratory: Negative.  Negative for cough, hemoptysis and shortness of breath.   Cardiovascular: Negative.  Negative for chest pain and leg swelling.  Gastrointestinal:  Positive for diarrhea. Negative for abdominal pain, blood in stool and melena.  Genitourinary: Negative.  Negative for dysuria.  Musculoskeletal: Negative.  Negative for back pain.  Skin: Negative.  Negative for rash.  Neurological: Negative.  Negative  for dizziness, focal weakness, weakness and headaches.  Psychiatric/Behavioral: Negative.  The patient is not nervous/anxious.    As per HPI. Otherwise, a complete review of systems is negative.  PAST MEDICAL HISTORY: Past Medical History:  Diagnosis Date   Diabetes mellitus without complication (HCC)    Gout    Hyperlipidemia    Hypertension    Wears dentures    full upper and lower    PAST SURGICAL HISTORY: Past Surgical History:  Procedure Laterality Date   APPENDECTOMY     gallstones  06/12/2009   MICROLARYNGOSCOPY Right 01/28/2023   Procedure: MICRODIRECT LARYNGOSCOPY WITH BIOPSY OF OROPHARYNGEAL MASS;  Surgeon: Herminio Miu, MD;  Location: [facility];  Service: ENT;  Laterality: Right;  Diabetic    FAMILY HISTORY: Family History  Problem Relation Age of Onset   Breast cancer Neg Hx     ADVANCED DIRECTIVES (Y/N):  N  HEALTH MAINTENANCE: Social History   Tobacco Use   Smoking status: Former    Current packs/day: 0.00    Average packs/day: 0.5 packs/day for 8.0 years (4.0 ttl pk-yrs)    Types: Cigarettes    Start date: 32    Quit date: 30    Years since quitting: 35.0    Passive exposure: Past   Smokeless tobacco: Never  Vaping Use   Vaping status: Never Used  Substance Use Topics   Alcohol use: No    Alcohol/week: 0.0 standard drinks of alcohol   Drug use: No     Colonoscopy:  PAP:  Bone density:  Lipid panel:  No Known Allergies  Current Outpatient Medications  Medication Sig Dispense Refill   Accu-Chek  Softclix Lancets lancets      amLODipine (NORVASC) 10 MG tablet Take 10 mg by mouth daily.     aspirin 81 MG tablet Take 1 tablet by mouth daily.     capecitabine  (XELODA ) 500 MG tablet Take 2 tablets (1,000 mg total) by mouth 2 (two) times daily after a meal. Take Monday-Friday. Take only on days of radiation. 120 tablet 0   carvedilol  (COREG ) 3.125 MG tablet Take 1 tablet by mouth 2 (two) times daily. cardio     colchicine  0.6  MG tablet TAKE 1 TABLET(0.6 MG) BY MOUTH DAILY 10 tablet 1   diclofenac  Sodium (VOLTAREN ) 1 % GEL Research Patient: Apply 0.5 g (1 fingertip) to each hand and each foot twice daily for the duration of radiation therapy 200 g 0   glucose blood (ACCU-CHEK AVIVA PLUS) test strip USE TWICE DAILY AS DIRECTED     lidocaine  (XYLOCAINE ) 2 % solution Use as directed 15 mLs in the mouth or throat every 6 (six) hours as needed for mouth pain. 100 mL 0   lisinopril -hydrochlorothiazide  (ZESTORETIC ) 20-25 MG tablet Take 1 tablet by mouth daily.     metFORMIN  (GLUCOPHAGE ) 500 MG tablet TAKE 1 TABLET TWICE DAILY (Patient taking differently: 500 mg. TAKE 1 TABLETin morning and 2 at night/ Dr Cherilyn) 180 tablet 1   prochlorperazine  (COMPAZINE ) 10 MG tablet Take 1 tablet (10 mg total) by mouth every 6 (six) hours as needed for nausea or vomiting. 30 tablet 1   simvastatin  (ZOCOR ) 20 MG tablet Take 1 tablet (20 mg total) by mouth daily. 90 tablet 1   tiZANidine  (ZANAFLEX ) 4 MG tablet Take 1 tablet (4 mg total) by mouth at bedtime. 90 tablet 1   traMADol  (ULTRAM ) 50 MG tablet Take 1 tablet (50 mg total) by mouth every 12 (twelve) hours as needed. 30 tablet 0   Current Facility-Administered Medications  Medication Dose Route Frequency Provider Last Rate Last Admin   albuterol  (PROVENTIL ) (2.5 MG/3ML) 0.083% nebulizer solution 2.5 mg  2.5 mg Nebulization Once Joshua Roca C, MD        OBJECTIVE: Vitals:   02/14/23 1025  BP: (!) 120/52  Pulse: 71  Resp: 16  Temp: 99 F (37.2 C)  SpO2: 100%     Body mass index is 22.29 kg/m.    ECOG FS:0 - Asymptomatic  General: Well-developed, well-nourished, no acute distress. Eyes: Pink conjunctiva, anicteric sclera. HEENT: Normocephalic, moist mucous membranes. Lungs: No audible wheezing or coughing. Heart: Regular rate and rhythm. Abdomen: Soft, nontender, no obvious distention. Musculoskeletal: No edema, cyanosis, or clubbing. Neuro: Alert, answering all  questions appropriately. Cranial nerves grossly intact. Skin: No rashes or petechiae noted. Psych: Normal affect.  LAB RESULTS:  Lab Results  Component Value Date   NA 137 02/11/2023   K 4.4 02/11/2023   CL 106 02/11/2023   CO2 23 02/11/2023   GLUCOSE 98 02/11/2023   BUN 18 02/11/2023   CREATININE 1.10 (H) 02/11/2023   CALCIUM 9.1 02/11/2023   PROT 7.0 02/11/2023   ALBUMIN 3.8 02/11/2023   AST 15 02/11/2023   ALT 7 02/11/2023   ALKPHOS 44 02/11/2023   BILITOT 0.6 02/11/2023   GFRNONAA 50 (L) 02/11/2023   GFRAA 59 (L) 09/24/2019    Lab Results  Component Value Date   WBC 5.3 02/11/2023   NEUTROABS 4.0 02/11/2023   HGB 9.3 (L) 02/11/2023   HCT 30.5 (L) 02/11/2023   MCV 88.2 02/11/2023   PLT 239 02/11/2023     STUDIES:  MM 3D SCREENING MAMMOGRAM BILATERAL BREAST Result Date: 01/21/2023 CLINICAL DATA:  Screening. EXAM: DIGITAL SCREENING BILATERAL MAMMOGRAM WITH TOMOSYNTHESIS AND CAD TECHNIQUE: Bilateral screening digital craniocaudal and mediolateral oblique mammograms were obtained. Bilateral screening digital breast tomosynthesis was performed. The images were evaluated with computer-aided detection. COMPARISON:  Previous exam(s). ACR Breast Density Category b: There are scattered areas of fibroglandular density. FINDINGS: There are no findings suspicious for malignancy. IMPRESSION: No mammographic evidence of malignancy. A result letter of this screening mammogram will be mailed directly to the patient. RECOMMENDATION: Screening mammogram in one year. (Code:SM-B-01Y) BI-RADS CATEGORY  1: Negative. Electronically Signed   By: Rosaline Collet M.D.   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  Agree with radiation oncology assessment that lesion is likely greater than 5 cm increasing stage IIIa.  Patient will be benefit from daily XRT along with concurrent chemotherapy using IV mitomycin  on days 1 and 29 along with oral Xeloda  on the days of her radiation.  Continue treatment as scheduled.  Return to clinic on January 15 for laboratory work and consultation with dietary.  Patient will then return to clinic on January 27 for further evaluation and consideration of her second infusion of mitomycin .   Head and neck cancer: PET scan results as above.  Biopsy confirmed squamous cell carcinoma.  Will hold treatment until completion of treatment for anal cancer.  Patient likely will require port placement to complete weekly cisplatin  treatments.  Will Thyroid  nodule with hypermetabolism: Defer ultrasound and biopsy until completion of treatment of 2 malignancies above.. Anemia: Hemoglobin improved to 9.3 with transfusion.  Monitor.   Hypomagnesia: Resolved with IV magnesium . Renal insufficiency: Mild, monitor.  Patient expressed understanding and was in agreement with this plan. She also understands that She can call clinic at any time with any questions, concerns, or complaints.    Cancer Staging  Anal squamous cell carcinoma (HCC) Staging form: Anus, AJCC V9 - Clinical stage from 01/18/2023: Stage IIIA (cT3, cN0, cM0) - Signed by [name] [name], MD on 01/18/2023 Stage prefix: Initial diagnosis   [name] [name], MD   02/14/2023 12:36 PM

## 2023-02-15 ENCOUNTER — Inpatient Hospital Stay: Payer: Medicare HMO

## 2023-02-15 ENCOUNTER — Other Ambulatory Visit: Payer: Self-pay

## 2023-02-15 ENCOUNTER — Encounter
Admission: RE | Admit: 2023-02-15 | Discharge: 2023-02-15 | Disposition: A | Discharge status: Home / Self Care | Payer: Medicare PPO | Source: Ambulatory Visit | Attending: Surgery | Admitting: Surgery

## 2023-02-15 ENCOUNTER — Ambulatory Visit
Admission: RE | Admit: 2023-02-15 | Discharge: 2023-02-15 | Disposition: A | Discharge status: Home / Self Care | Payer: Medicare HMO | Source: Ambulatory Visit | Attending: Radiation Oncology | Admitting: Radiation Oncology

## 2023-02-15 ENCOUNTER — Ambulatory Visit: Payer: Self-pay | Admitting: Surgery

## 2023-02-15 ENCOUNTER — Ambulatory Visit: Payer: Medicare PPO | Admitting: Surgery

## 2023-02-15 VITALS — BP 137/52 | HR 63 | Resp 14 | Ht 66.0 in | Wt 137.0 lb

## 2023-02-15 DIAGNOSIS — Z01818 Encounter for other preprocedural examination: Secondary | ICD-10-CM | POA: Diagnosis not present

## 2023-02-15 DIAGNOSIS — E041 Nontoxic single thyroid nodule: Secondary | ICD-10-CM | POA: Diagnosis not present

## 2023-02-15 DIAGNOSIS — I10 Essential (primary) hypertension: Secondary | ICD-10-CM

## 2023-02-15 DIAGNOSIS — D649 Anemia, unspecified: Secondary | ICD-10-CM | POA: Diagnosis not present

## 2023-02-15 DIAGNOSIS — E119 Type 2 diabetes mellitus without complications: Secondary | ICD-10-CM

## 2023-02-15 DIAGNOSIS — E785 Hyperlipidemia, unspecified: Secondary | ICD-10-CM | POA: Diagnosis not present

## 2023-02-15 DIAGNOSIS — C210 Malignant neoplasm of anus, unspecified: Secondary | ICD-10-CM

## 2023-02-15 DIAGNOSIS — E1169 Type 2 diabetes mellitus with other specified complication: Secondary | ICD-10-CM | POA: Diagnosis not present

## 2023-02-15 DIAGNOSIS — Z5111 Encounter for antineoplastic chemotherapy: Secondary | ICD-10-CM | POA: Diagnosis not present

## 2023-02-15 DIAGNOSIS — R1310 Dysphagia, unspecified: Secondary | ICD-10-CM | POA: Diagnosis not present

## 2023-02-15 DIAGNOSIS — C218 Malignant neoplasm of overlapping sites of rectum, anus and anal canal: Secondary | ICD-10-CM | POA: Diagnosis not present

## 2023-02-15 DIAGNOSIS — C20 Malignant neoplasm of rectum: Secondary | ICD-10-CM | POA: Diagnosis not present

## 2023-02-15 DIAGNOSIS — Z510 Encounter for antineoplastic radiation therapy: Secondary | ICD-10-CM | POA: Diagnosis not present

## 2023-02-15 DIAGNOSIS — I872 Venous insufficiency (chronic) (peripheral): Secondary | ICD-10-CM | POA: Insufficient documentation

## 2023-02-15 HISTORY — DX: Anemia, unspecified: D64.9

## 2023-02-15 LAB — RAD ONC ARIA SESSION SUMMARY
Course Elapsed Days: 12
Plan Fractions Treated to Date: 8
Plan Prescribed Dose Per Fraction: 1.8 Gy
Plan Total Fractions Prescribed: 30
Plan Total Prescribed Dose: 54 Gy
Reference Point Dosage Given to Date: 14.4 Gy
Reference Point Session Dosage Given: 1.8 Gy
Session Number: 8

## 2023-02-15 NOTE — Progress Notes (Deleted)
 Patient ID: April [name], female   DOB: 12-07-1940, 83 y.o.   MRN: [id number]  Chief Complaint:  Need for IV chemotherapy for SCC.   History of Present Illness April [name] is a 83 y.o. 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  Plan is to begin chemoradiation, and she will need sustainable vascular access.  Past Medical History Past Medical History:  Diagnosis Date   Diabetes mellitus without complication (HCC)    Gout    Hyperlipidemia    Hypertension    Wears dentures    full upper and lower      Past Surgical History:  Procedure Laterality Date   APPENDECTOMY     gallstones  06/12/2009   MICROLARYNGOSCOPY Right 01/28/2023   Procedure: MICRODIRECT LARYNGOSCOPY WITH BIOPSY OF OROPHARYNGEAL MASS;  Surgeon: Herminio Miu, MD;  Location: [facility];  Service: ENT;  Laterality: Right;  Diabetic    No Known Allergies  Current Outpatient Medications  Medication Sig Dispense Refill   Accu-Chek Softclix Lancets lancets      amLODipine (NORVASC) 10 MG tablet Take 10 mg by mouth daily.     aspirin 81 MG tablet Take 1 tablet by mouth daily.     capecitabine  (XELODA ) 500 MG tablet Take 2 tablets (1,000 mg total) by mouth 2 (two) times daily after a meal. Take Monday-Friday. Take only on days of radiation. 120 tablet 0   carvedilol  (COREG ) 3.125 MG tablet Take 1 tablet by mouth 2 (two) times daily. cardio     colchicine  0.6 MG tablet TAKE 1 TABLET(0.6 MG) BY MOUTH DAILY 10 tablet 1   diclofenac  Sodium (VOLTAREN ) 1 % GEL Research Patient: Apply 0.5 g (1 fingertip) to each hand and each foot twice daily for the duration of radiation therapy 200 g 0   glucose blood (ACCU-CHEK AVIVA PLUS) test strip USE TWICE DAILY AS DIRECTED     lidocaine  (XYLOCAINE ) 2 % solution Use as directed 15 mLs in the mouth or throat every 6 (six) hours as needed for mouth pain. 100 mL 0   lisinopril -hydrochlorothiazide  (ZESTORETIC ) 20-25 MG tablet Take 1  tablet by mouth daily.     metFORMIN  (GLUCOPHAGE ) 500 MG tablet TAKE 1 TABLET TWICE DAILY (Patient taking differently: 500 mg. TAKE 1 TABLETin morning and 2 at night/ Dr Cherilyn) 180 tablet 1   prochlorperazine  (COMPAZINE ) 10 MG tablet Take 1 tablet (10 mg total) by mouth every 6 (six) hours as needed for nausea or vomiting. 30 tablet 1   simvastatin  (ZOCOR ) 20 MG tablet Take 1 tablet (20 mg total) by mouth daily. 90 tablet 1   tiZANidine  (ZANAFLEX ) 4 MG tablet Take 1 tablet (4 mg total) by mouth at bedtime. 90 tablet 1   traMADol  (ULTRAM ) 50 MG tablet Take 1 tablet (50 mg total) by mouth every 12 (twelve) hours as needed. 30 tablet 0   Current Facility-Administered Medications  Medication Dose Route Frequency Provider Last Rate Last Admin   albuterol  (PROVENTIL ) (2.5 MG/3ML) 0.083% nebulizer solution 2.5 mg  2.5 mg Nebulization Once Jones, Deanna C, MD        Family History Family History  Problem Relation Age of Onset   Breast cancer Neg Hx       Social History Social History   Tobacco Use   Smoking status: Former    Current packs/day: 0.00    Average packs/day: 0.5 packs/day for 8.0 years (4.0 ttl pk-yrs)    Types: Cigarettes    Start  date: 25    Quit date: 48    Years since quitting: 35.0    Passive exposure: Past   Smokeless tobacco: Never  Vaping Use   Vaping status: Never Used  Substance Use Topics   Alcohol use: No    Alcohol/week: 0.0 standard drinks of alcohol   Drug use: No     Physical Exam There were no vitals taken for this visit.   CONSTITUTIONAL: Well developed, and nourished, appropriately responsive and aware without distress.   EYES: Sclera non-icteric.   EARS, NOSE, MOUTH AND THROAT:  The oropharynx is clear. Oral mucosa is pink and moist.    Hearing is intact to voice.  NECK: Trachea is midline, and there is no jugular venous distension.  LYMPH NODES:  Lymph nodes in the neck are not appreciated. RESPIRATORY:  Lungs are clear, and breath  sounds are equal bilaterally.  Normal respiratory effort without pathologic use of accessory muscles. CARDIOVASCULAR: Heart is regular in rate and rhythm.   Well perfused.  GI: The abdomen is soft, nontender, and nondistended. There were no palpable masses.  I did not appreciate hepatosplenomegaly.  MUSCULOSKELETAL:  Symmetrical muscle tone appreciated in all four extremities.    SKIN: Skin turgor is normal. No pathologic skin lesions appreciated.  NEUROLOGIC:  Motor and sensation appear grossly normal.  Cranial nerves are grossly without defect. PSYCH:  Alert and oriented to person, place and time. Affect is appropriate for situation.  Data Reviewed I have personally reviewed what is currently available of the patient's imaging, recent labs and medical records.   Labs:     Latest Ref Rng & Units 02/11/2023   10:28 AM 02/07/2023    8:28 AM 01/18/2023   11:35 AM  CBC  WBC 4.0 - 10.5 K/uL 5.3  5.2  6.9   Hemoglobin 12.0 - 15.0 g/dL 9.3  7.8  8.7   Hematocrit 36.0 - 46.0 % 30.5  25.3  28.2   Platelets 150 - 400 K/uL 239  233  284       Latest Ref Rng & Units 02/11/2023   10:28 AM 02/07/2023    8:28 AM 01/18/2023   11:36 AM  CMP  Glucose 70 - 99 mg/dL 98  848  864   BUN 8 - 23 mg/dL 18  15  19    Creatinine 0.44 - 1.00 mg/dL 8.89  8.85  8.89   Sodium 135 - 145 mmol/L 137  137  140   Potassium 3.5 - 5.1 mmol/L 4.4  3.7  3.9   Chloride 98 - 111 mmol/L 106  106  108   CO2 22 - 32 mmol/L 23  23  25    Calcium 8.9 - 10.3 mg/dL 9.1  8.6  9.2   Total Protein 6.5 - 8.1 g/dL 7.0  6.8  7.4   Total Bilirubin 0.0 - 1.2 mg/dL 0.6  0.5  0.7   Alkaline Phos 38 - 126 U/L 44  39  40   AST 15 - 41 U/L 15  14  16    ALT 0 - 44 U/L 7  6  7        Imaging: Radiological images reviewed:   Within last 24 hrs: No results found.  Assessment    Need for durable central venous access for chemotherapy. Peripheral venous insufficiency. 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  Risks of central venous line/Port-A-Cath placement discussed in detail.  These risks include but are not limited to anesthesia, bleeding, infection, thrombosis, venous/vascular injury, possible pneumothorax, cardiac arrhythmia etc.  Believe the patient understands that these risks are not all-inclusive and that catheter placement may require tunneling to other potential sites.  Questions answered to what I believe is the patient's satisfaction.  No guarantees were ever expressed or implied.    Face-to-face time spent with the patient and accompanying care providers(if present) was *** minutes, with more than 50% of the time spent counseling, educating, and coordinating care of the patient.    These notes generated with voice recognition software. I apologize for typographical errors.  [name] M.D., FACS 02/15/2023, 9:04 AM

## 2023-02-15 NOTE — Patient Instructions (Addendum)
 Your procedure is scheduled on: Friday 02/18/23 To find out your arrival time, please call [phone] between 1PM - 3PM on:   Thursday 02/17/23 Report to the Registration Desk on the 1st floor of the [facility]. FREE Valet parking is available.  If your arrival time is 6:00 am, do not arrive before that time as the [facility] entrance doors do not open until 6:00 am.  REMEMBER: Instructions that are not followed completely may result in serious medical risk, up to and including death; or upon the discretion of your surgeon and anesthesiologist your surgery may need to be rescheduled.  Do not eat food or drink any liquids after midnight the night before surgery.  No gum chewing or hard candies.  One week prior to surgery: Stop Anti-inflammatories (NSAIDS) such as Advil, Aleve , Ibuprofen, Motrin, Naproxen , Naprosyn  and Aspirin based products such as Excedrin, Goody's Powder, BC Powder. You may however, continue to take Tylenol  if needed for pain up until the day of surgery.  Stop ANY OVER THE COUNTER supplements and vitamins for 7 days until after surgery.  Continue taking all prescribed medications with the exception of the following: Hold Metformin  for 2 days, last dose will be tonight 02/15/23.  TAKE ONLY THESE MEDICATIONS THE MORNING OF SURGERY WITH A SIP OF WATER :  amLODipine (NORVASC) 10 MG tablet  carvedilol  (COREG ) 3.125 MG tablet  simvastatin  (ZOCOR ) 20 MG tablet   No Alcohol for 24 hours before or after surgery.  No Smoking including e-cigarettes for 24 hours before surgery.  No chewable tobacco products for at least 6 hours before surgery.  No nicotine patches on the day of surgery.  Do not use any recreational drugs for at least a week (preferably 2 weeks) before your surgery.  Please be advised that the combination of cocaine and anesthesia may have negative outcomes, up to and including death. If you test positive for cocaine, your surgery will be cancelled.  On  the morning of surgery brush your teeth with toothpaste and water , you may rinse your mouth with mouthwash if you wish. Do not swallow any toothpaste or mouthwash.  Use CHG Soap or wipes as directed on instruction sheet.  Do not wear lotions, powders, or perfumes. No deodorant.  Do not shave body hair from the neck down 48 hours before surgery.  Wear clean comfortable clothing (specific to your surgery type) to the hospital.  Do not wear jewelry, make-up, hairpins, clips or nail polish.  For welded (permanent) jewelry: bracelets, anklets, waist bands, etc.  Please have this removed prior to surgery.  If it is not removed, there is a chance that hospital personnel will need to cut it off on the day of surgery. Contact lenses, hearing aids and dentures may not be worn into surgery.  Do not bring valuables to the hospital. [facility] is not responsible for any missing/lost belongings or valuables.   Notify your doctor if there is any change in your medical condition (cold, fever, infection).  If you are being discharged the day of surgery, you will not be allowed to drive home. You will need a responsible individual to drive you home and stay with you for 24 hours after surgery.   If you are taking public transportation, you will need to have a responsible individual with you.  If you are being admitted to the hospital overnight, leave your suitcase in the car. After surgery it may be brought to your room.  In case of increased patient census,  it may be necessary for you, the patient, to continue your postoperative care in the Same Day Surgery department.  After surgery, you can help prevent lung complications by doing breathing exercises.  Take deep breaths and cough every 1-2 hours. Your doctor may order a device called an Incentive Spirometer to help you take deep breaths. When coughing or sneezing, hold a pillow firmly against your incision with both hands. This is called  "splinting." Doing this helps protect your incision. It also decreases belly discomfort.  Surgery Visitation Policy:  Patients undergoing a surgery or procedure may have two family members or support persons with them as long as the person is not COVID-19 positive or experiencing its symptoms.   Inpatient Visitation:    Visiting hours are 7 a.m. to 8 p.m. Up to four visitors are allowed at one time in a patient room. The visitors may rotate out with other people during the day. One designated support person (adult) may remain overnight.  Please call the Pre-admissions Testing Dept. at [phone] if you have any questions about these instructions.     Preparing for Surgery with CHLORHEXIDINE  GLUCONATE (CHG) Soap  Chlorhexidine  Gluconate (CHG) Soap  o An antiseptic cleaner that kills germs and bonds with the skin to continue killing germs even after washing  o Used for showering the night before surgery and morning of surgery  Before surgery, you can play an important role by reducing the number of germs on your skin.  CHG (Chlorhexidine  gluconate) soap is an antiseptic cleanser which kills germs and bonds with the skin to continue killing germs even after washing.  Please do not use if you have an allergy to CHG or antibacterial soaps. If your skin becomes reddened/irritated stop using the CHG.  1. Shower the NIGHT BEFORE SURGERY and the MORNING OF SURGERY with CHG soap.  2. If you choose to wash your hair, wash your hair first as usual with your normal shampoo.  3. After shampooing, rinse your hair and body thoroughly to remove the shampoo.  4. Use CHG as you would any other liquid soap. You can apply CHG directly to the skin and wash gently with a scrungie or a clean washcloth.  5. Apply the CHG soap to your body only from the neck down. Do not use on open wounds or open sores. Avoid contact with your eyes, ears, mouth, and genitals (private parts). Wash face and genitals  (private parts) with your normal soap.  6. Wash thoroughly, paying special attention to the area where your surgery will be performed.  7. Thoroughly rinse your body with warm water .  8. Do not shower/wash with your normal soap after using and rinsing off the CHG soap.  9. Pat yourself dry with a clean towel.  10. Wear clean pajamas to bed the night before surgery.  12. Place clean sheets on your bed the night of your first shower and do not sleep with pets.  13. Shower again with the CHG soap on the day of surgery prior to arriving at the hospital.  14. Do not apply any deodorants/lotions/powders.  15. Please wear clean clothes to the hospital.

## 2023-02-15 NOTE — Progress Notes (Signed)
 Today's EKG reviewed by Dr. Joelene Millin (anesthesia); no new changes.

## 2023-02-16 ENCOUNTER — Other Ambulatory Visit: Payer: Self-pay

## 2023-02-16 ENCOUNTER — Ambulatory Visit
Admission: RE | Admit: 2023-02-16 | Discharge: 2023-02-16 | Disposition: A | Discharge status: Home / Self Care | Payer: Medicare HMO | Source: Ambulatory Visit | Attending: Radiation Oncology | Admitting: Radiation Oncology

## 2023-02-16 DIAGNOSIS — C20 Malignant neoplasm of rectum: Secondary | ICD-10-CM | POA: Diagnosis not present

## 2023-02-16 DIAGNOSIS — E119 Type 2 diabetes mellitus without complications: Secondary | ICD-10-CM | POA: Diagnosis not present

## 2023-02-16 DIAGNOSIS — E041 Nontoxic single thyroid nodule: Secondary | ICD-10-CM | POA: Diagnosis not present

## 2023-02-16 DIAGNOSIS — R1310 Dysphagia, unspecified: Secondary | ICD-10-CM | POA: Diagnosis not present

## 2023-02-16 DIAGNOSIS — Z510 Encounter for antineoplastic radiation therapy: Secondary | ICD-10-CM | POA: Diagnosis not present

## 2023-02-16 DIAGNOSIS — D649 Anemia, unspecified: Secondary | ICD-10-CM | POA: Diagnosis not present

## 2023-02-16 DIAGNOSIS — Z5111 Encounter for antineoplastic chemotherapy: Secondary | ICD-10-CM | POA: Diagnosis not present

## 2023-02-16 DIAGNOSIS — C218 Malignant neoplasm of overlapping sites of rectum, anus and anal canal: Secondary | ICD-10-CM | POA: Diagnosis not present

## 2023-02-16 LAB — RAD ONC ARIA SESSION SUMMARY
Course Elapsed Days: 13
Plan Fractions Treated to Date: 9
Plan Prescribed Dose Per Fraction: 1.8 Gy
Plan Total Fractions Prescribed: 30
Plan Total Prescribed Dose: 54 Gy
Reference Point Dosage Given to Date: 16.2 Gy
Reference Point Session Dosage Given: 1.8 Gy
Session Number: 9

## 2023-02-17 ENCOUNTER — Ambulatory Visit
Admission: RE | Admit: 2023-02-17 | Discharge: 2023-02-17 | Disposition: A | Discharge status: Home / Self Care | Payer: Medicare HMO | Source: Ambulatory Visit | Attending: Radiation Oncology | Admitting: Radiation Oncology

## 2023-02-17 ENCOUNTER — Ambulatory Visit: Payer: Medicare HMO | Admitting: Surgery

## 2023-02-17 ENCOUNTER — Other Ambulatory Visit: Payer: Self-pay

## 2023-02-17 DIAGNOSIS — C20 Malignant neoplasm of rectum: Secondary | ICD-10-CM | POA: Diagnosis not present

## 2023-02-17 DIAGNOSIS — D649 Anemia, unspecified: Secondary | ICD-10-CM | POA: Diagnosis not present

## 2023-02-17 DIAGNOSIS — E119 Type 2 diabetes mellitus without complications: Secondary | ICD-10-CM | POA: Diagnosis not present

## 2023-02-17 DIAGNOSIS — E041 Nontoxic single thyroid nodule: Secondary | ICD-10-CM | POA: Diagnosis not present

## 2023-02-17 DIAGNOSIS — Z510 Encounter for antineoplastic radiation therapy: Secondary | ICD-10-CM | POA: Diagnosis not present

## 2023-02-17 DIAGNOSIS — Z5111 Encounter for antineoplastic chemotherapy: Secondary | ICD-10-CM | POA: Diagnosis not present

## 2023-02-17 DIAGNOSIS — R1310 Dysphagia, unspecified: Secondary | ICD-10-CM | POA: Diagnosis not present

## 2023-02-17 DIAGNOSIS — C218 Malignant neoplasm of overlapping sites of rectum, anus and anal canal: Secondary | ICD-10-CM | POA: Diagnosis not present

## 2023-02-17 LAB — RAD ONC ARIA SESSION SUMMARY
Course Elapsed Days: 14
Plan Fractions Treated to Date: 10
Plan Prescribed Dose Per Fraction: 1.8 Gy
Plan Total Fractions Prescribed: 30
Plan Total Prescribed Dose: 54 Gy
Reference Point Dosage Given to Date: 18 Gy
Reference Point Session Dosage Given: 1.8 Gy
Session Number: 10

## 2023-02-18 ENCOUNTER — Ambulatory Visit: Payer: Medicare PPO | Admitting: Anesthesiology

## 2023-02-18 ENCOUNTER — Ambulatory Visit: Payer: Medicare HMO

## 2023-02-18 ENCOUNTER — Encounter: Payer: Self-pay | Admitting: Surgery

## 2023-02-18 ENCOUNTER — Ambulatory Visit
Admission: RE | Admit: 2023-02-18 | Discharge: 2023-02-18 | Disposition: A | Discharge status: Home / Self Care | Payer: Medicare PPO | Attending: Surgery | Admitting: Surgery

## 2023-02-18 ENCOUNTER — Other Ambulatory Visit: Payer: Self-pay

## 2023-02-18 ENCOUNTER — Ambulatory Visit: Payer: Medicare PPO

## 2023-02-18 ENCOUNTER — Encounter
Admission: RE | Disposition: A | Discharge status: Home / Self Care | Payer: Self-pay | Source: Home / Self Care | Attending: Surgery

## 2023-02-18 DIAGNOSIS — I10 Essential (primary) hypertension: Secondary | ICD-10-CM | POA: Diagnosis not present

## 2023-02-18 DIAGNOSIS — C44520 Squamous cell carcinoma of anal skin: Secondary | ICD-10-CM | POA: Diagnosis not present

## 2023-02-18 DIAGNOSIS — R9389 Abnormal findings on diagnostic imaging of other specified body structures: Secondary | ICD-10-CM | POA: Diagnosis not present

## 2023-02-18 DIAGNOSIS — E119 Type 2 diabetes mellitus without complications: Secondary | ICD-10-CM | POA: Diagnosis not present

## 2023-02-18 DIAGNOSIS — C210 Malignant neoplasm of anus, unspecified: Secondary | ICD-10-CM | POA: Diagnosis not present

## 2023-02-18 DIAGNOSIS — C799 Secondary malignant neoplasm of unspecified site: Secondary | ICD-10-CM | POA: Diagnosis not present

## 2023-02-18 DIAGNOSIS — Z87891 Personal history of nicotine dependence: Secondary | ICD-10-CM | POA: Insufficient documentation

## 2023-02-18 DIAGNOSIS — Z452 Encounter for adjustment and management of vascular access device: Secondary | ICD-10-CM | POA: Diagnosis not present

## 2023-02-18 DIAGNOSIS — E1169 Type 2 diabetes mellitus with other specified complication: Secondary | ICD-10-CM

## 2023-02-18 DIAGNOSIS — Z79899 Other long term (current) drug therapy: Secondary | ICD-10-CM | POA: Diagnosis not present

## 2023-02-18 HISTORY — PX: PORTACATH PLACEMENT: SHX2246

## 2023-02-18 LAB — GLUCOSE, CAPILLARY
Glucose-Capillary: 77 mg/dL (ref 70–99)
Glucose-Capillary: 85 mg/dL (ref 70–99)

## 2023-02-18 SURGERY — INSERTION, TUNNELED CENTRAL VENOUS DEVICE, WITH PORT
Anesthesia: General

## 2023-02-18 MED ORDER — LIDOCAINE HCL (PF) 2 % IJ SOLN
INTRAMUSCULAR | Status: DC | PRN
  Administered 2023-02-18: 40 mg via INTRADERMAL

## 2023-02-18 MED ORDER — SODIUM CHLORIDE FLUSH 0.9 % IV SOLN
INTRAVENOUS | Status: AC
  Filled 2023-02-18: qty 10

## 2023-02-18 MED ORDER — SODIUM CHLORIDE (PF) 0.9 % IJ SOLN
INTRAMUSCULAR | Status: AC
  Filled 2023-02-18: qty 50

## 2023-02-18 MED ORDER — CEFAZOLIN SODIUM-DEXTROSE 2-4 GM/100ML-% IV SOLN
2.0000 g | INTRAVENOUS | Status: AC
Start: 2023-02-18 — End: 2023-02-18
  Administered 2023-02-18: 2 g via INTRAVENOUS

## 2023-02-18 MED ORDER — GABAPENTIN 300 MG PO CAPS
ORAL_CAPSULE | ORAL | Status: AC
  Filled 2023-02-18: qty 1

## 2023-02-18 MED ORDER — CHLORHEXIDINE GLUCONATE CLOTH 2 % EX PADS: 6.0000 | MEDICATED_PAD | Freq: Once | CUTANEOUS | Status: DC

## 2023-02-18 MED ORDER — ORAL CARE MOUTH RINSE: 15.0000 mL | Freq: Once | OROMUCOSAL | Status: AC

## 2023-02-18 MED ORDER — CHLORHEXIDINE GLUCONATE 0.12 % MT SOLN
15.0000 mL | Freq: Once | OROMUCOSAL | Status: AC
  Administered 2023-02-18: 15 mL via OROMUCOSAL

## 2023-02-18 MED ORDER — CHLORHEXIDINE GLUCONATE 0.12 % MT SOLN
OROMUCOSAL | Status: AC
  Filled 2023-02-18: qty 15

## 2023-02-18 MED ORDER — ONDANSETRON HCL 4 MG/2ML IJ SOLN: 4.0000 mg | Freq: Once | INTRAMUSCULAR | Status: DC | PRN

## 2023-02-18 MED ORDER — EPHEDRINE SULFATE-NACL 50-0.9 MG/10ML-% IV SOSY
PREFILLED_SYRINGE | INTRAVENOUS | Status: DC | PRN
  Administered 2023-02-18 (×2): 5 mg via INTRAVENOUS

## 2023-02-18 MED ORDER — PROPOFOL 500 MG/50ML IV EMUL
INTRAVENOUS | Status: DC | PRN
  Administered 2023-02-18: 75 ug/kg/min via INTRAVENOUS

## 2023-02-18 MED ORDER — SODIUM CHLORIDE 0.9 % IV SOLN: INTRAVENOUS | Status: DC

## 2023-02-18 MED ORDER — 0.9 % SODIUM CHLORIDE (POUR BTL) OPTIME
TOPICAL | Status: DC | PRN
  Administered 2023-02-18: 500 mL

## 2023-02-18 MED ORDER — HEPARIN SODIUM (PORCINE) 5000 UNIT/ML IJ SOLN
INTRAMUSCULAR | Status: AC
  Filled 2023-02-18: qty 2

## 2023-02-18 MED ORDER — CELECOXIB 200 MG PO CAPS
ORAL_CAPSULE | ORAL | Status: AC
  Filled 2023-02-18: qty 1

## 2023-02-18 MED ORDER — BUPIVACAINE-EPINEPHRINE (PF) 0.25% -1:200000 IJ SOLN
INTRAMUSCULAR | Status: DC | PRN
  Administered 2023-02-18: 9 mL

## 2023-02-18 MED ORDER — FENTANYL CITRATE (PF) 100 MCG/2ML IJ SOLN: 25.0000 ug | INTRAMUSCULAR | Status: DC | PRN

## 2023-02-18 MED ORDER — OXYCODONE HCL 5 MG PO TABS: 5.0000 mg | ORAL_TABLET | Freq: Once | ORAL | Status: DC | PRN

## 2023-02-18 MED ORDER — PROPOFOL 10 MG/ML IV BOLUS
INTRAVENOUS | Status: DC | PRN
  Administered 2023-02-18: 40 mg via INTRAVENOUS

## 2023-02-18 MED ORDER — BUPIVACAINE-EPINEPHRINE (PF) 0.25% -1:200000 IJ SOLN
INTRAMUSCULAR | Status: AC
  Filled 2023-02-18: qty 30

## 2023-02-18 MED ORDER — CEFAZOLIN SODIUM-DEXTROSE 2-4 GM/100ML-% IV SOLN
INTRAVENOUS | Status: AC
  Filled 2023-02-18: qty 100

## 2023-02-18 MED ORDER — OXYCODONE HCL 5 MG/5ML PO SOLN: 5.0000 mg | Freq: Once | ORAL | Status: DC | PRN

## 2023-02-18 MED ORDER — ACETAMINOPHEN 500 MG PO TABS
1000.0000 mg | ORAL_TABLET | ORAL | Status: AC
  Administered 2023-02-18: 1000 mg via ORAL

## 2023-02-18 MED ORDER — PROPOFOL 1000 MG/100ML IV EMUL
INTRAVENOUS | Status: AC
  Filled 2023-02-18: qty 100

## 2023-02-18 MED ORDER — CELECOXIB 200 MG PO CAPS
200.0000 mg | ORAL_CAPSULE | ORAL | Status: AC
  Administered 2023-02-18: 200 mg via ORAL

## 2023-02-18 MED ORDER — ACETAMINOPHEN 500 MG PO TABS
ORAL_TABLET | ORAL | Status: AC
  Filled 2023-02-18: qty 2

## 2023-02-18 MED ORDER — BUPIVACAINE LIPOSOME 1.3 % IJ SUSP: 20.0000 mL | Freq: Once | INTRAMUSCULAR | Status: DC

## 2023-02-18 MED ORDER — CHLORHEXIDINE GLUCONATE CLOTH 2 % EX PADS
6.0000 | MEDICATED_PAD | Freq: Once | CUTANEOUS | Status: AC
  Administered 2023-02-18: 6 via TOPICAL

## 2023-02-18 MED ORDER — SODIUM CHLORIDE 0.9 % IV SOLN
INTRAVENOUS | Status: DC | PRN
  Administered 2023-02-18: 10 mL via INTRAMUSCULAR

## 2023-02-18 MED ORDER — GABAPENTIN 300 MG PO CAPS
300.0000 mg | ORAL_CAPSULE | ORAL | Status: AC
  Administered 2023-02-18: 300 mg via ORAL

## 2023-02-18 SURGICAL SUPPLY — 28 items
BAG DECANTER FOR FLEXI CONT (MISCELLANEOUS) ×1 IMPLANT
BLADE SURG 15 STRL LF DISP TIS (BLADE) ×1 IMPLANT
CLAMP SUTURE YELLOW 5 PAIRS (MISCELLANEOUS) ×1 IMPLANT
COVER LIGHT HANDLE STERIS (MISCELLANEOUS) ×2 IMPLANT
DERMABOND ADVANCED .7 DNX12 (GAUZE/BANDAGES/DRESSINGS) ×1 IMPLANT
DRAPE C-ARM XRAY 36X54 (DRAPES) ×1 IMPLANT
ELECT CAUTERY BLADE 6.4 (BLADE) ×1 IMPLANT
ELECT REM PT RETURN 9FT ADLT (ELECTROSURGICAL) ×1
ELECTRODE REM PT RTRN 9FT ADLT (ELECTROSURGICAL) ×1 IMPLANT
GAUZE 4X4 16PLY ~~LOC~~+RFID DBL (SPONGE) ×1 IMPLANT
GLOVE ORTHO TXT STRL SZ7.5 (GLOVE) ×2 IMPLANT
GOWN STRL REUS W/ TWL LRG LVL3 (GOWN DISPOSABLE) ×1 IMPLANT
GOWN STRL REUS W/ TWL XL LVL3 (GOWN DISPOSABLE) ×1 IMPLANT
IV NS 500ML BAXH (IV SOLUTION) ×1 IMPLANT
KIT PORT INFUSION SMART 8FR (Port) ×1 IMPLANT
KIT TURNOVER KIT A (KITS) ×1 IMPLANT
MANIFOLD NEPTUNE II (INSTRUMENTS) ×1 IMPLANT
NDL HYPO 22X1.5 SAFETY MO (MISCELLANEOUS) ×1 IMPLANT
NEEDLE HYPO 22X1.5 SAFETY MO (MISCELLANEOUS) ×1 IMPLANT
PACK PORT-A-CATH (MISCELLANEOUS) ×1 IMPLANT
SUT MNCRL AB 4-0 PS2 18 (SUTURE) ×1 IMPLANT
SUT VIC AB 3-0 SH 27X BRD (SUTURE) ×1 IMPLANT
SYR 10ML LL (SYRINGE) ×1 IMPLANT
SYR 3ML LL SCALE MARK (SYRINGE) ×1 IMPLANT
SYR 5ML LL (SYRINGE) ×1 IMPLANT
TAG SUTURE CLAMP YLW 5PR (MISCELLANEOUS) ×1
TRAP FLUID SMOKE EVACUATOR (MISCELLANEOUS) ×1 IMPLANT
WATER STERILE IRR 500ML POUR (IV SOLUTION) ×1 IMPLANT

## 2023-02-18 NOTE — Transfer of Care (Signed)
 Immediate Anesthesia Transfer of Care Note  Patient: April [name]  Procedure(s) Performed: INSERTION PORT-A-CATH  Patient Location: PACU  Anesthesia Type:General  Level of Consciousness: drowsy and patient cooperative  Airway & Oxygen  Therapy: Patient Spontanous Breathing and Patient connected to nasal cannula oxygen   Post-op Assessment: Report given to RN and Post -op Vital signs reviewed and stable  Post vital signs: stable  Last Vitals:  Vitals Value Taken Time  BP 94/46 02/18/23 1445  Temp 36.2 C 02/18/23 1443  Pulse 63 02/18/23 1446  Resp 12 02/18/23 1445  SpO2 100 % 02/18/23 1446  Vitals shown include unfiled device data.  Last Pain:  Vitals:   02/18/23 1443  TempSrc:   PainSc: Asleep         Complications: No notable events documented.

## 2023-02-18 NOTE — Interval H&P Note (Signed)
 History and Physical Interval Note:  02/18/2023 12:47 PM  April [name]  has presented today for surgery, with the diagnosis of squamous cell carcinoma.  The various methods of treatment have been discussed with the patient and family. After consideration of risks, benefits and other options for treatment, the patient has consented to  Procedure(s): INSERTION PORT-A-CATH (N/A) as a surgical intervention.  The patient's history has been reviewed, patient examined, no change in status, stable for surgery.  I have reviewed the patient's chart and labs.  Questions were answered to the patient's satisfaction.     [name]

## 2023-02-18 NOTE — Op Note (Signed)
 SURGICAL PROCEDURE REPORT  DATE OF PROCEDURE: 02/18/2023   ATTENDING SURGEON:  [name], M.D., FACS   ANESTHESIA: Local with MAC  PRE-OPERATIVE DIAGNOSIS: Advanced/Metastatic squamous cell cancer requiring durable central venous access for chemotherapy   POST-OPERATIVE DIAGNOSIS: Same.  PROCEDURE: (cpt: 36561, 77001)  Insertion of right subclavian vein angiodynamics SmartPort central venous catheter with subcutaneous port under fluoroscopic guidance   INTRAOPERATIVE FINDINGS: Fluoroscopic confirmation of guidewire in superior vena cava, tip at fluoroscopy appears to be at caval atrial junction, excellent return and easy flush, well-secured tunneled central venous catheter with subcutaneous port at completion of the procedure   INTRAOPERATIVE FLUIDS:  0 mL contrast used   FLUOROSCOPY: as recorded.   ESTIMATED BLOOD LOSS: Minimal (<20 mL)   SPECIMENS: None   IMPLANTS: 43F tunneled  angiodynamics SmartPort central venous catheter with subcutaneous port  DRAINS: None   COMPLICATIONS: None apparent   CONDITION AT COMPLETION: Hemodynamically stable, awake   DISPOSITION: PACU   INDICATION(S) FOR PROCEDURE:  Patient is a 83 y.o. female who presented with advanced/metastatic squamous cell cancer requiring durable central venous access for chemotherapy. All risks, benefits, and alternatives to above elective procedures were discussed with the patient, who elected to proceed, and informed consent was accordingly obtained at that time.  DETAILS OF PROCEDURE:  Patient was brought to the operative suite and appropriately identified.  Right subclavian access site and planned port placement site were prepped and draped in the usual sterile fashion. Following a brief timeout, in Trendelenburg position, percutaneous right subclavian venous access was obtained using Seldinger technique, by which local anesthetic was injected over the right subclavian region, and access needle was inserted  into the SCV vein, through which soft guidewire was advanced, over which access needle was withdrawn. Length of catheter needed to position the catheter tip at the atrio-caval junction was then measured under direct fluoroscopic visualization, after which the catheter was cut to the measured length. Guidewire was secured, attention was directed to injection of local anesthetic along the planned port site, 2-3 cm transverse ipsilateral chest incision was made and confirmed to accommodate the subcutaneous port, and flushed measured catheter was attached to port, then the port was placed within the subcutaneous pocket. Insertion sheath was advanced over the guidewire, which was withdrawn along with the insertion sheath dilator.  The catheter was then advanced through the sheath into the SCV and SVC.  Port was confirmed to withdraw blood and flush easily, after which concentrated heparin  was instilled into the port and catheter. Dermis at the subcutaneous pocket was re-approximated using buried interrupted 3-0 Vicryl suture, and 4-0 Monocryl suture was used to re-approximate skin at the insertion/subcutaneous port site in running subcuticular fashion for the subcutaneous port and buried interrupted fashion for the insertion site.  Using the straight Huebner needle the port was flushed with heparin  at 1000 units/mL 4 to 5 mL was injected through the port.  Skin was cleaned, dried, and sterile skin glue was applied. Patient was then safely transferred to PACU for a chest x-ray.  I was present for all aspects of the procedures, and no intraprocedural complications were apparent.   [name], M.D., [facility] 02/18/2023 2:45 PM

## 2023-02-18 NOTE — Anesthesia Preprocedure Evaluation (Signed)
 Anesthesia Evaluation  Patient identified by MRN, date of birth, ID band Patient awake    Reviewed: Allergy & Precautions, H&P , NPO status , Patient's Chart, lab work & pertinent test results  History of Anesthesia Complications Negative for: history of anesthetic complications  Airway Mallampati: IV  TM Distance: >3 FB Neck ROM: Full  Mouth opening: Limited Mouth Opening  Dental  (+) Edentulous Upper, Edentulous Lower   Pulmonary neg pulmonary ROS, neg sleep apnea, neg COPD, Patient abstained from smoking.Not current smoker, former smoker   Pulmonary exam normal breath sounds clear to auscultation       Cardiovascular Exercise Tolerance: Good METShypertension, Pt. 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  HTN Gout Diabetes mellitus SCC of anus and rectum Osteoarthritis Mild dysphagia  Reproductive/Obstetrics negative OB ROS                             Anesthesia Physical Anesthesia Plan  ASA: 3  Anesthesia Plan: General   Post-op Pain Management: Celebrex  PO (pre-op)*, Gabapentin  PO (pre-op)* and Tylenol  PO (pre-op)*   Induction: Intravenous  PONV Risk Score and Plan: 2 and Propofol  infusion, TIVA, Ondansetron , Treatment may vary due to age or medical condition and Dexamethasone   Airway  Management Planned: Nasal Cannula  Additional Equipment: None  Intra-op Plan:   Post-operative Plan:   Informed Consent: I have reviewed the patients History and Physical, chart, labs and discussed the procedure including the risks, benefits and alternatives for the proposed anesthesia with the patient or authorized representative who has indicated his/her understanding and acceptance.     Dental advisory given  Plan Discussed with: CRNA and Surgeon  Anesthesia Plan Comments: (Discussed risks of anesthesia with patient, including possibility of difficulty with spontaneous ventilation under anesthesia necessitating airway intervention, PONV, and rare risks such as cardiac or respiratory or neurological events, and allergic reactions. Discussed the role of CRNA in patient's perioperative care. Patient understands.)        Anesthesia Quick Evaluation

## 2023-02-19 ENCOUNTER — Encounter: Payer: Self-pay | Admitting: Surgery

## 2023-02-20 NOTE — Anesthesia Postprocedure Evaluation (Signed)
 Anesthesia Post Note  Patient: April [name]  Procedure(s) Performed: INSERTION PORT-A-CATH  Patient location during evaluation: PACU Anesthesia Type: General Level of consciousness: awake and alert Pain management: pain level controlled Vital Signs Assessment: post-procedure vital signs reviewed and stable Respiratory status: spontaneous breathing, nonlabored ventilation, respiratory function stable and patient connected to nasal cannula oxygen  Cardiovascular status: blood pressure returned to baseline and stable Postop Assessment: no apparent nausea or vomiting Anesthetic complications: no   No notable events documented.   Last Vitals:  Vitals:   02/18/23 1515 02/18/23 1533  BP: (!) 138/53 105/63  Pulse: 68 73  Resp: 18 18  Temp: (!) 36.3 C 36.4 C  SpO2: 98% 96%    Last Pain:  Vitals:   02/18/23 1533  TempSrc: Oral  PainSc: 0-No pain                 Fairy POUR Abeera Flannery

## 2023-02-21 ENCOUNTER — Ambulatory Visit
Admission: RE | Admit: 2023-02-21 | Discharge: 2023-02-21 | Disposition: A | Discharge status: Home / Self Care | Payer: Medicare HMO | Source: Ambulatory Visit | Attending: Radiation Oncology | Admitting: Radiation Oncology

## 2023-02-21 ENCOUNTER — Other Ambulatory Visit: Payer: Self-pay

## 2023-02-21 DIAGNOSIS — E041 Nontoxic single thyroid nodule: Secondary | ICD-10-CM | POA: Diagnosis not present

## 2023-02-21 DIAGNOSIS — R1310 Dysphagia, unspecified: Secondary | ICD-10-CM | POA: Diagnosis not present

## 2023-02-21 DIAGNOSIS — C20 Malignant neoplasm of rectum: Secondary | ICD-10-CM | POA: Diagnosis not present

## 2023-02-21 DIAGNOSIS — Z5111 Encounter for antineoplastic chemotherapy: Secondary | ICD-10-CM | POA: Diagnosis not present

## 2023-02-21 DIAGNOSIS — C218 Malignant neoplasm of overlapping sites of rectum, anus and anal canal: Secondary | ICD-10-CM | POA: Diagnosis not present

## 2023-02-21 DIAGNOSIS — Z510 Encounter for antineoplastic radiation therapy: Secondary | ICD-10-CM | POA: Diagnosis not present

## 2023-02-21 DIAGNOSIS — E119 Type 2 diabetes mellitus without complications: Secondary | ICD-10-CM | POA: Diagnosis not present

## 2023-02-21 DIAGNOSIS — D649 Anemia, unspecified: Secondary | ICD-10-CM | POA: Diagnosis not present

## 2023-02-21 LAB — RAD ONC ARIA SESSION SUMMARY
Course Elapsed Days: 18
Plan Fractions Treated to Date: 11
Plan Prescribed Dose Per Fraction: 1.8 Gy
Plan Total Fractions Prescribed: 30
Plan Total Prescribed Dose: 54 Gy
Reference Point Dosage Given to Date: 19.8 Gy
Reference Point Session Dosage Given: 1.8 Gy
Session Number: 11

## 2023-02-21 NOTE — Progress Notes (Signed)
 Specialty Pharmacy Ongoing Clinical Assessment Note  April [name] is a 83 y.o. female who is being followed by the specialty pharmacy service for RxSp Oncology   Patient's specialty medication(s) reviewed today: Capecitabine  (XELODA )   Missed doses in the last 4 weeks: 0   Patient/Caregiver did not have any additional questions or concerns.   Therapeutic benefit summary: Unable to assess   Adverse events/side effects summary: No adverse events/side effects   Patient's therapy is appropriate to: Continue    Goals Addressed             This Visit's Progress    Stabilization of disease       Patient is on track. Patient will maintain adherence         Follow up:  No further follow-up needed at this time.   Mitzie GORMAN [facility] Specialty Pharmacist

## 2023-02-21 NOTE — Progress Notes (Signed)
 Xeloda prescription completed. Disenrolling patient from Specialty Services.

## 2023-02-22 ENCOUNTER — Ambulatory Visit
Admission: RE | Admit: 2023-02-22 | Discharge: 2023-02-22 | Disposition: A | Discharge status: Home / Self Care | Payer: Medicare HMO | Source: Ambulatory Visit | Attending: Radiation Oncology | Admitting: Radiation Oncology

## 2023-02-22 ENCOUNTER — Other Ambulatory Visit: Payer: Self-pay

## 2023-02-22 ENCOUNTER — Inpatient Hospital Stay: Payer: Medicare HMO

## 2023-02-22 DIAGNOSIS — Z510 Encounter for antineoplastic radiation therapy: Secondary | ICD-10-CM | POA: Diagnosis not present

## 2023-02-22 DIAGNOSIS — C218 Malignant neoplasm of overlapping sites of rectum, anus and anal canal: Secondary | ICD-10-CM | POA: Diagnosis not present

## 2023-02-22 DIAGNOSIS — C20 Malignant neoplasm of rectum: Secondary | ICD-10-CM | POA: Diagnosis not present

## 2023-02-22 DIAGNOSIS — Z5111 Encounter for antineoplastic chemotherapy: Secondary | ICD-10-CM | POA: Diagnosis not present

## 2023-02-22 DIAGNOSIS — R1310 Dysphagia, unspecified: Secondary | ICD-10-CM | POA: Diagnosis not present

## 2023-02-22 DIAGNOSIS — E119 Type 2 diabetes mellitus without complications: Secondary | ICD-10-CM | POA: Diagnosis not present

## 2023-02-22 DIAGNOSIS — E041 Nontoxic single thyroid nodule: Secondary | ICD-10-CM | POA: Diagnosis not present

## 2023-02-22 DIAGNOSIS — D649 Anemia, unspecified: Secondary | ICD-10-CM | POA: Diagnosis not present

## 2023-02-22 LAB — RAD ONC ARIA SESSION SUMMARY
Course Elapsed Days: 19
Plan Fractions Treated to Date: 12
Plan Prescribed Dose Per Fraction: 1.8 Gy
Plan Total Fractions Prescribed: 30
Plan Total Prescribed Dose: 54 Gy
Reference Point Dosage Given to Date: 21.6 Gy
Reference Point Session Dosage Given: 1.8 Gy
Session Number: 12

## 2023-02-22 NOTE — Progress Notes (Signed)
 Nutrition  Patient scheduled to see RD following radiation treatment today.  Patient left without seeing RD.    Called patient this afternoon but unable to reach patient. Left message with call back number.  Pasco Marchitto B. Dasie, RD, LDN Registered Dietitian [phone]

## 2023-02-23 ENCOUNTER — Other Ambulatory Visit: Payer: Self-pay

## 2023-02-23 ENCOUNTER — Inpatient Hospital Stay: Payer: Medicare HMO

## 2023-02-23 ENCOUNTER — Ambulatory Visit
Admission: RE | Admit: 2023-02-23 | Discharge: 2023-02-23 | Disposition: A | Discharge status: Home / Self Care | Payer: Medicare HMO | Source: Ambulatory Visit | Attending: Radiation Oncology | Admitting: Radiation Oncology

## 2023-02-23 DIAGNOSIS — Z510 Encounter for antineoplastic radiation therapy: Secondary | ICD-10-CM | POA: Diagnosis not present

## 2023-02-23 DIAGNOSIS — C20 Malignant neoplasm of rectum: Secondary | ICD-10-CM | POA: Diagnosis not present

## 2023-02-23 DIAGNOSIS — E119 Type 2 diabetes mellitus without complications: Secondary | ICD-10-CM | POA: Diagnosis not present

## 2023-02-23 DIAGNOSIS — R1310 Dysphagia, unspecified: Secondary | ICD-10-CM | POA: Diagnosis not present

## 2023-02-23 DIAGNOSIS — D649 Anemia, unspecified: Secondary | ICD-10-CM | POA: Diagnosis not present

## 2023-02-23 DIAGNOSIS — Z5111 Encounter for antineoplastic chemotherapy: Secondary | ICD-10-CM | POA: Diagnosis not present

## 2023-02-23 DIAGNOSIS — C218 Malignant neoplasm of overlapping sites of rectum, anus and anal canal: Secondary | ICD-10-CM | POA: Diagnosis not present

## 2023-02-23 DIAGNOSIS — E041 Nontoxic single thyroid nodule: Secondary | ICD-10-CM | POA: Diagnosis not present

## 2023-02-23 LAB — RAD ONC ARIA SESSION SUMMARY
Course Elapsed Days: 20
Plan Fractions Treated to Date: 13
Plan Prescribed Dose Per Fraction: 1.8 Gy
Plan Total Fractions Prescribed: 30
Plan Total Prescribed Dose: 54 Gy
Reference Point Dosage Given to Date: 23.4 Gy
Reference Point Session Dosage Given: 1.8 Gy
Session Number: 13

## 2023-02-23 NOTE — Progress Notes (Signed)
 Nutrition Assessment   Reason for Assessment:   Pending head and neck cancer treatment    ASSESSMENT:  83 year old female with stage IIIa SCC of anus, likely head and neck cancer and thyroid  nodule.  Past medical history of DM, HLD, HTN.  Patient receiving radiation and concurrent xeloda  and mitomycin .  Treatment of head and neck cancer to follow current treatment.  Met with patient following radiation. Reports that her appetite is good/normal.  Denies any trouble swallowing or pain in her mouth.  Yesterday ate bacon, eggs, toast for breakfast and fruit cocktail.  Lunch yesterday was fish sandwich with fries and diet coke.  Supper last night was fries and applesauce.  Has not tried oral nutrition supplements. Was having diarrhea but started taking 2 imodium (as of yesterday) and has not had any issues with diarrhea since taking pills yesterday.      Nutrition Focused Physical Exam:   Orbital Region: mild Buccal Region: mild Upper Arm Region: normal Thoracic and Lumbar Region: normal Temple Region: mild Clavicle Bone Region: mild Shoulder and Acromion Bone Region: mild Scapular Bone Region: mild Dorsal Hand: mild Patellar Region: mild Anterior Thigh Region: mild Posterior Calf Region: normal Edema (RD assessment): none    Medications: metformin , compazine , lidocaine    Labs: reviewed   Anthropometrics:   Height: 66 inches Weight: 137 lb on 1/10 144 lb 8 oz on 12/30 142 lb on 10/24  5% weight loss in the last 3 weeks  Estimated Energy Needs  Kcals: 1550-1890 Protein: 77-94.5 g Fluid: 1550-1890 ml   NUTRITION DIAGNOSIS: Unintentional weight loss related to cancer as evidenced by 5% weight loss in the last 3 weeks and diarrhea    INTERVENTION:  Encouraged protein foods at every meal.  Discussed foods to choose with diarrhea. Handout provided Recommend trying low sugar oral nutrition supplement (ie glucerna, etc) Samples of glucerna given along with  coupons. Contact information provided    MONITORING, EVALUATION, GOAL: weight trends, intake   Next Visit: Wednesday, Feb 5 phone call  Agustin Swatek B. Leighton Punches, RD, LDN Registered Dietitian [phone]

## 2023-02-24 ENCOUNTER — Other Ambulatory Visit: Payer: Self-pay

## 2023-02-24 ENCOUNTER — Ambulatory Visit
Admission: RE | Admit: 2023-02-24 | Discharge: 2023-02-24 | Disposition: A | Discharge status: Home / Self Care | Payer: Medicare HMO | Source: Ambulatory Visit | Attending: Radiation Oncology | Admitting: Radiation Oncology

## 2023-02-24 DIAGNOSIS — E041 Nontoxic single thyroid nodule: Secondary | ICD-10-CM | POA: Diagnosis not present

## 2023-02-24 DIAGNOSIS — Z5111 Encounter for antineoplastic chemotherapy: Secondary | ICD-10-CM | POA: Diagnosis not present

## 2023-02-24 DIAGNOSIS — E119 Type 2 diabetes mellitus without complications: Secondary | ICD-10-CM | POA: Diagnosis not present

## 2023-02-24 DIAGNOSIS — Z510 Encounter for antineoplastic radiation therapy: Secondary | ICD-10-CM | POA: Diagnosis not present

## 2023-02-24 DIAGNOSIS — C218 Malignant neoplasm of overlapping sites of rectum, anus and anal canal: Secondary | ICD-10-CM | POA: Diagnosis not present

## 2023-02-24 DIAGNOSIS — D649 Anemia, unspecified: Secondary | ICD-10-CM | POA: Diagnosis not present

## 2023-02-24 DIAGNOSIS — C20 Malignant neoplasm of rectum: Secondary | ICD-10-CM | POA: Diagnosis not present

## 2023-02-24 DIAGNOSIS — R1310 Dysphagia, unspecified: Secondary | ICD-10-CM | POA: Diagnosis not present

## 2023-02-24 LAB — RAD ONC ARIA SESSION SUMMARY
Course Elapsed Days: 21
Plan Fractions Treated to Date: 14
Plan Prescribed Dose Per Fraction: 1.8 Gy
Plan Total Fractions Prescribed: 30
Plan Total Prescribed Dose: 54 Gy
Reference Point Dosage Given to Date: 25.2 Gy
Reference Point Session Dosage Given: 1.8 Gy
Session Number: 14

## 2023-02-25 ENCOUNTER — Ambulatory Visit
Admission: RE | Admit: 2023-02-25 | Discharge: 2023-02-25 | Disposition: A | Discharge status: Home / Self Care | Payer: Medicare HMO | Source: Ambulatory Visit | Attending: Radiation Oncology | Admitting: Radiation Oncology

## 2023-02-25 ENCOUNTER — Other Ambulatory Visit: Payer: Self-pay

## 2023-02-25 DIAGNOSIS — D649 Anemia, unspecified: Secondary | ICD-10-CM | POA: Diagnosis not present

## 2023-02-25 DIAGNOSIS — R1310 Dysphagia, unspecified: Secondary | ICD-10-CM | POA: Diagnosis not present

## 2023-02-25 DIAGNOSIS — C20 Malignant neoplasm of rectum: Secondary | ICD-10-CM | POA: Diagnosis not present

## 2023-02-25 DIAGNOSIS — Z510 Encounter for antineoplastic radiation therapy: Secondary | ICD-10-CM | POA: Diagnosis not present

## 2023-02-25 DIAGNOSIS — E041 Nontoxic single thyroid nodule: Secondary | ICD-10-CM | POA: Diagnosis not present

## 2023-02-25 DIAGNOSIS — C218 Malignant neoplasm of overlapping sites of rectum, anus and anal canal: Secondary | ICD-10-CM | POA: Diagnosis not present

## 2023-02-25 DIAGNOSIS — Z5111 Encounter for antineoplastic chemotherapy: Secondary | ICD-10-CM | POA: Diagnosis not present

## 2023-02-25 DIAGNOSIS — E119 Type 2 diabetes mellitus without complications: Secondary | ICD-10-CM | POA: Diagnosis not present

## 2023-02-25 LAB — RAD ONC ARIA SESSION SUMMARY
Course Elapsed Days: 22
Plan Fractions Treated to Date: 15
Plan Prescribed Dose Per Fraction: 1.8 Gy
Plan Total Fractions Prescribed: 30
Plan Total Prescribed Dose: 54 Gy
Reference Point Dosage Given to Date: 27 Gy
Reference Point Session Dosage Given: 1.8 Gy
Session Number: 15

## 2023-02-28 ENCOUNTER — Other Ambulatory Visit: Payer: Self-pay

## 2023-02-28 ENCOUNTER — Encounter: Payer: Self-pay | Admitting: Surgery

## 2023-02-28 ENCOUNTER — Encounter: Payer: Self-pay | Admitting: Oncology

## 2023-02-28 ENCOUNTER — Ambulatory Visit
Admission: RE | Admit: 2023-02-28 | Discharge: 2023-02-28 | Disposition: A | Discharge status: Home / Self Care | Payer: Medicare HMO | Source: Ambulatory Visit | Attending: Radiation Oncology | Admitting: Radiation Oncology

## 2023-02-28 DIAGNOSIS — Z510 Encounter for antineoplastic radiation therapy: Secondary | ICD-10-CM | POA: Diagnosis not present

## 2023-02-28 DIAGNOSIS — R1310 Dysphagia, unspecified: Secondary | ICD-10-CM | POA: Diagnosis not present

## 2023-02-28 DIAGNOSIS — E119 Type 2 diabetes mellitus without complications: Secondary | ICD-10-CM | POA: Diagnosis not present

## 2023-02-28 DIAGNOSIS — C20 Malignant neoplasm of rectum: Secondary | ICD-10-CM | POA: Diagnosis not present

## 2023-02-28 DIAGNOSIS — C218 Malignant neoplasm of overlapping sites of rectum, anus and anal canal: Secondary | ICD-10-CM | POA: Diagnosis not present

## 2023-02-28 DIAGNOSIS — Z5111 Encounter for antineoplastic chemotherapy: Secondary | ICD-10-CM | POA: Diagnosis not present

## 2023-02-28 DIAGNOSIS — D649 Anemia, unspecified: Secondary | ICD-10-CM | POA: Diagnosis not present

## 2023-02-28 DIAGNOSIS — E041 Nontoxic single thyroid nodule: Secondary | ICD-10-CM | POA: Diagnosis not present

## 2023-02-28 LAB — RAD ONC ARIA SESSION SUMMARY
Course Elapsed Days: 25
Plan Fractions Treated to Date: 16
Plan Prescribed Dose Per Fraction: 1.8 Gy
Plan Total Fractions Prescribed: 30
Plan Total Prescribed Dose: 54 Gy
Reference Point Dosage Given to Date: 28.8 Gy
Reference Point Session Dosage Given: 1.8 Gy
Session Number: 16

## 2023-03-01 ENCOUNTER — Ambulatory Visit
Admission: RE | Admit: 2023-03-01 | Discharge: 2023-03-01 | Disposition: A | Discharge status: Home / Self Care | Payer: Medicare HMO | Source: Ambulatory Visit | Attending: Radiation Oncology | Admitting: Radiation Oncology

## 2023-03-01 ENCOUNTER — Other Ambulatory Visit: Payer: Self-pay

## 2023-03-01 ENCOUNTER — Other Ambulatory Visit: Payer: Self-pay | Admitting: *Deleted

## 2023-03-01 DIAGNOSIS — R1310 Dysphagia, unspecified: Secondary | ICD-10-CM | POA: Diagnosis not present

## 2023-03-01 DIAGNOSIS — Z510 Encounter for antineoplastic radiation therapy: Secondary | ICD-10-CM | POA: Diagnosis not present

## 2023-03-01 DIAGNOSIS — D649 Anemia, unspecified: Secondary | ICD-10-CM | POA: Diagnosis not present

## 2023-03-01 DIAGNOSIS — C218 Malignant neoplasm of overlapping sites of rectum, anus and anal canal: Secondary | ICD-10-CM | POA: Diagnosis not present

## 2023-03-01 DIAGNOSIS — E119 Type 2 diabetes mellitus without complications: Secondary | ICD-10-CM | POA: Diagnosis not present

## 2023-03-01 DIAGNOSIS — E041 Nontoxic single thyroid nodule: Secondary | ICD-10-CM | POA: Diagnosis not present

## 2023-03-01 DIAGNOSIS — Z5111 Encounter for antineoplastic chemotherapy: Secondary | ICD-10-CM | POA: Diagnosis not present

## 2023-03-01 DIAGNOSIS — C20 Malignant neoplasm of rectum: Secondary | ICD-10-CM | POA: Diagnosis not present

## 2023-03-01 LAB — RAD ONC ARIA SESSION SUMMARY
Course Elapsed Days: 26
Plan Fractions Treated to Date: 17
Plan Prescribed Dose Per Fraction: 1.8 Gy
Plan Total Fractions Prescribed: 30
Plan Total Prescribed Dose: 54 Gy
Reference Point Dosage Given to Date: 30.6 Gy
Reference Point Session Dosage Given: 1.8 Gy
Session Number: 17

## 2023-03-01 MED ORDER — AZITHROMYCIN 250 MG PO TABS: ORAL_TABLET | ORAL | 0 refills | Status: DC

## 2023-03-02 ENCOUNTER — Ambulatory Visit: Payer: Medicare HMO

## 2023-03-02 ENCOUNTER — Ambulatory Visit: Payer: Medicare PPO | Admitting: Speech Pathology

## 2023-03-03 ENCOUNTER — Encounter: Payer: Medicare PPO | Admitting: Surgery

## 2023-03-03 ENCOUNTER — Other Ambulatory Visit: Payer: Self-pay

## 2023-03-03 ENCOUNTER — Ambulatory Visit: Payer: Medicare HMO

## 2023-03-03 ENCOUNTER — Ambulatory Visit
Admission: RE | Admit: 2023-03-03 | Discharge: 2023-03-03 | Disposition: A | Discharge status: Home / Self Care | Payer: Medicare HMO | Source: Ambulatory Visit | Attending: Radiation Oncology | Admitting: Radiation Oncology

## 2023-03-03 DIAGNOSIS — Z5111 Encounter for antineoplastic chemotherapy: Secondary | ICD-10-CM | POA: Diagnosis not present

## 2023-03-03 DIAGNOSIS — R1310 Dysphagia, unspecified: Secondary | ICD-10-CM | POA: Diagnosis not present

## 2023-03-03 DIAGNOSIS — E119 Type 2 diabetes mellitus without complications: Secondary | ICD-10-CM | POA: Diagnosis not present

## 2023-03-03 DIAGNOSIS — E041 Nontoxic single thyroid nodule: Secondary | ICD-10-CM | POA: Diagnosis not present

## 2023-03-03 DIAGNOSIS — Z510 Encounter for antineoplastic radiation therapy: Secondary | ICD-10-CM | POA: Diagnosis not present

## 2023-03-03 DIAGNOSIS — D649 Anemia, unspecified: Secondary | ICD-10-CM | POA: Diagnosis not present

## 2023-03-03 DIAGNOSIS — C218 Malignant neoplasm of overlapping sites of rectum, anus and anal canal: Secondary | ICD-10-CM | POA: Diagnosis not present

## 2023-03-03 DIAGNOSIS — C20 Malignant neoplasm of rectum: Secondary | ICD-10-CM | POA: Diagnosis not present

## 2023-03-03 LAB — RAD ONC ARIA SESSION SUMMARY
Course Elapsed Days: 28
Plan Fractions Treated to Date: 18
Plan Prescribed Dose Per Fraction: 1.8 Gy
Plan Total Fractions Prescribed: 30
Plan Total Prescribed Dose: 54 Gy
Reference Point Dosage Given to Date: 32.4 Gy
Reference Point Session Dosage Given: 1.8 Gy
Session Number: 18

## 2023-03-03 NOTE — Progress Notes (Deleted)
 [facility] POST-OP OFFICE VISIT  03/03/2023  HPI: April [name] is a 83 y.o. 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   Vital signs: There were no vitals taken for this visit.   Physical Exam: Constitutional: *** Abdomen: *** Skin: ***  Assessment/Plan: This is a 83 y.o. 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

## 2023-03-04 ENCOUNTER — Ambulatory Visit
Admission: RE | Admit: 2023-03-04 | Discharge: 2023-03-04 | Disposition: A | Discharge status: Home / Self Care | Payer: Medicare HMO | Source: Ambulatory Visit | Attending: Radiation Oncology | Admitting: Radiation Oncology

## 2023-03-04 ENCOUNTER — Other Ambulatory Visit: Payer: Self-pay

## 2023-03-04 DIAGNOSIS — R1310 Dysphagia, unspecified: Secondary | ICD-10-CM | POA: Diagnosis not present

## 2023-03-04 DIAGNOSIS — C20 Malignant neoplasm of rectum: Secondary | ICD-10-CM | POA: Diagnosis not present

## 2023-03-04 DIAGNOSIS — Z510 Encounter for antineoplastic radiation therapy: Secondary | ICD-10-CM | POA: Diagnosis not present

## 2023-03-04 DIAGNOSIS — E041 Nontoxic single thyroid nodule: Secondary | ICD-10-CM | POA: Diagnosis not present

## 2023-03-04 DIAGNOSIS — D649 Anemia, unspecified: Secondary | ICD-10-CM | POA: Diagnosis not present

## 2023-03-04 DIAGNOSIS — C218 Malignant neoplasm of overlapping sites of rectum, anus and anal canal: Secondary | ICD-10-CM | POA: Diagnosis not present

## 2023-03-04 DIAGNOSIS — E119 Type 2 diabetes mellitus without complications: Secondary | ICD-10-CM | POA: Diagnosis not present

## 2023-03-04 DIAGNOSIS — Z5111 Encounter for antineoplastic chemotherapy: Secondary | ICD-10-CM | POA: Diagnosis not present

## 2023-03-04 LAB — RAD ONC ARIA SESSION SUMMARY
Course Elapsed Days: 29
Plan Fractions Treated to Date: 19
Plan Prescribed Dose Per Fraction: 1.8 Gy
Plan Total Fractions Prescribed: 30
Plan Total Prescribed Dose: 54 Gy
Reference Point Dosage Given to Date: 34.2 Gy
Reference Point Session Dosage Given: 1.8 Gy
Session Number: 19

## 2023-03-07 ENCOUNTER — Inpatient Hospital Stay: Payer: Medicare HMO

## 2023-03-07 ENCOUNTER — Inpatient Hospital Stay (HOSPITAL_BASED_OUTPATIENT_CLINIC_OR_DEPARTMENT_OTHER): Payer: Medicare HMO | Admitting: Oncology

## 2023-03-07 ENCOUNTER — Ambulatory Visit
Admission: RE | Admit: 2023-03-07 | Discharge: 2023-03-07 | Disposition: A | Discharge status: Home / Self Care | Payer: Medicare HMO | Source: Ambulatory Visit | Attending: Radiation Oncology | Admitting: Radiation Oncology

## 2023-03-07 ENCOUNTER — Encounter: Payer: Self-pay | Admitting: Oncology

## 2023-03-07 ENCOUNTER — Other Ambulatory Visit: Payer: Self-pay

## 2023-03-07 VITALS — BP 154/60 | HR 73 | Resp 18 | Ht 66.0 in | Wt 132.0 lb

## 2023-03-07 DIAGNOSIS — C210 Malignant neoplasm of anus, unspecified: Secondary | ICD-10-CM

## 2023-03-07 DIAGNOSIS — Z510 Encounter for antineoplastic radiation therapy: Secondary | ICD-10-CM | POA: Diagnosis not present

## 2023-03-07 DIAGNOSIS — E119 Type 2 diabetes mellitus without complications: Secondary | ICD-10-CM | POA: Diagnosis not present

## 2023-03-07 DIAGNOSIS — E041 Nontoxic single thyroid nodule: Secondary | ICD-10-CM | POA: Diagnosis not present

## 2023-03-07 DIAGNOSIS — C20 Malignant neoplasm of rectum: Secondary | ICD-10-CM | POA: Diagnosis not present

## 2023-03-07 DIAGNOSIS — C218 Malignant neoplasm of overlapping sites of rectum, anus and anal canal: Secondary | ICD-10-CM | POA: Diagnosis not present

## 2023-03-07 DIAGNOSIS — D649 Anemia, unspecified: Secondary | ICD-10-CM | POA: Diagnosis not present

## 2023-03-07 DIAGNOSIS — Z5111 Encounter for antineoplastic chemotherapy: Secondary | ICD-10-CM | POA: Diagnosis not present

## 2023-03-07 DIAGNOSIS — R1310 Dysphagia, unspecified: Secondary | ICD-10-CM | POA: Diagnosis not present

## 2023-03-07 LAB — RAD ONC ARIA SESSION SUMMARY
Course Elapsed Days: 32
Plan Fractions Treated to Date: 20
Plan Prescribed Dose Per Fraction: 1.8 Gy
Plan Total Fractions Prescribed: 30
Plan Total Prescribed Dose: 54 Gy
Reference Point Dosage Given to Date: 36 Gy
Reference Point Session Dosage Given: 1.8 Gy
Session Number: 20

## 2023-03-07 LAB — CBC WITH DIFFERENTIAL (CANCER CENTER ONLY)
Abs Immature Granulocytes: 0.02 10*3/uL (ref 0.00–0.07)
Basophils Absolute: 0 10*3/uL (ref 0.0–0.1)
Basophils Relative: 0 %
Eosinophils Absolute: 0.1 10*3/uL (ref 0.0–0.5)
Eosinophils Relative: 3 %
HCT: 25.6 % — ABNORMAL LOW (ref 36.0–46.0)
Hemoglobin: 7.9 g/dL — ABNORMAL LOW (ref 12.0–15.0)
Immature Granulocytes: 1 %
Lymphocytes Relative: 18 %
Lymphs Abs: 0.5 10*3/uL — ABNORMAL LOW (ref 0.7–4.0)
MCH: 27.3 pg (ref 26.0–34.0)
MCHC: 30.9 g/dL (ref 30.0–36.0)
MCV: 88.6 fL (ref 80.0–100.0)
Monocytes Absolute: 0.3 10*3/uL (ref 0.1–1.0)
Monocytes Relative: 9 %
Neutro Abs: 2.1 10*3/uL (ref 1.7–7.7)
Neutrophils Relative %: 69 %
Platelet Count: 175 10*3/uL (ref 150–400)
RBC: 2.89 MIL/uL — ABNORMAL LOW (ref 3.87–5.11)
RDW: 16.1 % — ABNORMAL HIGH (ref 11.5–15.5)
WBC Count: 3 10*3/uL — ABNORMAL LOW (ref 4.0–10.5)
nRBC: 0 % (ref 0.0–0.2)

## 2023-03-07 LAB — SAMPLE TO BLOOD BANK

## 2023-03-07 LAB — CMP (CANCER CENTER ONLY)
ALT: 8 U/L (ref 0–44)
AST: 18 U/L (ref 15–41)
Albumin: 3.9 g/dL (ref 3.5–5.0)
Alkaline Phosphatase: 40 U/L (ref 38–126)
Anion gap: 10 (ref 5–15)
BUN: 16 mg/dL (ref 8–23)
CO2: 22 mmol/L (ref 22–32)
Calcium: 9.2 mg/dL (ref 8.9–10.3)
Chloride: 107 mmol/L (ref 98–111)
Creatinine: 1.04 mg/dL — ABNORMAL HIGH (ref 0.44–1.00)
GFR, Estimated: 53 mL/min — ABNORMAL LOW (ref >60)
Glucose, Bld: 103 mg/dL — ABNORMAL HIGH (ref 70–99)
Potassium: 4.1 mmol/L (ref 3.5–5.1)
Sodium: 139 mmol/L (ref 135–145)
Total Bilirubin: 0.9 mg/dL (ref 0.0–1.2)
Total Protein: 7.3 g/dL (ref 6.5–8.1)

## 2023-03-07 LAB — MAGNESIUM: Magnesium: 1.5 mg/dL — ABNORMAL LOW (ref 1.7–2.4)

## 2023-03-07 MED ORDER — HEPARIN SOD (PORK) LOCK FLUSH 100 UNIT/ML IV SOLN
500.0000 [IU] | Freq: Once | INTRAVENOUS | Status: AC | PRN
  Administered 2023-03-07: 500 [IU]
  Filled 2023-03-07: qty 5

## 2023-03-07 MED ORDER — SODIUM CHLORIDE 0.9 % IV SOLN
INTRAVENOUS | Status: DC
  Filled 2023-03-07: qty 250

## 2023-03-07 MED ORDER — PROCHLORPERAZINE MALEATE 10 MG PO TABS
10.0000 mg | ORAL_TABLET | Freq: Once | ORAL | Status: AC
  Administered 2023-03-07: 10 mg via ORAL
  Filled 2023-03-07: qty 1

## 2023-03-07 MED ORDER — MITOMYCIN CHEMO IV INJECTION 20 MG
10.0000 mg/m2 | Freq: Once | INTRAVENOUS | Status: AC
  Administered 2023-03-07: 17 mg via INTRAVENOUS
  Filled 2023-03-07: qty 34

## 2023-03-07 MED ORDER — MAGNESIUM SULFATE 2 GM/50ML IV SOLN
2.0000 g | Freq: Once | INTRAVENOUS | Status: AC
  Administered 2023-03-07: 2 g via INTRAVENOUS

## 2023-03-07 NOTE — Progress Notes (Signed)
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  INTERVAL HISTORY: Patient returns to clinic today for repeat laboratory work, further evaluation, and her second dose of IV mitomycin.  She recently had port placement.  She continues with daily XRT and Xeloda and is tolerating treatments well.  She had 1 episode of diarrhea this morning.  She continues to have a mild dysphagia. She does not complain of pain today.  She has no neurologic complaints.  She denies any recent fevers or illnesses.  She has a good appetite and denies weight loss.  She has no chest pain, shortness of breath, cough, or hemoptysis.  She denies any nausea, vomiting, or constipation.  She has no melena or hematochezia.  She has no urinary complaints.  Patient offers no further specific complaints today.  REVIEW OF SYSTEMS:   Review of Systems  Constitutional: Negative.  Negative for fever, malaise/fatigue and weight loss.  HENT:  Negative for sore throat.   Respiratory: Negative.  Negative for cough, hemoptysis and shortness of breath.   Cardiovascular: Negative.  Negative for chest pain and leg swelling.  Gastrointestinal:  Positive for diarrhea. Negative for abdominal pain, blood in stool and melena.  Genitourinary: Negative.  Negative for dysuria.  Musculoskeletal: Negative.  Negative for back pain.  Skin: Negative.  Negative for rash.  Neurological: Negative.  Negative for dizziness, focal  weakness, weakness and headaches.  Psychiatric/Behavioral: Negative.  The patient is not nervous/anxious.    As per HPI. Otherwise, a complete review of systems is negative.  PAST MEDICAL HISTORY: Past Medical History:  Diagnosis Date   Anemia    Diabetes mellitus without complication (HCC)    Gout    Hyperlipidemia    Hypertension    Wears dentures    full upper and lower    PAST SURGICAL HISTORY: Past Surgical History:  Procedure Laterality Date   APPENDECTOMY     gallstones  06/12/2009   MICROLARYNGOSCOPY Right 01/28/2023   Procedure: MICRODIRECT LARYNGOSCOPY WITH BIOPSY OF OROPHARYNGEAL MASS;  Surgeon: Linus Salmons, MD;  Location: [facility];  Service: ENT;  Laterality: Right;  Diabetic   PORTACATH PLACEMENT N/A 02/18/2023   Procedure: INSERTION PORT-A-CATH;  Surgeon: Campbell Lerner, MD;  Location: [facility];  Service: General;  Laterality: N/A;   TONSILLECTOMY      FAMILY HISTORY: Family History  Problem Relation Age of Onset   Breast cancer Neg Hx     ADVANCED DIRECTIVES (Y/N):  N  HEALTH MAINTENANCE: Social History   Tobacco Use   Smoking status: Former    Current packs/day: 0.00    Average packs/day: 0.5 packs/day for 8.0 years (4.0 ttl pk-yrs)    Types: Cigarettes    Start date: 18    Quit date: 1990    Years since quitting: 35.0    Passive exposure: Past   Smokeless tobacco: Never  Vaping Use   Vaping status: Never Used  Substance Use Topics   Alcohol use: No    Alcohol/week: 0.0 standard drinks of alcohol  Drug use: No     Colonoscopy:  PAP:  Bone density:  Lipid panel:  No Known Allergies  Current Outpatient Medications  Medication Sig Dispense Refill   Accu-Chek Softclix Lancets lancets      amLODipine (NORVASC) 10 MG tablet Take 10 mg by mouth daily.     aspirin 81 MG tablet Take 1 tablet by mouth daily.     azithromycin (ZITHROMAX Z-PAK) 250 MG tablet Follow package directions 6 each 0   capecitabine (XELODA) 500 MG  tablet Take 2 tablets (1,000 mg total) by mouth 2 (two) times daily after a meal. Take Monday-Friday. Take only on days of radiation. 120 tablet 0   carvedilol (COREG) 3.125 MG tablet Take 1 tablet by mouth 2 (two) times daily. cardio     colchicine 0.6 MG tablet TAKE 1 TABLET(0.6 MG) BY MOUTH DAILY (Patient taking differently: Take 0.6 mg by mouth daily as needed. TAKE 1 TABLET(0.6 MG) BY MOUTH DAILY) 10 tablet 1   diclofenac Sodium (VOLTAREN) 1 % GEL Research Patient: Apply 0.5 g (1 fingertip) to each hand and each foot twice daily for the duration of radiation therapy 200 g 0   glucose blood (ACCU-CHEK AVIVA PLUS) test strip USE TWICE DAILY AS DIRECTED     lidocaine (XYLOCAINE) 2 % solution Use as directed 15 mLs in the mouth or throat every 6 (six) hours as needed for mouth pain. 100 mL 0   lisinopril-hydrochlorothiazide (ZESTORETIC) 20-25 MG tablet Take 1 tablet by mouth daily.     metFORMIN (GLUCOPHAGE) 500 MG tablet TAKE 1 TABLET TWICE DAILY (Patient taking differently: 500 mg. TAKE 1 TABLETin morning and 2 at night/ Dr Gershon Crane) 180 tablet 1   prochlorperazine (COMPAZINE) 10 MG tablet Take 1 tablet (10 mg total) by mouth every 6 (six) hours as needed for nausea or vomiting. 30 tablet 1   simvastatin (ZOCOR) 20 MG tablet Take 1 tablet (20 mg total) by mouth daily. 90 tablet 1   tiZANidine (ZANAFLEX) 4 MG tablet Take 1 tablet (4 mg total) by mouth at bedtime. (Patient taking differently: Take 2 mg by mouth at bedtime.) 90 tablet 1   traMADol (ULTRAM) 50 MG tablet Take 1 tablet (50 mg total) by mouth every 12 (twelve) hours as needed. 30 tablet 0   Current Facility-Administered Medications  Medication Dose Route Frequency Provider Last Rate Last Admin   albuterol (PROVENTIL) (2.5 MG/3ML) 0.083% nebulizer solution 2.5 mg  2.5 mg Nebulization Once [name], MD       Facility-Administered Medications Ordered in Other Visits  Medication Dose Route Frequency Provider Last Rate Last Admin    0.9 %  sodium chloride infusion   Intravenous Continuous Jeralyn Ruths, MD 10 mL/hr at 03/07/23 1141 New Bag at 03/07/23 1141   heparin lock flush 100 unit/mL  500 Units Intracatheter Once PRN Jeralyn Ruths, MD       magnesium sulfate IVPB 2 g 50 mL  2 g Intravenous Once Jeralyn Ruths, MD 50 mL/hr at 03/07/23 1142 2 g at 03/07/23 1142   mitoMYcin (MUTAMYCIN) chemo injection 17 mg  10 mg/m2 (Treatment Plan Recorded) Intravenous Once Jeralyn Ruths, MD        OBJECTIVE: Vitals:   03/07/23 1047  BP: (!) 154/60  Pulse: 73  Resp: 18  SpO2: 100%      Body mass index is 21.31 kg/m.    ECOG FS:0 - Asymptomatic  General: Well-developed, well-nourished, no acute distress. Eyes: Pink conjunctiva, anicteric sclera.  HEENT: Normocephalic, moist mucous membranes. Lungs: No audible wheezing or coughing. Heart: Regular rate and rhythm. Abdomen: Soft, nontender, no obvious distention. Musculoskeletal: No edema, cyanosis, or clubbing. Neuro: Alert, answering all questions appropriately. Cranial nerves grossly intact. Skin: No rashes or petechiae noted. Psych: Normal affect.  LAB RESULTS:  Lab Results  Component Value Date   NA 139 03/07/2023   K 4.1 03/07/2023   CL 107 03/07/2023   CO2 22 03/07/2023   GLUCOSE 103 (H) 03/07/2023   BUN 16 03/07/2023   CREATININE 1.04 (H) 03/07/2023   CALCIUM 9.2 03/07/2023   PROT 7.3 03/07/2023   ALBUMIN 3.9 03/07/2023   AST 18 03/07/2023   ALT 8 03/07/2023   ALKPHOS 40 03/07/2023   BILITOT 0.9 03/07/2023   GFRNONAA 53 (L) 03/07/2023   GFRAA 59 (L) 09/24/2019    Lab Results  Component Value Date   WBC 3.0 (L) 03/07/2023   NEUTROABS 2.1 03/07/2023   HGB 7.9 (L) 03/07/2023   HCT 25.6 (L) 03/07/2023   MCV 88.6 03/07/2023   PLT 175 03/07/2023     STUDIES: DG Chest Port 1 View Result Date: 02/18/2023 CLINICAL DATA:  Post port a catheter placement EXAM: PORTABLE CHEST 1 VIEW COMPARISON:  12/12/2022 FINDINGS: Unchanged  cardiac silhouette and mediastinal contours given reduced lung volumes. Right subclavian vein approach port a catheter tip projects over the inferior SVC. No pneumothorax. Mild elevation/eventration of the right hemidiaphragm. No focal airspace opacities. No pleural effusion. Vascular calcifications overlie the lung apices bilaterally. No evidence of edema. No acute osseous abnormalities. IMPRESSION: Right subclavian vein approach port a catheter tip projects over the inferior SVC. No pneumothorax. Electronically Signed   By: Simonne Come M.D.   On: 02/18/2023 15:54   DG C-Arm 1-60 Min-No Report Result Date: 02/18/2023 Fluoroscopy was utilized by the requesting physician.  No radiographic interpretation.    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  Agree with radiation oncology assessment that lesion is likely greater than 5 cm increasing stage IIIa.  Patient will be benefit from daily XRT along with concurrent chemotherapy using IV mitomycin on days 1 and 29 along with oral Xeloda on the days of her radiation.  Continue XRT and Xeloda completing treatment on March 21, 2023.  Proceed with patient's second infusion of IV mitomycin today.  Return to clinic at the end of her radiation for repeat laboratory, further evaluation, and treatment planning for her head and neck cancer.   Head and neck cancer: PET scan results as above.  Biopsy confirmed squamous cell carcinoma.  Will hold treatment until completion of treatment for anal cancer.  Patient has now had port placement. Thyroid nodule with hypermetabolism: Defer ultrasound and biopsy until completion of treatment of 2 malignancies  above. Anemia: Hemoglobin trended down to 7.9.  Return to clinic on Wednesday for 1 unit packed red blood cells. Leukopenia: Mild, monitor. Hypomagnesia: Patient will receive 2 g IV magnesium with treatment today.   Renal insufficiency: Essentially resolved.  Monitor. Dysphagia: Monitor.   Patient expressed understanding and was in agreement with this plan. She also understands that She can call clinic at any time with any questions, concerns, or complaints.  Cancer Staging  Anal squamous cell carcinoma (HCC) Staging form: Anus, AJCC V9 - Clinical stage from 01/18/2023: Stage IIIA (cT3, cN0, cM0) - Signed by Jeralyn Ruths, MD on 01/18/2023 Stage prefix: Initial diagnosis   Jeralyn Ruths, MD   03/07/2023 12:26 PM

## 2023-03-07 NOTE — Patient Instructions (Signed)
[facility] - A DEPT OF [facility]  Discharge Instructions: Thank you for choosing [facility] to provide your oncology and hematology care.  If you have a lab appointment with the Cancer Center, please go directly to the Cancer Center and check in at the registration area.  Wear comfortable clothing and clothing appropriate for easy access to any Portacath or PICC line.   We strive to give you quality time with your provider. You may need to reschedule your appointment if you arrive late (15 or more minutes).  Arriving late affects you and other patients whose appointments are after yours.  Also, if you miss three or more appointments without notifying the office, you may be dismissed from the clinic at the provider's discretion.      For prescription refill requests, have your pharmacy contact our office and allow 72 hours for refills to be completed.    Today you received the following chemotherapy and/or immunotherapy agents MAGNESIUM & MITOMYCIN      To help prevent nausea and vomiting after your treatment, we encourage you to take your nausea medication as directed.  BELOW ARE SYMPTOMS THAT SHOULD BE REPORTED IMMEDIATELY: *FEVER GREATER THAN 100.4 F (38 C) OR HIGHER *CHILLS OR SWEATING *NAUSEA AND VOMITING THAT IS NOT CONTROLLED WITH YOUR NAUSEA MEDICATION *UNUSUAL SHORTNESS OF BREATH *UNUSUAL BRUISING OR BLEEDING *URINARY PROBLEMS (pain or burning when urinating, or frequent urination) *BOWEL PROBLEMS (unusual diarrhea, constipation, pain near the anus) TENDERNESS IN MOUTH AND THROAT WITH OR WITHOUT PRESENCE OF ULCERS (sore throat, sores in mouth, or a toothache) UNUSUAL RASH, SWELLING OR PAIN  UNUSUAL VAGINAL DISCHARGE OR ITCHING   Items with * indicate a potential emergency and should be followed up as soon as possible or go to the Emergency Department if any problems should occur.  Please show the CHEMOTHERAPY ALERT CARD or  IMMUNOTHERAPY ALERT CARD at check-in to the Emergency Department and triage nurse.  Should you have questions after your visit or need to cancel or reschedule your appointment, please contact [facility] - A DEPT OF [facility] [place]  [phone] and follow the prompts.  Office hours are 8:00 a.m. to 4:30 p.m. Monday - Friday. Please note that voicemails left after 4:00 p.m. may not be returned until the following business day.  We are closed weekends and major holidays. You have access to a nurse at all times for urgent questions. Please call the main number to the clinic [phone] and follow the prompts.  For any non-urgent questions, you may also contact your provider using MyChart. We now offer e-Visits for anyone 45 and older to request care online for non-urgent symptoms. For details visit [email or web address].   Also download the MyChart app! Go to the app store, search "MyChart", open the app, select [place], and log in with your MyChart username and password.   Magnesium Sulfate Injection What is this medication? MAGNESIUM SULFATE (mag NEE zee um SUL fate) prevents and treats low levels of magnesium in your body. It may also be used to prevent and treat seizures during pregnancy in people with high blood pressure disorders, such as preeclampsia or eclampsia. Magnesium plays an important role in maintaining the health of your muscles and nervous system. This medicine may be used for other purposes; ask your health care provider or pharmacist if you have questions. What should I tell my care team before I take this medication?  They need to know if you have any of these conditions: Heart disease History of irregular heart beat Kidney disease An unusual or allergic reaction to magnesium sulfate, medications, foods, dyes, or preservatives Pregnant or trying to get pregnant Breast-feeding How should I use this medication? This medication is for infusion  into a vein. It is given in a hospital or clinic setting. Talk to your care team about the use of this medication in children. While this medication may be prescribed for selected conditions, precautions do apply. Overdosage: If you think you have taken too much of this medicine contact a poison control center or emergency room at once. NOTE: This medicine is only for you. Do not share this medicine with others. What if I miss a dose? This does not apply. What may interact with this medication? Certain medications for anxiety or sleep Certain medications for seizures, such phenobarbital Digoxin Medications that relax muscles for surgery Narcotic medications for pain This list may not describe all possible interactions. Give your health care provider a list of all the medicines, herbs, non-prescription drugs, or dietary supplements you use. Also tell them if you smoke, drink alcohol, or use illegal drugs. Some items may interact with your medicine. What should I watch for while using this medication? Your condition will be monitored carefully while you are receiving this medication. You may need blood work done while you are receiving this medication. What side effects may I notice from receiving this medication? Side effects that you should report to your care team as soon as possible: Allergic reactions--skin rash, itching, hives, swelling of the face, lips, tongue, or throat High magnesium level--confusion, drowsiness, facial flushing, redness, sweating, muscle weakness, fast or irregular heartbeat, trouble breathing Low blood pressure--dizziness, feeling faint or lightheaded, blurry vision Side effects that usually do not require medical attention (report to your care team if they continue or are bothersome): Headache Nausea This list may not describe all possible side effects. Call your doctor for medical advice about side effects. You may report side effects to FDA at  1-800-FDA-1088. Where should I keep my medication? This medication is given in a hospital or clinic and will not be stored at home. NOTE: This sheet is a summary. It may not cover all possible information. If you have questions about this medicine, talk to your doctor, pharmacist, or health care provider.  2024 Elsevier/Gold Standard (2020-10-08 00:00:00)  Mitomycin Injection What is this medication? MITOMYCIN (mye toe MYE sin) treats stomach cancer and pancreatic cancer. It works by slowing down the growth of cancer cells. This medicine may be used for other purposes; ask your health care provider or pharmacist if you have questions. COMMON BRAND NAME(S): Mutamycin What should I tell my care team before I take this medication? They need to know if you have any of these conditions: Bleeding disorders Infection, such as chickenpox, cold sores, herpes Low blood counts, such as low white cells, platelets, red blood cells Kidney disease An unusual or allergic reaction to mitomycin, other medications, foods, dyes, or preservatives Pregnant or trying to get pregnant Breastfeeding How should I use this medication? This medication is injected into a vein. It is given by your care team in a hospital or clinic setting. Talk to your care team about the use of this medication in children. Special care may be needed. Overdosage: If you think you have taken too much of this medicine contact a poison control center or emergency room at once. NOTE: This medicine is only  for you. Do not share this medicine with others. What if I miss a dose? Keep appointments for follow-up doses. It is important not to miss your dose. Call your care team if you are unable to keep an appointment. What may interact with this medication? Interactions are not expected. This list may not describe all possible interactions. Give your health care provider a list of all the medicines, herbs, non-prescription drugs, or dietary  supplements you use. Also tell them if you smoke, drink alcohol, or use illegal drugs. Some items may interact with your medicine. What should I watch for while using this medication? Your condition will be monitored carefully while you are receiving this medication. You may need blood work while taking this medication. This medication may make you feel generally unwell. This is not uncommon as chemotherapy can affect healthy cells as well as cancer cells. Report any side effects. Continue your course of treatment even though you feel ill unless your care team tells you to stop. This medication may increase your risk of getting an infection. Call your care team for advice if you get a fever, chills, sore throat, or other symptoms of a cold or flu. Do not treat yourself. Try to avoid being around people who are sick. Avoid taking medications that contain aspirin, acetaminophen, ibuprofen, naproxen, or ketoprofen unless instructed by your care team. These medications may hide a fever. This medication may increase your risk to bruise or bleed. Call your care team if you notice any unusual bleeding. Be careful brushing or flossing your teeth or using a toothpick because you may get an infection or bleed more easily. If you have any dental work done, tell your dentist you are receiving this medication. Talk to your care team if you may be pregnant. Serious birth defects can occur if you take this medication during pregnancy. Contraception is recommended while taking this medication. Your care team can help you find the option that works for you. Do not breastfeed while taking this medication. What side effects may I notice from receiving this medication? Side effects that you should report to your care team as soon as possible: Allergic reactions--skin rash, itching, hives, swelling of the face, lips, tongue, or throat Dry cough, shortness of breath or trouble breathing Infection--fever, chills, cough, sore  throat, wounds that don't heal, pain or trouble when passing urine, general feeling of discomfort or being unwell Kidney injury--decrease in the amount of urine, swelling of the ankles, hands, or feet Low red blood cell level--unusual weakness or fatigue, dizziness, headache, trouble breathing Stomach pain, bloody diarrhea, pale skin, unusual weakness or fatigue, decrease in the amount of urine, which may be signs of hemolytic uremic syndrome Unusual bruising or bleeding Side effects that usually do not require medical attention (report these to your care team if they continue or are bothersome): Diarrhea Hair loss Loss of appetite with weight loss Nausea Pain, redness, or swelling with sores inside the mouth or throat This list may not describe all possible side effects. Call your doctor for medical advice about side effects. You may report side effects to FDA at 1-800-FDA-1088. Where should I keep my medication? This medication is given in a hospital or clinic. It will not be stored at home. NOTE: This sheet is a summary. It may not cover all possible information. If you have questions about this medicine, talk to your doctor, pharmacist, or health care provider.  2024 Elsevier/Gold Standard (2021-06-18 00:00:00)

## 2023-03-08 ENCOUNTER — Other Ambulatory Visit: Payer: Self-pay

## 2023-03-08 ENCOUNTER — Ambulatory Visit
Admission: RE | Admit: 2023-03-08 | Discharge: 2023-03-08 | Disposition: A | Discharge status: Home / Self Care | Payer: Medicare HMO | Source: Ambulatory Visit | Attending: Radiation Oncology | Admitting: Radiation Oncology

## 2023-03-08 ENCOUNTER — Encounter: Payer: Self-pay | Admitting: Oncology

## 2023-03-08 ENCOUNTER — Telehealth: Payer: Self-pay | Admitting: *Deleted

## 2023-03-08 DIAGNOSIS — Z510 Encounter for antineoplastic radiation therapy: Secondary | ICD-10-CM | POA: Diagnosis not present

## 2023-03-08 DIAGNOSIS — E119 Type 2 diabetes mellitus without complications: Secondary | ICD-10-CM | POA: Diagnosis not present

## 2023-03-08 DIAGNOSIS — E041 Nontoxic single thyroid nodule: Secondary | ICD-10-CM | POA: Diagnosis not present

## 2023-03-08 DIAGNOSIS — Z5111 Encounter for antineoplastic chemotherapy: Secondary | ICD-10-CM | POA: Diagnosis not present

## 2023-03-08 DIAGNOSIS — R1310 Dysphagia, unspecified: Secondary | ICD-10-CM | POA: Diagnosis not present

## 2023-03-08 DIAGNOSIS — C218 Malignant neoplasm of overlapping sites of rectum, anus and anal canal: Secondary | ICD-10-CM | POA: Diagnosis not present

## 2023-03-08 DIAGNOSIS — C20 Malignant neoplasm of rectum: Secondary | ICD-10-CM | POA: Diagnosis not present

## 2023-03-08 DIAGNOSIS — D649 Anemia, unspecified: Secondary | ICD-10-CM | POA: Diagnosis not present

## 2023-03-08 LAB — RAD ONC ARIA SESSION SUMMARY
Course Elapsed Days: 33
Plan Fractions Treated to Date: 21
Plan Prescribed Dose Per Fraction: 1.8 Gy
Plan Total Fractions Prescribed: 30
Plan Total Prescribed Dose: 54 Gy
Reference Point Dosage Given to Date: 37.8 Gy
Reference Point Session Dosage Given: 1.8 Gy
Session Number: 21

## 2023-03-08 NOTE — Telephone Encounter (Signed)
Son called to ask if she should go back to work. She wants to go back to work.I spoke to [place] that she can go back to school,she needs a mask at all times around kids, and make sure she feels ok to go

## 2023-03-09 ENCOUNTER — Ambulatory Visit
Admission: RE | Admit: 2023-03-09 | Discharge: 2023-03-09 | Disposition: A | Discharge status: Home / Self Care | Payer: Medicare HMO | Source: Ambulatory Visit | Attending: Radiation Oncology | Admitting: Radiation Oncology

## 2023-03-09 ENCOUNTER — Inpatient Hospital Stay: Payer: Medicare HMO

## 2023-03-09 ENCOUNTER — Other Ambulatory Visit: Payer: Self-pay

## 2023-03-09 DIAGNOSIS — Z5111 Encounter for antineoplastic chemotherapy: Secondary | ICD-10-CM | POA: Diagnosis not present

## 2023-03-09 DIAGNOSIS — R1310 Dysphagia, unspecified: Secondary | ICD-10-CM | POA: Diagnosis not present

## 2023-03-09 DIAGNOSIS — C20 Malignant neoplasm of rectum: Secondary | ICD-10-CM | POA: Diagnosis not present

## 2023-03-09 DIAGNOSIS — D649 Anemia, unspecified: Secondary | ICD-10-CM | POA: Diagnosis not present

## 2023-03-09 DIAGNOSIS — E041 Nontoxic single thyroid nodule: Secondary | ICD-10-CM | POA: Diagnosis not present

## 2023-03-09 DIAGNOSIS — C218 Malignant neoplasm of overlapping sites of rectum, anus and anal canal: Secondary | ICD-10-CM | POA: Diagnosis not present

## 2023-03-09 DIAGNOSIS — C210 Malignant neoplasm of anus, unspecified: Secondary | ICD-10-CM

## 2023-03-09 DIAGNOSIS — E119 Type 2 diabetes mellitus without complications: Secondary | ICD-10-CM | POA: Diagnosis not present

## 2023-03-09 DIAGNOSIS — Z510 Encounter for antineoplastic radiation therapy: Secondary | ICD-10-CM | POA: Diagnosis not present

## 2023-03-09 LAB — RAD ONC ARIA SESSION SUMMARY
Course Elapsed Days: 34
Plan Fractions Treated to Date: 22
Plan Prescribed Dose Per Fraction: 1.8 Gy
Plan Total Fractions Prescribed: 30
Plan Total Prescribed Dose: 54 Gy
Reference Point Dosage Given to Date: 39.6 Gy
Reference Point Session Dosage Given: 1.8 Gy
Session Number: 22

## 2023-03-09 MED ORDER — HEPARIN SOD (PORK) LOCK FLUSH 100 UNIT/ML IV SOLN
500.0000 [IU] | Freq: Every day | INTRAVENOUS | Status: AC | PRN
  Administered 2023-03-09: 500 [IU]
  Filled 2023-03-09: qty 5

## 2023-03-09 MED ORDER — SODIUM CHLORIDE 0.9% IV SOLUTION
250.0000 mL | INTRAVENOUS | Status: DC
  Administered 2023-03-09: 100 mL via INTRAVENOUS
  Filled 2023-03-09: qty 250

## 2023-03-09 MED ORDER — ACETAMINOPHEN 325 MG PO TABS
650.0000 mg | ORAL_TABLET | Freq: Once | ORAL | Status: AC
  Administered 2023-03-09: 650 mg via ORAL
  Filled 2023-03-09: qty 2

## 2023-03-09 MED ORDER — DIPHENHYDRAMINE HCL 50 MG/ML IJ SOLN
25.0000 mg | Freq: Once | INTRAMUSCULAR | Status: AC
  Administered 2023-03-09: 25 mg via INTRAVENOUS
  Filled 2023-03-09: qty 1

## 2023-03-09 NOTE — Patient Instructions (Signed)
Blood Transfusion, Adult A blood transfusion is a procedure in which you receive blood through an IV tube. You may need this procedure because of: A bleeding disorder. An illness. An injury. A surgery. The blood may come from someone else (a donor). You may also be able to donate blood for yourself before a surgery. The blood given in a transfusion may be made up of different types of cells. You may get: Red blood cells. These carry oxygen to the cells in the body. Platelets. These help your blood to clot. Plasma. This is the liquid part of your blood. It carries proteins and other substances through the body. White blood cells. These help you fight infections. If you have a clotting disorder, you may also get other types of blood products. Depending on the type of blood product, this procedure may take 1-4 hours to complete. Tell your doctor about: Any bleeding problems you have. Any reactions you have had during a blood transfusion in the past. Any allergies you have. All medicines you are taking, including vitamins, herbs, eye drops, creams, and over-the-counter medicines. Any surgeries you have had. Any medical conditions you have. Whether you are pregnant or may be pregnant. What are the risks? Talk with your health care provider about risks. The most common problems include: A mild allergic reaction. This includes red, swollen areas of skin (hives) and itching. Fever or chills. This may be the body's response to new blood cells received. This may happen during or up to 4 hours after the transfusion. More serious problems may include: A serious allergic reaction. This includes breathing trouble or swelling around the face and lips. Too much fluid in the lungs. This may cause breathing problems. Lung injury. This causes breathing trouble and low oxygen in the blood. This can happen within hours of the transfusion or days later. Too much iron. This can happen after getting many blood  transfusions over a period of time. An infection or virus passed through the blood. This is rare. Donated blood is carefully tested before it is given. Your body's defense system (immune system) trying to attack the new blood cells. This is rare. Symptoms may include fever, chills, nausea, low blood pressure, and low back or chest pain. Donated cells attacking healthy tissues. This is rare. What happens before the procedure? You will have a blood test to find out your blood type. The test also finds out what type of blood your body will accept and matches it to the donor type. If you are going to have a planned surgery, you may be able to donate your own blood. This may be done in case you need a transfusion. You will have your temperature, blood pressure, and pulse checked. You may receive medicine to help prevent an allergic reaction. This may be done if you have had a reaction to a transfusion before. This medicine may be given to you by mouth or through an IV tube. What happens during the procedure?  An IV tube will be put into one of your veins. The bag of blood will be attached to your IV tube. Then, the blood will enter through your vein. Your temperature, blood pressure, and pulse will be checked often. This is done to find early signs of a transfusion reaction. Tell your nurse right away if you have any of these symptoms: Shortness of breath or trouble breathing. Chest or back pain. Fever or chills. Red, swollen areas of skin or itching. If you have any signs  or symptoms of a reaction, your transfusion will be stopped. You may also be given medicine. When the transfusion is finished, your IV tube will be taken out. Pressure may be put on the IV site for a few minutes. A bandage (dressing) will be put on the IV site. The procedure may vary among doctors and hospitals. What happens after the procedure? You will be monitored until you leave the hospital or clinic. This includes  checking your temperature, blood pressure, pulse, breathing rate, and blood oxygen level. Your blood may be tested to see how you have responded to the transfusion. You may be warmed with fluids or blankets. This is done to keep the temperature of your body normal. If you have your procedure in an outpatient setting, you will be told whom to contact to report any reactions. Where to find more information Visit the [employer]: redcross.org Summary A blood transfusion is a procedure in which you receive blood through an IV tube. The blood you are given may be made up of different blood cells. You may receive red blood cells, platelets, plasma, or white blood cells. Your temperature, blood pressure, and pulse will be checked often. After the procedure, your blood may be tested to see how you have responded. This information is not intended to replace advice given to you by your health care provider. Make sure you discuss any questions you have with your health care provider. Document Revised: 04/24/2021 Document Reviewed: 04/24/2021 Elsevier Patient Education  2024 [employer].

## 2023-03-10 ENCOUNTER — Ambulatory Visit
Admission: RE | Admit: 2023-03-10 | Discharge: 2023-03-10 | Disposition: A | Discharge status: Home / Self Care | Payer: Medicare HMO | Source: Ambulatory Visit | Attending: Radiation Oncology | Admitting: Radiation Oncology

## 2023-03-10 ENCOUNTER — Other Ambulatory Visit: Payer: Self-pay

## 2023-03-10 DIAGNOSIS — E041 Nontoxic single thyroid nodule: Secondary | ICD-10-CM | POA: Diagnosis not present

## 2023-03-10 DIAGNOSIS — C218 Malignant neoplasm of overlapping sites of rectum, anus and anal canal: Secondary | ICD-10-CM | POA: Diagnosis not present

## 2023-03-10 DIAGNOSIS — Z5111 Encounter for antineoplastic chemotherapy: Secondary | ICD-10-CM | POA: Diagnosis not present

## 2023-03-10 DIAGNOSIS — C20 Malignant neoplasm of rectum: Secondary | ICD-10-CM | POA: Diagnosis not present

## 2023-03-10 DIAGNOSIS — E119 Type 2 diabetes mellitus without complications: Secondary | ICD-10-CM | POA: Diagnosis not present

## 2023-03-10 DIAGNOSIS — D649 Anemia, unspecified: Secondary | ICD-10-CM | POA: Diagnosis not present

## 2023-03-10 DIAGNOSIS — R1310 Dysphagia, unspecified: Secondary | ICD-10-CM | POA: Diagnosis not present

## 2023-03-10 DIAGNOSIS — Z510 Encounter for antineoplastic radiation therapy: Secondary | ICD-10-CM | POA: Diagnosis not present

## 2023-03-10 LAB — RAD ONC ARIA SESSION SUMMARY
Course Elapsed Days: 35
Plan Fractions Treated to Date: 23
Plan Prescribed Dose Per Fraction: 1.8 Gy
Plan Total Fractions Prescribed: 30
Plan Total Prescribed Dose: 54 Gy
Reference Point Dosage Given to Date: 41.4 Gy
Reference Point Session Dosage Given: 1.8 Gy
Session Number: 23

## 2023-03-10 LAB — BPAM RBC
Blood Product Expiration Date: 202502272359
ISSUE DATE / TIME: 202501291043
Unit Type and Rh: 202502272359
Unit Type and Rh: 6200

## 2023-03-10 LAB — TYPE AND SCREEN
ABO/RH(D): A POS
Antibody Screen: NEGATIVE
Unit division: 0

## 2023-03-11 ENCOUNTER — Ambulatory Visit
Admission: RE | Admit: 2023-03-11 | Discharge: 2023-03-11 | Disposition: A | Discharge status: Home / Self Care | Payer: Medicare HMO | Source: Ambulatory Visit | Attending: Radiation Oncology | Admitting: Radiation Oncology

## 2023-03-11 ENCOUNTER — Ambulatory Visit: Payer: Medicare PPO | Admitting: Speech Pathology

## 2023-03-11 ENCOUNTER — Other Ambulatory Visit: Payer: Self-pay

## 2023-03-11 DIAGNOSIS — D649 Anemia, unspecified: Secondary | ICD-10-CM | POA: Diagnosis not present

## 2023-03-11 DIAGNOSIS — E041 Nontoxic single thyroid nodule: Secondary | ICD-10-CM | POA: Diagnosis not present

## 2023-03-11 DIAGNOSIS — C20 Malignant neoplasm of rectum: Secondary | ICD-10-CM | POA: Diagnosis not present

## 2023-03-11 DIAGNOSIS — Z510 Encounter for antineoplastic radiation therapy: Secondary | ICD-10-CM | POA: Diagnosis not present

## 2023-03-11 DIAGNOSIS — R1310 Dysphagia, unspecified: Secondary | ICD-10-CM | POA: Diagnosis not present

## 2023-03-11 DIAGNOSIS — C218 Malignant neoplasm of overlapping sites of rectum, anus and anal canal: Secondary | ICD-10-CM | POA: Diagnosis not present

## 2023-03-11 DIAGNOSIS — Z5111 Encounter for antineoplastic chemotherapy: Secondary | ICD-10-CM | POA: Diagnosis not present

## 2023-03-11 DIAGNOSIS — E119 Type 2 diabetes mellitus without complications: Secondary | ICD-10-CM | POA: Diagnosis not present

## 2023-03-11 LAB — RAD ONC ARIA SESSION SUMMARY
Course Elapsed Days: 36
Plan Fractions Treated to Date: 24
Plan Prescribed Dose Per Fraction: 1.8 Gy
Plan Total Fractions Prescribed: 30
Plan Total Prescribed Dose: 54 Gy
Reference Point Dosage Given to Date: 43.2 Gy
Reference Point Session Dosage Given: 1.8 Gy
Session Number: 24

## 2023-03-11 LAB — PREPARE RBC (CROSSMATCH)

## 2023-03-14 ENCOUNTER — Other Ambulatory Visit: Payer: Self-pay | Admitting: *Deleted

## 2023-03-14 ENCOUNTER — Other Ambulatory Visit: Payer: Self-pay

## 2023-03-14 ENCOUNTER — Ambulatory Visit
Admission: RE | Admit: 2023-03-14 | Discharge: 2023-03-14 | Disposition: A | Discharge status: Home / Self Care | Payer: Medicare HMO | Source: Ambulatory Visit | Attending: Radiation Oncology | Admitting: Radiation Oncology

## 2023-03-14 DIAGNOSIS — I119 Hypertensive heart disease without heart failure: Secondary | ICD-10-CM | POA: Diagnosis not present

## 2023-03-14 DIAGNOSIS — N289 Disorder of kidney and ureter, unspecified: Secondary | ICD-10-CM | POA: Diagnosis not present

## 2023-03-14 DIAGNOSIS — C20 Malignant neoplasm of rectum: Secondary | ICD-10-CM | POA: Insufficient documentation

## 2023-03-14 DIAGNOSIS — Z791 Long term (current) use of non-steroidal anti-inflammatories (NSAID): Secondary | ICD-10-CM | POA: Insufficient documentation

## 2023-03-14 DIAGNOSIS — E119 Type 2 diabetes mellitus without complications: Secondary | ICD-10-CM | POA: Insufficient documentation

## 2023-03-14 DIAGNOSIS — Z510 Encounter for antineoplastic radiation therapy: Secondary | ICD-10-CM | POA: Diagnosis present

## 2023-03-14 DIAGNOSIS — R197 Diarrhea, unspecified: Secondary | ICD-10-CM | POA: Diagnosis not present

## 2023-03-14 DIAGNOSIS — M109 Gout, unspecified: Secondary | ICD-10-CM | POA: Insufficient documentation

## 2023-03-14 DIAGNOSIS — Z79899 Other long term (current) drug therapy: Secondary | ICD-10-CM | POA: Diagnosis not present

## 2023-03-14 DIAGNOSIS — D72819 Decreased white blood cell count, unspecified: Secondary | ICD-10-CM | POA: Insufficient documentation

## 2023-03-14 DIAGNOSIS — D649 Anemia, unspecified: Secondary | ICD-10-CM | POA: Insufficient documentation

## 2023-03-14 DIAGNOSIS — C210 Malignant neoplasm of anus, unspecified: Secondary | ICD-10-CM | POA: Insufficient documentation

## 2023-03-14 DIAGNOSIS — E785 Hyperlipidemia, unspecified: Secondary | ICD-10-CM | POA: Diagnosis not present

## 2023-03-14 DIAGNOSIS — E041 Nontoxic single thyroid nodule: Secondary | ICD-10-CM | POA: Diagnosis not present

## 2023-03-14 DIAGNOSIS — Z87891 Personal history of nicotine dependence: Secondary | ICD-10-CM | POA: Diagnosis not present

## 2023-03-14 DIAGNOSIS — E042 Nontoxic multinodular goiter: Secondary | ICD-10-CM | POA: Diagnosis not present

## 2023-03-14 DIAGNOSIS — I700 Atherosclerosis of aorta: Secondary | ICD-10-CM | POA: Diagnosis not present

## 2023-03-14 DIAGNOSIS — Z7984 Long term (current) use of oral hypoglycemic drugs: Secondary | ICD-10-CM | POA: Diagnosis not present

## 2023-03-14 DIAGNOSIS — R1310 Dysphagia, unspecified: Secondary | ICD-10-CM | POA: Insufficient documentation

## 2023-03-14 DIAGNOSIS — Z923 Personal history of irradiation: Secondary | ICD-10-CM | POA: Insufficient documentation

## 2023-03-14 DIAGNOSIS — Z7982 Long term (current) use of aspirin: Secondary | ICD-10-CM | POA: Diagnosis not present

## 2023-03-14 LAB — RAD ONC ARIA SESSION SUMMARY
Course Elapsed Days: 39
Plan Fractions Treated to Date: 25
Plan Prescribed Dose Per Fraction: 1.8 Gy
Plan Total Fractions Prescribed: 30
Plan Total Prescribed Dose: 54 Gy
Reference Point Dosage Given to Date: 45 Gy
Reference Point Session Dosage Given: 1.8 Gy
Session Number: 25

## 2023-03-14 MED ORDER — SILVER SULFADIAZINE 1 % EX CREA: 1.0000 | TOPICAL_CREAM | Freq: Two times a day (BID) | CUTANEOUS | 2 refills | Status: DC

## 2023-03-15 ENCOUNTER — Ambulatory Visit: Payer: Medicare HMO

## 2023-03-16 ENCOUNTER — Other Ambulatory Visit: Payer: Self-pay | Admitting: *Deleted

## 2023-03-16 ENCOUNTER — Ambulatory Visit: Payer: Medicare HMO

## 2023-03-16 ENCOUNTER — Inpatient Hospital Stay: Payer: Medicare HMO

## 2023-03-16 DIAGNOSIS — I10 Essential (primary) hypertension: Secondary | ICD-10-CM | POA: Insufficient documentation

## 2023-03-16 DIAGNOSIS — E119 Type 2 diabetes mellitus without complications: Secondary | ICD-10-CM | POA: Insufficient documentation

## 2023-03-16 DIAGNOSIS — Z87891 Personal history of nicotine dependence: Secondary | ICD-10-CM | POA: Insufficient documentation

## 2023-03-16 DIAGNOSIS — Z7982 Long term (current) use of aspirin: Secondary | ICD-10-CM | POA: Insufficient documentation

## 2023-03-16 DIAGNOSIS — C210 Malignant neoplasm of anus, unspecified: Secondary | ICD-10-CM | POA: Insufficient documentation

## 2023-03-16 DIAGNOSIS — E785 Hyperlipidemia, unspecified: Secondary | ICD-10-CM | POA: Insufficient documentation

## 2023-03-16 DIAGNOSIS — Z791 Long term (current) use of non-steroidal anti-inflammatories (NSAID): Secondary | ICD-10-CM | POA: Insufficient documentation

## 2023-03-16 DIAGNOSIS — D72819 Decreased white blood cell count, unspecified: Secondary | ICD-10-CM | POA: Insufficient documentation

## 2023-03-16 DIAGNOSIS — Z923 Personal history of irradiation: Secondary | ICD-10-CM | POA: Insufficient documentation

## 2023-03-16 DIAGNOSIS — D649 Anemia, unspecified: Secondary | ICD-10-CM | POA: Insufficient documentation

## 2023-03-16 DIAGNOSIS — R1310 Dysphagia, unspecified: Secondary | ICD-10-CM | POA: Insufficient documentation

## 2023-03-16 DIAGNOSIS — Z79899 Other long term (current) drug therapy: Secondary | ICD-10-CM | POA: Insufficient documentation

## 2023-03-16 DIAGNOSIS — N289 Disorder of kidney and ureter, unspecified: Secondary | ICD-10-CM | POA: Insufficient documentation

## 2023-03-16 DIAGNOSIS — Z7984 Long term (current) use of oral hypoglycemic drugs: Secondary | ICD-10-CM | POA: Insufficient documentation

## 2023-03-16 DIAGNOSIS — M109 Gout, unspecified: Secondary | ICD-10-CM | POA: Insufficient documentation

## 2023-03-16 DIAGNOSIS — E041 Nontoxic single thyroid nodule: Secondary | ICD-10-CM | POA: Insufficient documentation

## 2023-03-16 MED ORDER — DIPHENOXYLATE-ATROPINE 2.5-0.025 MG PO TABS: 1.0000 | ORAL_TABLET | Freq: Four times a day (QID) | ORAL | 1 refills | Status: DC | PRN

## 2023-03-16 NOTE — Progress Notes (Signed)
 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  Patient receiving radiation (on hold this week) and concurrent xeloda  and mitomycin .  Treatment of head and neck cancer to follow current treatment.  Met with patient in clinic.  Had diarrhea at the cancer center just prior to meeting with patient.  Says that everything she eats is running right through her.  Taking imodium sporadically.  Reports 3 loose stool yesterday.  Said that Dr Lenn prescribed a cream for her bottom but it is not helping much.  Reports that she is eating.  Ate 2 cinnamon rolls this am and drank coffee.  Drinking ensure/boost shakes every other day.      Medications: reviewed  Labs: reviewed  Anthropometrics:   Weight 129 lb 2 oz today 137 lb on 1/10 144 lb 8 oz on 12/30 142 lb on 10/24   NUTRITION DIAGNOSIS: Unintentional weight loss continues.     INTERVENTION:  Encouraged oral nutrition supplement daily at least (glucerna). Message sent to Dr [name] regarding diarrhea and he sent in prescription for Lomotil .  Informed patient of this new medication and she will pick up at drug store.   Discussed foods to choose with diarrhea.  Handout provided    MONITORING, EVALUATION, GOAL: weight trends, intake   NEXT VISIT: Wednesday, Feb 26 phone call  Marqus Macphee B. Dasie, RD, LDN Registered Dietitian [phone]

## 2023-03-17 ENCOUNTER — Ambulatory Visit: Payer: Medicare HMO

## 2023-03-17 ENCOUNTER — Encounter: Payer: Self-pay | Admitting: Oncology

## 2023-03-18 ENCOUNTER — Ambulatory Visit: Payer: Medicare HMO

## 2023-03-21 ENCOUNTER — Ambulatory Visit
Admission: RE | Admit: 2023-03-21 | Discharge: 2023-03-21 | Disposition: A | Discharge status: Home / Self Care | Payer: Medicare HMO | Source: Ambulatory Visit | Attending: Radiation Oncology | Admitting: Radiation Oncology

## 2023-03-21 ENCOUNTER — Inpatient Hospital Stay: Payer: Medicare HMO

## 2023-03-21 ENCOUNTER — Encounter: Payer: Self-pay | Admitting: Oncology

## 2023-03-21 ENCOUNTER — Inpatient Hospital Stay (HOSPITAL_BASED_OUTPATIENT_CLINIC_OR_DEPARTMENT_OTHER): Payer: Medicare HMO | Admitting: Oncology

## 2023-03-21 ENCOUNTER — Inpatient Hospital Stay: Payer: Medicare HMO | Admitting: Oncology

## 2023-03-21 ENCOUNTER — Other Ambulatory Visit: Payer: Self-pay

## 2023-03-21 ENCOUNTER — Ambulatory Visit: Payer: Medicare HMO

## 2023-03-21 VITALS — BP 123/50 | HR 71 | Resp 18 | Wt 125.7 lb

## 2023-03-21 DIAGNOSIS — C760 Malignant neoplasm of head, face and neck: Secondary | ICD-10-CM | POA: Diagnosis not present

## 2023-03-21 DIAGNOSIS — Z95828 Presence of other vascular implants and grafts: Secondary | ICD-10-CM

## 2023-03-21 DIAGNOSIS — Z510 Encounter for antineoplastic radiation therapy: Secondary | ICD-10-CM | POA: Diagnosis not present

## 2023-03-21 DIAGNOSIS — C210 Malignant neoplasm of anus, unspecified: Secondary | ICD-10-CM

## 2023-03-21 LAB — CMP (CANCER CENTER ONLY)
ALT: 11 U/L (ref 0–44)
AST: 26 U/L (ref 15–41)
Albumin: 3.8 g/dL (ref 3.5–5.0)
Alkaline Phosphatase: 41 U/L (ref 38–126)
Anion gap: 8 (ref 5–15)
BUN: 32 mg/dL — ABNORMAL HIGH (ref 8–23)
CO2: 21 mmol/L — ABNORMAL LOW (ref 22–32)
Calcium: 9 mg/dL (ref 8.9–10.3)
Chloride: 109 mmol/L (ref 98–111)
Creatinine: 1.51 mg/dL — ABNORMAL HIGH (ref 0.44–1.00)
GFR, Estimated: 34 mL/min — ABNORMAL LOW (ref >60)
Glucose, Bld: 103 mg/dL — ABNORMAL HIGH (ref 70–99)
Potassium: 3.4 mmol/L — ABNORMAL LOW (ref 3.5–5.1)
Sodium: 138 mmol/L (ref 135–145)
Total Bilirubin: 0.6 mg/dL (ref 0.0–1.2)
Total Protein: 7.2 g/dL (ref 6.5–8.1)

## 2023-03-21 LAB — CBC WITH DIFFERENTIAL/PLATELET
Abs Immature Granulocytes: 0.05 10*3/uL (ref 0.00–0.07)
Basophils Absolute: 0 10*3/uL (ref 0.0–0.1)
Basophils Relative: 0 %
Eosinophils Absolute: 0 10*3/uL (ref 0.0–0.5)
Eosinophils Relative: 0 %
HCT: 25 % — ABNORMAL LOW (ref 36.0–46.0)
Hemoglobin: 8 g/dL — ABNORMAL LOW (ref 12.0–15.0)
Immature Granulocytes: 2 %
Lymphocytes Relative: 11 %
Lymphs Abs: 0.3 10*3/uL — ABNORMAL LOW (ref 0.7–4.0)
MCH: 28.8 pg (ref 26.0–34.0)
MCHC: 32 g/dL (ref 30.0–36.0)
MCV: 89.9 fL (ref 80.0–100.0)
Monocytes Absolute: 0.5 10*3/uL (ref 0.1–1.0)
Monocytes Relative: 19 %
Neutro Abs: 1.6 10*3/uL — ABNORMAL LOW (ref 1.7–7.7)
Neutrophils Relative %: 68 %
Platelets: 143 10*3/uL — ABNORMAL LOW (ref 150–400)
RBC: 2.78 MIL/uL — ABNORMAL LOW (ref 3.87–5.11)
RDW: 16.3 % — ABNORMAL HIGH (ref 11.5–15.5)
WBC: 2.5 10*3/uL — ABNORMAL LOW (ref 4.0–10.5)
nRBC: 0 % (ref 0.0–0.2)

## 2023-03-21 LAB — RAD ONC ARIA SESSION SUMMARY
Course Elapsed Days: 46
Plan Fractions Treated to Date: 26
Plan Prescribed Dose Per Fraction: 1.8 Gy
Plan Total Fractions Prescribed: 30
Plan Total Prescribed Dose: 54 Gy
Reference Point Dosage Given to Date: 46.8 Gy
Reference Point Session Dosage Given: 1.8 Gy
Session Number: 26

## 2023-03-21 LAB — SAMPLE TO BLOOD BANK

## 2023-03-21 LAB — MAGNESIUM: Magnesium: 1.6 mg/dL — ABNORMAL LOW (ref 1.7–2.4)

## 2023-03-21 MED ORDER — SODIUM CHLORIDE 0.9% FLUSH
10.0000 mL | Freq: Once | INTRAVENOUS | Status: AC
  Administered 2023-03-21: 10 mL via INTRAVENOUS
  Filled 2023-03-21: qty 10

## 2023-03-21 MED ORDER — HEPARIN SOD (PORK) LOCK FLUSH 100 UNIT/ML IV SOLN
500.0000 [IU] | Freq: Once | INTRAVENOUS | Status: AC
Start: 2023-03-21 — End: 2023-03-21
  Administered 2023-03-21: 500 [IU] via INTRAVENOUS
  Filled 2023-03-21: qty 5

## 2023-03-21 NOTE — Progress Notes (Signed)
 Patient reports ongoing issues with difficulty swallowing, pain.

## 2023-03-21 NOTE — Progress Notes (Signed)
 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  INTERVAL HISTORY: Patient returns to clinic today for repeat laboratory, further evaluation, and consideration of blood transfusion.  She continues with daily XRT and Xeloda  and is tolerating treatments well.  She no longer complains of diarrhea.  She denies any pain.  Her dysphagia is worse today.  She has no neurologic complaints.  She denies any recent fevers or illnesses.  She has a fair appetite and denies weight loss.  She has no chest pain, shortness of breath, cough, or hemoptysis.  She denies any nausea, vomiting, or constipation.  She has no melena or hematochezia.  She has no urinary complaints.  Patient offers no further specific complaints today.  REVIEW OF SYSTEMS:   Review of Systems  Constitutional: Negative.  Negative for fever, malaise/fatigue and weight loss.  HENT:  Positive for sore throat.   Respiratory: Negative.  Negative for cough, hemoptysis and shortness of breath.   Cardiovascular: Negative.  Negative for chest pain and leg swelling.  Gastrointestinal: Negative.  Negative for abdominal pain, blood in stool, diarrhea and melena.  Genitourinary: Negative.  Negative for dysuria.  Musculoskeletal: Negative.  Negative for back pain.  Skin: Negative.  Negative for rash.  Neurological: Negative.  Negative for dizziness, focal weakness, weakness and headaches.  Psychiatric/Behavioral: Negative.   The patient is not nervous/anxious.    As per HPI. Otherwise, a complete review of systems is negative.  PAST MEDICAL HISTORY: Past Medical History:  Diagnosis Date   Anemia    Diabetes mellitus without complication (HCC)    Gout    Hyperlipidemia    Hypertension    Wears dentures    full upper and lower    PAST SURGICAL HISTORY: Past Surgical History:  Procedure Laterality Date   APPENDECTOMY     gallstones  06/12/2009   MICROLARYNGOSCOPY Right 01/28/2023   Procedure: MICRODIRECT LARYNGOSCOPY WITH BIOPSY OF OROPHARYNGEAL MASS;  Surgeon: Lesly Raspberry, MD;  Location: [facility];  Service: ENT;  Laterality: Right;  Diabetic   PORTACATH PLACEMENT N/A 02/18/2023   Procedure: INSERTION PORT-A-CATH;  Surgeon: Flynn Hylan, MD;  Location: [facility];  Service: General;  Laterality: N/A;   TONSILLECTOMY      FAMILY HISTORY: Family History  Problem Relation Age of Onset   Breast cancer Neg Hx     ADVANCED DIRECTIVES (Y/N):  N  HEALTH MAINTENANCE: Social History   Tobacco Use   Smoking status: Former    Current packs/day: 0.00    Average packs/day: 0.5 packs/day for 8.0 years (4.0 ttl pk-yrs)    Types: Cigarettes    Start date: 42    Quit date: 1990    Years since quitting: 35.1    Passive exposure: Past   Smokeless tobacco: Never  Vaping Use   Vaping status: Never Used  Substance Use Topics   Alcohol use: No    Alcohol/week: 0.0 standard drinks of alcohol   Drug use: No     Colonoscopy:  PAP:  Bone density:  Lipid panel:  No Known Allergies  Current Outpatient Medications  Medication Sig Dispense Refill   Accu-Chek Softclix Lancets lancets      amLODipine (NORVASC) 10 MG tablet Take 10 mg by mouth daily.     aspirin 81 MG tablet Take 1 tablet by mouth daily.     azithromycin  (ZITHROMAX  Z-PAK) 250 MG tablet Follow package directions 6 each 0   capecitabine  (XELODA ) 500 MG tablet Take 2 tablets (1,000 mg total) by mouth 2 (two) times daily  after a meal. Take Monday-Friday. Take only on days of radiation. 120 tablet 0   carvedilol  (COREG ) 3.125 MG tablet Take 1 tablet by mouth 2 (two) times daily. cardio     diclofenac  Sodium (VOLTAREN ) 1 % GEL Research Patient: Apply 0.5 g (1 fingertip) to each hand and each foot twice daily for the duration of radiation therapy 200 g 0   diphenoxylate -atropine  (LOMOTIL ) 2.5-0.025 MG tablet Take 1 tablet by mouth 4 (four) times daily as needed for diarrhea or loose stools. 60 tablet 1   glucose blood (ACCU-CHEK AVIVA PLUS) test strip USE TWICE DAILY AS DIRECTED     lidocaine  (XYLOCAINE ) 2 % solution Use as directed 15 mLs in the mouth or throat every 6 (six) hours as needed for mouth pain. 100 mL 0   lisinopril -hydrochlorothiazide  (ZESTORETIC ) 20-25 MG tablet Take 1 tablet by mouth daily.     metFORMIN  (GLUCOPHAGE ) 500 MG tablet TAKE 1 TABLET TWICE DAILY (Patient taking differently: 500 mg. TAKE 1 TABLETin morning and 2 at night/ Dr Shelvy Dickens) 180 tablet 1   prochlorperazine  (COMPAZINE ) 10 MG tablet Take 1 tablet (10 mg total) by mouth every 6 (six) hours as needed for nausea or vomiting. 30 tablet 1   silver  sulfADIAZINE  (SILVADENE ) 1 % cream Apply 1 Application topically 2 (two) times daily. 50 g 2   simvastatin  (ZOCOR ) 20 MG tablet Take 1 tablet (20 mg total) by mouth daily. 90 tablet 1   tiZANidine  (ZANAFLEX ) 4 MG tablet Take 1 tablet (4 mg total) by mouth at bedtime. (Patient taking differently: Take 2 mg by mouth at bedtime.) 90 tablet 1   traMADol  (ULTRAM ) 50 MG tablet Take 1 tablet (50 mg total) by mouth every 12 (twelve) hours as needed. 30 tablet 0   colchicine  0.6 MG tablet TAKE 1 TABLET(0.6 MG) BY MOUTH DAILY (Patient not taking: Reported on 03/21/2023) 10 tablet 1   Current Facility-Administered Medications  Medication Dose Route Frequency Provider Last Rate Last Admin   albuterol  (PROVENTIL ) (2.5 MG/3ML) 0.083% nebulizer solution 2.5 mg  2.5 mg Nebulization Once Alayne Allis C, MD         OBJECTIVE: Vitals:   03/21/23 1124  BP: (!) 123/50  Pulse: 71  Resp: 18  SpO2: 100%       Body mass index is 20.29 kg/m.    ECOG FS:0 - Asymptomatic  General: Well-developed, well-nourished, no acute distress. Eyes: Pink conjunctiva, anicteric sclera. HEENT: Normocephalic, moist mucous membranes. Lungs: No audible wheezing or coughing. Heart: Regular rate and rhythm. Abdomen: Soft, nontender, no obvious distention. Musculoskeletal: No edema, cyanosis, or clubbing. Neuro: Alert, answering all questions appropriately. Cranial nerves grossly intact. Skin: No rashes or petechiae noted. Psych: Normal affect.  LAB RESULTS:  Lab Results  Component Value Date   NA 138 03/21/2023   K 3.4 (L) 03/21/2023   CL 109 03/21/2023   CO2 21 (L) 03/21/2023   GLUCOSE 103 (H) 03/21/2023   BUN 32 (H) 03/21/2023   CREATININE 1.51 (H) 03/21/2023   CALCIUM  9.0 03/21/2023   PROT 7.2 03/21/2023   ALBUMIN 3.8 03/21/2023   AST 26 03/21/2023   ALT 11 03/21/2023   ALKPHOS 41 03/21/2023   BILITOT 0.6 03/21/2023   GFRNONAA 34 (L) 03/21/2023   GFRAA 59 (L) 09/24/2019    Lab Results  Component Value Date   WBC 2.5 (L) 03/21/2023   NEUTROABS 1.6 (L) 03/21/2023   HGB 8.0 (L) 03/21/2023   HCT 25.0 (L) 03/21/2023   MCV 89.9 03/21/2023   PLT 143 (L) 03/21/2023     STUDIES: No results found.   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  Agree with radiation oncology assessment that lesion is likely greater than 5 cm increasing stage IIIa.  Patient will be benefit from daily XRT along with concurrent chemotherapy using IV mitomycin  on days 1 and 29 along with oral Xeloda  on  the days of her radiation.  Continue daily XRT and Xeloda  completing treatment on March 25, 2023.   Head and neck cancer: PET scan results as above.  Biopsy confirmed squamous cell carcinoma.  Will repeat PET scan next week for restaging purposes to further assess since patient's symptoms are becoming worse.  Return to clinic 2 to 3 days after her PET scan to discuss the results and treatment planning.  Patient has now had port placement. Thyroid  nodule with hypermetabolism: Defer ultrasound and biopsy until completion of treatment of 2 malignancies above. Anemia: Hemoglobin stable at 8.0 despite 1 unit of packed red blood cells.  Return to clinic Wednesday for transfusion. Leukopenia: Total white blood cell count has trended down to 2.5.  Monitor. Hypomagnesia: Magnesium  improved to 1.6.  Monitor. Renal insufficiency: Creatinine slightly worse today at 1.51.  1 unit of blood on Wednesday as above. Dysphagia: Getting worse.  PET scan as above for treatment planning for head neck cancer.   Patient expressed understanding and was in agreement with this plan. She also understands that She can call clinic at any time with any questions, concerns, or complaints.    Cancer Staging  Anal squamous cell carcinoma (HCC) Staging form: Anus, AJCC V9 - Clinical stage from 01/18/2023: Stage IIIA (cT3, cN0, cM0) - Signed by Shellie Dials, MD on 01/18/2023 Stage prefix: Initial diagnosis   Shellie Dials, MD   03/21/2023 12:16 PM

## 2023-03-22 ENCOUNTER — Ambulatory Visit
Admission: RE | Admit: 2023-03-22 | Discharge: 2023-03-22 | Disposition: A | Discharge status: Home / Self Care | Payer: Medicare HMO | Source: Ambulatory Visit | Attending: Radiation Oncology | Admitting: Radiation Oncology

## 2023-03-22 ENCOUNTER — Other Ambulatory Visit: Payer: Self-pay

## 2023-03-22 ENCOUNTER — Other Ambulatory Visit: Payer: Self-pay | Admitting: *Deleted

## 2023-03-22 ENCOUNTER — Ambulatory Visit: Payer: Medicare PPO

## 2023-03-22 DIAGNOSIS — Z510 Encounter for antineoplastic radiation therapy: Secondary | ICD-10-CM | POA: Diagnosis not present

## 2023-03-22 DIAGNOSIS — C210 Malignant neoplasm of anus, unspecified: Secondary | ICD-10-CM

## 2023-03-22 LAB — RAD ONC ARIA SESSION SUMMARY
Course Elapsed Days: 47
Plan Fractions Treated to Date: 27
Plan Prescribed Dose Per Fraction: 1.8 Gy
Plan Total Fractions Prescribed: 30
Plan Total Prescribed Dose: 54 Gy
Reference Point Dosage Given to Date: 48.6 Gy
Reference Point Session Dosage Given: 1.8 Gy
Session Number: 27

## 2023-03-22 LAB — PREPARE RBC (CROSSMATCH)

## 2023-03-23 ENCOUNTER — Ambulatory Visit
Admission: RE | Admit: 2023-03-23 | Discharge: 2023-03-23 | Disposition: A | Discharge status: Home / Self Care | Payer: Medicare HMO | Source: Ambulatory Visit | Attending: Radiation Oncology | Admitting: Radiation Oncology

## 2023-03-23 ENCOUNTER — Inpatient Hospital Stay: Payer: Medicare HMO

## 2023-03-23 ENCOUNTER — Other Ambulatory Visit: Payer: Self-pay

## 2023-03-23 DIAGNOSIS — C210 Malignant neoplasm of anus, unspecified: Secondary | ICD-10-CM

## 2023-03-23 DIAGNOSIS — Z510 Encounter for antineoplastic radiation therapy: Secondary | ICD-10-CM | POA: Diagnosis not present

## 2023-03-23 LAB — RAD ONC ARIA SESSION SUMMARY
Course Elapsed Days: 48
Plan Fractions Treated to Date: 28
Plan Prescribed Dose Per Fraction: 1.8 Gy
Plan Total Fractions Prescribed: 30
Plan Total Prescribed Dose: 54 Gy
Reference Point Dosage Given to Date: 50.4 Gy
Reference Point Session Dosage Given: 1.8 Gy
Session Number: 28

## 2023-03-23 MED ORDER — SODIUM CHLORIDE 0.9% IV SOLUTION
250.0000 mL | INTRAVENOUS | Status: DC
Start: 2023-03-23 — End: 2023-03-23
  Administered 2023-03-23: 250 mL via INTRAVENOUS
  Filled 2023-03-23: qty 250

## 2023-03-23 MED ORDER — DIPHENHYDRAMINE HCL 50 MG/ML IJ SOLN
25.0000 mg | Freq: Once | INTRAMUSCULAR | Status: AC
  Administered 2023-03-23: 25 mg via INTRAVENOUS
  Filled 2023-03-23: qty 1

## 2023-03-23 MED ORDER — ACETAMINOPHEN 325 MG PO TABS
650.0000 mg | ORAL_TABLET | Freq: Once | ORAL | Status: AC
  Administered 2023-03-23: 650 mg via ORAL
  Filled 2023-03-23: qty 2

## 2023-03-24 ENCOUNTER — Ambulatory Visit
Admission: RE | Admit: 2023-03-24 | Discharge: 2023-03-24 | Disposition: A | Discharge status: Home / Self Care | Payer: Medicare HMO | Source: Ambulatory Visit | Attending: Radiation Oncology | Admitting: Radiation Oncology

## 2023-03-24 ENCOUNTER — Other Ambulatory Visit: Payer: Self-pay

## 2023-03-24 DIAGNOSIS — Z510 Encounter for antineoplastic radiation therapy: Secondary | ICD-10-CM | POA: Diagnosis not present

## 2023-03-24 LAB — TYPE AND SCREEN
ABO/RH(D): A POS
Antibody Screen: NEGATIVE
Unit division: 0

## 2023-03-24 LAB — BPAM RBC
Blood Product Expiration Date: 202503162359
ISSUE DATE / TIME: 202502121102
Unit Type and Rh: 202503162359
Unit Type and Rh: 6200

## 2023-03-24 LAB — RAD ONC ARIA SESSION SUMMARY
Course Elapsed Days: 49
Plan Fractions Treated to Date: 29
Plan Prescribed Dose Per Fraction: 1.8 Gy
Plan Total Fractions Prescribed: 30
Plan Total Prescribed Dose: 54 Gy
Reference Point Dosage Given to Date: 52.2 Gy
Reference Point Session Dosage Given: 1.8 Gy
Session Number: 29

## 2023-03-25 ENCOUNTER — Other Ambulatory Visit: Payer: Self-pay

## 2023-03-25 ENCOUNTER — Ambulatory Visit
Admission: RE | Admit: 2023-03-25 | Discharge: 2023-03-25 | Disposition: A | Discharge status: Home / Self Care | Payer: Medicare HMO | Source: Ambulatory Visit | Attending: Radiation Oncology | Admitting: Radiation Oncology

## 2023-03-25 DIAGNOSIS — Z510 Encounter for antineoplastic radiation therapy: Secondary | ICD-10-CM | POA: Diagnosis not present

## 2023-03-25 LAB — RAD ONC ARIA SESSION SUMMARY
Course Elapsed Days: 50
Plan Fractions Treated to Date: 30
Plan Prescribed Dose Per Fraction: 1.8 Gy
Plan Total Fractions Prescribed: 30
Plan Total Prescribed Dose: 54 Gy
Reference Point Dosage Given to Date: 54 Gy
Reference Point Session Dosage Given: 1.8 Gy
Session Number: 30

## 2023-03-28 NOTE — Radiation Completion Notes (Signed)
 Patient Name: April [name], April [name] MRN: [id number] Date of Birth: 10-07-1940 Referring Physician: [name], M.D. Date of Service: 2023-03-28 Radiation Oncologist: Carmina Miller, M.D. [facility] [place]                             RADIATION ONCOLOGY END OF TREATMENT NOTE     Diagnosis: C20 Malignant neoplasm of rectum Staging on 2023-01-18: Anal squamous cell carcinoma (HCC) T=cT3, N=cN0, M=cM0 Intent: Curative     HPI: Patient is a 83 year old female who presented with several month history of blood per rectum.  She was seen by Dr. Yetta Barre who noted a anal mass.  Evaluated by Dr. Dolores Frame who noted an obvious ulcerated mass in the left side of the patient's anus approximate 2.5 cm in diameter.  It was fixed.  Biopsy was positive for invasive moderate differentiated squamous cell carcinoma.  PET CT scan was performed showing a constellation of findings including hypermetabolic activity in the anorectal region extending inward although difficult to measure I would assume it is greater than 5 cm.  There was no evidence of distant metastatic disease or hypermetabolic to be in pelvic lymph nodes or inguinal nodes.  She also had hypermetabolic right oropharyngeal mass with hypermetabolic right level 2 cervical nodes compatible with a primary head and neck cancer.  She also had a large thyroid with an area of hypermetabolic activity with recommending thyroid biopsy and ultrasound.  She is seen today for evaluation.  She states she is no longer bleeding.  She is having no significant anal pain at this time.  Patient states she is having no significant head and neck pain or dysphagia.      ==========DELIVERED PLANS==========  First Treatment Date: 2023-02-03 Last Treatment Date: 2023-03-25   Plan Name: Anus Site: Anus Technique: IMRT Mode: Photon Dose Per Fraction: 1.8 Gy Prescribed Dose (Delivered / Prescribed): 54 Gy / 54 Gy Prescribed Fxs (Delivered / Prescribed): 30 / 30      ==========ON TREATMENT VISIT DATES========== 2023-02-08, 2023-02-15, 2023-02-22, 2023-03-01, 2023-03-08, 2023-03-14, 2023-03-21, 2023-03-22     ==========UPCOMING VISITS==========       ==========APPENDIX - ON TREATMENT VISIT NOTES==========   See weekly On Treatment Notes in Epic for details in the Media tab (listed as Progress notes on the On Treatment Visit Dates listed above).

## 2023-03-29 ENCOUNTER — Ambulatory Visit: Admission: RE | Admit: 2023-03-29 | Payer: Medicare HMO | Source: Ambulatory Visit

## 2023-03-30 ENCOUNTER — Other Ambulatory Visit: Payer: Self-pay

## 2023-03-30 DIAGNOSIS — C210 Malignant neoplasm of anus, unspecified: Secondary | ICD-10-CM

## 2023-03-30 DIAGNOSIS — C760 Malignant neoplasm of head, face and neck: Secondary | ICD-10-CM

## 2023-03-31 ENCOUNTER — Inpatient Hospital Stay: Payer: Medicare HMO

## 2023-03-31 ENCOUNTER — Inpatient Hospital Stay: Payer: Medicare HMO | Admitting: Oncology

## 2023-04-01 ENCOUNTER — Ambulatory Visit: Payer: Medicare HMO | Admitting: Oncology

## 2023-04-01 ENCOUNTER — Other Ambulatory Visit: Payer: Medicare HMO

## 2023-04-01 ENCOUNTER — Inpatient Hospital Stay: Payer: Medicare HMO

## 2023-04-05 ENCOUNTER — Ambulatory Visit
Admission: RE | Admit: 2023-04-05 | Discharge: 2023-04-05 | Disposition: A | Discharge status: Home / Self Care | Payer: Medicare HMO | Source: Ambulatory Visit | Attending: Oncology | Admitting: Oncology

## 2023-04-05 DIAGNOSIS — C760 Malignant neoplasm of head, face and neck: Secondary | ICD-10-CM | POA: Insufficient documentation

## 2023-04-05 DIAGNOSIS — I700 Atherosclerosis of aorta: Secondary | ICD-10-CM | POA: Diagnosis not present

## 2023-04-05 DIAGNOSIS — C210 Malignant neoplasm of anus, unspecified: Secondary | ICD-10-CM | POA: Insufficient documentation

## 2023-04-05 DIAGNOSIS — R590 Localized enlarged lymph nodes: Secondary | ICD-10-CM | POA: Diagnosis not present

## 2023-04-05 DIAGNOSIS — E042 Nontoxic multinodular goiter: Secondary | ICD-10-CM | POA: Insufficient documentation

## 2023-04-05 DIAGNOSIS — C218 Malignant neoplasm of overlapping sites of rectum, anus and anal canal: Secondary | ICD-10-CM | POA: Diagnosis present

## 2023-04-05 LAB — GLUCOSE, CAPILLARY: Glucose-Capillary: 64 mg/dL — ABNORMAL LOW (ref 70–99)

## 2023-04-05 MED ORDER — FLUDEOXYGLUCOSE F - 18 (FDG) INJECTION
6.9700 | Freq: Once | INTRAVENOUS | Status: AC | PRN
  Administered 2023-04-05: 6.97 via INTRAVENOUS

## 2023-04-06 ENCOUNTER — Inpatient Hospital Stay: Payer: Medicare HMO

## 2023-04-12 ENCOUNTER — Encounter: Payer: Self-pay | Admitting: Oncology

## 2023-04-12 ENCOUNTER — Inpatient Hospital Stay: Payer: Medicare HMO

## 2023-04-12 ENCOUNTER — Inpatient Hospital Stay (HOSPITAL_BASED_OUTPATIENT_CLINIC_OR_DEPARTMENT_OTHER): Payer: Medicare HMO | Admitting: Oncology

## 2023-04-12 ENCOUNTER — Other Ambulatory Visit: Payer: Self-pay

## 2023-04-12 ENCOUNTER — Inpatient Hospital Stay: Payer: Medicare HMO | Attending: Oncology

## 2023-04-12 VITALS — BP 106/48 | HR 69 | Temp 98.6°F | Resp 16 | Ht 66.0 in | Wt 117.0 lb

## 2023-04-12 DIAGNOSIS — M109 Gout, unspecified: Secondary | ICD-10-CM | POA: Insufficient documentation

## 2023-04-12 DIAGNOSIS — Z5111 Encounter for antineoplastic chemotherapy: Secondary | ICD-10-CM | POA: Insufficient documentation

## 2023-04-12 DIAGNOSIS — E876 Hypokalemia: Secondary | ICD-10-CM | POA: Diagnosis not present

## 2023-04-12 DIAGNOSIS — Z7984 Long term (current) use of oral hypoglycemic drugs: Secondary | ICD-10-CM | POA: Insufficient documentation

## 2023-04-12 DIAGNOSIS — D72819 Decreased white blood cell count, unspecified: Secondary | ICD-10-CM | POA: Diagnosis not present

## 2023-04-12 DIAGNOSIS — N289 Disorder of kidney and ureter, unspecified: Secondary | ICD-10-CM | POA: Insufficient documentation

## 2023-04-12 DIAGNOSIS — Z9049 Acquired absence of other specified parts of digestive tract: Secondary | ICD-10-CM | POA: Insufficient documentation

## 2023-04-12 DIAGNOSIS — Z681 Body mass index (BMI) 19 or less, adult: Secondary | ICD-10-CM | POA: Diagnosis not present

## 2023-04-12 DIAGNOSIS — Z87891 Personal history of nicotine dependence: Secondary | ICD-10-CM | POA: Diagnosis not present

## 2023-04-12 DIAGNOSIS — K5730 Diverticulosis of large intestine without perforation or abscess without bleeding: Secondary | ICD-10-CM | POA: Insufficient documentation

## 2023-04-12 DIAGNOSIS — C109 Malignant neoplasm of oropharynx, unspecified: Secondary | ICD-10-CM | POA: Diagnosis not present

## 2023-04-12 DIAGNOSIS — E042 Nontoxic multinodular goiter: Secondary | ICD-10-CM | POA: Diagnosis not present

## 2023-04-12 DIAGNOSIS — D649 Anemia, unspecified: Secondary | ICD-10-CM | POA: Diagnosis not present

## 2023-04-12 DIAGNOSIS — C099 Malignant neoplasm of tonsil, unspecified: Secondary | ICD-10-CM | POA: Insufficient documentation

## 2023-04-12 DIAGNOSIS — R1310 Dysphagia, unspecified: Secondary | ICD-10-CM | POA: Insufficient documentation

## 2023-04-12 DIAGNOSIS — M542 Cervicalgia: Secondary | ICD-10-CM | POA: Insufficient documentation

## 2023-04-12 DIAGNOSIS — I700 Atherosclerosis of aorta: Secondary | ICD-10-CM | POA: Diagnosis not present

## 2023-04-12 DIAGNOSIS — D696 Thrombocytopenia, unspecified: Secondary | ICD-10-CM | POA: Insufficient documentation

## 2023-04-12 DIAGNOSIS — I10 Essential (primary) hypertension: Secondary | ICD-10-CM | POA: Insufficient documentation

## 2023-04-12 DIAGNOSIS — E119 Type 2 diabetes mellitus without complications: Secondary | ICD-10-CM | POA: Insufficient documentation

## 2023-04-12 DIAGNOSIS — E785 Hyperlipidemia, unspecified: Secondary | ICD-10-CM | POA: Diagnosis not present

## 2023-04-12 DIAGNOSIS — Z95828 Presence of other vascular implants and grafts: Secondary | ICD-10-CM

## 2023-04-12 DIAGNOSIS — Z510 Encounter for antineoplastic radiation therapy: Secondary | ICD-10-CM | POA: Diagnosis not present

## 2023-04-12 DIAGNOSIS — Z7982 Long term (current) use of aspirin: Secondary | ICD-10-CM | POA: Insufficient documentation

## 2023-04-12 DIAGNOSIS — R6884 Jaw pain: Secondary | ICD-10-CM | POA: Diagnosis not present

## 2023-04-12 DIAGNOSIS — R630 Anorexia: Secondary | ICD-10-CM | POA: Diagnosis not present

## 2023-04-12 DIAGNOSIS — Z791 Long term (current) use of non-steroidal anti-inflammatories (NSAID): Secondary | ICD-10-CM | POA: Insufficient documentation

## 2023-04-12 DIAGNOSIS — C760 Malignant neoplasm of head, face and neck: Secondary | ICD-10-CM

## 2023-04-12 DIAGNOSIS — Z79899 Other long term (current) drug therapy: Secondary | ICD-10-CM | POA: Insufficient documentation

## 2023-04-12 DIAGNOSIS — R5383 Other fatigue: Secondary | ICD-10-CM | POA: Diagnosis not present

## 2023-04-12 DIAGNOSIS — C210 Malignant neoplasm of anus, unspecified: Secondary | ICD-10-CM

## 2023-04-12 LAB — CBC WITH DIFFERENTIAL (CANCER CENTER ONLY)
Abs Immature Granulocytes: 0.01 10*3/uL (ref 0.00–0.07)
Basophils Absolute: 0 10*3/uL (ref 0.0–0.1)
Basophils Relative: 0 %
Eosinophils Absolute: 0.1 10*3/uL (ref 0.0–0.5)
Eosinophils Relative: 2 %
HCT: 26 % — ABNORMAL LOW (ref 36.0–46.0)
Hemoglobin: 8.3 g/dL — ABNORMAL LOW (ref 12.0–15.0)
Immature Granulocytes: 0 %
Lymphocytes Relative: 29 %
Lymphs Abs: 0.8 10*3/uL (ref 0.7–4.0)
MCH: 28.3 pg (ref 26.0–34.0)
MCHC: 31.9 g/dL (ref 30.0–36.0)
MCV: 88.7 fL (ref 80.0–100.0)
Monocytes Absolute: 0.3 10*3/uL (ref 0.1–1.0)
Monocytes Relative: 12 %
Neutro Abs: 1.6 10*3/uL — ABNORMAL LOW (ref 1.7–7.7)
Neutrophils Relative %: 57 %
Platelet Count: 112 10*3/uL — ABNORMAL LOW (ref 150–400)
RBC: 2.93 MIL/uL — ABNORMAL LOW (ref 3.87–5.11)
RDW: 19.6 % — ABNORMAL HIGH (ref 11.5–15.5)
WBC Count: 2.9 10*3/uL — ABNORMAL LOW (ref 4.0–10.5)
nRBC: 0 % (ref 0.0–0.2)

## 2023-04-12 LAB — CMP (CANCER CENTER ONLY)
ALT: 8 U/L (ref 0–44)
AST: 22 U/L (ref 15–41)
Albumin: 3.5 g/dL (ref 3.5–5.0)
Alkaline Phosphatase: 39 U/L (ref 38–126)
Anion gap: 8 (ref 5–15)
BUN: 31 mg/dL — ABNORMAL HIGH (ref 8–23)
CO2: 21 mmol/L — ABNORMAL LOW (ref 22–32)
Calcium: 9.1 mg/dL (ref 8.9–10.3)
Chloride: 109 mmol/L (ref 98–111)
Creatinine: 1.34 mg/dL — ABNORMAL HIGH (ref 0.44–1.00)
GFR, Estimated: 39 mL/min — ABNORMAL LOW (ref >60)
Glucose, Bld: 97 mg/dL (ref 70–99)
Potassium: 3.9 mmol/L (ref 3.5–5.1)
Sodium: 138 mmol/L (ref 135–145)
Total Bilirubin: 0.9 mg/dL (ref 0.0–1.2)
Total Protein: 6.8 g/dL (ref 6.5–8.1)

## 2023-04-12 LAB — MAGNESIUM: Magnesium: 1.7 mg/dL (ref 1.7–2.4)

## 2023-04-12 LAB — SAMPLE TO BLOOD BANK

## 2023-04-12 MED ORDER — MORPHINE SULFATE (CONCENTRATE) 10 MG /0.5 ML PO SOLN: 20.0000 mg | ORAL | 0 refills | Status: DC | PRN

## 2023-04-12 MED ORDER — SODIUM CHLORIDE 0.9% FLUSH
10.0000 mL | Freq: Once | INTRAVENOUS | Status: AC
  Administered 2023-04-12: 10 mL via INTRAVENOUS
  Filled 2023-04-12: qty 10

## 2023-04-12 MED ORDER — TRAMADOL HCL 50 MG PO TABS: 50.0000 mg | ORAL_TABLET | Freq: Two times a day (BID) | ORAL | 0 refills | Status: DC | PRN

## 2023-04-12 MED ORDER — HEPARIN SOD (PORK) LOCK FLUSH 100 UNIT/ML IV SOLN
500.0000 [IU] | Freq: Once | INTRAVENOUS | Status: AC
  Administered 2023-04-12: 500 [IU] via INTRAVENOUS
  Filled 2023-04-12: qty 5

## 2023-04-12 NOTE — Progress Notes (Signed)
 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  INTERVAL HISTORY: Patient returns to clinic today for further evaluation, discussion of her PET scan results, and treatment planning for her head and neck cancer.  She continues to have increased neck pain and difficulty swallowing.  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  She has no neurologic complaints.  She denies any recent fevers or illnesses.  She has a poor appetite.  She has no chest pain, shortness of breath, cough, or hemoptysis.  She denies any nausea, vomiting, constipation, or diarrhea.  She has no melena or hematochezia.  She has no urinary complaints.  Patient feels generally terrible, but offers no further specific complaints today.  REVIEW OF SYSTEMS:   Review of Systems  Constitutional: Negative.  Negative for fever, malaise/fatigue and weight loss.  HENT:  Positive for sore throat.   Respiratory: Negative.  Negative for cough, hemoptysis and shortness of breath.   Cardiovascular: Negative.  Negative for chest pain and leg swelling.  Gastrointestinal: Negative.  Negative for abdominal pain, blood in stool, diarrhea and melena.  Genitourinary: Negative.  Negative for dysuria.  Musculoskeletal: Negative.  Negative for back pain.  Skin: Negative.  Negative for rash.  Neurological: Negative.  Negative for dizziness, focal weakness, weakness and  headaches.  Psychiatric/Behavioral: Negative.  The patient is not nervous/anxious.    As per HPI. Otherwise, a complete review of systems is negative.  PAST MEDICAL HISTORY: Past Medical History:  Diagnosis Date   Anemia    Diabetes mellitus without complication (HCC)    Gout    Hyperlipidemia    Hypertension    Wears dentures    full upper and lower    PAST SURGICAL HISTORY: Past Surgical History:  Procedure Laterality Date   APPENDECTOMY     gallstones  06/12/2009   MICROLARYNGOSCOPY Right 01/28/2023   Procedure: MICRODIRECT LARYNGOSCOPY WITH BIOPSY OF OROPHARYNGEAL MASS;  Surgeon: Linus Salmons, MD;  Location: [facility];  Service: ENT;  Laterality: Right;  Diabetic   PORTACATH PLACEMENT N/A 02/18/2023   Procedure: INSERTION PORT-A-CATH;  Surgeon: Campbell Lerner, MD;  Location: [facility];  Service: General;  Laterality: N/A;   TONSILLECTOMY      FAMILY HISTORY: Family History  Problem Relation Age of Onset   Breast cancer Neg Hx     ADVANCED DIRECTIVES (Y/N):  N  HEALTH MAINTENANCE: Social History   Tobacco Use   Smoking status: Former    Current packs/day: 0.00    Average packs/day: 0.5 packs/day for 8.0 years (4.0 ttl pk-yrs)    Types: Cigarettes    Start date: 57    Quit date: 1990    Years since quitting: 35.1    Passive exposure: Past   Smokeless tobacco: Never  Vaping Use   Vaping status: Never Used  Substance Use Topics   Alcohol use: No    Alcohol/week: 0.0 standard drinks of alcohol   Drug use: No     Colonoscopy:  PAP:  Bone  density:  Lipid panel:  No Known Allergies  Current Outpatient Medications  Medication Sig Dispense Refill   Accu-Chek Softclix Lancets lancets      amLODipine (NORVASC) 10 MG tablet Take 10 mg by mouth daily.     aspirin 81 MG tablet Take 1 tablet by mouth daily.     azithromycin (ZITHROMAX Z-PAK) 250 MG tablet Follow package directions 6 each 0   capecitabine (XELODA) 500 MG tablet Take 2 tablets  (1,000 mg total) by mouth 2 (two) times daily after a meal. Take Monday-Friday. Take only on days of radiation. 120 tablet 0   carvedilol (COREG) 3.125 MG tablet Take 1 tablet by mouth 2 (two) times daily. cardio     diclofenac Sodium (VOLTAREN) 1 % GEL Research Patient: Apply 0.5 g (1 fingertip) to each hand and each foot twice daily for the duration of radiation therapy 200 g 0   diphenoxylate-atropine (LOMOTIL) 2.5-0.025 MG tablet Take 1 tablet by mouth 4 (four) times daily as needed for diarrhea or loose stools. 60 tablet 1   glucose blood (ACCU-CHEK AVIVA PLUS) test strip USE TWICE DAILY AS DIRECTED     lidocaine (XYLOCAINE) 2 % solution Use as directed 15 mLs in the mouth or throat every 6 (six) hours as needed for mouth pain. 100 mL 0   lisinopril-hydrochlorothiazide (ZESTORETIC) 20-25 MG tablet Take 1 tablet by mouth daily.     metFORMIN (GLUCOPHAGE) 500 MG tablet TAKE 1 TABLET TWICE DAILY (Patient taking differently: 500 mg. TAKE 1 TABLETin morning and 2 at night/ Dr Gershon Crane) 180 tablet 1   prochlorperazine (COMPAZINE) 10 MG tablet Take 1 tablet (10 mg total) by mouth every 6 (six) hours as needed for nausea or vomiting. 30 tablet 1   silver sulfADIAZINE (SILVADENE) 1 % cream Apply 1 Application topically 2 (two) times daily. 50 g 2   simvastatin (ZOCOR) 20 MG tablet Take 1 tablet (20 mg total) by mouth daily. 90 tablet 1   tiZANidine (ZANAFLEX) 4 MG tablet Take 1 tablet (4 mg total) by mouth at bedtime. (Patient taking differently: Take 2 mg by mouth at bedtime.) 90 tablet 1   colchicine 0.6 MG tablet TAKE 1 TABLET(0.6 MG) BY MOUTH DAILY (Patient not taking: Reported on 03/21/2023) 10 tablet 1   Morphine Sulfate (MORPHINE CONCENTRATE) 10 mg / 0.5 ml concentrated solution Take 1 mL (20 mg total) by mouth every 2 (two) hours as needed for severe pain (pain score 7-10). 15 mL 0   traMADol (ULTRAM) 50 MG tablet Take 1 tablet (50 mg total) by mouth every 12 (twelve) hours as needed. 30 tablet 0    Current Facility-Administered Medications  Medication Dose Route Frequency Provider Last Rate Last Admin   albuterol (PROVENTIL) (2.5 MG/3ML) 0.083% nebulizer solution 2.5 mg  2.5 mg Nebulization Once [name] C, MD        OBJECTIVE: Vitals:   04/12/23 1057  BP: (!) 106/48  Pulse: 69  Resp: 16  Temp: 98.6 F (37 C)  SpO2: 99%       Body mass index is 18.88 kg/m.    ECOG FS:1 - Symptomatic but completely ambulatory  General: Well-developed, well-nourished, no acute distress.  Sitting in wheelchair. Eyes: Pink conjunctiva, anicteric sclera. HEENT: Normocephalic, moist mucous membranes. Lungs: No audible wheezing or coughing. Heart: Regular rate and rhythm. Abdomen: Soft, nontender, no obvious distention. Musculoskeletal: No edema, cyanosis, or clubbing. Neuro: Alert, answering all questions appropriately. Cranial nerves grossly intact. Skin: No rashes or petechiae noted. Psych: Normal  affect.  LAB RESULTS:  Lab Results  Component Value Date   NA 138 04/12/2023   K 3.9 04/12/2023   CL 109 04/12/2023   CO2 21 (L) 04/12/2023   GLUCOSE 97 04/12/2023   BUN 31 (H) 04/12/2023   CREATININE 1.34 (H) 04/12/2023   CALCIUM 9.1 04/12/2023   PROT 6.8 04/12/2023   ALBUMIN 3.5 04/12/2023   AST 22 04/12/2023   ALT 8 04/12/2023   ALKPHOS 39 04/12/2023   BILITOT 0.9 04/12/2023   GFRNONAA 39 (L) 04/12/2023   GFRAA 59 (L) 09/24/2019    Lab Results  Component Value Date   WBC 2.9 (L) 04/12/2023   NEUTROABS 1.6 (L) 04/12/2023   HGB 8.3 (L) 04/12/2023   HCT 26.0 (L) 04/12/2023   MCV 88.7 04/12/2023   PLT 112 (L) 04/12/2023     STUDIES: NM PET Image Restag (PS) Skull Base To Thigh Result Date: 04/07/2023 CLINICAL DATA:  Subsequent treatment strategy for anorectal cancer and head/neck cancer. EXAM: NUCLEAR MEDICINE PET SKULL BASE TO THIGH TECHNIQUE: 6.97 mCi F-18 FDG was injected intravenously. Full-ring PET imaging was performed from the skull base to thigh after the  radiotracer. CT data was obtained and used for attenuation correction and anatomic localization. Fasting blood glucose: 64 mg/dl COMPARISON:  PET-CT 96/05/5407 FINDINGS: Mediastinal blood pool activity: SUV max 1.98 Liver activity: SUV max NA NECK: Again demonstrated is a large oropharyngeal mass centered in the right tonsillar region. It appears to involve the epiglottis but no subglottic extension. SUV max is 19.27 and was previously 20.7. Right-sided level 2 lymph node is slightly larger measuring 9 mm and previously measuring 7 mm. SUV max is 9.68 and was previously 3.4. No new cervical adenopathy. There are 2 hypermetabolic thyroid nodules. The left thyroid nodule has an SUV max of 9.58 and was previously 9.48. The right nodule has an SUV max of 6.58 and was previously 5.3. Incidental CT findings: Bilateral carotid artery calcifications. CHEST: No hypermetabolic mediastinal or hilar nodes. No suspicious pulmonary nodules on the CT scan. No hypermetabolic breast masses or axillary adenopathy. Incidental CT findings: Stable advanced aortic and coronary artery calcifications. No acute pulmonary findings. ABDOMEN/PELVIS: Persistent anorectal mass is difficult to measure but appears smaller. SUV max is 17.11 and was previously 22.01. No findings suspicious for abdominopelvic metastatic disease. Incidental CT findings: Stable advanced vascular calcifications. The gallbladder is surgically absent. Stable diffuse colonic diverticulosis. SKELETON: No findings for osseous metastatic disease. Incidental CT findings: None. IMPRESSION: 1. 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 It appears to involve the epiglottis but no subglottic extension. Stable level of hypermetabolism. 2. Slightly larger right-sided level 2 lymph node with increased FDG activity. 3. Persistent anorectal mass is difficult to measure but appears smaller. Slight decrease in FDG uptake. 4. Stable hypermetabolic thyroid  nodules. 5. No findings for metastatic disease involving the chest, abdomen/pelvis or bony structures. 6. Stable vascular disease. 7. Aortic atherosclerosis. Electronically Signed   By: Rudie Meyer M.D.   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  Patient has now completed concurrent XRT along with chemotherapy.  Her last dose of mitomycin was on March 07, 2023.  She  finished Xeloda and XRT on March 25, 2023.  Repeat PET scan results from April 07, 2023 reviewed independently and reported as above with persistent hypermetabolism at the site of her anal cancer, but PET scan was done sooner than required given her head and neck cancer and this may be residual inflammation/irritation from XRT.  A referral has been sent back to surgery for direct visualization.  Continue to monitor closely.  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  Patient is now more symptomatic with difficulty swallowing and pain.  She will have consultation with radiation oncology for treatment planning and plan to give concurrent XRT along with weekly cisplatin.  Return to clinic on April 21, 2023 to initiate cycle 1 of weekly cisplatin.  Patient has had port placed.   Thyroid nodule with hypermetabolism: Defer ultrasound and biopsy until completion of treatment of 2 malignancies above. Anemia: Hemoglobin mildly improved 8.3.  She does not wish to return to clinic tomorrow for transfusion. Leukopenia: Improving, total white count is 2.9.  Monitor. Hypomagnesia: Resolved.   Renal insufficiency: Creatinine improved to 1.34.  Monitor closely with initiation of  cisplatin.  Patient will likely require weekly fluids in addition to the fluids she receives her treatment.   Dysphagia: Getting worse.  PET scan results as above.  Patient was given prescriptions for tramadol and liquid morphine today.  She also had consultations with dietary and will see speech pathology next week.  Patient expressed understanding and was in agreement with this plan. She also understands that She can call clinic at any time with any questions, concerns, or complaints.    Cancer Staging  Anal squamous cell carcinoma (HCC) Staging form: Anus, AJCC V9 - Clinical stage from 01/18/2023: Stage IIIA (cT3, cN0, cM0) - Signed by Jeralyn Ruths, MD on 01/18/2023 Stage prefix: Initial diagnosis  Right tonsillar squamous cell carcinoma (HCC) Staging form: Pharynx - P16 Negative Oropharynx, AJCC 8th Edition - Clinical stage from 04/12/2023: Stage III (cT3, cN1, cM0, p16: Unknown) - Signed by Jeralyn Ruths, MD on 04/12/2023 Stage prefix: Initial diagnosis   Jeralyn Ruths, MD   04/12/2023 1:05 PM

## 2023-04-12 NOTE — Progress Notes (Signed)
 START ON PATHWAY REGIMEN - Head and Neck     A cycle is every 7 days:     Cisplatin   **Always confirm dose/schedule in your pharmacy ordering system**  Patient Characteristics: Oropharynx, HPV Negative/Unknown, Preoperative or Nonsurgical Candidate (Clinical Staging), Stage III, Not Eligible for Surgery Disease Classification: Oropharynx HPV Status: Awaiting Test Results Therapeutic Status: Preoperative or Nonsurgical Candidate (Clinical Staging) AJCC T Category: cT3 AJCC 8 Stage Grouping: III AJCC N Category: cN1 AJCC M Category: cM0 Surgical Candidacy: Not Eligible for Surgery Intent of Therapy: Curative Intent, Discussed with Patient

## 2023-04-12 NOTE — Progress Notes (Signed)
 Having bad pain on the right side of throat and neck. Started a couple days ago. States she can hardly swallow and not able to eat much.

## 2023-04-12 NOTE — Progress Notes (Signed)
 Nutrition Follow-up:  Patient with SCC of anus, likely head and neck cancer and thyroid nodule.  Patient completed radiation to anus on 03/24/22 and concurrent xeloda and mitomycin.  Planning to start concurrent chemotherapy and radiation for head and neck cancer.    Met with patient following MD visit.  Patient right side throat and neck pain. Having pain when swallowing, decreased ability to eat due to pain.  Says that she has been drinking 1 carton of oral nutrition supplement a day.  Said she ate some pinto beans the other day.  Overall intake of solid foods and liquids extremely poor.  Says that she is not having anymore diarrhea.    Says that she is able to swallow pills  Medications: MVI  Labs: reviewed  Anthropometrics:   Weight 117 lb today, decreased  129 lb 2 oz on 2/5 137 lb on 1/10 144 lb 8 oz on 12/30 142 lb on 10/24  17% weight loss in the last 4 1/2 months, significant   Estimated Energy Needs  Kcals: 1600-1800 Protein: 80-90 g Fluid: 1600-1800 ml  NUTRITION DIAGNOSIS: Unintentional weight loss continues   MALNUTRITION DIAGNOSIS: Patient meets criteria for severe malnutrition in context of chronic illness as evidenced by 17% weight loss in the last 4 1/2 months and eating less than 75% of estimated needs in the last month   INTERVENTION:  Recommend placement of feeding tube with upcoming concurrent chemotherapy and radiation for head and neck cancer.  Patient with weight loss, poor appetite, dysphagia, pain on swallowing prior to starting treatment.  Discussed with MD. Discussed setting up weekly fluids with MD and nursing Patient planning to meet with SLP Pain medications being adjusted today. Recommend 4 high calorie oral nutrition supplements (boost plus, ensure plus, equate plus) Discussed smooth, moist foods high in calories (creamy soups, yogurt, pudding, ice cream, milkshakes, etc) Continue MVI daily     MONITORING, EVALUATION, GOAL: weight trends,  intake   NEXT VISIT: Thursday, March 13 during infusion  Derrion Tritz B. Freida Busman, RD, LDN Registered Dietitian [phone]

## 2023-04-12 NOTE — Progress Notes (Signed)
 ALERT: A disease instance has been permanently removed from this patient's pathway record and replaced with a new disease instance. Information on the new disease instance will be transmitted in a separate message.  Disease Being Removed: Anal Carcinoma  Reason for Removal: Reason not listed

## 2023-04-13 ENCOUNTER — Inpatient Hospital Stay

## 2023-04-13 ENCOUNTER — Inpatient Hospital Stay: Payer: Medicare HMO

## 2023-04-13 ENCOUNTER — Other Ambulatory Visit: Payer: Self-pay | Admitting: Oncology

## 2023-04-13 ENCOUNTER — Encounter: Payer: Self-pay | Admitting: Radiation Oncology

## 2023-04-13 ENCOUNTER — Ambulatory Visit
Admission: RE | Admit: 2023-04-13 | Discharge: 2023-04-13 | Disposition: A | Discharge status: Home / Self Care | Source: Ambulatory Visit | Attending: Radiation Oncology | Admitting: Radiation Oncology

## 2023-04-13 VITALS — BP 133/77 | HR 66 | Temp 97.1°F | Resp 12

## 2023-04-13 DIAGNOSIS — C109 Malignant neoplasm of oropharynx, unspecified: Secondary | ICD-10-CM | POA: Insufficient documentation

## 2023-04-13 DIAGNOSIS — Z510 Encounter for antineoplastic radiation therapy: Secondary | ICD-10-CM | POA: Insufficient documentation

## 2023-04-13 DIAGNOSIS — C099 Malignant neoplasm of tonsil, unspecified: Secondary | ICD-10-CM

## 2023-04-13 MED ORDER — HEPARIN SOD (PORK) LOCK FLUSH 100 UNIT/ML IV SOLN
500.0000 [IU] | Freq: Once | INTRAVENOUS | Status: AC
  Administered 2023-04-13: 500 [IU] via INTRAVENOUS
  Filled 2023-04-13: qty 5

## 2023-04-13 MED ORDER — DEXAMETHASONE SODIUM PHOSPHATE 10 MG/ML IJ SOLN
10.0000 mg | Freq: Once | INTRAMUSCULAR | Status: AC
  Administered 2023-04-13: 10 mg via INTRAVENOUS
  Filled 2023-04-13: qty 1

## 2023-04-13 MED ORDER — SODIUM CHLORIDE 0.9 % IV SOLN
Freq: Once | INTRAVENOUS | Status: AC
  Filled 2023-04-13: qty 250

## 2023-04-13 NOTE — Progress Notes (Signed)
 Pharmacist Chemotherapy Monitoring - Initial Assessment    Anticipated start date: 04/21/23   The following has been reviewed per standard work regarding the patient's treatment regimen: The patient's diagnosis, treatment plan and drug doses, and organ/hematologic function Lab orders and baseline tests specific to treatment regimen  The treatment plan start date, drug sequencing, and pre-medications Prior authorization status  Patient's documented medication list, including drug-drug interaction screen and prescriptions for anti-emetics and supportive care specific to the treatment regimen The drug concentrations, fluid compatibility, administration routes, and timing of the medications to be used The patient's access for treatment and lifetime cumulative dose history, if applicable  The patient's medication allergies and previous infusion related reactions, if applicable   Changes made to treatment plan:  N/A  Follow up needed:  N/A   Sharen Hones, PharmD, BCPS Clinical Pharmacist   04/13/2023  1:22 PM

## 2023-04-13 NOTE — Progress Notes (Signed)
 Radiation Oncology Follow up Note old patient new area oropharyngeal cancer  Name: April [name]   Date:   04/13/2023 MRN:  [id number] DOB: September 05, 1940    This 83 y.o. 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  REFERRING PROVIDER: [name], MD  HPI: Patient is a 83 year old female well-known to our department.  She has recently completed concurrent chemoradiation therapy for locally of asked squamous cell carcinoma of the anus.  She has done well healing well from that.  She specifically denies any significant GI upset or anal pain.  She was also noted on initial workup to have a large greater than 4 seen sonometer oropharyngeal lesion with multiple positive right sided lymph nodes..  Biopsy of the oropharynx was positive for squamous cell carcinoma involving the right tonsil.  PET scan demonstrated a large hypermetabolic mass in the oropharynx with 2 positive cervical lymph nodes.  She is having a sore throat some minor dysphagia at this time.  She has been seen by medical oncology and plan is to proceed with concurrent chemoradiation therapy with curative intent for her tonsillar carcinoma.  COMPLICATIONS OF TREATMENT: none  FOLLOW UP COMPLIANCE: keeps appointments   PHYSICAL EXAM:  BP 133/77   Pulse 66   Temp (!) 97.1 F (36.2 C)   Resp 12  There is prominent adenopathy in the right cervical chain.  Also has ulcerative lesion in the posterior oropharynx consistent with known oropharyngeal carcinoma.  Well-developed well-nourished patient in NAD. HEENT reveals PERLA, EOMI, discs not visualized.  Oral cavity is clear. No oral mucosal lesions are identified. Neck is clear without evidence of cervical or supraclavicular adenopathy. Lungs are clear to A&P. Cardiac examination is essentially unremarkable with  regular rate and rhythm without murmur rub or thrill. Abdomen is benign with no organomegaly or masses noted. Motor sensory and DTR levels are equal and symmetric in the upper and lower extremities. Cranial nerves II through XII are grossly intact. Proprioception is intact. No peripheral adenopathy or edema is identified. No motor or sensory levels are noted. Crude visual fields are within normal range.  RADIOLOGY RESULTS: PET CT scan reviewed compatible with above-stated findings  PLAN: This time I go ahead with IMRT radiation therapy to her primary oropharyngeal carcinoma as well as a positive cervical lymph nodes.  Would treat both areas to 70 Gray over 7 weeks.  Risks and benefits of treatment including increased dysphagia oral mucositis skin reaction fatigue alteration blood counts loss of taste all were described in detail to the patient.  Will treat her remaining neck nodes to 54 Gray using IMRT dose painting technique.  Patient comprehends my recommendations well.  I have personally 7 ordered CT simulation for tomorrow.  I would like to take this opportunity to thank you for allowing me to participate in the care of your patient.Carmina Miller, MD

## 2023-04-13 NOTE — Patient Instructions (Signed)
 Dexamethasone Injection What is this medication? DEXAMETHASONE (dex a METH a sone) treats many conditions such as asthma, allergic reactions, arthritis, inflammatory bowel diseases, adrenal, and blood or bone marrow disorders. It works by decreasing inflammation and slowing down an overactive immune system. It belongs to a group of medications called steroids. This medicine may be used for other purposes; ask your health care provider or pharmacist if you have questions. COMMON BRAND NAME(S): Decadron, DEX24, DoubleDex, ReadySharp Dexamethasone, Simplist Dexamethasone, Solurex What should I tell my care team before I take this medication? They need to know if you have any of these conditions: Cushing syndrome Diabetes Glaucoma Heart attack Heart disease High blood pressure Infection, such as tuberculosis (TB), bacterial, fungal, or viral infections Kidney disease Liver disease Mental health condition Myasthenia gravis Osteoporosis Seizures Stomach or intestine problems Thyroid disease An unusual or allergic reaction to dexamethasone, lactose, other medications, foods, dyes, or preservatives Pregnant or trying to get pregnant Breastfeeding How should I use this medication? This medication is injected into a muscle, joint, lesion, or other tissue. It is given by your care team in a hospital or clinic setting. Talk to your care team about the use of this medication in children. Special care may be needed. Overdosage: If you think you have taken too much of this medicine contact a poison control center or emergency room at once. NOTE: This medicine is only for you. Do not share this medicine with others. What if I miss a dose? This does not apply. What may interact with this medication? Do not take this medication with any of the following: Live virus vaccines This medication may also interact with the following: Aminoglutethimide Amphotericin B Aspirin and aspirin-like  medications Certain antibiotics, such as erythromycin, clarithromycin, troleandomycin Certain antivirals for HIV or hepatitis Certain medications for seizures, such as carbamazepine, phenobarbital, phenytoin Certain medications to treat myasthenia gravis Cholestyramine Cyclosporine Digoxin Diuretics Ephedrine Estrogen and progestin hormones Insulin or other medications for diabetes Isoniazid Ketoconazole Medications that relax muscles for surgery Mifepristone NSAIDs, medications for pain and inflammation, such as ibuprofen or naproxen Rifampin Skin tests for allergies Thalidomide Vaccines Warfarin This list may not describe all possible interactions. Give your health care provider a list of all the medicines, herbs, non-prescription drugs, or dietary supplements you use. Also tell them if you smoke, drink alcohol, or use illegal drugs. Some items may interact with your medicine. What should I watch for while using this medication? Visit your care team for regular checks on your progress. Tell your care team if your symptoms do not start to get better or if they get worse. Your condition will be monitored carefully while you are receiving this medication. Wear a medical ID bracelet or chain. Carry a card that describes your condition. List the medications and doses you take on the card. This medication may increase your risk of getting an infection. Call your care team for advice if you get a fever, chills, sore throat, or other symptoms of a cold or flu. Do not treat yourself. Try to avoid being around people who are sick. If you have not had the measles or chickenpox vaccines, tell your care team right away if you are around someone with these viruses. If you are going to need surgery or other procedure, tell your care team that you are using this medication. You may need to be on a special diet while you are taking this medication. Ask your care team. Also, find out how many glasses of  fluids you need to drink each day. This medication may increase blood sugar. The risk may be higher in patients who already have diabetes. Ask your care team what you can do to lower your risk of diabetes while taking this medication. What side effects may I notice from receiving this medication? Side effects that you should report to your care team as soon as possible: Allergic reactions--skin rash, itching, hives, swelling of the face, lips, tongue, or throat Cushing syndrome--increased fat around the midsection, upper back, neck, or face, pink or purple stretch marks on the skin, thinning, fragile skin that easily bruises, unexpected hair growth High blood sugar (hyperglycemia)--increased thirst or amount of urine, unusual weakness or fatigue, blurry vision Increase in blood pressure Infection--fever, chills, cough, sore throat, wounds that don't heal, pain or trouble when passing urine, general feeling of discomfort or being unwell Low adrenal gland function--nausea, vomiting, loss of appetite, unusual weakness or fatigue, dizziness Mood and behavior changes--anxiety, nervousness, confusion, hallucinations, irritability, hostility, thoughts of suicide or self-harm, worsening mood, feelings of depression Stomach bleeding--bloody or black, tar-like stools, vomiting blood or brown material that looks like coffee grounds Swelling of the ankles, hands, or feet Side effects that usually do not require medical attention (report to your care team if they continue or are bothersome): Acne General discomfort and fatigue Headache Increase in appetite Nausea Trouble sleeping Weight gain This list may not describe all possible side effects. Call your doctor for medical advice about side effects. You may report side effects to FDA at 1-800-FDA-1088. Where should I keep my medication? This medication is given in a hospital or clinic. It will not be stored at home. NOTE: This sheet is a summary. It may  not cover all possible information. If you have questions about this medicine, talk to your doctor, pharmacist, or health care provider.  2024 Elsevier/Gold Standard (2021-07-02 00:00:00)

## 2023-04-14 ENCOUNTER — Ambulatory Visit
Admission: RE | Admit: 2023-04-14 | Discharge: 2023-04-14 | Disposition: A | Discharge status: Home / Self Care | Source: Ambulatory Visit | Attending: Radiation Oncology | Admitting: Radiation Oncology

## 2023-04-14 DIAGNOSIS — Z510 Encounter for antineoplastic radiation therapy: Secondary | ICD-10-CM | POA: Diagnosis not present

## 2023-04-18 NOTE — Progress Notes (Deleted)
 Surgical Clinic Progress/Follow-up Note   HPI:  83 y.o. Female presents to clinic for follow-up , she presents today with her son Zoe Lan with her.   Now post treatment with chemotherapy and radiation, for evaluation of residual disease.   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  Patient has now completed concurrent XRT along with chemotherapy.  Her last dose of mitomycin was on March 07, 2023.  She finished Xeloda and XRT on March 25, 2023.  Repeat PET scan results from April 07, 2023 reviewed independently and reported as above with persistent hypermetabolism at the site of her anal cancer, but PET scan was done sooner than required given her head and neck cancer and this may be residual inflammation/irritation from XRT.  A referral has been sent back to surgery for direct visualization.  Continue to monitor closely."  Patient reports  improvement/resolution of prior issues and has been tolerating regular diet with +flatus and normal BM's, denies N/V, fever/chills, CP, or SOB. She still is having minimal bleeding.  Review of Systems:  Constitutional: denies fever/chills  ENT: denies sore throat, hearing problems  Respiratory: denies shortness of breath, wheezing  Cardiovascular: denies chest pain, palpitations  Gastrointestinal: denies abdominal pain, N/V, or diarrhea/and bowel function as per interval history Skin: Denies any other rashes or skin discoloration as per interval history  Vital Signs:  There were no vitals taken for this visit.   Physical Exam:  Constitutional:  -- Normalbody habitus  -- Awake, alert, and oriented x3  Pulmonary:  -- Breathing non-labored at rest  Gastrointestinal:  -- Soft and non-distended, non-tender GU  --*** DRE Musculoskeletal / Integumentary:  -- Wounds or skin  discoloration: None appreciated -- Extremities: w/o lesion/or edema   Laboratory studies:   Imaging:  CLINICAL DATA:  Initial treatment strategy for rectal cancer.   EXAM: NUCLEAR MEDICINE PET SKULL BASE TO THIGH   TECHNIQUE: 7.3 mCi F-18 FDG was injected intravenously. Full-ring PET imaging was performed from the skull base to thigh after the radiotracer. CT data was obtained and used for attenuation correction and anatomic localization.   Fasting blood glucose: 82 mg/dl   COMPARISON:  None Available.   FINDINGS: Mediastinal blood pool activity: SUV max 1.8   Liver activity: SUV max NA   NECK:   Right oropharyngeal mass measures approximately 1.6 x 2.3 cm (6/20), SUV max 20.7. Adjacent right level II lymph nodes with index lymph node measuring 7 mm (6/26), SUV max 3.4.   Incidental CT findings:   None.   CHEST:   Heterogeneous, enlarged and nodular thyroid with areas of hypermetabolism. Index lesion on the left, SUV max 9.5. No additional abnormal hypermetabolism.   Incidental CT findings:   Atherosclerotic calcification of the aorta, aortic valve and coronary arteries. Heart is enlarged. No pericardial or pleural effusion.   ABDOMEN/PELVIS:   Anorectal hypermetabolism, SUV max 11.4, with soft tissue fullness on CT. A discrete mass is difficult to measure without IV or enteric contrast. No perirectal hypermetabolic lymph nodes. No additional abnormal hypermetabolism.   Incidental CT findings:   Cholecystectomy. Low-attenuation lesions in the kidneys. No specific follow-up necessary.   SKELETON:   No abnormal hypermetabolism.   Incidental CT findings:   Degenerative changes in the spine.   IMPRESSION: 1. 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 No evidence of distant metastatic  disease. 2. Hypermetabolic right oropharyngeal mass with hypermetabolic right level II cervical lymph nodes, compatible with  primary head/neck cancer. 3. Heterogeneous, nodular and enlarged thyroid with areas of hypermetabolism. Recommend thyroid US and biopsy. (Ref: J Am Coll Radiol. 2015 Feb;12(2): 143-50). 4. Aortic atherosclerosis (ICD10-I70.0). Coronary artery calcification.     Electronically Signed   By: Leanna Battles M.D.   On: 01/17/2023 13:19  Assessment:  83 y.o. yo Female with a problem list including...  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  Plan:              - *** radiation and heme/Onc follow-up.  All of the above recommendations were discussed with the patient and patient's family, and all of patient's and family's questions were answered to their expressed satisfaction.  These notes generated with voice recognition software. I apologize for typographical errors.  Campbell Lerner, MD, FACS [place]: [facility] General [facility] - Partnering for exceptional care. Office: [phone]

## 2023-04-19 ENCOUNTER — Encounter: Payer: Self-pay | Admitting: Oncology

## 2023-04-19 ENCOUNTER — Other Ambulatory Visit: Payer: Self-pay | Admitting: *Deleted

## 2023-04-19 ENCOUNTER — Ambulatory Visit: Payer: Self-pay | Admitting: Surgery

## 2023-04-19 DIAGNOSIS — C099 Malignant neoplasm of tonsil, unspecified: Secondary | ICD-10-CM

## 2023-04-19 DIAGNOSIS — C210 Malignant neoplasm of anus, unspecified: Secondary | ICD-10-CM

## 2023-04-20 ENCOUNTER — Other Ambulatory Visit

## 2023-04-20 ENCOUNTER — Ambulatory Visit

## 2023-04-20 ENCOUNTER — Inpatient Hospital Stay

## 2023-04-20 ENCOUNTER — Inpatient Hospital Stay: Admitting: Oncology

## 2023-04-21 ENCOUNTER — Other Ambulatory Visit

## 2023-04-21 ENCOUNTER — Inpatient Hospital Stay

## 2023-04-21 ENCOUNTER — Ambulatory Visit

## 2023-04-21 ENCOUNTER — Ambulatory Visit: Admitting: Oncology

## 2023-04-21 ENCOUNTER — Ambulatory Visit: Admitting: Surgery

## 2023-04-21 DIAGNOSIS — Z510 Encounter for antineoplastic radiation therapy: Secondary | ICD-10-CM | POA: Diagnosis not present

## 2023-04-21 NOTE — Progress Notes (Signed)
 Nutrition Follow-up:  Patient with SCC of anus and head and neck cancer, and thyroid nodule.  Patient has completed treatment for anus cancer.  Planning concurrent chemotherapy and radiation on 3/17.  Met with patient today in clinic. Reports that her appetite is better.  Says that she was able to eat a chicken sandwich for lunch yesterday and diet Mt Dew.  Supper was 6 crackers with tuna salad and a chocolate fudge round.  Drinks a bottle of glucerna a day.  Has not eaten anything so far today. Says that swallowing is better.    Medications: reviewed  Labs: no new  Anthropometrics:   Weight 117 lb on 3/4  129 lb 2 oz on 2/5 137 lb on 1/10 144 lb 8 oz on 12/30 142 lb on 10/24    NUTRITION DIAGNOSIS: Unintentional weight loss stable   Severe malnutrition continues  INTERVENTION:  Increased oral nutrition supplement to 3-4 times a day. Likes the taste of glucerna (lower calories and sugar).  Coupons given Discussed soft, high calorie, high protein foods   MONITORING, EVALUATION, GOAL: weight trends, intake   NEXT VISIT: Wed, Mar 26 after radiation  April [name] B. Freida Busman, RD, LDN Registered Dietitian [phone]

## 2023-04-22 MED FILL — Fosaprepitant Dimeglumine For IV Infusion 150 MG (Base Eq): INTRAVENOUS | Qty: 5 | Status: AC

## 2023-04-25 ENCOUNTER — Inpatient Hospital Stay (HOSPITAL_BASED_OUTPATIENT_CLINIC_OR_DEPARTMENT_OTHER): Admitting: Oncology

## 2023-04-25 ENCOUNTER — Inpatient Hospital Stay

## 2023-04-25 ENCOUNTER — Encounter: Payer: Self-pay | Admitting: Oncology

## 2023-04-25 ENCOUNTER — Other Ambulatory Visit

## 2023-04-25 ENCOUNTER — Ambulatory Visit
Admission: RE | Admit: 2023-04-25 | Discharge: 2023-04-25 | Disposition: A | Discharge status: Home / Self Care | Source: Ambulatory Visit | Attending: Radiation Oncology | Admitting: Radiation Oncology

## 2023-04-25 ENCOUNTER — Ambulatory Visit

## 2023-04-25 ENCOUNTER — Ambulatory Visit: Payer: Medicare HMO | Admitting: Radiation Oncology

## 2023-04-25 VITALS — BP 133/58 | HR 80 | Temp 98.6°F | Resp 20 | Wt 122.9 lb

## 2023-04-25 DIAGNOSIS — C099 Malignant neoplasm of tonsil, unspecified: Secondary | ICD-10-CM | POA: Diagnosis not present

## 2023-04-25 DIAGNOSIS — C210 Malignant neoplasm of anus, unspecified: Secondary | ICD-10-CM

## 2023-04-25 DIAGNOSIS — Z510 Encounter for antineoplastic radiation therapy: Secondary | ICD-10-CM | POA: Diagnosis not present

## 2023-04-25 LAB — CBC WITH DIFFERENTIAL (CANCER CENTER ONLY)
Abs Immature Granulocytes: 0.03 10*3/uL (ref 0.00–0.07)
Basophils Absolute: 0 10*3/uL (ref 0.0–0.1)
Basophils Relative: 0 %
Eosinophils Absolute: 0 10*3/uL (ref 0.0–0.5)
Eosinophils Relative: 0 %
HCT: 27.2 % — ABNORMAL LOW (ref 36.0–46.0)
Hemoglobin: 8.6 g/dL — ABNORMAL LOW (ref 12.0–15.0)
Immature Granulocytes: 1 %
Lymphocytes Relative: 19 %
Lymphs Abs: 0.7 10*3/uL (ref 0.7–4.0)
MCH: 28.9 pg (ref 26.0–34.0)
MCHC: 31.6 g/dL (ref 30.0–36.0)
MCV: 91.3 fL (ref 80.0–100.0)
Monocytes Absolute: 0.4 10*3/uL (ref 0.1–1.0)
Monocytes Relative: 10 %
Neutro Abs: 2.6 10*3/uL (ref 1.7–7.7)
Neutrophils Relative %: 70 %
Platelet Count: 110 10*3/uL — ABNORMAL LOW (ref 150–400)
RBC: 2.98 MIL/uL — ABNORMAL LOW (ref 3.87–5.11)
RDW: 19.6 % — ABNORMAL HIGH (ref 11.5–15.5)
WBC Count: 3.7 10*3/uL — ABNORMAL LOW (ref 4.0–10.5)
nRBC: 0 % (ref 0.0–0.2)

## 2023-04-25 LAB — BASIC METABOLIC PANEL - CANCER CENTER ONLY
Anion gap: 9 (ref 5–15)
BUN: 18 mg/dL (ref 8–23)
CO2: 24 mmol/L (ref 22–32)
Calcium: 8.8 mg/dL — ABNORMAL LOW (ref 8.9–10.3)
Chloride: 102 mmol/L (ref 98–111)
Creatinine: 1.19 mg/dL — ABNORMAL HIGH (ref 0.44–1.00)
GFR, Estimated: 45 mL/min — ABNORMAL LOW (ref >60)
Glucose, Bld: 123 mg/dL — ABNORMAL HIGH (ref 70–99)
Potassium: 3.1 mmol/L — ABNORMAL LOW (ref 3.5–5.1)
Sodium: 135 mmol/L (ref 135–145)

## 2023-04-25 LAB — MAGNESIUM: Magnesium: 1.5 mg/dL — ABNORMAL LOW (ref 1.7–2.4)

## 2023-04-25 LAB — SAMPLE TO BLOOD BANK

## 2023-04-25 MED FILL — Fosaprepitant Dimeglumine For IV Infusion 150 MG (Base Eq): INTRAVENOUS | Qty: 5 | Status: AC

## 2023-04-25 NOTE — Progress Notes (Signed)
 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  INTERVAL HISTORY: Patient returns to clinic today for further evaluation and initiation of treatment of her head and neck cancer.  Her pain is better controlled with morphine.  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  She has no neurologic complaints.  She denies any recent fevers or illnesses.  She has a poor appetite.  She has no chest pain, shortness of breath, cough, or hemoptysis.  She denies any nausea, vomiting, constipation, or diarrhea.  She has no melena or hematochezia.  She has no urinary complaints.  Patient offers no further specific complaints today.  REVIEW OF SYSTEMS:   Review of Systems  Constitutional:  Positive for malaise/fatigue. Negative for fever and weight loss.  HENT:  Positive for sore throat.   Respiratory: Negative.  Negative for cough, hemoptysis and shortness of breath.   Cardiovascular: Negative.  Negative for chest pain and leg swelling.  Gastrointestinal: Negative.  Negative for abdominal pain, blood in stool, diarrhea and melena.  Genitourinary: Negative.  Negative for dysuria.  Musculoskeletal: Negative.  Negative for back pain.  Skin: Negative.  Negative for rash.  Neurological:  Positive for weakness. Negative for dizziness, focal weakness and headaches.  Psychiatric/Behavioral: Negative.  The patient is not  nervous/anxious.    As per HPI. Otherwise, a complete review of systems is negative.  PAST MEDICAL HISTORY: Past Medical History:  Diagnosis Date   Anemia    Diabetes mellitus without complication (HCC)    Gout    Hyperlipidemia    Hypertension    Wears dentures    full upper and lower    PAST SURGICAL HISTORY: Past Surgical History:  Procedure Laterality Date   APPENDECTOMY     gallstones  06/12/2009   MICROLARYNGOSCOPY Right 01/28/2023   Procedure: MICRODIRECT LARYNGOSCOPY WITH BIOPSY OF OROPHARYNGEAL MASS;  Surgeon: Linus Salmons, MD;  Location: [facility];  Service: ENT;  Laterality: Right;  Diabetic   PORTACATH PLACEMENT N/A 02/18/2023   Procedure: INSERTION PORT-A-CATH;  Surgeon: Campbell Lerner, MD;  Location: [facility];  Service: General;  Laterality: N/A;   TONSILLECTOMY      FAMILY HISTORY: Family History  Problem Relation Age of Onset   Breast cancer Neg Hx     ADVANCED DIRECTIVES (Y/N):  N  HEALTH MAINTENANCE: Social History   Tobacco Use   Smoking status: Former    Current packs/day: 0.00    Average packs/day: 0.5 packs/day for 8.0 years (4.0 ttl pk-yrs)    Types: Cigarettes    Start date: 62    Quit date: 1990    Years since quitting: 35.2    Passive exposure: Past   Smokeless tobacco: Never  Vaping Use   Vaping status: Never Used  Substance Use Topics   Alcohol use: No    Alcohol/week: 0.0 standard drinks of alcohol   Drug use: No     Colonoscopy:  PAP:  Bone density:  Lipid panel:  No Known Allergies  Current Outpatient Medications  Medication Sig Dispense Refill   Accu-Chek Softclix Lancets lancets      amLODipine (NORVASC) 10 MG tablet Take 10 mg by mouth daily.     aspirin 81 MG tablet Take 1 tablet by mouth daily.     capecitabine (XELODA) 500 MG tablet Take 2 tablets (1,000 mg total) by mouth 2 (two) times daily after a meal. Take Monday-Friday. Take only on days of radiation. 120 tablet 0   carvedilol (COREG)  3.125 MG tablet Take 1 tablet by mouth 2 (two) times daily. cardio     diclofenac Sodium (VOLTAREN) 1 % GEL Research Patient: Apply 0.5 g (1 fingertip) to each hand and each foot twice daily for the duration of radiation therapy 200 g 0   diphenoxylate-atropine (LOMOTIL) 2.5-0.025 MG tablet Take 1 tablet by mouth 4 (four) times daily as needed for diarrhea or loose stools. 60 tablet 1   glucose blood (ACCU-CHEK AVIVA PLUS) test strip USE TWICE DAILY AS DIRECTED     lidocaine (XYLOCAINE) 2 % solution Use as directed 15 mLs in the mouth or throat every 6 (six) hours as needed for mouth pain. 100 mL 0   lisinopril-hydrochlorothiazide (ZESTORETIC) 20-25 MG tablet Take 1 tablet by mouth daily.     metFORMIN (GLUCOPHAGE) 500 MG tablet TAKE 1 TABLET TWICE DAILY (Patient taking differently: 500 mg. TAKE 1 TABLETin morning and 2 at night/ Dr Gershon Crane) 180 tablet 1   Morphine Sulfate (MORPHINE CONCENTRATE) 10 mg / 0.5 ml concentrated solution Take 1 mL (20 mg total) by mouth every 2 (two) hours as needed for severe pain (pain score 7-10). 15 mL 0   prochlorperazine (COMPAZINE) 10 MG tablet Take 1 tablet (10 mg total) by mouth every 6 (six) hours as needed for nausea or vomiting. 30 tablet 1   simvastatin (ZOCOR) 20 MG tablet Take 1 tablet (20 mg total) by mouth daily. 90 tablet 1   tiZANidine (ZANAFLEX) 4 MG tablet Take 1 tablet (4 mg total) by mouth at bedtime. (Patient taking differently: Take 2 mg by mouth at bedtime.) 90 tablet 1   traMADol (ULTRAM) 50 MG tablet Take 1 tablet (50 mg total) by mouth every 12 (twelve) hours as needed. 30 tablet 0   colchicine 0.6 MG tablet TAKE 1 TABLET(0.6 MG) BY MOUTH DAILY (Patient not taking: Reported on 03/21/2023) 10 tablet 1   Current Facility-Administered Medications  Medication Dose Route Frequency Provider Last Rate Last Admin   albuterol (PROVENTIL) (2.5 MG/3ML) 0.083% nebulizer solution 2.5 mg  2.5 mg Nebulization Once [name] C, MD         OBJECTIVE: Vitals:   04/25/23 1038  BP: (!) 133/58  Pulse: 80  Resp: 20  Temp: 98.6 F (37 C)  SpO2: 100%       Body mass index is 19.84 kg/m.    ECOG FS:1 - Symptomatic but completely ambulatory  General: Well-developed, well-nourished, no acute distress. Eyes: Pink conjunctiva, anicteric sclera. HEENT: Normocephalic, moist mucous membranes. Lungs: No audible wheezing or coughing. Heart: Regular rate and rhythm. Abdomen: Soft, nontender, no obvious distention. Musculoskeletal: No edema, cyanosis, or clubbing. Neuro: Alert, answering all questions appropriately. Cranial nerves grossly intact. Skin: No rashes or petechiae noted. Psych: Normal affect.   LAB RESULTS:  Lab Results  Component Value Date   NA 135 04/25/2023   K 3.1 (L) 04/25/2023   CL 102 04/25/2023   CO2 24 04/25/2023   GLUCOSE 123 (H) 04/25/2023   BUN  18 04/25/2023   CREATININE 1.19 (H) 04/25/2023   CALCIUM 8.8 (L) 04/25/2023   PROT 6.8 04/12/2023   ALBUMIN 3.5 04/12/2023   AST 22 04/12/2023   ALT 8 04/12/2023   ALKPHOS 39 04/12/2023   BILITOT 0.9 04/12/2023   GFRNONAA 45 (L) 04/25/2023   GFRAA 59 (L) 09/24/2019    Lab Results  Component Value Date   WBC 3.7 (L) 04/25/2023   NEUTROABS 2.6 04/25/2023   HGB 8.6 (L) 04/25/2023   HCT 27.2 (L) 04/25/2023   MCV 91.3 04/25/2023   PLT 110 (L) 04/25/2023     STUDIES: NM PET Image Restag (PS) Skull Base To Thigh Result Date: 04/07/2023 CLINICAL DATA:  Subsequent treatment strategy for anorectal cancer and head/neck cancer. EXAM: NUCLEAR MEDICINE PET SKULL BASE TO THIGH TECHNIQUE: 6.97 mCi F-18 FDG was injected intravenously. Full-ring PET imaging was performed from the skull base to thigh after the radiotracer. CT data was obtained and used for attenuation correction and anatomic localization. Fasting blood glucose: 64 mg/dl COMPARISON:  PET-CT 40/98/1191 FINDINGS: Mediastinal blood pool activity: SUV max 1.98 Liver activity: SUV max NA NECK:  Again demonstrated is a large oropharyngeal mass centered in the right tonsillar region. It appears to involve the epiglottis but no subglottic extension. SUV max is 19.27 and was previously 20.7. Right-sided level 2 lymph node is slightly larger measuring 9 mm and previously measuring 7 mm. SUV max is 9.68 and was previously 3.4. No new cervical adenopathy. There are 2 hypermetabolic thyroid nodules. The left thyroid nodule has an SUV max of 9.58 and was previously 9.48. The right nodule has an SUV max of 6.58 and was previously 5.3. Incidental CT findings: Bilateral carotid artery calcifications. CHEST: No hypermetabolic mediastinal or hilar nodes. No suspicious pulmonary nodules on the CT scan. No hypermetabolic breast masses or axillary adenopathy. Incidental CT findings: Stable advanced aortic and coronary artery calcifications. No acute pulmonary findings. ABDOMEN/PELVIS: Persistent anorectal mass is difficult to measure but appears smaller. SUV max is 17.11 and was previously 22.01. No findings suspicious for abdominopelvic metastatic disease. Incidental CT findings: Stable advanced vascular calcifications. The gallbladder is surgically absent. Stable diffuse colonic diverticulosis. SKELETON: No findings for osseous metastatic disease. Incidental CT findings: None. IMPRESSION: 1. 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 It appears to involve the epiglottis but no subglottic extension. Stable level of hypermetabolism. 2. Slightly larger right-sided level 2 lymph node with increased FDG activity. 3. Persistent anorectal mass is difficult to measure but appears smaller. Slight decrease in FDG uptake. 4. Stable hypermetabolic thyroid nodules. 5. No findings for metastatic disease involving the chest, abdomen/pelvis or bony structures. 6. Stable vascular disease. 7. Aortic atherosclerosis. Electronically Signed   By: Rudie Meyer M.D.   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  Patient has now completed concurrent XRT along with chemotherapy.  Her last dose of mitomycin was on March 07, 2023.  She finished Xeloda and XRT on March 25, 2023.  Repeat PET scan results from April 07, 2023 reviewed independently and reported as above with persistent hypermetabolism at the site of her anal cancer, but PET scan was done sooner than required  given her head and neck cancer and this may be residual inflammation/irritation from XRT.  A referral has been sent back to surgery for direct visualization.  Continue to monitor closely.  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  Patient is now more symptomatic with difficulty swallowing and pain.  Plan to give concurrent XRT along with weekly cisplatin.  Patient initiated XRT today.  Return to clinic tomorrow for cycle 1 of cisplatin.  Patient will then return to clinic on Friday for IV fluids and then the following Monday for further evaluation and consideration of cycle 2. Thyroid nodule with hypermetabolism: Defer ultrasound and biopsy until completion of treatment of 2 malignancies above. Anemia: Hemoglobin has trended up slightly to 8.6.  Monitor.   Leukopenia: Total white blood cell count has trended up to 3.7.   Thrombocytopenia: Patient platelet count has trended down to 110.  Proceed cautiously with treatment tomorrow as above. Hypomagnesia: Patient will receive 2 g of IV magnesium tomorrow with treatment.  Will consider additional IV magnesium on Friday. Hypokalemia: Patient will receive IV potassium along for treatment tomorrow. Renal insufficiency: Creatinine  improved to 1.19.  Continue to monitor closely with initiation of cisplatin.  IV fluids as above. Dysphagia: Improved with tramadol and liquid morphine.  PET scan results as above.  Patient has been given referrals to dietary and speech pathology.  Patient expressed understanding and was in agreement with this plan. She also understands that She can call clinic at any time with any questions, concerns, or complaints.    Cancer Staging  Anal squamous cell carcinoma (HCC) Staging form: Anus, AJCC V9 - Clinical stage from 01/18/2023: Stage IIIA (cT3, cN0, cM0) - Signed by Jeralyn Ruths, MD on 01/18/2023 Stage prefix: Initial diagnosis  Right tonsillar squamous cell carcinoma (HCC) Staging form: Pharynx - P16 Negative Oropharynx, AJCC 8th Edition - Clinical stage from 04/12/2023: Stage III (cT3, cN1, cM0, p16: Unknown) - Signed by Jeralyn Ruths, MD on 04/12/2023 Stage prefix: Initial diagnosis   Jeralyn Ruths, MD   04/25/2023 12:19 PM

## 2023-04-26 ENCOUNTER — Encounter: Payer: Self-pay | Admitting: Oncology

## 2023-04-26 ENCOUNTER — Ambulatory Visit

## 2023-04-26 ENCOUNTER — Other Ambulatory Visit: Payer: Self-pay | Admitting: Oncology

## 2023-04-26 ENCOUNTER — Inpatient Hospital Stay

## 2023-04-26 DIAGNOSIS — C099 Malignant neoplasm of tonsil, unspecified: Secondary | ICD-10-CM

## 2023-04-27 ENCOUNTER — Ambulatory Visit

## 2023-04-28 ENCOUNTER — Ambulatory Visit

## 2023-04-28 ENCOUNTER — Ambulatory Visit: Admitting: Oncology

## 2023-04-28 ENCOUNTER — Encounter: Payer: Self-pay | Admitting: Oncology

## 2023-04-28 ENCOUNTER — Other Ambulatory Visit

## 2023-04-29 ENCOUNTER — Inpatient Hospital Stay

## 2023-04-29 ENCOUNTER — Ambulatory Visit

## 2023-04-29 MED FILL — Fosaprepitant Dimeglumine For IV Infusion 150 MG (Base Eq): INTRAVENOUS | Qty: 5 | Status: AC

## 2023-05-02 ENCOUNTER — Ambulatory Visit

## 2023-05-02 ENCOUNTER — Ambulatory Visit
Admission: RE | Admit: 2023-05-02 | Discharge: 2023-05-02 | Disposition: A | Discharge status: Home / Self Care | Source: Ambulatory Visit | Attending: Radiation Oncology | Admitting: Radiation Oncology

## 2023-05-02 ENCOUNTER — Inpatient Hospital Stay

## 2023-05-02 ENCOUNTER — Other Ambulatory Visit: Payer: Self-pay

## 2023-05-02 ENCOUNTER — Telehealth: Payer: Self-pay | Admitting: Oncology

## 2023-05-02 ENCOUNTER — Encounter: Payer: Self-pay | Admitting: Oncology

## 2023-05-02 ENCOUNTER — Inpatient Hospital Stay (HOSPITAL_BASED_OUTPATIENT_CLINIC_OR_DEPARTMENT_OTHER): Admitting: Oncology

## 2023-05-02 VITALS — Ht 66.0 in

## 2023-05-02 DIAGNOSIS — C099 Malignant neoplasm of tonsil, unspecified: Secondary | ICD-10-CM

## 2023-05-02 DIAGNOSIS — Z510 Encounter for antineoplastic radiation therapy: Secondary | ICD-10-CM | POA: Diagnosis not present

## 2023-05-02 DIAGNOSIS — C210 Malignant neoplasm of anus, unspecified: Secondary | ICD-10-CM

## 2023-05-02 LAB — RAD ONC ARIA SESSION SUMMARY
Course Elapsed Days: 0
Plan Fractions Treated to Date: 1
Plan Prescribed Dose Per Fraction: 2 Gy
Plan Total Fractions Prescribed: 35
Plan Total Prescribed Dose: 70 Gy
Reference Point Dosage Given to Date: 2 Gy
Reference Point Session Dosage Given: 2 Gy
Session Number: 1

## 2023-05-02 LAB — BASIC METABOLIC PANEL - CANCER CENTER ONLY
Anion gap: 7 (ref 5–15)
BUN: 19 mg/dL (ref 8–23)
CO2: 26 mmol/L (ref 22–32)
Calcium: 9 mg/dL (ref 8.9–10.3)
Chloride: 104 mmol/L (ref 98–111)
Creatinine: 1.23 mg/dL — ABNORMAL HIGH (ref 0.44–1.00)
GFR, Estimated: 43 mL/min — ABNORMAL LOW (ref >60)
Glucose, Bld: 98 mg/dL (ref 70–99)
Potassium: 3.2 mmol/L — ABNORMAL LOW (ref 3.5–5.1)
Sodium: 137 mmol/L (ref 135–145)

## 2023-05-02 LAB — CBC WITH DIFFERENTIAL (CANCER CENTER ONLY)
Abs Immature Granulocytes: 0.01 10*3/uL (ref 0.00–0.07)
Basophils Absolute: 0 10*3/uL (ref 0.0–0.1)
Basophils Relative: 0 %
Eosinophils Absolute: 0 10*3/uL (ref 0.0–0.5)
Eosinophils Relative: 1 %
HCT: 25.4 % — ABNORMAL LOW (ref 36.0–46.0)
Hemoglobin: 8 g/dL — ABNORMAL LOW (ref 12.0–15.0)
Immature Granulocytes: 0 %
Lymphocytes Relative: 28 %
Lymphs Abs: 0.9 10*3/uL (ref 0.7–4.0)
MCH: 28.9 pg (ref 26.0–34.0)
MCHC: 31.5 g/dL (ref 30.0–36.0)
MCV: 91.7 fL (ref 80.0–100.0)
Monocytes Absolute: 0.4 10*3/uL (ref 0.1–1.0)
Monocytes Relative: 12 %
Neutro Abs: 1.9 10*3/uL (ref 1.7–7.7)
Neutrophils Relative %: 59 %
Platelet Count: 131 10*3/uL — ABNORMAL LOW (ref 150–400)
RBC: 2.77 MIL/uL — ABNORMAL LOW (ref 3.87–5.11)
RDW: 18.8 % — ABNORMAL HIGH (ref 11.5–15.5)
WBC Count: 3.2 10*3/uL — ABNORMAL LOW (ref 4.0–10.5)
nRBC: 0 % (ref 0.0–0.2)

## 2023-05-02 LAB — SAMPLE TO BLOOD BANK

## 2023-05-02 LAB — MAGNESIUM: Magnesium: 1.5 mg/dL — ABNORMAL LOW (ref 1.7–2.4)

## 2023-05-02 NOTE — Telephone Encounter (Signed)
 Called this patient to check on her due to not being at appointment. Patient states at 10:20 she is in the parking lot. I let her know that she most likely would not get treatment today due to being so late. Message sent to teams to see how to advise.   Per chat- patient is unable to get treatment but Lab and MD will see her today.   Patient notified and will come in

## 2023-05-02 NOTE — Progress Notes (Signed)
 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  INTERVAL HISTORY: Patient returns to clinic today as an add-on for further discussions of initiating treatment.  She recently has been noncompliant and not showing up to radiation or chemotherapy.  She continues to have pain, dysphagia, and a poor appetite.  She has no neurologic complaints.  She denies any recent fevers or illnesses. She has no chest pain, shortness of breath, cough, or hemoptysis.  She denies any nausea, vomiting, constipation, or diarrhea.  She has no melena or hematochezia.  She has no urinary complaints.  Patient offers no further specific complaints today.  REVIEW OF SYSTEMS:   Review of Systems  Constitutional:  Positive for malaise/fatigue. Negative for fever and weight loss.  HENT:  Positive for sore throat.   Respiratory: Negative.  Negative for cough, hemoptysis and shortness of breath.   Cardiovascular: Negative.  Negative for chest pain and leg swelling.  Gastrointestinal: Negative.  Negative for abdominal pain, blood in stool, diarrhea and melena.  Genitourinary: Negative.  Negative for dysuria.  Musculoskeletal: Negative.  Negative for back pain.  Skin: Negative.  Negative for rash.  Neurological:  Positive for weakness. Negative for dizziness, focal weakness and headaches.  Psychiatric/Behavioral: Negative.  The patient is not nervous/anxious.    As per HPI. Otherwise, a complete review  of systems is negative.  PAST MEDICAL HISTORY: Past Medical History:  Diagnosis Date   Anemia    Diabetes mellitus without complication (HCC)    Gout    Hyperlipidemia    Hypertension    Wears dentures    full upper and lower    PAST SURGICAL HISTORY: Past Surgical History:  Procedure Laterality Date   APPENDECTOMY     gallstones  06/12/2009   MICROLARYNGOSCOPY Right 01/28/2023   Procedure: MICRODIRECT LARYNGOSCOPY WITH BIOPSY OF OROPHARYNGEAL MASS;  Surgeon: Linus Salmons, MD;  Location: [facility];  Service: ENT;  Laterality: Right;  Diabetic   PORTACATH PLACEMENT N/A 02/18/2023   Procedure: INSERTION PORT-A-CATH;  Surgeon: Campbell Lerner, MD;  Location: [facility];  Service: General;  Laterality: N/A;   TONSILLECTOMY      FAMILY HISTORY: Family History  Problem Relation Age of Onset   Breast cancer Neg Hx     ADVANCED DIRECTIVES (Y/N):  N  HEALTH MAINTENANCE: Social History   Tobacco Use   Smoking status: Former    Current packs/day: 0.00    Average packs/day: 0.5 packs/day for 8.0 years (4.0 ttl pk-yrs)    Types: Cigarettes    Start date: 48    Quit date: 1990    Years since quitting: 35.2    Passive exposure: Past   Smokeless tobacco: Never  Vaping Use   Vaping status: Never Used  Substance Use Topics   Alcohol use: No    Alcohol/week: 0.0 standard drinks of alcohol   Drug use: No     Colonoscopy:  PAP:  Bone density:  Lipid panel:  No Known Allergies  Current Outpatient Medications  Medication Sig Dispense Refill   Accu-Chek  Softclix Lancets lancets      amLODipine (NORVASC) 10 MG tablet Take 10 mg by mouth daily.     aspirin 81 MG tablet Take 1 tablet by mouth daily.     capecitabine (XELODA) 500 MG tablet Take 2 tablets (1,000 mg total) by mouth 2 (two) times daily after a meal. Take Monday-Friday. Take only on days of radiation. 120 tablet 0   carvedilol (COREG) 3.125 MG tablet Take 1 tablet by mouth 2 (two) times daily. cardio      colchicine 0.6 MG tablet TAKE 1 TABLET(0.6 MG) BY MOUTH DAILY (Patient not taking: Reported on 03/21/2023) 10 tablet 1   diclofenac Sodium (VOLTAREN) 1 % GEL Research Patient: Apply 0.5 g (1 fingertip) to each hand and each foot twice daily for the duration of radiation therapy 200 g 0   diphenoxylate-atropine (LOMOTIL) 2.5-0.025 MG tablet Take 1 tablet by mouth 4 (four) times daily as needed for diarrhea or loose stools. 60 tablet 1   glucose blood (ACCU-CHEK AVIVA PLUS) test strip USE TWICE DAILY AS DIRECTED     lidocaine (XYLOCAINE) 2 % solution Use as directed 15 mLs in the mouth or throat every 6 (six) hours as needed for mouth pain. 100 mL 0   lisinopril-hydrochlorothiazide (ZESTORETIC) 20-25 MG tablet Take 1 tablet by mouth daily.     metFORMIN (GLUCOPHAGE) 500 MG tablet TAKE 1 TABLET TWICE DAILY (Patient taking differently: 500 mg. TAKE 1 TABLETin morning and 2 at night/ Dr Gershon Crane) 180 tablet 1   Morphine Sulfate (MORPHINE CONCENTRATE) 10 mg / 0.5 ml concentrated solution Take 1 mL (20 mg total) by mouth every 2 (two) hours as needed for severe pain (pain score 7-10). 15 mL 0   prochlorperazine (COMPAZINE) 10 MG tablet Take 1 tablet (10 mg total) by mouth every 6 (six) hours as needed for nausea or vomiting. 30 tablet 1   simvastatin (ZOCOR) 20 MG tablet Take 1 tablet (20 mg total) by mouth daily. 90 tablet 1   tiZANidine (ZANAFLEX) 4 MG tablet Take 1 tablet (4 mg total) by mouth at bedtime. (Patient taking differently: Take 2 mg by mouth at bedtime.) 90 tablet 1   traMADol (ULTRAM) 50 MG tablet Take 1 tablet (50 mg total) by mouth every 12 (twelve) hours as needed. 30 tablet 0   Current Facility-Administered Medications  Medication Dose Route Frequency Provider Last Rate Last Admin   albuterol (PROVENTIL) (2.5 MG/3ML) 0.083% nebulizer solution 2.5 mg  2.5 mg Nebulization Once [name], MD        OBJECTIVE: There were no vitals filed for this visit.      Body mass index is  19.84 kg/m.    ECOG FS:1 - Symptomatic but completely ambulatory  General: Well-developed, well-nourished, no acute distress. Eyes: Pink conjunctiva, anicteric sclera. HEENT: Normocephalic, moist mucous membranes. Lungs: No audible wheezing or coughing. Heart: Regular rate and rhythm. Abdomen: Soft, nontender, no obvious distention. Musculoskeletal: No edema, cyanosis, or clubbing. Neuro: Alert, answering all questions appropriately. Cranial nerves grossly intact. Skin: No rashes or petechiae noted. Psych: Normal affect.   LAB RESULTS:  Lab Results  Component Value Date   NA 137 05/02/2023   K 3.2 (L) 05/02/2023   CL 104 05/02/2023   CO2 26 05/02/2023   GLUCOSE 98 05/02/2023   BUN 19 05/02/2023   CREATININE 1.23 (H) 05/02/2023   CALCIUM 9.0 05/02/2023   PROT 6.8 04/12/2023   ALBUMIN 3.5 04/12/2023   AST 22 04/12/2023   ALT 8 04/12/2023  ALKPHOS 39 04/12/2023   BILITOT 0.9 04/12/2023   GFRNONAA 43 (L) 05/02/2023   GFRAA 59 (L) 09/24/2019    Lab Results  Component Value Date   WBC 3.2 (L) 05/02/2023   NEUTROABS 1.9 05/02/2023   HGB 8.0 (L) 05/02/2023   HCT 25.4 (L) 05/02/2023   MCV 91.7 05/02/2023   PLT 131 (L) 05/02/2023     STUDIES: NM PET Image Restag (PS) Skull Base To Thigh Result Date: 04/07/2023 CLINICAL DATA:  Subsequent treatment strategy for anorectal cancer and head/neck cancer. EXAM: NUCLEAR MEDICINE PET SKULL BASE TO THIGH TECHNIQUE: 6.97 mCi F-18 FDG was injected intravenously. Full-ring PET imaging was performed from the skull base to thigh after the radiotracer. CT data was obtained and used for attenuation correction and anatomic localization. Fasting blood glucose: 64 mg/dl COMPARISON:  PET-CT 45/40/9811 FINDINGS: Mediastinal blood pool activity: SUV max 1.98 Liver activity: SUV max NA NECK: Again demonstrated is a large oropharyngeal mass centered in the right tonsillar region. It appears to involve the epiglottis but no subglottic extension. SUV  max is 19.27 and was previously 20.7. Right-sided level 2 lymph node is slightly larger measuring 9 mm and previously measuring 7 mm. SUV max is 9.68 and was previously 3.4. No new cervical adenopathy. There are 2 hypermetabolic thyroid nodules. The left thyroid nodule has an SUV max of 9.58 and was previously 9.48. The right nodule has an SUV max of 6.58 and was previously 5.3. Incidental CT findings: Bilateral carotid artery calcifications. CHEST: No hypermetabolic mediastinal or hilar nodes. No suspicious pulmonary nodules on the CT scan. No hypermetabolic breast masses or axillary adenopathy. Incidental CT findings: Stable advanced aortic and coronary artery calcifications. No acute pulmonary findings. ABDOMEN/PELVIS: Persistent anorectal mass is difficult to measure but appears smaller. SUV max is 17.11 and was previously 22.01. No findings suspicious for abdominopelvic metastatic disease. Incidental CT findings: Stable advanced vascular calcifications. The gallbladder is surgically absent. Stable diffuse colonic diverticulosis. SKELETON: No findings for osseous metastatic disease. Incidental CT findings: None. IMPRESSION: 1. 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 It appears to involve the epiglottis but no subglottic extension. Stable level of hypermetabolism. 2. Slightly larger right-sided level 2 lymph node with increased FDG activity. 3. Persistent anorectal mass is difficult to measure but appears smaller. Slight decrease in FDG uptake. 4. Stable hypermetabolic thyroid nodules. 5. No findings for metastatic disease involving the chest, abdomen/pelvis or bony structures. 6. Stable vascular disease. 7. Aortic atherosclerosis. Electronically Signed   By: Rudie Meyer M.D.   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  PLAN:    Stage IIIa squamous cell carcinoma of the  anus: PET scan results from January 14, 2023 reviewed independently with a large anorectal lesion with significant hypermetabolism, but no hypermetabolic lymph nodes are noted.  Patient has now completed concurrent XRT along with chemotherapy.  Her last dose of mitomycin was on March 07, 2023.  She finished Xeloda and XRT on March 25, 2023.  Repeat PET scan results from April 07, 2023 reviewed independently and reported as above with persistent hypermetabolism at the site of her anal cancer, but PET scan was done sooner than required given her head and neck cancer and this may be residual inflammation/irritation from XRT.  A referral has been sent back to surgery for direct visualization.  Continue to monitor closely.  Stage III squamous cell  carcinoma of the right tonsil: PET scan results as above.  Patient is now more symptomatic with difficulty swallowing and pain.  Plan to give concurrent XRT along with weekly cisplatin.  Patient has yet to start treatment secondary to noncompliance.  Plan is return to clinic later this week to initiate cycle 1 of weekly cisplatin.   Thyroid nodule with hypermetabolism: Defer ultrasound and biopsy until completion of treatment of 2 malignancies above. Anemia: Hemoglobin is trended down to 8.0.  She will likely require transfusion in the near future.   Leukopenia: Chronic and unchanged.  Patient's total white blood cell count is 3.2. Thrombocytopenia: Platelets mildly improved to 131.  Monitor.  Hypomagnesia: Patient will receive 2 g IV magnesium with treatment later this week.   Hypokalemia: Patient will receive IV potassium along with treatment later this week. Renal insufficiency: Chronic and unchanged.  Patient's creatinine is 1.23.  Continue to monitor closely with initiation of cisplatin.  IV fluids as above. Dysphagia: Continue tramadol as needed.  Patient states she never picked up her liquid morphine.  PET scan results as above.  Patient has previously  been given referrals to dietary and speech pathology.  Patient expressed understanding and was in agreement with this plan. She also understands that She can call clinic at any time with any questions, concerns, or complaints.    Cancer Staging  Anal squamous cell carcinoma (HCC) Staging form: Anus, AJCC V9 - Clinical stage from 01/18/2023: Stage IIIA (cT3, cN0, cM0) - Signed by Jeralyn Ruths, MD on 01/18/2023 Stage prefix: Initial diagnosis  Right tonsillar squamous cell carcinoma (HCC) Staging form: Pharynx - P16 Negative Oropharynx, AJCC 8th Edition - Clinical stage from 04/12/2023: Stage III (cT3, cN1, cM0, p16: Unknown) - Signed by Jeralyn Ruths, MD on 04/12/2023 Stage prefix: Initial diagnosis   Jeralyn Ruths, MD   05/02/2023 4:30 PM

## 2023-05-03 ENCOUNTER — Ambulatory Visit

## 2023-05-03 ENCOUNTER — Other Ambulatory Visit: Payer: Self-pay

## 2023-05-03 ENCOUNTER — Other Ambulatory Visit: Payer: Self-pay | Admitting: Oncology

## 2023-05-03 ENCOUNTER — Ambulatory Visit
Admission: RE | Admit: 2023-05-03 | Discharge: 2023-05-03 | Disposition: A | Discharge status: Home / Self Care | Source: Ambulatory Visit | Attending: Radiation Oncology | Admitting: Radiation Oncology

## 2023-05-03 ENCOUNTER — Encounter: Payer: Self-pay | Admitting: Oncology

## 2023-05-03 DIAGNOSIS — Z510 Encounter for antineoplastic radiation therapy: Secondary | ICD-10-CM | POA: Diagnosis not present

## 2023-05-03 LAB — RAD ONC ARIA SESSION SUMMARY
Course Elapsed Days: 1
Plan Fractions Treated to Date: 2
Plan Prescribed Dose Per Fraction: 2 Gy
Plan Total Fractions Prescribed: 35
Plan Total Prescribed Dose: 70 Gy
Reference Point Dosage Given to Date: 4 Gy
Reference Point Session Dosage Given: 2 Gy
Session Number: 2

## 2023-05-03 MED ORDER — OXYCODONE HCL 5 MG PO TABS
5.0000 mg | ORAL_TABLET | ORAL | 0 refills | Status: DC | PRN
  Filled 2023-05-03: qty 60, 10d supply, fill #0

## 2023-05-03 MED ORDER — STERILE WATER FOR INJECTION IJ SOLN
5.0000 mL | Freq: Four times a day (QID) | ORAL | 3 refills | Status: DC | PRN
Start: 2023-05-03 — End: 2023-05-27
  Filled 2023-05-03: qty 480, 12d supply, fill #0

## 2023-05-04 ENCOUNTER — Ambulatory Visit

## 2023-05-04 ENCOUNTER — Other Ambulatory Visit: Payer: Self-pay

## 2023-05-04 ENCOUNTER — Ambulatory Visit
Admission: RE | Admit: 2023-05-04 | Discharge: 2023-05-04 | Disposition: A | Discharge status: Home / Self Care | Source: Ambulatory Visit | Attending: Radiation Oncology | Admitting: Radiation Oncology

## 2023-05-04 ENCOUNTER — Inpatient Hospital Stay

## 2023-05-04 DIAGNOSIS — Z510 Encounter for antineoplastic radiation therapy: Secondary | ICD-10-CM | POA: Diagnosis not present

## 2023-05-04 LAB — RAD ONC ARIA SESSION SUMMARY
Course Elapsed Days: 2
Plan Fractions Treated to Date: 3
Plan Prescribed Dose Per Fraction: 2 Gy
Plan Total Fractions Prescribed: 35
Plan Total Prescribed Dose: 70 Gy
Reference Point Dosage Given to Date: 6 Gy
Reference Point Session Dosage Given: 2 Gy
Session Number: 3

## 2023-05-04 MED FILL — Fosaprepitant Dimeglumine For IV Infusion 150 MG (Base Eq): INTRAVENOUS | Qty: 5 | Status: AC

## 2023-05-04 NOTE — Progress Notes (Signed)
 Nutrition Follow-up:  Patient with SCC of anus and head and neck cancer, and thyroid nodule.  Patient has completed treatment for anus cancer.   Patient has started concurrent chemotherapy and radiation treatment for head and neck cancer.    Met with patient today before radiation.  Reports that her appetite is better and she is eating well.  Yesterday ate 1 slice of cheese for breakfast.  Lunch was 1 cup of baked beans and slaw.  Supper was 2 yams and few bites of spaghetti.  Drank a glucerna shake yesterday as well along with tea and water.  Has not eaten yet today.  Drinking coffee during visit.   Says that the MMW helped her mouth.  She denies any mouth pain, dysphagia at this time.   Medications: reviewed  Labs: Mag 1.5, K 3.2, creatinine 1.23  Anthropometrics:   Weight 118 lb 4 oz today on radiation scale 117 lb on 3/4 129 lb 2 oz on 2/5 137 lb 1/10 144 lb 8 oz on 12/30 142 lb on 10/24   NUTRITION DIAGNOSIS: Unintentional weight loss stable   MALNUTRITION DIAGNOSIS: severe malnutrition continues   INTERVENTION:  Reviewed ways to add calories and protein today.  Wrote high calorie, high protein foods down for patient to include in diet that she likes.   Encouraged patient to eat q 2 hours Continue oral nutrition supplements in between meals for added nutrition Handout on soft, moist foods and high calorie, high protein diet provided Feeding tube has already been discussed with MD    MONITORING, EVALUATION, GOAL: weight trends, intake   NEXT VISIT: Thursday, April 3rd after radaition  April [name] B. Elease Hashimoto, CSO, LDN Registered Dietitian [phone]

## 2023-05-05 ENCOUNTER — Ambulatory Visit

## 2023-05-05 ENCOUNTER — Other Ambulatory Visit

## 2023-05-05 ENCOUNTER — Other Ambulatory Visit: Payer: Self-pay

## 2023-05-05 ENCOUNTER — Inpatient Hospital Stay

## 2023-05-05 ENCOUNTER — Ambulatory Visit: Admitting: Oncology

## 2023-05-05 ENCOUNTER — Inpatient Hospital Stay (HOSPITAL_BASED_OUTPATIENT_CLINIC_OR_DEPARTMENT_OTHER): Admitting: Oncology

## 2023-05-05 ENCOUNTER — Ambulatory Visit
Admission: RE | Admit: 2023-05-05 | Discharge: 2023-05-05 | Disposition: A | Discharge status: Home / Self Care | Source: Ambulatory Visit | Attending: Radiation Oncology | Admitting: Radiation Oncology

## 2023-05-05 VITALS — BP 129/46 | HR 50 | Temp 97.2°F | Resp 16

## 2023-05-05 DIAGNOSIS — C099 Malignant neoplasm of tonsil, unspecified: Secondary | ICD-10-CM

## 2023-05-05 DIAGNOSIS — C210 Malignant neoplasm of anus, unspecified: Secondary | ICD-10-CM | POA: Diagnosis not present

## 2023-05-05 DIAGNOSIS — Z510 Encounter for antineoplastic radiation therapy: Secondary | ICD-10-CM | POA: Diagnosis not present

## 2023-05-05 LAB — RAD ONC ARIA SESSION SUMMARY
Course Elapsed Days: 3
Plan Fractions Treated to Date: 4
Plan Prescribed Dose Per Fraction: 2 Gy
Plan Total Fractions Prescribed: 35
Plan Total Prescribed Dose: 70 Gy
Reference Point Dosage Given to Date: 8 Gy
Reference Point Session Dosage Given: 2 Gy
Session Number: 4

## 2023-05-05 MED ORDER — MAGNESIUM SULFATE 2 GM/50ML IV SOLN
2.0000 g | Freq: Once | INTRAVENOUS | Status: AC
  Administered 2023-05-05: 2 g via INTRAVENOUS
  Filled 2023-05-05: qty 50

## 2023-05-05 MED ORDER — SODIUM CHLORIDE 0.9 % IV SOLN: 40.0000 mg/m2 | Freq: Once | INTRAVENOUS | Status: DC

## 2023-05-05 MED ORDER — SODIUM CHLORIDE 0.9 % IV SOLN
20.0000 mg/m2 | Freq: Once | INTRAVENOUS | Status: AC
  Administered 2023-05-05: 31 mg via INTRAVENOUS
  Filled 2023-05-05: qty 31

## 2023-05-05 MED ORDER — DEXAMETHASONE SODIUM PHOSPHATE 10 MG/ML IJ SOLN
10.0000 mg | Freq: Once | INTRAMUSCULAR | Status: AC
  Administered 2023-05-05: 10 mg via INTRAVENOUS
  Filled 2023-05-05: qty 1

## 2023-05-05 MED ORDER — MORPHINE SULFATE (PF) 2 MG/ML IV SOLN
2.0000 mg | Freq: Once | INTRAVENOUS | Status: AC
  Administered 2023-05-05: 2 mg via INTRAVENOUS
  Filled 2023-05-05: qty 1

## 2023-05-05 MED ORDER — SODIUM CHLORIDE 0.9 % IV SOLN
150.0000 mg | Freq: Once | INTRAVENOUS | Status: AC
  Administered 2023-05-05: 150 mg via INTRAVENOUS
  Filled 2023-05-05 (×2): qty 5
  Filled 2023-05-05: qty 150

## 2023-05-05 MED ORDER — POTASSIUM CHLORIDE IN NACL 20-0.9 MEQ/L-% IV SOLN
Freq: Once | INTRAVENOUS | Status: AC
  Filled 2023-05-05: qty 1000

## 2023-05-05 MED ORDER — SODIUM CHLORIDE 0.9 % IV SOLN
INTRAVENOUS | Status: DC
  Filled 2023-05-05: qty 250

## 2023-05-05 MED ORDER — HEPARIN SOD (PORK) LOCK FLUSH 100 UNIT/ML IV SOLN
500.0000 [IU] | Freq: Once | INTRAVENOUS | Status: AC | PRN
Start: 2023-05-05 — End: 2023-05-05
  Administered 2023-05-05: 500 [IU]
  Filled 2023-05-05: qty 5

## 2023-05-05 MED ORDER — PALONOSETRON HCL INJECTION 0.25 MG/5ML
0.2500 mg | Freq: Once | INTRAVENOUS | Status: AC
  Administered 2023-05-05: 0.25 mg via INTRAVENOUS
  Filled 2023-05-05: qty 5

## 2023-05-05 MED ORDER — SODIUM CHLORIDE 0.9% FLUSH
10.0000 mL | INTRAVENOUS | Status: DC | PRN
  Administered 2023-05-05: 10 mL
  Filled 2023-05-05: qty 10

## 2023-05-05 NOTE — Progress Notes (Signed)
 Per Dr. Orlie Dakin  OK to continue with Cisplatin today with CrCl 29.9. Will reduce dose by 50% to 20 mg/m2    Sharen Hones, PharmD, BCPS Clinical Pharmacist

## 2023-05-05 NOTE — Progress Notes (Signed)
 MD Okay to run cisplatin with post fluids.

## 2023-05-05 NOTE — Progress Notes (Signed)
 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  INTERVAL HISTORY: Patient returns to clinic today for further evaluation and initiation of cycle 1 of weekly cisplatin.  Abnormal she is having increased jaw pain today, but otherwise feels well.  She continues to have pain, dysphagia, and a poor appetite.  She has no neurologic complaints.  She denies any recent fevers or illnesses. She has no chest pain, shortness of breath, cough, or hemoptysis.  She denies any nausea, vomiting, constipation, or diarrhea.  She has no melena or hematochezia.  She has no urinary complaints.  Patient offers no further specific complaints today.  REVIEW OF SYSTEMS:   Review of Systems  Constitutional:  Positive for malaise/fatigue. Negative for fever and weight loss.  HENT:  Positive for sore throat.   Respiratory: Negative.  Negative for cough, hemoptysis and shortness of breath.   Cardiovascular: Negative.  Negative for chest pain and leg swelling.  Gastrointestinal: Negative.  Negative for abdominal pain, blood in stool, diarrhea and melena.  Genitourinary: Negative.  Negative for dysuria.  Musculoskeletal: Negative.  Negative for back pain.  Skin: Negative.  Negative for rash.  Neurological:  Positive for weakness. Negative for dizziness, focal weakness and headaches.  Psychiatric/Behavioral: Negative.  The patient is not nervous/anxious.    As per HPI. Otherwise, a complete review  of systems is negative.  PAST MEDICAL HISTORY: Past Medical History:  Diagnosis Date   Anemia    Diabetes mellitus without complication (HCC)    Gout    Hyperlipidemia    Hypertension    Wears dentures    full upper and lower    PAST SURGICAL HISTORY: Past Surgical History:  Procedure Laterality Date   APPENDECTOMY     gallstones  06/12/2009   MICROLARYNGOSCOPY Right 01/28/2023   Procedure: MICRODIRECT LARYNGOSCOPY WITH BIOPSY OF OROPHARYNGEAL MASS;  Surgeon: Linus Salmons, MD;  Location: [facility];  Service: ENT;  Laterality: Right;  Diabetic   PORTACATH PLACEMENT N/A 02/18/2023   Procedure: INSERTION PORT-A-CATH;  Surgeon: Campbell Lerner, MD;  Location: [facility];  Service: General;  Laterality: N/A;   TONSILLECTOMY      FAMILY HISTORY: Family History  Problem Relation Age of Onset   Breast cancer Neg Hx     ADVANCED DIRECTIVES (Y/N):  N  HEALTH MAINTENANCE: Social History   Tobacco Use   Smoking status: Former    Current packs/day: 0.00    Average packs/day: 0.5 packs/day for 8.0 years (4.0 ttl pk-yrs)    Types: Cigarettes    Start date: 50    Quit date: 1990    Years since quitting: 35.2    Passive exposure: Past   Smokeless tobacco: Never  Vaping Use   Vaping status: Never Used  Substance Use Topics   Alcohol use: No    Alcohol/week: 0.0 standard drinks of alcohol   Drug use: No     Colonoscopy:  PAP:  Bone density:  Lipid panel:  No Known Allergies  Current Outpatient Medications  Medication Sig Dispense Refill  Accu-Chek Softclix Lancets lancets      amLODipine (NORVASC) 10 MG tablet Take 10 mg by mouth daily.     aspirin 81 MG tablet Take 1 tablet by mouth daily.     capecitabine (XELODA) 500 MG tablet Take 2 tablets (1,000 mg total) by mouth 2 (two) times daily after a meal. Take Monday-Friday. Take only on days of radiation. 120 tablet 0   carvedilol (COREG) 3.125 MG tablet Take 1 tablet by mouth 2 (two) times daily. cardio      colchicine 0.6 MG tablet TAKE 1 TABLET(0.6 MG) BY MOUTH DAILY (Patient not taking: Reported on 03/21/2023) 10 tablet 1   diclofenac Sodium (VOLTAREN) 1 % GEL Research Patient: Apply 0.5 g (1 fingertip) to each hand and each foot twice daily for the duration of radiation therapy 200 g 0   diphenoxylate-atropine (LOMOTIL) 2.5-0.025 MG tablet Take 1 tablet by mouth 4 (four) times daily as needed for diarrhea or loose stools. 60 tablet 1   glucose blood (ACCU-CHEK AVIVA PLUS) test strip USE TWICE DAILY AS DIRECTED     lidocaine (XYLOCAINE) 2 % solution Use as directed 15 mLs in the mouth or throat every 6 (six) hours as needed for mouth pain. 100 mL 0   lisinopril-hydrochlorothiazide (ZESTORETIC) 20-25 MG tablet Take 1 tablet by mouth daily.     magic mouthwash (multi-ingredient) oral suspension Swish and swallow 5-10 mLs 4 (four) times daily as needed. 480 mL 3   metFORMIN (GLUCOPHAGE) 500 MG tablet TAKE 1 TABLET TWICE DAILY (Patient taking differently: 500 mg. TAKE 1 TABLETin morning and 2 at night/ Dr Gershon Crane) 180 tablet 1   Morphine Sulfate (MORPHINE CONCENTRATE) 10 mg / 0.5 ml concentrated solution Take 1 mL (20 mg total) by mouth every 2 (two) hours as needed for severe pain (pain score 7-10). 15 mL 0   oxyCODONE (OXY IR/ROXICODONE) 5 MG immediate release tablet Take 1 tablet (5 mg total) by mouth every 4 (four) hours as needed for severe pain (pain score 7-10). 60 tablet 0   prochlorperazine (COMPAZINE) 10 MG tablet Take 1 tablet (10 mg total) by mouth every 6 (six) hours as needed for nausea or vomiting. 30 tablet 1   simvastatin (ZOCOR) 20 MG tablet Take 1 tablet (20 mg total) by mouth daily. 90 tablet 1   tiZANidine (ZANAFLEX) 4 MG tablet Take 1 tablet (4 mg total) by mouth at bedtime. (Patient taking differently: Take 2 mg by mouth at bedtime.) 90 tablet 1   traMADol (ULTRAM) 50 MG tablet Take 1 tablet (50 mg total) by mouth every 12 (twelve) hours as needed. 30 tablet 0   Current  Facility-Administered Medications  Medication Dose Route Frequency Provider Last Rate Last Admin   albuterol (PROVENTIL) (2.5 MG/3ML) 0.083% nebulizer solution 2.5 mg  2.5 mg Nebulization Once [name], MD       Facility-Administered Medications Ordered in Other Visits  Medication Dose Route Frequency Provider Last Rate Last Admin   0.9 %  sodium chloride infusion   Intravenous Continuous Jeralyn Ruths, MD 10 mL/hr at 05/05/23 0927 New Bag at 05/05/23 0927   CISplatin (PLATINOL) 31 mg in sodium chloride 0.9 % 250 mL chemo infusion  20 mg/m2 (Treatment Plan Recorded) Intravenous Once Jeralyn Ruths, MD       dexamethasone (DECADRON) injection 10 mg  10 mg Intravenous Once Jeralyn Ruths, MD       fosaprepitant (EMEND) 150 mg in sodium chloride 0.9 % 145 mL IVPB  150 mg Intravenous Once Jeralyn Ruths, MD       heparin lock flush 100 unit/mL  500 Units Intracatheter Once PRN Jeralyn Ruths, MD       magnesium sulfate IVPB 2 g 50 mL  2 g Intravenous Once Jeralyn Ruths, MD 50 mL/hr at 05/05/23 0927 2 g at 05/05/23 1610   palonosetron (ALOXI) injection 0.25 mg  0.25 mg Intravenous Once Jeralyn Ruths, MD       sodium chloride flush (NS) 0.9 % injection 10 mL  10 mL Intracatheter PRN Jeralyn Ruths, MD        OBJECTIVE: There were no vitals filed for this visit.      There is no height or weight on file to calculate BMI.    ECOG FS:1 - Symptomatic but completely ambulatory  General: Well-developed, well-nourished, no acute distress. Eyes: Pink conjunctiva, anicteric sclera. HEENT: Normocephalic, moist mucous membranes. Lungs: No audible wheezing or coughing. Heart: Regular rate and rhythm. Abdomen: Soft, nontender, no obvious distention. Musculoskeletal: No edema, cyanosis, or clubbing. Neuro: Alert, answering all questions appropriately. Cranial nerves grossly intact. Skin: No rashes or petechiae noted. Psych: Normal affect.   LAB  RESULTS:  Lab Results  Component Value Date   NA 137 05/02/2023   K 3.2 (L) 05/02/2023   CL 104 05/02/2023   CO2 26 05/02/2023   GLUCOSE 98 05/02/2023   BUN 19 05/02/2023   CREATININE 1.23 (H) 05/02/2023   CALCIUM 9.0 05/02/2023   PROT 6.8 04/12/2023   ALBUMIN 3.5 04/12/2023   AST 22 04/12/2023   ALT 8 04/12/2023   ALKPHOS 39 04/12/2023   BILITOT 0.9 04/12/2023   GFRNONAA 43 (L) 05/02/2023   GFRAA 59 (L) 09/24/2019    Lab Results  Component Value Date   WBC 3.2 (L) 05/02/2023   NEUTROABS 1.9 05/02/2023   HGB 8.0 (L) 05/02/2023   HCT 25.4 (L) 05/02/2023   MCV 91.7 05/02/2023   PLT 131 (L) 05/02/2023     STUDIES: NM PET Image Restag (PS) Skull Base To Thigh Result Date: 04/07/2023 CLINICAL DATA:  Subsequent treatment strategy for anorectal cancer and head/neck cancer. EXAM: NUCLEAR MEDICINE PET SKULL BASE TO THIGH TECHNIQUE: 6.97 mCi F-18 FDG was injected intravenously. Full-ring PET imaging was performed from the skull base to thigh after the radiotracer. CT data was obtained and used for attenuation correction and anatomic localization. Fasting blood glucose: 64 mg/dl COMPARISON:  PET-CT 96/05/5407 FINDINGS: Mediastinal blood pool activity: SUV max 1.98 Liver activity: SUV max NA NECK: Again demonstrated is a large oropharyngeal mass centered in the right tonsillar region. It appears to involve the epiglottis but no subglottic extension. SUV max is 19.27 and was previously 20.7. Right-sided level 2 lymph node is slightly larger measuring 9 mm and previously measuring 7 mm. SUV max is 9.68 and was previously 3.4. No new cervical adenopathy. There are 2 hypermetabolic thyroid nodules. The left thyroid nodule has an SUV max of 9.58 and was previously 9.48. The right nodule has an SUV max of 6.58 and was previously 5.3. Incidental CT findings: Bilateral carotid artery calcifications. CHEST: No hypermetabolic mediastinal or hilar nodes. No suspicious pulmonary nodules on the CT scan. No  hypermetabolic breast masses or axillary adenopathy. Incidental CT findings: Stable advanced aortic and coronary artery calcifications. No acute pulmonary findings. ABDOMEN/PELVIS: Persistent anorectal mass is difficult to measure but appears smaller. SUV max is 17.11 and was previously 22.01. No findings suspicious for abdominopelvic metastatic disease. Incidental CT findings:  Stable advanced vascular calcifications. The gallbladder is surgically absent. Stable diffuse colonic diverticulosis. SKELETON: No findings for osseous metastatic disease. Incidental CT findings: None. IMPRESSION: 1. 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 It appears to involve the epiglottis but no subglottic extension. Stable level of hypermetabolism. 2. Slightly larger right-sided level 2 lymph node with increased FDG activity. 3. Persistent anorectal mass is difficult to measure but appears smaller. Slight decrease in FDG uptake. 4. Stable hypermetabolic thyroid nodules. 5. No findings for metastatic disease involving the chest, abdomen/pelvis or bony structures. 6. Stable vascular disease. 7. Aortic atherosclerosis. Electronically Signed   By: Rudie Meyer M.D.   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  Patient has now completed concurrent XRT along with chemotherapy.  Her last dose of mitomycin was on March 07, 2023.  She finished Xeloda and XRT on March 25, 2023.  Repeat PET scan results from April 07, 2023 reviewed independently and reported as above with persistent hypermetabolism at the site of her anal cancer, but PET scan was done sooner than required  given her head and neck cancer and this may be residual inflammation/irritation from XRT.  A referral has been sent back to surgery for direct visualization.  Continue to monitor closely.  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  Patient is now more symptomatic with difficulty swallowing and pain.  Plan to give concurrent XRT along with weekly cisplatin.  Patient missed multiple appointments in the interim, but recently started XRT this week.  Proceed with cycle 1 of cisplatin today.  Treatment has been dose reduced 50% since her GFR is less than 45.  Return to clinic daily for XRT and then in 1 week for further evaluation and consideration of cycle 2 of cisplatin.   Thyroid nodule with hypermetabolism: Defer ultrasound and biopsy until completion of treatment of 2 malignancies above. Anemia: Hemoglobin is trended down to 8.0.  She will likely require transfusion in the near future.  Monitor. Leukopenia: Chronic and unchanged.  Patient's total white blood cell count is 3.2. Thrombocytopenia: Platelets mildly improved to 131.  Monitor.  Hypomagnesia: Patient will receive 2 g IV magnesium with treatment later this week.   Hypokalemia: Patient will receive IV potassium along with treatment later this week. Renal insufficiency: Chronic and unchanged.  Patient's creatinine is 1.23.  Continue to monitor closely with initiation of cisplatin.  IV fluids as above. Dysphagia: Continue tramadol as needed.  Patient was given a prescription for 5 mg oxycodone.  She also received 2 mg IV morphine during treatment today.  I spent a total of 30 minutes reviewing chart data, face-to-face evaluation with the patient, counseling and coordination of care as detailed above.   Patient expressed understanding and was in agreement with this plan. She also understands that She can call clinic at any time with any questions, concerns, or complaints.    Cancer Staging  Anal squamous  cell carcinoma (HCC) Staging form: Anus, AJCC V9 - Clinical stage from 01/18/2023: Stage IIIA (cT3, cN0, cM0) - Signed by Jeralyn Ruths, MD on 01/18/2023 Stage prefix: Initial diagnosis  Right tonsillar squamous  cell carcinoma (HCC) Staging form: Pharynx - P16 Negative Oropharynx, AJCC 8th Edition - Clinical stage from 04/12/2023: Stage III (cT3, cN1, cM0, p16: Unknown) - Signed by Jeralyn Ruths, MD on 04/12/2023 Stage prefix: Initial diagnosis   Jeralyn Ruths, MD   05/05/2023 9:53 AM

## 2023-05-06 ENCOUNTER — Other Ambulatory Visit: Payer: Self-pay

## 2023-05-06 ENCOUNTER — Ambulatory Visit
Admission: RE | Admit: 2023-05-06 | Discharge: 2023-05-06 | Disposition: A | Discharge status: Home / Self Care | Source: Ambulatory Visit | Attending: Radiation Oncology | Admitting: Radiation Oncology

## 2023-05-06 ENCOUNTER — Encounter: Payer: Self-pay | Admitting: Oncology

## 2023-05-06 ENCOUNTER — Ambulatory Visit

## 2023-05-06 DIAGNOSIS — Z510 Encounter for antineoplastic radiation therapy: Secondary | ICD-10-CM | POA: Diagnosis not present

## 2023-05-06 LAB — RAD ONC ARIA SESSION SUMMARY
Course Elapsed Days: 4
Plan Fractions Treated to Date: 5
Plan Prescribed Dose Per Fraction: 2 Gy
Plan Total Fractions Prescribed: 35
Plan Total Prescribed Dose: 70 Gy
Reference Point Dosage Given to Date: 10 Gy
Reference Point Session Dosage Given: 2 Gy
Session Number: 5

## 2023-05-09 ENCOUNTER — Inpatient Hospital Stay: Admitting: Nurse Practitioner

## 2023-05-09 ENCOUNTER — Inpatient Hospital Stay

## 2023-05-09 ENCOUNTER — Ambulatory Visit
Admission: RE | Admit: 2023-05-09 | Discharge: 2023-05-09 | Disposition: A | Discharge status: Home / Self Care | Source: Ambulatory Visit | Attending: Radiation Oncology | Admitting: Radiation Oncology

## 2023-05-09 ENCOUNTER — Ambulatory Visit

## 2023-05-09 ENCOUNTER — Other Ambulatory Visit: Payer: Self-pay

## 2023-05-09 DIAGNOSIS — Z510 Encounter for antineoplastic radiation therapy: Secondary | ICD-10-CM | POA: Diagnosis not present

## 2023-05-09 LAB — RAD ONC ARIA SESSION SUMMARY
Course Elapsed Days: 7
Plan Fractions Treated to Date: 6
Plan Prescribed Dose Per Fraction: 2 Gy
Plan Total Fractions Prescribed: 35
Plan Total Prescribed Dose: 70 Gy
Reference Point Dosage Given to Date: 12 Gy
Reference Point Session Dosage Given: 2 Gy
Session Number: 6

## 2023-05-10 ENCOUNTER — Other Ambulatory Visit: Payer: Self-pay

## 2023-05-10 ENCOUNTER — Ambulatory Visit
Admission: RE | Admit: 2023-05-10 | Discharge: 2023-05-10 | Disposition: A | Discharge status: Home / Self Care | Source: Ambulatory Visit | Attending: Radiation Oncology | Admitting: Radiation Oncology

## 2023-05-10 ENCOUNTER — Other Ambulatory Visit: Payer: Self-pay | Admitting: Family Medicine

## 2023-05-10 ENCOUNTER — Ambulatory Visit

## 2023-05-10 DIAGNOSIS — Z79899 Other long term (current) drug therapy: Secondary | ICD-10-CM | POA: Diagnosis not present

## 2023-05-10 DIAGNOSIS — I700 Atherosclerosis of aorta: Secondary | ICD-10-CM | POA: Insufficient documentation

## 2023-05-10 DIAGNOSIS — E042 Nontoxic multinodular goiter: Secondary | ICD-10-CM | POA: Insufficient documentation

## 2023-05-10 DIAGNOSIS — Z923 Personal history of irradiation: Secondary | ICD-10-CM | POA: Insufficient documentation

## 2023-05-10 DIAGNOSIS — C20 Malignant neoplasm of rectum: Secondary | ICD-10-CM | POA: Diagnosis not present

## 2023-05-10 DIAGNOSIS — Z791 Long term (current) use of non-steroidal anti-inflammatories (NSAID): Secondary | ICD-10-CM | POA: Insufficient documentation

## 2023-05-10 DIAGNOSIS — E041 Nontoxic single thyroid nodule: Secondary | ICD-10-CM | POA: Insufficient documentation

## 2023-05-10 DIAGNOSIS — C210 Malignant neoplasm of anus, unspecified: Secondary | ICD-10-CM | POA: Insufficient documentation

## 2023-05-10 DIAGNOSIS — E785 Hyperlipidemia, unspecified: Secondary | ICD-10-CM | POA: Diagnosis not present

## 2023-05-10 DIAGNOSIS — R5383 Other fatigue: Secondary | ICD-10-CM | POA: Insufficient documentation

## 2023-05-10 DIAGNOSIS — N289 Disorder of kidney and ureter, unspecified: Secondary | ICD-10-CM | POA: Diagnosis not present

## 2023-05-10 DIAGNOSIS — E119 Type 2 diabetes mellitus without complications: Secondary | ICD-10-CM | POA: Insufficient documentation

## 2023-05-10 DIAGNOSIS — I119 Hypertensive heart disease without heart failure: Secondary | ICD-10-CM | POA: Insufficient documentation

## 2023-05-10 DIAGNOSIS — E7849 Other hyperlipidemia: Secondary | ICD-10-CM

## 2023-05-10 DIAGNOSIS — E876 Hypokalemia: Secondary | ICD-10-CM | POA: Insufficient documentation

## 2023-05-10 DIAGNOSIS — M109 Gout, unspecified: Secondary | ICD-10-CM | POA: Diagnosis not present

## 2023-05-10 DIAGNOSIS — Z7982 Long term (current) use of aspirin: Secondary | ICD-10-CM | POA: Insufficient documentation

## 2023-05-10 DIAGNOSIS — C099 Malignant neoplasm of tonsil, unspecified: Secondary | ICD-10-CM | POA: Diagnosis not present

## 2023-05-10 DIAGNOSIS — R197 Diarrhea, unspecified: Secondary | ICD-10-CM | POA: Diagnosis not present

## 2023-05-10 DIAGNOSIS — Z87891 Personal history of nicotine dependence: Secondary | ICD-10-CM | POA: Diagnosis not present

## 2023-05-10 DIAGNOSIS — C109 Malignant neoplasm of oropharynx, unspecified: Secondary | ICD-10-CM | POA: Insufficient documentation

## 2023-05-10 DIAGNOSIS — D696 Thrombocytopenia, unspecified: Secondary | ICD-10-CM | POA: Insufficient documentation

## 2023-05-10 DIAGNOSIS — Z7984 Long term (current) use of oral hypoglycemic drugs: Secondary | ICD-10-CM | POA: Insufficient documentation

## 2023-05-10 DIAGNOSIS — D72819 Decreased white blood cell count, unspecified: Secondary | ICD-10-CM | POA: Diagnosis not present

## 2023-05-10 DIAGNOSIS — Z510 Encounter for antineoplastic radiation therapy: Secondary | ICD-10-CM | POA: Diagnosis present

## 2023-05-10 DIAGNOSIS — R6884 Jaw pain: Secondary | ICD-10-CM | POA: Insufficient documentation

## 2023-05-10 DIAGNOSIS — R1310 Dysphagia, unspecified: Secondary | ICD-10-CM | POA: Insufficient documentation

## 2023-05-10 DIAGNOSIS — Z5111 Encounter for antineoplastic chemotherapy: Secondary | ICD-10-CM | POA: Insufficient documentation

## 2023-05-10 DIAGNOSIS — D649 Anemia, unspecified: Secondary | ICD-10-CM | POA: Diagnosis not present

## 2023-05-10 DIAGNOSIS — I10 Essential (primary) hypertension: Secondary | ICD-10-CM | POA: Insufficient documentation

## 2023-05-10 LAB — RAD ONC ARIA SESSION SUMMARY
Course Elapsed Days: 8
Plan Fractions Treated to Date: 7
Plan Prescribed Dose Per Fraction: 2 Gy
Plan Total Fractions Prescribed: 35
Plan Total Prescribed Dose: 70 Gy
Reference Point Dosage Given to Date: 14 Gy
Reference Point Session Dosage Given: 2 Gy
Session Number: 7

## 2023-05-11 ENCOUNTER — Other Ambulatory Visit: Payer: Self-pay

## 2023-05-11 ENCOUNTER — Ambulatory Visit

## 2023-05-11 ENCOUNTER — Ambulatory Visit
Admission: RE | Admit: 2023-05-11 | Discharge: 2023-05-11 | Disposition: A | Discharge status: Home / Self Care | Source: Ambulatory Visit | Attending: Radiation Oncology | Admitting: Radiation Oncology

## 2023-05-11 DIAGNOSIS — Z510 Encounter for antineoplastic radiation therapy: Secondary | ICD-10-CM | POA: Diagnosis not present

## 2023-05-11 LAB — RAD ONC ARIA SESSION SUMMARY
Course Elapsed Days: 9
Plan Fractions Treated to Date: 8
Plan Prescribed Dose Per Fraction: 2 Gy
Plan Total Fractions Prescribed: 35
Plan Total Prescribed Dose: 70 Gy
Reference Point Dosage Given to Date: 16 Gy
Reference Point Session Dosage Given: 2 Gy
Session Number: 8

## 2023-05-12 ENCOUNTER — Ambulatory Visit

## 2023-05-12 ENCOUNTER — Other Ambulatory Visit

## 2023-05-12 ENCOUNTER — Ambulatory Visit
Admission: RE | Admit: 2023-05-12 | Discharge: 2023-05-12 | Disposition: A | Discharge status: Home / Self Care | Source: Ambulatory Visit | Attending: Radiation Oncology | Admitting: Radiation Oncology

## 2023-05-12 ENCOUNTER — Other Ambulatory Visit: Payer: Self-pay

## 2023-05-12 ENCOUNTER — Ambulatory Visit (INDEPENDENT_AMBULATORY_CARE_PROVIDER_SITE_OTHER): Payer: Self-pay | Admitting: Family Medicine

## 2023-05-12 ENCOUNTER — Ambulatory Visit: Admitting: Oncology

## 2023-05-12 ENCOUNTER — Encounter: Payer: Self-pay | Admitting: Family Medicine

## 2023-05-12 ENCOUNTER — Inpatient Hospital Stay

## 2023-05-12 VITALS — BP 108/64 | HR 68 | Ht 66.0 in | Wt 120.2 lb

## 2023-05-12 DIAGNOSIS — E119 Type 2 diabetes mellitus without complications: Secondary | ICD-10-CM | POA: Diagnosis not present

## 2023-05-12 DIAGNOSIS — E7849 Other hyperlipidemia: Secondary | ICD-10-CM

## 2023-05-12 DIAGNOSIS — I10 Essential (primary) hypertension: Secondary | ICD-10-CM

## 2023-05-12 DIAGNOSIS — Z7984 Long term (current) use of oral hypoglycemic drugs: Secondary | ICD-10-CM | POA: Diagnosis not present

## 2023-05-12 DIAGNOSIS — Z510 Encounter for antineoplastic radiation therapy: Secondary | ICD-10-CM | POA: Diagnosis not present

## 2023-05-12 LAB — RAD ONC ARIA SESSION SUMMARY
Course Elapsed Days: 10
Plan Fractions Treated to Date: 9
Plan Prescribed Dose Per Fraction: 2 Gy
Plan Total Fractions Prescribed: 35
Plan Total Prescribed Dose: 70 Gy
Reference Point Dosage Given to Date: 18 Gy
Reference Point Session Dosage Given: 2 Gy
Session Number: 9

## 2023-05-12 MED ORDER — LISINOPRIL 5 MG PO TABS
5.0000 mg | ORAL_TABLET | Freq: Every day | ORAL | 0 refills | Status: DC
Start: 2023-05-12 — End: 2023-05-16

## 2023-05-12 MED ORDER — METFORMIN HCL 500 MG PO TABS: ORAL_TABLET | ORAL | 1 refills | Status: DC

## 2023-05-12 MED FILL — Fosaprepitant Dimeglumine For IV Infusion 150 MG (Base Eq): INTRAVENOUS | Qty: 5 | Status: AC

## 2023-05-12 NOTE — Progress Notes (Signed)
 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  Patient undergone radiation therapy particularly to the anal area with concurrent chemotherapy.  Patient is followed by oncology. Hypertension This is a chronic problem. The current episode started more than 1 year ago. The problem has been gradually improving since onset. The problem is controlled. Pertinent negatives include no blurred vision, chest pain, headaches, palpitations or shortness of breath. There are no associated agents to hypertension. There are no known risk factors for coronary artery disease. Past treatments include ACE inhibitors, diuretics, calcium channel blockers and alpha 1 blockers. The current treatment provides moderate improvement. There are no compliance problems.  There is no history of CAD/MI or CVA. There is no history of chronic renal disease, a hypertension causing med or renovascular disease.  Hyperlipidemia This is a chronic problem. The current episode started more than 1 year ago. The problem is controlled. She has no history of chronic renal disease. Pertinent negatives include no chest pain or shortness of breath. Current antihyperlipidemic treatment includes statins. The current treatment provides moderate improvement of lipids. There are no compliance problems.   Diabetes She presents for her follow-up diabetic visit. She has type 2 diabetes mellitus. Pertinent negatives for hypoglycemia include no dizziness, headaches or nervousness/anxiousness. Pertinent negatives for diabetes include no blurred vision, no chest pain, no foot paresthesias, no polydipsia, no polyuria and no weakness. Symptoms are stable. Pertinent negatives for diabetic complications include no CVA.    Lab Results  Component Value Date   NA 137 05/02/2023   K 3.2 (L) 05/02/2023   CO2 26 05/02/2023    GLUCOSE 98 05/02/2023   BUN 19 05/02/2023   CREATININE 1.23 (H) 05/02/2023   CALCIUM 9.0 05/02/2023   EGFR 48 (L) 06/01/2022   GFRNONAA 43 (L) 05/02/2023   Lab Results  Component Value Date   CHOL 128 12/02/2022   HDL 47 12/02/2022   LDLCALC 65 12/02/2022   TRIG 84 12/02/2022   CHOLHDL 3.1 09/24/2019   Lab Results  Component Value Date   TSH 0.98 07/24/2021   Lab Results  Component Value Date   HGBA1C 6.6 04/13/2022   Lab Results  Component Value Date   WBC 3.2 (L) 05/02/2023   HGB 8.0 (L) 05/02/2023   HCT 25.4 (L) 05/02/2023   MCV 91.7 05/02/2023   PLT 131 (L) 05/02/2023   Lab Results  Component Value Date   ALT 8 04/12/2023   AST 22 04/12/2023   ALKPHOS 39 04/12/2023   BILITOT 0.9 04/12/2023   No results found for: "25OHVITD2", "25OHVITD3", "VD25OH"   Review of Systems  HENT:  Negative for trouble swallowing.   Eyes:  Negative for blurred vision.  Respiratory:  Negative for cough, shortness of breath and wheezing.   Cardiovascular:  Negative for chest pain, palpitations and leg swelling.  Gastrointestinal:  Negative for abdominal pain and blood in stool.  Endocrine: Negative for polydipsia and polyuria.  Genitourinary:  Negative for difficulty urinating, hematuria and vaginal bleeding.  Neurological:  Negative for dizziness, weakness and headaches.  Psychiatric/Behavioral:  The patient is not nervous/anxious.     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   Essential hypertension 06/20/2014   Routine sports examination 06/20/2014   Health examination of defined subpopulation 06/20/2014   Hyperlipidemia due to type 2 diabetes mellitus (HCC) 06/20/2014    No Known Allergies  Past Surgical History:  Procedure Laterality Date   APPENDECTOMY     gallstones  06/12/2009   MICROLARYNGOSCOPY Right 01/28/2023   Procedure: MICRODIRECT LARYNGOSCOPY WITH BIOPSY OF OROPHARYNGEAL MASS;  Surgeon: Linus Salmons, MD;  Location: [facility];  Service: ENT;  Laterality: Right;  Diabetic   PORTACATH PLACEMENT N/A 02/18/2023   Procedure: INSERTION PORT-A-CATH;  Surgeon: Campbell Lerner, MD;  Location: [facility];  Service: General;  Laterality: N/A;   TONSILLECTOMY      Social History   Tobacco Use   Smoking status: Former    Current packs/day: 0.00    Average packs/day: 0.5 packs/day for 8.0 years (4.0 ttl pk-yrs)    Types: Cigarettes    Start date: 60    Quit date: 45    Years since quitting: 35.2    Passive exposure: Past   Smokeless tobacco: Never  Vaping Use   Vaping status: Never Used  Substance Use Topics   Alcohol use: No    Alcohol/week: 0.0 standard drinks of alcohol   Drug use: No     Medication list has been reviewed and updated.  Current Meds  Medication Sig   Accu-Chek Softclix Lancets lancets    amLODipine (NORVASC) 10 MG tablet Take 10 mg by mouth daily.   aspirin 81 MG tablet Take 1 tablet by mouth daily.   capecitabine (XELODA) 500 MG tablet Take 2 tablets (1,000 mg total) by mouth 2 (two) times daily after a meal. Take Monday-Friday. Take only on days of radiation.   carvedilol (COREG) 3.125 MG tablet Take 1 tablet by mouth 2 (two) times daily. cardio   colchicine 0.6 MG tablet TAKE 1 TABLET(0.6 MG) BY MOUTH DAILY   glucose blood (ACCU-CHEK AVIVA PLUS) test strip USE TWICE DAILY AS DIRECTED   lisinopril (ZESTRIL) 5 MG tablet Take 1 tablet (5 mg total) by mouth  daily.   tiZANidine (ZANAFLEX) 4 MG tablet Take 1 tablet (4 mg total) by mouth at bedtime. (Patient taking differently: Take 2 mg by mouth at bedtime.)   traMADol (ULTRAM) 50 MG tablet Take 1 tablet (50 mg total) by mouth every 12 (twelve) hours as needed.   [DISCONTINUED] lisinopril-hydrochlorothiazide (ZESTORETIC) 20-25 MG tablet Take 1 tablet by mouth daily.   [DISCONTINUED] metFORMIN (GLUCOPHAGE) 500 MG tablet TAKE 1 TABLET TWICE DAILY (Patient taking differently: 500 mg. TAKE 1 TABLETin morning and 2 at night/ Dr Gershon Crane)   [DISCONTINUED] simvastatin (ZOCOR) 20 MG tablet TAKE 1 TABLET EVERY DAY   Current Facility-Administered Medications for the 05/12/23 encounter (Office Visit) with [name], MD  Medication   albuterol (PROVENTIL) (2.5 MG/3ML) 0.083% nebulizer solution 2.5 mg       05/12/2023   10:14 AM 01/04/2023   10:44 AM 12/02/2022    9:44 AM 08/27/2022    3:03 PM  GAD 7 : Generalized Anxiety Score  Nervous, Anxious, on Edge 0 0 0 0  Control/stop worrying 0 0 0 0  Worry too much - different things  0 0 0  Trouble relaxing  0 0 0  Restless  0 0 0  Easily annoyed or irritable  0 0 0  Afraid - awful might happen  0 0 0  Total GAD 7 Score  0 0 0  Anxiety Difficulty  Not difficult at all Not difficult at all Not difficult at all       05/12/2023   10:14 AM 01/04/2023   10:44 AM 12/02/2022    9:43 AM  Depression screen PHQ 2/9  Decreased Interest 0 0 0  Down, Depressed, Hopeless 0 0 0  PHQ - 2 Score 0 0 0  Altered sleeping  0 0  Tired, decreased energy  0 0  Change in appetite  0 0  Feeling bad or failure about yourself   0 0  Trouble concentrating  0 0  Moving slowly or fidgety/restless  0 0  Suicidal thoughts  0 0  PHQ-9 Score  0 0  Difficult doing work/chores  Not difficult at all Not difficult at all    BP Readings from Last 3 Encounters:  05/12/23 108/64  05/05/23 (!) 129/46  04/25/23 (!) 133/58    Physical Exam Vitals and nursing note reviewed.   Constitutional:      General: She is not in acute distress.    Appearance: She is not diaphoretic.  HENT:     Head: Normocephalic and atraumatic.     Right Ear: External ear normal.     Left Ear: External ear normal.     Nose: Nose normal.  Eyes:     General:        Right eye: No discharge.        Left eye: No discharge.     Conjunctiva/sclera: Conjunctivae normal.     Pupils: Pupils are equal, round, and reactive to light.  Neck:     Thyroid: No thyromegaly.     Vascular: No JVD.  Cardiovascular:     Rate and Rhythm: Normal rate and regular rhythm.     Heart sounds: Normal heart sounds. No murmur heard.    No friction rub. No gallop.  Pulmonary:     Effort: Pulmonary effort is normal.     Breath sounds: Normal breath sounds.  Abdominal:     General: Bowel sounds are normal.     Palpations: Abdomen is soft. There is no mass.     Tenderness: There is no abdominal tenderness. There is no guarding.  Musculoskeletal:        General: Normal range of motion.     Cervical back: Normal range of motion and neck supple.  Lymphadenopathy:     Cervical: No cervical adenopathy.  Skin:    General: Skin is warm and dry.  Neurological:     Mental Status: She is alert.     Deep Tendon Reflexes: Reflexes are normal and symmetric.     Wt Readings from Last 3 Encounters:  05/12/23 120 lb 4 oz (54.5 kg)  04/25/23 122 lb 14.4 oz (55.7 kg)  04/12/23 117 lb (53.1 kg)    BP 108/64   Pulse 68   Ht 5\' 6"  (1.676 m)   Wt 120 lb 4 oz (54.5 kg)   SpO2 98%   BMI 19.41 kg/m   Assessment and Plan:  Patient has stage IV squamous cell carcinoma from suspected primary of oropharyngeal.  She is undergoing radiation therapy to the anal area with concurrent chemotherapy.  1. Essential hypertension (Primary) Chronic.  Controlled.  Stable.  I think patient is followed by cardiology but I cannot verify from her history.  Asymptomatic.  Not orthostatic.  Blood pressure is 108/64.  My concern is  that patient is dehydrated and is on a diuretic which may be decreasing her ejection fraction so I will discontinue the hydrochlorothiazide and we will lower the 20 mg lisinopril to 5 mg to maintain protection of the kidneys from the diabetes and facilitate preload reduction to the heart.  Will recheck blood pressure on Monday.  2. Type 2 diabetes mellitus without complication, without long-term current use of insulin (HCC) Chronic.  Controlled.  Stable.  Patient is currently on metformin from myself and I suspect this followed by endocrinology.  We will discontinue the evening dose of 1 g metformin and continue metformin 500 mg every morning.  Will recheck patient fasting glucose on Monday. - metFORMIN (GLUCOPHAGE) 500 MG tablet; One every morning  Dispense: 90 tablet; Refill: 1  3. Familial multiple lipoprotein-type hyperlipidemia Chronic.  Controlled.  Stable.  Patient is currently on simvastatin 20 mg and at this point in time since dietary intake is hit or miss due to the chemotherapy we will discontinue the simvastatin will recheck LDL at a later date but at this point in time I think the least of her worries is diabetic and lipid control given her relative cachectic circumstance.  [name], MD

## 2023-05-13 ENCOUNTER — Inpatient Hospital Stay (HOSPITAL_BASED_OUTPATIENT_CLINIC_OR_DEPARTMENT_OTHER): Admitting: Nurse Practitioner

## 2023-05-13 ENCOUNTER — Ambulatory Visit
Admission: RE | Admit: 2023-05-13 | Discharge: 2023-05-13 | Disposition: A | Discharge status: Home / Self Care | Source: Ambulatory Visit | Attending: Radiation Oncology | Admitting: Radiation Oncology

## 2023-05-13 ENCOUNTER — Encounter: Payer: Self-pay | Admitting: Nurse Practitioner

## 2023-05-13 ENCOUNTER — Inpatient Hospital Stay

## 2023-05-13 ENCOUNTER — Encounter: Payer: Self-pay | Admitting: Oncology

## 2023-05-13 ENCOUNTER — Ambulatory Visit

## 2023-05-13 ENCOUNTER — Other Ambulatory Visit: Payer: Self-pay

## 2023-05-13 VITALS — BP 147/56 | HR 57 | Temp 96.9°F | Resp 14 | Wt 114.0 lb

## 2023-05-13 DIAGNOSIS — C210 Malignant neoplasm of anus, unspecified: Secondary | ICD-10-CM

## 2023-05-13 DIAGNOSIS — R1310 Dysphagia, unspecified: Secondary | ICD-10-CM | POA: Insufficient documentation

## 2023-05-13 DIAGNOSIS — D649 Anemia, unspecified: Secondary | ICD-10-CM | POA: Insufficient documentation

## 2023-05-13 DIAGNOSIS — E119 Type 2 diabetes mellitus without complications: Secondary | ICD-10-CM | POA: Insufficient documentation

## 2023-05-13 DIAGNOSIS — Z791 Long term (current) use of non-steroidal anti-inflammatories (NSAID): Secondary | ICD-10-CM | POA: Insufficient documentation

## 2023-05-13 DIAGNOSIS — Z5111 Encounter for antineoplastic chemotherapy: Secondary | ICD-10-CM

## 2023-05-13 DIAGNOSIS — M109 Gout, unspecified: Secondary | ICD-10-CM | POA: Insufficient documentation

## 2023-05-13 DIAGNOSIS — D72819 Decreased white blood cell count, unspecified: Secondary | ICD-10-CM | POA: Insufficient documentation

## 2023-05-13 DIAGNOSIS — C760 Malignant neoplasm of head, face and neck: Secondary | ICD-10-CM

## 2023-05-13 DIAGNOSIS — E041 Nontoxic single thyroid nodule: Secondary | ICD-10-CM | POA: Insufficient documentation

## 2023-05-13 DIAGNOSIS — E876 Hypokalemia: Secondary | ICD-10-CM | POA: Insufficient documentation

## 2023-05-13 DIAGNOSIS — Z7982 Long term (current) use of aspirin: Secondary | ICD-10-CM | POA: Insufficient documentation

## 2023-05-13 DIAGNOSIS — Z510 Encounter for antineoplastic radiation therapy: Secondary | ICD-10-CM | POA: Diagnosis not present

## 2023-05-13 DIAGNOSIS — N184 Chronic kidney disease, stage 4 (severe): Secondary | ICD-10-CM | POA: Diagnosis not present

## 2023-05-13 DIAGNOSIS — R6884 Jaw pain: Secondary | ICD-10-CM | POA: Insufficient documentation

## 2023-05-13 DIAGNOSIS — Z79899 Other long term (current) drug therapy: Secondary | ICD-10-CM | POA: Insufficient documentation

## 2023-05-13 DIAGNOSIS — C099 Malignant neoplasm of tonsil, unspecified: Secondary | ICD-10-CM | POA: Insufficient documentation

## 2023-05-13 DIAGNOSIS — E785 Hyperlipidemia, unspecified: Secondary | ICD-10-CM | POA: Insufficient documentation

## 2023-05-13 DIAGNOSIS — I10 Essential (primary) hypertension: Secondary | ICD-10-CM | POA: Insufficient documentation

## 2023-05-13 DIAGNOSIS — N289 Disorder of kidney and ureter, unspecified: Secondary | ICD-10-CM | POA: Insufficient documentation

## 2023-05-13 DIAGNOSIS — Z87891 Personal history of nicotine dependence: Secondary | ICD-10-CM | POA: Insufficient documentation

## 2023-05-13 DIAGNOSIS — D696 Thrombocytopenia, unspecified: Secondary | ICD-10-CM | POA: Insufficient documentation

## 2023-05-13 DIAGNOSIS — Z7984 Long term (current) use of oral hypoglycemic drugs: Secondary | ICD-10-CM | POA: Insufficient documentation

## 2023-05-13 DIAGNOSIS — R5383 Other fatigue: Secondary | ICD-10-CM | POA: Insufficient documentation

## 2023-05-13 LAB — CBC WITH DIFFERENTIAL (CANCER CENTER ONLY)
Abs Immature Granulocytes: 0.02 10*3/uL (ref 0.00–0.07)
Basophils Absolute: 0 10*3/uL (ref 0.0–0.1)
Basophils Relative: 0 %
Eosinophils Absolute: 0 10*3/uL (ref 0.0–0.5)
Eosinophils Relative: 0 %
HCT: 22 % — ABNORMAL LOW (ref 36.0–46.0)
Hemoglobin: 7 g/dL — ABNORMAL LOW (ref 12.0–15.0)
Immature Granulocytes: 1 %
Lymphocytes Relative: 20 %
Lymphs Abs: 0.5 10*3/uL — ABNORMAL LOW (ref 0.7–4.0)
MCH: 29.2 pg (ref 26.0–34.0)
MCHC: 31.8 g/dL (ref 30.0–36.0)
MCV: 91.7 fL (ref 80.0–100.0)
Monocytes Absolute: 0.3 10*3/uL (ref 0.1–1.0)
Monocytes Relative: 13 %
Neutro Abs: 1.7 10*3/uL (ref 1.7–7.7)
Neutrophils Relative %: 66 %
Platelet Count: 106 10*3/uL — ABNORMAL LOW (ref 150–400)
RBC: 2.4 MIL/uL — ABNORMAL LOW (ref 3.87–5.11)
RDW: 18.4 % — ABNORMAL HIGH (ref 11.5–15.5)
WBC Count: 2.6 10*3/uL — ABNORMAL LOW (ref 4.0–10.5)
nRBC: 0 % (ref 0.0–0.2)

## 2023-05-13 LAB — PREPARE RBC (CROSSMATCH)

## 2023-05-13 LAB — RAD ONC ARIA SESSION SUMMARY
Course Elapsed Days: 11
Plan Fractions Treated to Date: 10
Plan Prescribed Dose Per Fraction: 2 Gy
Plan Total Fractions Prescribed: 35
Plan Total Prescribed Dose: 70 Gy
Reference Point Dosage Given to Date: 20 Gy
Reference Point Session Dosage Given: 2 Gy
Session Number: 10

## 2023-05-13 LAB — BASIC METABOLIC PANEL - CANCER CENTER ONLY
Anion gap: 9 (ref 5–15)
BUN: 20 mg/dL (ref 8–23)
CO2: 23 mmol/L (ref 22–32)
Calcium: 8.7 mg/dL — ABNORMAL LOW (ref 8.9–10.3)
Chloride: 105 mmol/L (ref 98–111)
Creatinine: 1.1 mg/dL — ABNORMAL HIGH (ref 0.44–1.00)
GFR, Estimated: 50 mL/min — ABNORMAL LOW (ref >60)
Glucose, Bld: 79 mg/dL (ref 70–99)
Potassium: 3.7 mmol/L (ref 3.5–5.1)
Sodium: 137 mmol/L (ref 135–145)

## 2023-05-13 LAB — MAGNESIUM: Magnesium: 1.5 mg/dL — ABNORMAL LOW (ref 1.7–2.4)

## 2023-05-13 MED ORDER — POTASSIUM CHLORIDE IN NACL 20-0.9 MEQ/L-% IV SOLN: Freq: Once | INTRAVENOUS | Status: AC

## 2023-05-13 MED ORDER — SODIUM CHLORIDE 0.9 % IV SOLN
INTRAVENOUS | Status: AC
  Filled 2023-05-13: qty 250

## 2023-05-13 MED ORDER — SODIUM CHLORIDE 0.9 % IV SOLN
20.0000 mg/m2 | Freq: Once | INTRAVENOUS | Status: AC
  Administered 2023-05-13: 31 mg via INTRAVENOUS
  Filled 2023-05-13: qty 31

## 2023-05-13 MED ORDER — PALONOSETRON HCL INJECTION 0.25 MG/5ML
0.2500 mg | Freq: Once | INTRAVENOUS | Status: AC
  Administered 2023-05-13: 0.25 mg via INTRAVENOUS
  Filled 2023-05-13: qty 5

## 2023-05-13 MED ORDER — SODIUM CHLORIDE 0.9 % IV SOLN: 40.0000 mg/m2 | Freq: Once | INTRAVENOUS | Status: DC

## 2023-05-13 MED ORDER — MAGNESIUM SULFATE 2 GM/50ML IV SOLN
2.0000 g | Freq: Once | INTRAVENOUS | Status: AC
  Administered 2023-05-13: 2 g via INTRAVENOUS

## 2023-05-13 MED ORDER — DEXAMETHASONE SODIUM PHOSPHATE 10 MG/ML IJ SOLN
10.0000 mg | Freq: Once | INTRAMUSCULAR | Status: AC
Start: 2023-05-13 — End: 2023-05-13
  Administered 2023-05-13: 10 mg via INTRAVENOUS
  Filled 2023-05-13: qty 1

## 2023-05-13 MED ORDER — HEPARIN SOD (PORK) LOCK FLUSH 100 UNIT/ML IV SOLN
500.0000 [IU] | Freq: Once | INTRAVENOUS | Status: AC | PRN
Start: 2023-05-13 — End: 2023-05-13
  Administered 2023-05-13: 500 [IU]
  Filled 2023-05-13: qty 5

## 2023-05-13 MED ORDER — SODIUM CHLORIDE 0.9 % IV SOLN
150.0000 mg | Freq: Once | INTRAVENOUS | Status: AC
  Administered 2023-05-13: 150 mg via INTRAVENOUS
  Filled 2023-05-13: qty 150

## 2023-05-13 NOTE — Progress Notes (Signed)
 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  INTERVAL HISTORY: Patient returns to clinic today for further evaluation and consideration of cycle 2 of weekly cisplatin. Tolerated first treatment well. Continues to have jaw pain but feels like it has reduced. Ongoing dysphagia and endorses weight loss but feels that appetite has improved. She has no neurologic complaints.  She denies any recent fevers or illnesses. She has no chest pain, shortness of breath, cough, or hemoptysis.  She denies any nausea, vomiting, constipation, or diarrhea.  She has no melena or hematochezia.  She has no urinary complaints.  Patient offers no further specific complaints today.  REVIEW OF SYSTEMS:   Review of Systems  Constitutional:  Positive for malaise/fatigue. Negative for fever and weight loss.  HENT:  Positive for sore throat.   Respiratory: Negative.  Negative for cough, hemoptysis and shortness of breath.   Cardiovascular: Negative.  Negative for chest pain and leg swelling.  Gastrointestinal: Negative.  Negative for abdominal pain, blood in stool, diarrhea and melena.  Genitourinary: Negative.  Negative for dysuria.  Musculoskeletal: Negative.  Negative for back pain.  Skin: Negative.  Negative for rash.  Neurological:  Positive for weakness. Negative for dizziness, focal weakness and headaches.  Psychiatric/Behavioral: Negative.  The patient is not nervous/anxious.      PAST MEDICAL HISTORY: Past Medical History:  Diagnosis Date   Anemia    Diabetes mellitus without complication (HCC)    Gout    Hyperlipidemia    Hypertension    Wears dentures    full upper and lower    PAST SURGICAL HISTORY: Past Surgical History:  Procedure Laterality Date   APPENDECTOMY     gallstones  06/12/2009   MICROLARYNGOSCOPY Right 01/28/2023   Procedure: MICRODIRECT LARYNGOSCOPY WITH BIOPSY OF OROPHARYNGEAL MASS;  Surgeon: Linus Salmons, MD;  Location: [facility];  Service: ENT;  Laterality: Right;  Diabetic   PORTACATH PLACEMENT N/A 02/18/2023   Procedure: INSERTION PORT-A-CATH;  Surgeon: Campbell Lerner, MD;  Location: [facility];  Service: General;  Laterality: N/A;   TONSILLECTOMY      FAMILY HISTORY: Family History  Problem Relation Age of Onset   Breast cancer Neg Hx     ADVANCED DIRECTIVES (Y/N):  N  HEALTH MAINTENANCE: Social History   Tobacco Use   Smoking status: Former    Current packs/day: 0.00    Average packs/day: 0.5 packs/day for 8.0 years (4.0 ttl pk-yrs)    Types: Cigarettes    Start date: 60    Quit date: 1990    Years since quitting: 35.2    Passive exposure: Past   Smokeless tobacco: Never  Vaping Use   Vaping status: Never Used  Substance Use Topics   Alcohol use: No    Alcohol/week: 0.0 standard drinks of alcohol   Drug use: No    Colonoscopy:  PAP:  Bone density:  Lipid panel:  No Known Allergies  Current Outpatient Medications  Medication Sig Dispense Refill   Accu-Chek Softclix Lancets lancets      amLODipine (  NORVASC) 10 MG tablet Take 10 mg by mouth daily.     aspirin 81 MG tablet Take 1 tablet by mouth daily.     capecitabine (XELODA) 500 MG tablet Take 2 tablets (1,000 mg total) by mouth 2 (two) times daily after a meal. Take Monday-Friday. Take only on days of radiation. 120 tablet 0   carvedilol (COREG) 3.125 MG tablet Take 1 tablet by mouth 2 (two) times daily. cardio     colchicine 0.6 MG  tablet TAKE 1 TABLET(0.6 MG) BY MOUTH DAILY 10 tablet 1   glucose blood (ACCU-CHEK AVIVA PLUS) test strip USE TWICE DAILY AS DIRECTED     lisinopril (ZESTRIL) 5 MG tablet Take 1 tablet (5 mg total) by mouth daily. 90 tablet 0   metFORMIN (GLUCOPHAGE) 500 MG tablet One every morning 90 tablet 1   traMADol (ULTRAM) 50 MG tablet Take 1 tablet (50 mg total) by mouth every 12 (twelve) hours as needed. 30 tablet 0   diclofenac Sodium (VOLTAREN) 1 % GEL Research Patient: Apply 0.5 g (1 fingertip) to each hand and each foot twice daily for the duration of radiation therapy (Patient not taking: Reported on 05/13/2023) 200 g 0   diphenoxylate-atropine (LOMOTIL) 2.5-0.025 MG tablet Take 1 tablet by mouth 4 (four) times daily as needed for diarrhea or loose stools. (Patient not taking: Reported on 05/13/2023) 60 tablet 1   lidocaine (XYLOCAINE) 2 % solution Use as directed 15 mLs in the mouth or throat every 6 (six) hours as needed for mouth pain. (Patient not taking: Reported on 05/13/2023) 100 mL 0   magic mouthwash (multi-ingredient) oral suspension Swish and swallow 5-10 mLs 4 (four) times daily as needed. (Patient not taking: Reported on 05/13/2023) 480 mL 3   Morphine Sulfate (MORPHINE CONCENTRATE) 10 mg / 0.5 ml concentrated solution Take 1 mL (20 mg total) by mouth every 2 (two) hours as needed for severe pain (pain score 7-10). (Patient not taking: Reported on 05/13/2023) 15 mL 0   oxyCODONE (OXY IR/ROXICODONE) 5 MG immediate release tablet Take 1 tablet (5 mg total) by mouth every 4 (four) hours as needed for severe pain (pain score 7-10). (Patient not taking: Reported on 05/13/2023) 60 tablet 0   prochlorperazine (COMPAZINE) 10 MG tablet Take 1 tablet (10 mg total) by mouth every 6 (six) hours as needed for nausea or vomiting. (Patient not taking: Reported on 05/13/2023) 30 tablet 1   tiZANidine (ZANAFLEX) 4 MG tablet Take 1 tablet (4 mg total) by mouth at bedtime. (Patient not taking: Reported on 05/13/2023) 90 tablet 1    Current Facility-Administered Medications  Medication Dose Route Frequency Provider Last Rate Last Admin   albuterol (PROVENTIL) (2.5 MG/3ML) 0.083% nebulizer solution 2.5 mg  2.5 mg Nebulization Once [name], MD       Facility-Administered Medications Ordered in Other Visits  Medication Dose Route Frequency Provider Last Rate Last Admin   0.9 %  sodium chloride infusion   Intravenous Continuous Orlie Dakin, Tollie Pizza, MD       CISplatin (PLATINOL) 31 mg in sodium chloride 0.9 % 250 mL chemo infusion  20 mg/m2 (Treatment Plan Recorded) Intravenous Once Alinda Dooms, NP       dexamethasone (DECADRON) injection 10 mg  10 mg Intravenous Once Jeralyn Ruths, MD       fosaprepitant (EMEND) 150 mg in sodium chloride 0.9 % 145 mL IVPB  150 mg Intravenous Once Jeralyn Ruths, MD       heparin lock  flush 100 unit/mL  500 Units Intracatheter Once PRN Jeralyn Ruths, MD       magnesium sulfate IVPB 2 g 50 mL  2 g Intravenous Once Jeralyn Ruths, MD 50 mL/hr at 05/13/23 0954 2 g at 05/13/23 0954   palonosetron (ALOXI) injection 0.25 mg  0.25 mg Intravenous Once Jeralyn Ruths, MD        OBJECTIVE: Vitals:   05/13/23 0844  BP: (!) 147/56  Pulse: (!) 57  Resp: 14  Temp: (!) 96.9 F (36.1 C)  SpO2: 100%   Body mass index is 18.4 kg/m.     ECOG FS:1 - Symptomatic but completely ambulatory  General: thin build. Wheelchair. Unaccompanied.  Eyes: Pink conjunctiva, anicteric sclera. Lungs: Clear to auscultation bilaterally.  No audible wheezing or coughing Heart: Regular rate and rhythm.  Abdomen: Soft, nontender, nondistended.  Musculoskeletal: No edema, cyanosis, or clubbing. Neuro: Alert, answering all questions appropriately. Cranial nerves grossly intact. Skin: No rashes or petechiae noted. Psych: Normal affect.   LAB RESULTS: Lab Results  Component Value Date   NA 137 05/13/2023   K 3.7 05/13/2023   CL 105 05/13/2023   CO2 23 05/13/2023   GLUCOSE  79 05/13/2023   BUN 20 05/13/2023   CREATININE 1.10 (H) 05/13/2023   CALCIUM 8.7 (L) 05/13/2023   PROT 6.8 04/12/2023   ALBUMIN 3.5 04/12/2023   AST 22 04/12/2023   ALT 8 04/12/2023   ALKPHOS 39 04/12/2023   BILITOT 0.9 04/12/2023   GFRNONAA 50 (L) 05/13/2023   GFRAA 59 (L) 09/24/2019   Lab Results  Component Value Date   WBC 2.6 (L) 05/13/2023   NEUTROABS 1.7 05/13/2023   HGB 7.0 (L) 05/13/2023   HCT 22.0 (L) 05/13/2023   MCV 91.7 05/13/2023   PLT 106 (L) 05/13/2023    STUDIES: No results found.   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 She completed concurrent XRT with chemotherapy. Last dose of mitomycin was on March 07, 2023.  She finished Xeloda and XRT on March 25, 2023.  Repeat PET scan results from April 07, 2023 reported with persistent hypermetabolism at the site of her anal cancer, but PET scan was done sooner than required given her head and neck cancer and this may be residual inflammation/irritation from XRT.  A referral has been sent back to surgery for direct visualization however patient missed appointments. She agrees to appt and asks that we assist with rescheduling.  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 She became increasingly symptomatic with dysphagia. Concurrent XRT and cisplatin chemotherapy was recommended. Initiation of treatment delayed d/t missed appointments. Initiated XRT and dose reduced cisplatin to 20 mg/m2 d/t GFR < 45 on on 05/02/23. Kidney function slightly improved this week however, persistently < 45. Therefore, proceed at 20 mg/m2.  Thyroid nodule with hypermetabolism: Defer ultrasound and biopsy until completion  of treatment of 2 malignancies above. Anemia: Hemoglobin is trended down to 7.0. Symptomatic. Plan for 2 units pRBCs next week.  Leukopenia: Chronic and unchanged.  Patient's total white blood cell count is 2.6, ANC 1.7. Proceed with treatment.  Thrombocytopenia: Platelets 106. Ok for treatment. Monitor closely.   Hypomagnesia: Patient will receive  2 g IV magnesium with treatment later this week.   Hypokalemia: s/p IV potassium. K 3.7 today. Hold IV potassium.  Renal insufficiency: Chronic and unchanged.  Patient's creatinine is 1.1.  Continue to monitor closely with initiation of cisplatin.  IV fluids as above. Dysphagia: Continue tramadol as needed.  Patient was given a prescription for 5 mg oxycodone.  She previously received 2 mg IV morphine during treatment today.  Disposition:  Tx today Monday- 2 units pRBCs Add 4/14- poss 1 unit blood Keep other appts as is per IS- la   I spent a total of 30 minutes reviewing chart data, face-to-face evaluation with the patient, counseling and coordination of care as detailed above.  Patient expressed understanding and was in agreement with this plan. She also understands that She can call clinic at any time with any questions, concerns, or complaints.    Cancer Staging  Anal squamous cell carcinoma (HCC) Staging form: Anus, AJCC V9 - Clinical stage from 01/18/2023: Stage IIIA (cT3, cN0, cM0) - Signed by Jeralyn Ruths, MD on 01/18/2023 Stage prefix: Initial diagnosis  Right tonsillar squamous cell carcinoma (HCC) Staging form: Pharynx - P16 Negative Oropharynx, AJCC 8th Edition - Clinical stage from 04/12/2023: Stage III (cT3, cN1, cM0, p16: Unknown) - Signed by Jeralyn Ruths, MD on 04/12/2023 Stage prefix: Initial diagnosis   Alinda Dooms, NP   05/13/2023

## 2023-05-13 NOTE — Progress Notes (Signed)
 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  Patient has completed treatment for anus cancer.  Patient currently on cisplatin and radiation for head and neck cancer.  Met with patient during infusion.  Says that her appetite is "wonderful".  Denies any pain with swallowing.  Says that she has diarrhea every time she drinks the "milk (ie protein shake)". She does not remember what the name of the shake is.  Says that her last episode of diarrhea was on Monday, 3/31 and she has not drank any shakes since then.  Later in the conversation she says that it is only the vanilla shake that makes her have diarrhea. The chocolate one does not cause diarrhea.  Eating doughnut and drinking coffee during visit.  Reports that yesterday ate a ham biscuit for breakfast.  Lunch was baked beans and slaw and diet Mt Dew.  Supper was green beans and 1 chicken wing.  Says that she snacks on chips, nabs and applesauce.  Likes ice cream and says that it does not upset her stomach.     Medications: reviewed  Labs: reviewed  Anthropometrics:   Weight 114 lb today  118 lb 4 oz on radiation scale on 3/26 117 lb on 3/4 129 lb 2 oz on 2/5 137 lb on 1/10 144 lb 8 oz on 12/30  17% weight loss in the last 4 months, significant   NUTRITION DIAGNOSIS: Unintentional weight loss continues   MALNUTRITION DIAGNOSIS: Severe malnutrition continues   INTERVENTION:  Since appetite is good and patient is denying symptoms, encouraged adding snacks TID between meals (ie ice cream, 1/2 meat sandwich, nabs, cheese and crackers) to add more calorie and stop weight loss. Drink chocolate shake as patient reports this flavor does not cause diarrhea. Can use as snack between meals Discussed importance of increasing calories and protein to prevent further weight loss.       MONITORING, EVALUATION, GOAL: weight trends, intake   NEXT VISIT: Friday, April 11 during infusion  Maxamilian Amadon  B. Elease Hashimoto, CSO, LDN Registered Dietitian [phone]

## 2023-05-13 NOTE — Patient Instructions (Signed)
 [facility]  Discharge Instructions: Thank you for choosing [facility] to provide your oncology and hematology care.  If you have a lab appointment with the Cancer Center, please go directly to the Cancer Center and check in at the registration area.  Wear comfortable clothing and clothing appropriate for easy access to any Portacath or PICC line.   We strive to give you quality time with your provider. You may need to reschedule your appointment if you arrive late (15 or more minutes).  Arriving late affects you and other patients whose appointments are after yours.  Also, if you miss three or more appointments without notifying the office, you may be dismissed from the clinic at the provider's discretion.      For prescription refill requests, have your pharmacy contact our office and allow 72 hours for refills to be completed.    Today you received the following chemotherapy and/or immunotherapy agents CISPLATIN      To help prevent nausea and vomiting after your treatment, we encourage you to take your nausea medication as directed.  BELOW ARE SYMPTOMS THAT SHOULD BE REPORTED IMMEDIATELY: *FEVER GREATER THAN 100.4 F (38 C) OR HIGHER *CHILLS OR SWEATING *NAUSEA AND VOMITING THAT IS NOT CONTROLLED WITH YOUR NAUSEA MEDICATION *UNUSUAL SHORTNESS OF BREATH *UNUSUAL BRUISING OR BLEEDING *URINARY PROBLEMS (pain or burning when urinating, or frequent urination) *BOWEL PROBLEMS (unusual diarrhea, constipation, pain near the anus) TENDERNESS IN MOUTH AND THROAT WITH OR WITHOUT PRESENCE OF ULCERS (sore throat, sores in mouth, or a toothache) UNUSUAL RASH, SWELLING OR PAIN  UNUSUAL VAGINAL DISCHARGE OR ITCHING   Items with * indicate a potential emergency and should be followed up as soon as possible or go to the Emergency Department if any problems should occur.  Please show the CHEMOTHERAPY ALERT CARD or IMMUNOTHERAPY  ALERT CARD at check-in to the Emergency Department and triage nurse.  Should you have questions after your visit or need to cancel or reschedule your appointment, please contact [facility] - A DEPT OF [facility]  [phone] and follow the prompts.  Office hours are 8:00 a.m. to 4:30 p.m. Monday - Friday. Please note that voicemails left after 4:00 p.m. may not be returned until the following business day.  We are closed weekends and major holidays. You have access to a nurse at all times for urgent questions. Please call the main number to the clinic [phone] and follow the prompts.  For any non-urgent questions, you may also contact your provider using MyChart. We now offer e-Visits for anyone 49 and older to request care online for non-urgent symptoms. For details visit [email or web address].   Also download the MyChart app! Go to the app store, search "MyChart", open the app, select [place], and log in with your MyChart username and password.  Cisplatin Injection What is this medication? CISPLATIN (SIS pla tin) treats some types of cancer. It works by slowing down the growth of cancer cells. This medicine may be used for other purposes; ask your health care provider or pharmacist if you have questions. COMMON BRAND NAME(S): Platinol, Platinol -AQ What should I tell my care team before I take this medication? They need to know if you have any of these conditions: Eye disease, vision problems Hearing problems Kidney disease Low blood counts, such as low white cells, platelets, or red blood cells Tingling of the fingers  or toes, or other nerve disorder An unusual or allergic reaction to cisplatin, carboplatin, oxaliplatin, other medications, foods, dyes, or preservatives If you or your partner are pregnant or trying to get pregnant Breast-feeding How should I use this medication? This medication is injected into a vein. It is given by your care  team in a hospital or clinic setting. Talk to your care team about the use of this medication in children. Special care may be needed. Overdosage: If you think you have taken too much of this medicine contact a poison control center or emergency room at once. NOTE: This medicine is only for you. Do not share this medicine with others. What if I miss a dose? Keep appointments for follow-up doses. It is important not to miss your dose. Call your care team if you are unable to keep an appointment. What may interact with this medication? Do not take this medication with any of the following: Live virus vaccines This medication may also interact with the following: Certain antibiotics, such as amikacin, gentamicin, neomycin, polymyxin B, streptomycin, tobramycin, vancomycin Foscarnet This list may not describe all possible interactions. Give your health care provider a list of all the medicines, herbs, non-prescription drugs, or dietary supplements you use. Also tell them if you smoke, drink alcohol, or use illegal drugs. Some items may interact with your medicine. What should I watch for while using this medication? Your condition will be monitored carefully while you are receiving this medication. You may need blood work done while taking this medication. This medication may make you feel generally unwell. This is not uncommon, as chemotherapy can affect healthy cells as well as cancer cells. Report any side effects. Continue your course of treatment even though you feel ill unless your care team tells you to stop. This medication may increase your risk of getting an infection. Call your care team for advice if you get a fever, chills, sore throat, or other symptoms of a cold or flu. Do not treat yourself. Try to avoid being around people who are sick. Avoid taking medications that contain aspirin, acetaminophen, ibuprofen, naproxen, or ketoprofen unless instructed by your care team. These medications  may hide a fever. This medication may increase your risk to bruise or bleed. Call your care team if you notice any unusual bleeding. Be careful brushing or flossing your teeth or using a toothpick because you may get an infection or bleed more easily. If you have any dental work done, tell your dentist you are receiving this medication. Drink fluids as directed while you are taking this medication. This will help protect your kidneys. Call your care team if you get diarrhea. Do not treat yourself. Talk to your care team if you or your partner wish to become pregnant or think you might be pregnant. This medication can cause serious birth defects if taken during pregnancy and for 14 months after the last dose. A negative pregnancy test is required before starting this medication. A reliable form of contraception is recommended while taking this medication and for 14 months after the last dose. Talk to your care team about effective forms of contraception. Do not father a child while taking this medication and for 11 months after the last dose. Use a condom during sex during this time period. Do not breast-feed while taking this medication. This medication may cause infertility. Talk to your care team if you are concerned about your fertility. What side effects may I notice from receiving this medication? Side  effects that you should report to your care team as soon as possible: Allergic reactions--skin rash, itching, hives, swelling of the face, lips, tongue, or throat Eye pain, change in vision, vision loss Hearing loss, ringing in ears Infection--fever, chills, cough, sore throat, wounds that don't heal, pain or trouble when passing urine, general feeling of discomfort or being unwell Kidney injury--decrease in the amount of urine, swelling of the ankles, hands, or feet Low red blood cell level--unusual weakness or fatigue, dizziness, headache, trouble breathing Painful swelling, warmth, or redness  of the skin, blisters or sores at the infusion site Pain, tingling, or numbness in the hands or feet Unusual bruising or bleeding Side effects that usually do not require medical attention (report to your care team if they continue or are bothersome): Hair loss Nausea Vomiting This list may not describe all possible side effects. Call your doctor for medical advice about side effects. You may report side effects to FDA at 1-800-FDA-1088. Where should I keep my medication? This medication is given in a hospital or clinic. It will not be stored at home. NOTE: This sheet is a summary. It may not cover all possible information. If you have questions about this medicine, talk to your doctor, pharmacist, or health care provider.  2024 Elsevier/Gold Standard (2021-05-29 00:00:00)

## 2023-05-16 ENCOUNTER — Ambulatory Visit

## 2023-05-16 ENCOUNTER — Other Ambulatory Visit

## 2023-05-16 ENCOUNTER — Ambulatory Visit (INDEPENDENT_AMBULATORY_CARE_PROVIDER_SITE_OTHER): Admitting: Family Medicine

## 2023-05-16 ENCOUNTER — Encounter: Payer: Self-pay | Admitting: Family Medicine

## 2023-05-16 ENCOUNTER — Inpatient Hospital Stay

## 2023-05-16 ENCOUNTER — Other Ambulatory Visit: Payer: Self-pay

## 2023-05-16 ENCOUNTER — Ambulatory Visit
Admission: RE | Admit: 2023-05-16 | Discharge: 2023-05-16 | Disposition: A | Discharge status: Home / Self Care | Source: Ambulatory Visit | Attending: Radiation Oncology | Admitting: Radiation Oncology

## 2023-05-16 ENCOUNTER — Ambulatory Visit: Admitting: Oncology

## 2023-05-16 VITALS — BP 152/60 | HR 53 | Ht 66.0 in | Wt 122.0 lb

## 2023-05-16 DIAGNOSIS — I10 Essential (primary) hypertension: Secondary | ICD-10-CM | POA: Diagnosis not present

## 2023-05-16 DIAGNOSIS — Z510 Encounter for antineoplastic radiation therapy: Secondary | ICD-10-CM | POA: Diagnosis not present

## 2023-05-16 DIAGNOSIS — D649 Anemia, unspecified: Secondary | ICD-10-CM

## 2023-05-16 DIAGNOSIS — Z5111 Encounter for antineoplastic chemotherapy: Secondary | ICD-10-CM

## 2023-05-16 LAB — RAD ONC ARIA SESSION SUMMARY
Course Elapsed Days: 14
Plan Fractions Treated to Date: 11
Plan Prescribed Dose Per Fraction: 2 Gy
Plan Total Fractions Prescribed: 35
Plan Total Prescribed Dose: 70 Gy
Reference Point Dosage Given to Date: 22 Gy
Reference Point Session Dosage Given: 2 Gy
Session Number: 11

## 2023-05-16 MED ORDER — SODIUM CHLORIDE 0.9% FLUSH
10.0000 mL | INTRAVENOUS | Status: DC | PRN
Start: 2023-05-16 — End: 2023-05-16
  Filled 2023-05-16: qty 10

## 2023-05-16 MED ORDER — LISINOPRIL 20 MG PO TABS
20.0000 mg | ORAL_TABLET | Freq: Every day | ORAL | 0 refills | Status: DC
Start: 2023-05-16 — End: 2023-06-14

## 2023-05-16 MED ORDER — SODIUM CHLORIDE 0.9% IV SOLUTION
250.0000 mL | INTRAVENOUS | Status: DC
Start: 2023-05-16 — End: 2023-05-16
  Administered 2023-05-16: 100 mL via INTRAVENOUS
  Filled 2023-05-16: qty 250

## 2023-05-16 MED ORDER — ACETAMINOPHEN 325 MG PO TABS
650.0000 mg | ORAL_TABLET | Freq: Once | ORAL | Status: AC
  Administered 2023-05-16: 650 mg via ORAL
  Filled 2023-05-16: qty 2

## 2023-05-16 MED ORDER — DIPHENHYDRAMINE HCL 25 MG PO CAPS
25.0000 mg | ORAL_CAPSULE | Freq: Once | ORAL | Status: AC
Start: 2023-05-16 — End: 2023-05-16
  Administered 2023-05-16: 25 mg via ORAL
  Filled 2023-05-16: qty 1

## 2023-05-16 MED ORDER — HEPARIN SOD (PORK) LOCK FLUSH 100 UNIT/ML IV SOLN
500.0000 [IU] | Freq: Every day | INTRAVENOUS | Status: AC | PRN
  Administered 2023-05-16: 500 [IU]
  Filled 2023-05-16: qty 5

## 2023-05-16 NOTE — Progress Notes (Signed)
 Date:  05/16/2023   Name:  April [name]   DOB:  09-18-1940   MRN:  [id number]   Chief Complaint: Medical Management of Chronic Issues  Hypertension This is a chronic problem. The current episode started more than 1 year ago. The problem has been waxing and waning since onset. The problem is uncontrolled. Pertinent negatives include no anxiety, blurred vision, chest pain, headaches, malaise/fatigue, neck pain, orthopnea, palpitations, peripheral edema, PND, shortness of breath or sweats. There are no associated agents to hypertension.    Lab Results  Component Value Date   NA 137 05/13/2023   K 3.7 05/13/2023   CO2 23 05/13/2023   GLUCOSE 79 05/13/2023   BUN 20 05/13/2023   CREATININE 1.10 (H) 05/13/2023   CALCIUM 8.7 (L) 05/13/2023   EGFR 48 (L) 06/01/2022   GFRNONAA 50 (L) 05/13/2023   Lab Results  Component Value Date   CHOL 128 12/02/2022   HDL 47 12/02/2022   LDLCALC 65 12/02/2022   TRIG 84 12/02/2022   CHOLHDL 3.1 09/24/2019   Lab Results  Component Value Date   TSH 0.98 07/24/2021   Lab Results  Component Value Date   HGBA1C 6.6 04/13/2022   Lab Results  Component Value Date   WBC 2.6 (L) 05/13/2023   HGB 7.0 (L) 05/13/2023   HCT 22.0 (L) 05/13/2023   MCV 91.7 05/13/2023   PLT 106 (L) 05/13/2023   Lab Results  Component Value Date   ALT 8 04/12/2023   AST 22 04/12/2023   ALKPHOS 39 04/12/2023   BILITOT 0.9 04/12/2023   No results found for: "25OHVITD2", "25OHVITD3", "VD25OH"   Review of Systems  Constitutional:  Negative for fatigue and malaise/fatigue.  Eyes:  Negative for blurred vision.  Respiratory:  Negative for chest tightness, shortness of breath and wheezing.   Cardiovascular:  Negative for chest pain, palpitations, orthopnea, leg swelling and PND.  Gastrointestinal:  Positive for abdominal distention.  Musculoskeletal:  Negative for neck pain.  Neurological:  Negative for headaches.    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   Essential hypertension 06/20/2014   Routine sports examination 06/20/2014   Health examination of defined subpopulation 06/20/2014   Hyperlipidemia due to type 2 diabetes mellitus (HCC) 06/20/2014    No Known Allergies  Past Surgical History:  Procedure Laterality Date   APPENDECTOMY     gallstones  06/12/2009   MICROLARYNGOSCOPY Right 01/28/2023   Procedure: MICRODIRECT LARYNGOSCOPY WITH BIOPSY OF OROPHARYNGEAL MASS;  Surgeon: Linus Salmons, MD;  Location: [facility];  Service: ENT;  Laterality: Right;  Diabetic   PORTACATH PLACEMENT N/A 02/18/2023   Procedure: INSERTION PORT-A-CATH;  Surgeon: Campbell Lerner, MD;  Location: [facility];  Service: General;  Laterality: N/A;   TONSILLECTOMY      Social History   Tobacco Use   Smoking status: Former    Current packs/day: 0.00    Average packs/day: 0.5 packs/day for 8.0 years (4.0 ttl pk-yrs)    Types: Cigarettes  Start date: 34    Quit date: 32    Years since quitting: 35.2    Passive exposure: Past   Smokeless tobacco: Never  Vaping Use   Vaping status: Never Used  Substance Use Topics   Alcohol use: No    Alcohol/week: 0.0 standard drinks of alcohol   Drug use: No     Medication list has been reviewed and updated.  Current Meds  Medication Sig   Accu-Chek Softclix Lancets lancets    amLODipine (NORVASC) 10 MG tablet Take 10 mg by mouth daily.   aspirin 81  MG tablet Take 1 tablet by mouth daily.   capecitabine (XELODA) 500 MG tablet Take 2 tablets (1,000 mg total) by mouth 2 (two) times daily after a meal. Take Monday-Friday. Take only on days of radiation.   carvedilol (COREG) 3.125 MG tablet Take 1 tablet by mouth 2 (two) times daily. cardio   colchicine 0.6 MG tablet TAKE 1 TABLET(0.6 MG) BY MOUTH DAILY   glucose blood (ACCU-CHEK AVIVA PLUS) test strip USE TWICE DAILY AS DIRECTED   lisinopril (ZESTRIL) 5 MG tablet Take 1 tablet (5 mg total) by mouth daily.   magic mouthwash (multi-ingredient) oral suspension Swish and swallow 5-10 mLs 4 (four) times daily as needed.   metFORMIN (GLUCOPHAGE) 500 MG tablet One every morning   tiZANidine (ZANAFLEX) 4 MG tablet Take 1 tablet (4 mg total) by mouth at bedtime.       05/12/2023   10:14 AM 01/04/2023   10:44 AM 12/02/2022    9:44 AM 08/27/2022    3:03 PM  GAD 7 : Generalized Anxiety Score  Nervous, Anxious, on Edge 0 0 0 0  Control/stop worrying 0 0 0 0  Worry too much - different things  0 0 0  Trouble relaxing  0 0 0  Restless  0 0 0  Easily annoyed or irritable  0 0 0  Afraid - awful might happen  0 0 0  Total GAD 7 Score  0 0 0  Anxiety Difficulty  Not difficult at all Not difficult at all Not difficult at all       05/12/2023   10:14 AM 01/04/2023   10:44 AM 12/02/2022    9:43 AM  Depression screen PHQ 2/9  Decreased Interest 0 0 0  Down, Depressed, Hopeless 0 0 0  PHQ - 2 Score 0 0 0  Altered sleeping  0 0  Tired, decreased energy  0 0  Change in appetite  0 0  Feeling bad or failure about yourself   0 0  Trouble concentrating  0 0  Moving slowly or fidgety/restless  0 0  Suicidal thoughts  0 0  PHQ-9 Score  0 0  Difficult doing work/chores  Not difficult at all Not difficult at all    BP Readings from Last 3 Encounters:  05/16/23 (!) 158/60  05/13/23 (!) 147/56  05/12/23 108/64    Physical Exam Vitals and nursing note reviewed.  HENT:     Right Ear: Tympanic  membrane normal.     Left Ear: Tympanic membrane normal.     Mouth/Throat:     Mouth: Mucous membranes are dry.  Eyes:     Pupils: Pupils are equal, round, and reactive to light.  Pulmonary:     Breath sounds: No wheezing, rhonchi or rales.  Abdominal:     General: There is no distension.     Palpations: There is no mass.     Tenderness: There  is no abdominal tenderness.  Neurological:     Mental Status: She is alert.     Wt Readings from Last 3 Encounters:  05/16/23 122 lb (55.3 kg)  05/13/23 114 lb (51.7 kg)  05/12/23 120 lb 4 oz (54.5 kg)    BP (!) 158/60   Pulse (!) 53   Ht 5\' 6"  (1.676 m)   Wt 122 lb (55.3 kg)   SpO2 100%   BMI 19.69 kg/m   Assessment and Plan: 1. Essential hypertension (Primary) Chronic.  Uncontrolled.  Stable.  Patient is feeling significantly better since we stopped the lisinopril hydrochlorothiazide.  However blood pressure is elevated from a systolic standpoint but is normal from the diastolic standpoint.  Patient was on combination lisinopril hydrochlorothiazide 20-12.5.  Will continue with the holding of the hydrochlorothiazide and recommending resuming 5 mg lisinopril which she did not pick up we will resume at 20 mg lisinopril.  Patient will return in 1 week for [place] to recheck blood pressure at which time we will likely recheck her glucose to see if she is having any issues with her diabetes since we held her metformin as well given that her GFR was elevated.  Patient has been encouraged to bring all her medications with her at this next time she comes. - lisinopril (ZESTRIL) 20 MG tablet; Take 1 tablet (20 mg total) by mouth daily.  Dispense: 30 tablet; Refill: 0     [name], MD

## 2023-05-16 NOTE — Patient Instructions (Signed)
 [facility]  Discharge Instructions: Thank you for choosing [facility] to provide your oncology and hematology care.  If you have a lab appointment with the Cancer Center, please go directly to the Cancer Center and check in at the registration area.  Wear comfortable clothing and clothing appropriate for easy access to any Portacath or PICC line.   We strive to give you quality time with your provider. You may need to reschedule your appointment if you arrive late (15 or more minutes).  Arriving late affects you and other patients whose appointments are after yours.  Also, if you miss three or more appointments without notifying the office, you may be dismissed from the clinic at the provider's discretion.      For prescription refill requests, have your pharmacy contact our office and allow 72 hours for refills to be completed.    Today you received the following agents blood transfusion.      To help prevent nausea and vomiting after your treatment, we encourage you to take your nausea medication as directed.  BELOW ARE SYMPTOMS THAT SHOULD BE REPORTED IMMEDIATELY: *FEVER GREATER THAN 100.4 F (38 C) OR HIGHER *CHILLS OR SWEATING *NAUSEA AND VOMITING THAT IS NOT CONTROLLED WITH YOUR NAUSEA MEDICATION *UNUSUAL SHORTNESS OF BREATH *UNUSUAL BRUISING OR BLEEDING *URINARY PROBLEMS (pain or burning when urinating, or frequent urination) *BOWEL PROBLEMS (unusual diarrhea, constipation, pain near the anus) TENDERNESS IN MOUTH AND THROAT WITH OR WITHOUT PRESENCE OF ULCERS (sore throat, sores in mouth, or a toothache) UNUSUAL RASH, SWELLING OR PAIN  UNUSUAL VAGINAL DISCHARGE OR ITCHING   Items with * indicate a potential emergency and should be followed up as soon as possible or go to the Emergency Department if any problems should occur.  Please show the CHEMOTHERAPY ALERT CARD or IMMUNOTHERAPY ALERT CARD at check-in to  the Emergency Department and triage nurse.  Should you have questions after your visit or need to cancel or reschedule your appointment, please contact [facility] - A DEPT OF [facility]  [phone] and follow the prompts.  Office hours are 8:00 a.m. to 4:30 p.m. Monday - Friday. Please note that voicemails left after 4:00 p.m. may not be returned until the following business day.  We are closed weekends and major holidays. You have access to a nurse at all times for urgent questions. Please call the main number to the clinic [phone] and follow the prompts.  For any non-urgent questions, you may also contact your provider using MyChart. We now offer e-Visits for anyone 96 and older to request care online for non-urgent symptoms. For details visit [email or web address].   Also download the MyChart app! Go to the app store, search "MyChart", open the app, select [place], and log in with your MyChart username and password.

## 2023-05-17 ENCOUNTER — Ambulatory Visit (INDEPENDENT_AMBULATORY_CARE_PROVIDER_SITE_OTHER): Admitting: Surgery

## 2023-05-17 ENCOUNTER — Other Ambulatory Visit: Payer: Self-pay

## 2023-05-17 ENCOUNTER — Ambulatory Visit

## 2023-05-17 ENCOUNTER — Encounter: Payer: Self-pay | Admitting: Surgery

## 2023-05-17 ENCOUNTER — Ambulatory Visit
Admission: RE | Admit: 2023-05-17 | Discharge: 2023-05-17 | Disposition: A | Discharge status: Home / Self Care | Source: Ambulatory Visit | Attending: Radiation Oncology | Admitting: Radiation Oncology

## 2023-05-17 VITALS — BP 192/75 | HR 71 | Temp 98.9°F | Ht 66.0 in | Wt 121.2 lb

## 2023-05-17 DIAGNOSIS — C210 Malignant neoplasm of anus, unspecified: Secondary | ICD-10-CM | POA: Diagnosis not present

## 2023-05-17 DIAGNOSIS — Z510 Encounter for antineoplastic radiation therapy: Secondary | ICD-10-CM | POA: Diagnosis not present

## 2023-05-17 LAB — TYPE AND SCREEN
ABO/RH(D): A POS
Antibody Screen: NEGATIVE
Unit division: 0
Unit division: 0

## 2023-05-17 LAB — RAD ONC ARIA SESSION SUMMARY
Course Elapsed Days: 15
Plan Fractions Treated to Date: 12
Plan Prescribed Dose Per Fraction: 2 Gy
Plan Total Fractions Prescribed: 35
Plan Total Prescribed Dose: 70 Gy
Reference Point Dosage Given to Date: 24 Gy
Reference Point Session Dosage Given: 2 Gy
Session Number: 12

## 2023-05-17 LAB — BPAM RBC
Blood Product Expiration Date: 202505032359
Blood Product Expiration Date: 202505052359
ISSUE DATE / TIME: 202504071220
ISSUE DATE / TIME: 202504071350
Unit Type and Rh: 6200
Unit Type and Rh: 6200

## 2023-05-17 NOTE — Patient Instructions (Signed)
Please call the office if you have any questions or concerns. 

## 2023-05-17 NOTE — Progress Notes (Signed)
 Surgical Clinic Progress/Follow-up Note   HPI:  83 y.o. Female presents to clinic for follow-up , she presents today with no one with her.   Now post treatment with chemotherapy and radiation, for evaluation of residual Anal carcinoma.  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  Patient has now completed concurrent XRT along with chemotherapy.  Her last dose of mitomycin was on March 07, 2023.  She finished Xeloda and XRT on March 25, 2023.  Repeat PET scan results from April 07, 2023 reviewed independently and reported as above with persistent hypermetabolism at the site of her anal cancer, but PET scan was done sooner than required given her head and neck cancer and this may be residual inflammation/irritation from XRT.  A referral has been sent back to surgery for direct visualization.  Continue to monitor closely."  She is currently receiving IMRT treatment for her head and neck cancer, and followed by oncology. Patient reports  improvement/resolution of prior issues and has been tolerating regular diet with +flatus and normal BM's, denies N/V, fever/chills, CP, or SOB. She denies having any remarkable anal bleeding.    Review of Systems:  Constitutional: denies fever/chills  ENT: denies hearing problems  Respiratory: denies shortness of breath, wheezing  Cardiovascular: denies chest pain, palpitations  Gastrointestinal: denies abdominal pain, N/V, or diarrhea/and bowel function as per interval history Skin: Denies any other rashes or skin discoloration as per interval history  Vital Signs:  BP (!) 192/75   Pulse 71   Temp 98.9 F (37.2 C) (Oral)   Ht 5\' 6"  (1.676 m)   Wt 121 lb 3.2 oz (55 kg)   SpO2 99%   BMI 19.56 kg/m    Physical Exam:  Constitutional:  -- Normalbody habitus  -- Awake,  alert, and oriented x3  Pulmonary:  -- Breathing non-labored at rest Gastrointestinal:  -- Soft and non-distended, non-tender GU  --Irving Burton present as chaperone DRE  Although tender, the anal canal is well appreciated wihtout ulcer, or irregularity, some firmness anteriorly w/o remarkable irregularity and w/o definite mass/nodule.  Musculoskeletal / Integumentary:  -- Wounds or skin discoloration: None appreciated -- Extremities: w/o lesion/or edema   Laboratory studies:   Imaging:  CLINICAL DATA:  Initial treatment strategy for rectal cancer.   EXAM: NUCLEAR MEDICINE PET SKULL BASE TO THIGH   TECHNIQUE: 7.3 mCi F-18 FDG was injected intravenously. Full-ring PET imaging was performed from the skull base to thigh after the radiotracer. CT data was obtained and used for attenuation correction and anatomic localization.   Fasting blood glucose: 82 mg/dl   COMPARISON:  None Available.   FINDINGS: Mediastinal blood pool activity: SUV max 1.8   Liver activity: SUV max NA   NECK:   Right oropharyngeal mass measures approximately 1.6 x 2.3 cm (6/20), SUV max 20.7. Adjacent right level II lymph nodes with index lymph node measuring 7 mm (6/26), SUV max 3.4.   Incidental CT findings:   None.   CHEST:   Heterogeneous, enlarged and nodular thyroid with areas of hypermetabolism. Index lesion on the left, SUV max 9.5. No additional abnormal hypermetabolism.   Incidental CT findings:   Atherosclerotic calcification of the aorta, aortic valve and coronary arteries. Heart is enlarged. No pericardial or pleural effusion.   ABDOMEN/PELVIS:   Anorectal hypermetabolism, SUV max 11.4, with soft tissue fullness on CT. A  discrete mass is difficult to measure without IV or enteric contrast. No perirectal hypermetabolic lymph nodes. No additional abnormal hypermetabolism.   Incidental CT findings:   Cholecystectomy. Low-attenuation lesions in the kidneys. No specific follow-up  necessary.   SKELETON:   No abnormal hypermetabolism.   Incidental CT findings:   Degenerative changes in the spine.   IMPRESSION: 1. 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 No evidence of distant metastatic disease. 2. Hypermetabolic right oropharyngeal mass with hypermetabolic right level II cervical lymph nodes, compatible with primary head/neck cancer. 3. Heterogeneous, nodular and enlarged thyroid with areas of hypermetabolism. Recommend thyroid US and biopsy. (Ref: J Am Coll Radiol. 2015 Feb;12(2): 143-50). 4. Aortic atherosclerosis (ICD10-I70.0). Coronary artery calcification.     Electronically Signed   By: Leanna Battles M.D.   On: 01/17/2023 13:19 CLINICAL DATA:  Subsequent treatment strategy for anorectal cancer and head/neck cancer.   EXAM: NUCLEAR MEDICINE PET SKULL BASE TO THIGH   TECHNIQUE: 6.97 mCi F-18 FDG was injected intravenously. Full-ring PET imaging was performed from the skull base to thigh after the radiotracer. CT data was obtained and used for attenuation correction and anatomic localization.   Fasting blood glucose: 64 mg/dl   COMPARISON:  PET-CT 11/91/4782   FINDINGS: Mediastinal blood pool activity: SUV max 1.98   Liver activity: SUV max NA   NECK: Again demonstrated is a large oropharyngeal mass centered in the right tonsillar region. It appears to involve the epiglottis but no subglottic extension. SUV max is 19.27 and was previously 20.7. Right-sided level 2 lymph node is slightly larger measuring 9 mm and previously measuring 7 mm. SUV max is 9.68 and was previously 3.4. No new cervical adenopathy.   There are 2 hypermetabolic thyroid nodules. The left thyroid nodule has an SUV max of 9.58 and was previously 9.48. The right nodule has an SUV max of 6.58 and was previously 5.3.   Incidental CT findings: Bilateral carotid artery calcifications.   CHEST: No hypermetabolic  mediastinal or hilar nodes. No suspicious pulmonary nodules on the CT scan.   No hypermetabolic breast masses or axillary adenopathy.   Incidental CT findings: Stable advanced aortic and coronary artery calcifications. No acute pulmonary findings.   ABDOMEN/PELVIS: Persistent anorectal mass is difficult to measure but appears smaller. SUV max is 17.11 and was previously 22.01.   No findings suspicious for abdominopelvic metastatic disease.   Incidental CT findings: Stable advanced vascular calcifications. The gallbladder is surgically absent. Stable diffuse colonic diverticulosis.   SKELETON: No findings for osseous metastatic disease.   Incidental CT findings: None.   IMPRESSION: 1. 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 It appears to involve the epiglottis but no subglottic extension. Stable level of hypermetabolism. 2. Slightly larger right-sided level 2 lymph node with increased FDG activity. 3. Persistent anorectal mass is difficult to measure but appears smaller. Slight decrease in FDG uptake. 4. Stable hypermetabolic thyroid nodules. 5. No findings for metastatic disease involving the chest, abdomen/pelvis or bony structures. 6. Stable vascular disease. 7. Aortic atherosclerosis.     Electronically Signed   By: Rudie Meyer M.D.   On: 04/07/2023 21:38  Assessment:  83 y.o. yo Female with a problem list including...  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  Plan:              - continue radiation and heme/Onc follow-up re: her tonsillar SCC.  - F/U IN 3-6 mos for surveillence.   All of the above recommendations were discussed with the patiet, and all of patient's questions were answered to their expressed satisfaction.  These notes generated with voice recognition software. I apologize for typographical errors.  Campbell Lerner, MD, FACS [place]: [facility] - Partnering for exceptional care. Office: [phone]

## 2023-05-18 ENCOUNTER — Ambulatory Visit

## 2023-05-18 ENCOUNTER — Ambulatory Visit
Admission: RE | Admit: 2023-05-18 | Discharge: 2023-05-18 | Disposition: A | Discharge status: Home / Self Care | Source: Ambulatory Visit | Attending: Radiation Oncology | Admitting: Radiation Oncology

## 2023-05-18 ENCOUNTER — Other Ambulatory Visit: Payer: Self-pay

## 2023-05-18 DIAGNOSIS — Z510 Encounter for antineoplastic radiation therapy: Secondary | ICD-10-CM | POA: Diagnosis not present

## 2023-05-18 LAB — RAD ONC ARIA SESSION SUMMARY
Course Elapsed Days: 16
Plan Fractions Treated to Date: 13
Plan Prescribed Dose Per Fraction: 2 Gy
Plan Total Fractions Prescribed: 35
Plan Total Prescribed Dose: 70 Gy
Reference Point Dosage Given to Date: 26 Gy
Reference Point Session Dosage Given: 2 Gy
Session Number: 13

## 2023-05-19 ENCOUNTER — Other Ambulatory Visit

## 2023-05-19 ENCOUNTER — Other Ambulatory Visit: Payer: Self-pay

## 2023-05-19 ENCOUNTER — Ambulatory Visit

## 2023-05-19 ENCOUNTER — Ambulatory Visit: Admitting: Oncology

## 2023-05-19 DIAGNOSIS — Z510 Encounter for antineoplastic radiation therapy: Secondary | ICD-10-CM | POA: Diagnosis not present

## 2023-05-19 LAB — RAD ONC ARIA SESSION SUMMARY
Course Elapsed Days: 17
Plan Fractions Treated to Date: 14
Plan Prescribed Dose Per Fraction: 2 Gy
Plan Total Fractions Prescribed: 35
Plan Total Prescribed Dose: 70 Gy
Reference Point Dosage Given to Date: 28 Gy
Reference Point Session Dosage Given: 2 Gy
Session Number: 14

## 2023-05-19 MED FILL — Fosaprepitant Dimeglumine For IV Infusion 150 MG (Base Eq): INTRAVENOUS | Qty: 5 | Status: AC

## 2023-05-20 ENCOUNTER — Inpatient Hospital Stay

## 2023-05-20 ENCOUNTER — Inpatient Hospital Stay (HOSPITAL_BASED_OUTPATIENT_CLINIC_OR_DEPARTMENT_OTHER): Admitting: Oncology

## 2023-05-20 ENCOUNTER — Encounter: Payer: Self-pay | Admitting: Oncology

## 2023-05-20 ENCOUNTER — Ambulatory Visit

## 2023-05-20 ENCOUNTER — Other Ambulatory Visit: Payer: Self-pay

## 2023-05-20 ENCOUNTER — Ambulatory Visit
Admission: RE | Admit: 2023-05-20 | Discharge: 2023-05-20 | Disposition: A | Discharge status: Home / Self Care | Source: Ambulatory Visit | Attending: Radiation Oncology | Admitting: Radiation Oncology

## 2023-05-20 VITALS — BP 158/54 | HR 58 | Temp 97.6°F | Resp 16 | Ht 66.0 in | Wt 121.0 lb

## 2023-05-20 DIAGNOSIS — C099 Malignant neoplasm of tonsil, unspecified: Secondary | ICD-10-CM | POA: Diagnosis not present

## 2023-05-20 DIAGNOSIS — C210 Malignant neoplasm of anus, unspecified: Secondary | ICD-10-CM

## 2023-05-20 DIAGNOSIS — E876 Hypokalemia: Secondary | ICD-10-CM

## 2023-05-20 DIAGNOSIS — Z510 Encounter for antineoplastic radiation therapy: Secondary | ICD-10-CM | POA: Diagnosis not present

## 2023-05-20 LAB — CBC WITH DIFFERENTIAL (CANCER CENTER ONLY)
Abs Immature Granulocytes: 0.02 10*3/uL (ref 0.00–0.07)
Basophils Absolute: 0 10*3/uL (ref 0.0–0.1)
Basophils Relative: 1 %
Eosinophils Absolute: 0 10*3/uL (ref 0.0–0.5)
Eosinophils Relative: 1 %
HCT: 29.9 % — ABNORMAL LOW (ref 36.0–46.0)
Hemoglobin: 9.5 g/dL — ABNORMAL LOW (ref 12.0–15.0)
Immature Granulocytes: 1 %
Lymphocytes Relative: 18 %
Lymphs Abs: 0.4 10*3/uL — ABNORMAL LOW (ref 0.7–4.0)
MCH: 29.1 pg (ref 26.0–34.0)
MCHC: 31.8 g/dL (ref 30.0–36.0)
MCV: 91.4 fL (ref 80.0–100.0)
Monocytes Absolute: 0.3 10*3/uL (ref 0.1–1.0)
Monocytes Relative: 13 %
Neutro Abs: 1.4 10*3/uL — ABNORMAL LOW (ref 1.7–7.7)
Neutrophils Relative %: 66 %
Platelet Count: 73 10*3/uL — ABNORMAL LOW (ref 150–400)
RBC: 3.27 MIL/uL — ABNORMAL LOW (ref 3.87–5.11)
RDW: 17.3 % — ABNORMAL HIGH (ref 11.5–15.5)
WBC Count: 2.1 10*3/uL — ABNORMAL LOW (ref 4.0–10.5)
nRBC: 0 % (ref 0.0–0.2)

## 2023-05-20 LAB — BASIC METABOLIC PANEL - CANCER CENTER ONLY
Anion gap: 7 (ref 5–15)
BUN: 14 mg/dL (ref 8–23)
CO2: 24 mmol/L (ref 22–32)
Calcium: 8.4 mg/dL — ABNORMAL LOW (ref 8.9–10.3)
Chloride: 107 mmol/L (ref 98–111)
Creatinine: 1.01 mg/dL — ABNORMAL HIGH (ref 0.44–1.00)
GFR, Estimated: 55 mL/min — ABNORMAL LOW (ref >60)
Glucose, Bld: 116 mg/dL — ABNORMAL HIGH (ref 70–99)
Potassium: 3.1 mmol/L — ABNORMAL LOW (ref 3.5–5.1)
Sodium: 138 mmol/L (ref 135–145)

## 2023-05-20 LAB — RAD ONC ARIA SESSION SUMMARY
Course Elapsed Days: 18
Plan Fractions Treated to Date: 15
Plan Prescribed Dose Per Fraction: 2 Gy
Plan Total Fractions Prescribed: 35
Plan Total Prescribed Dose: 70 Gy
Reference Point Dosage Given to Date: 30 Gy
Reference Point Session Dosage Given: 2 Gy
Session Number: 15

## 2023-05-20 LAB — MAGNESIUM: Magnesium: 1.4 mg/dL — ABNORMAL LOW (ref 1.7–2.4)

## 2023-05-20 LAB — SAMPLE TO BLOOD BANK

## 2023-05-20 MED ORDER — MAGNESIUM SULFATE 4 GM/100ML IV SOLN
4.0000 g | Freq: Once | INTRAVENOUS | Status: AC
  Administered 2023-05-20: 4 g via INTRAVENOUS
  Filled 2023-05-20: qty 100

## 2023-05-20 MED ORDER — POTASSIUM CHLORIDE 20 MEQ/100ML IV SOLN
20.0000 meq | Freq: Once | INTRAVENOUS | Status: AC
  Administered 2023-05-20: 20 meq via INTRAVENOUS

## 2023-05-20 MED ORDER — HEPARIN SOD (PORK) LOCK FLUSH 100 UNIT/ML IV SOLN
500.0000 [IU] | Freq: Once | INTRAVENOUS | Status: AC
  Administered 2023-05-20: 500 [IU] via INTRAVENOUS
  Filled 2023-05-20: qty 5

## 2023-05-20 NOTE — Progress Notes (Signed)
 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  Patient has completed treatment for anus cancer.  Patient currently on cisplatin and radiation for head and neck cancer.    Met with patient during infusion.  Reports that her appetite is good.  Ate 1/2 of pork chop biscuit this am.  Last night ate spaghetti and salad for dinner.  Lives with son and daughter in law and they help prepare meals.  Lunch yesterday was baked beans and 6 hushpuppies.  Says that vanilla shake causes diarrhea but the chocolate one does not.     Medications: reviewed  Labs: K 3.1, glucose 116, creatinine 1.01, Mag 1.4  Anthropometrics:   Weight 121 lb today  114 lb on 4/4 (? Accuracy)  118 lb 4 oz on radiation scale on 3/26 117 lb on 3/4 129 lb 2 oz on 2/5 137 lb on 1/10 144 lb 8 oz on 12/30   NUTRITION DIAGNOSIS: Unintentional weight loss stable  Severe malnutrition stable   INTERVENTION:  Encouraged snacks between meals of high calorie, high protein foods Encouraged chocolate glucerna shake if it does not cause stomach upset.  Patient asking about strawberry flavor and she can try it along with butter pecan (glucerna original).  Wrote information down for patient and coupons given     MONITORING, EVALUATION, GOAL: Weight trends, intake   NEXT VISIT: Friday, April 18 during infusion  Amorah Sebring B. Elease Hashimoto, CSO, LDN Registered Dietitian [phone]

## 2023-05-20 NOTE — Progress Notes (Signed)
 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  INTERVAL HISTORY: Patient returns to clinic today for further evaluation and consideration of cycle 3 of weekly cisplatin.  She continues to tolerate her treatments well without significant side effects.  She does not complain of pain or dysphagia today.  She has no neurologic complaints.  She denies any recent fevers or illnesses. She has no chest pain, shortness of breath, cough, or hemoptysis.  She denies any nausea, vomiting, constipation, or diarrhea.  She has no melena or hematochezia.  She has no urinary complaints.  Patient offers no further specific complaints today.  REVIEW OF SYSTEMS:   Review of Systems  Constitutional: Negative.  Negative for fever, malaise/fatigue and weight loss.  HENT:  Negative for sore throat.   Respiratory: Negative.  Negative for cough, hemoptysis and shortness of breath.   Cardiovascular: Negative.  Negative for chest pain and leg swelling.  Gastrointestinal: Negative.  Negative for abdominal pain, blood in stool, diarrhea and melena.  Genitourinary: Negative.  Negative for dysuria.  Musculoskeletal: Negative.  Negative for back pain.  Skin: Negative.  Negative for rash.  Neurological: Negative.  Negative for dizziness, focal weakness, weakness and headaches.  Psychiatric/Behavioral: Negative.  The patient is not nervous/anxious.    As per HPI. Otherwise, a complete review of  systems is negative.  PAST MEDICAL HISTORY: Past Medical History:  Diagnosis Date   Anemia    Diabetes mellitus without complication (HCC)    Gout    Hyperlipidemia    Hypertension    Wears dentures    full upper and lower    PAST SURGICAL HISTORY: Past Surgical History:  Procedure Laterality Date   APPENDECTOMY     gallstones  06/12/2009   MICROLARYNGOSCOPY Right 01/28/2023   Procedure: MICRODIRECT LARYNGOSCOPY WITH BIOPSY OF OROPHARYNGEAL MASS;  Surgeon: Linus Salmons, MD;  Location: [facility];  Service: ENT;  Laterality: Right;  Diabetic   PORTACATH PLACEMENT N/A 02/18/2023   Procedure: INSERTION PORT-A-CATH;  Surgeon: Campbell Lerner, MD;  Location: [facility];  Service: General;  Laterality: N/A;   TONSILLECTOMY      FAMILY HISTORY: Family History  Problem Relation Age of Onset   Breast cancer Neg Hx     ADVANCED DIRECTIVES (Y/N):  N  HEALTH MAINTENANCE: Social History   Tobacco Use   Smoking status: Former    Current packs/day: 0.00    Average packs/day: 0.5 packs/day for 8.0 years (4.0 ttl pk-yrs)    Types: Cigarettes    Start date: 23    Quit date: 1990    Years since quitting: 35.2    Passive exposure: Past   Smokeless tobacco: Never  Vaping Use   Vaping status: Never Used  Substance Use Topics   Alcohol use: No    Alcohol/week: 0.0 standard drinks of alcohol   Drug use: No     Colonoscopy:  PAP:  Bone density:  Lipid panel:  No Known Allergies  Current Outpatient Medications  Medication Sig Dispense Refill   Accu-Chek Softclix Lancets lancets  amLODipine (NORVASC) 10 MG tablet Take 10 mg by mouth daily.     aspirin 81 MG tablet Take 1 tablet by mouth daily.     capecitabine (XELODA) 500 MG tablet Take 2 tablets (1,000 mg total) by mouth 2 (two) times daily after a meal. Take Monday-Friday. Take only on days of radiation. 120 tablet 0   carvedilol (COREG) 3.125 MG tablet Take 1 tablet by mouth 2 (two) times daily. cardio      glucose blood (ACCU-CHEK AVIVA PLUS) test strip USE TWICE DAILY AS DIRECTED     lisinopril (ZESTRIL) 20 MG tablet Take 1 tablet (20 mg total) by mouth daily. 30 tablet 0   magic mouthwash (multi-ingredient) oral suspension Swish and swallow 5-10 mLs 4 (four) times daily as needed. 480 mL 3   metFORMIN (GLUCOPHAGE) 500 MG tablet One every morning 90 tablet 1   oxycodone (OXY-IR) 5 MG capsule Take 5 mg by mouth every 4 (four) hours as needed.     tiZANidine (ZANAFLEX) 4 MG tablet Take 1 tablet (4 mg total) by mouth at bedtime. 90 tablet 1   Morphine Sulfate (MORPHINE CONCENTRATE) 10 mg / 0.5 ml concentrated solution Take 1 mL (20 mg total) by mouth every 2 (two) hours as needed for severe pain (pain score 7-10). (Patient not taking: Reported on 05/20/2023) 15 mL 0   No current facility-administered medications for this visit.   Facility-Administered Medications Ordered in Other Visits  Medication Dose Route Frequency Provider Last Rate Last Admin   0.9 %  sodium chloride infusion   Intravenous Continuous Jeralyn Ruths, MD   Stopped at 05/13/23 1535    OBJECTIVE: Vitals:   05/20/23 0915  BP: (!) 158/54  Pulse: (!) 58  Resp: 16  Temp: 97.6 F (36.4 C)  SpO2: 100%       Body mass index is 19.53 kg/m.    ECOG FS:1 - Symptomatic but completely ambulatory  General: Well-developed, well-nourished, no acute distress. Eyes: Pink conjunctiva, anicteric sclera. HEENT: Normocephalic, moist mucous membranes.  No palpable lymphadenopathy. Lungs: No audible wheezing or coughing. Heart: Regular rate and rhythm. Abdomen: Soft, nontender, no obvious distention. Musculoskeletal: No edema, cyanosis, or clubbing. Neuro: Alert, answering all questions appropriately. Cranial nerves grossly intact. Skin: No rashes or petechiae noted. Psych: Normal affect.  LAB RESULTS:  Lab Results  Component Value Date   NA 138 05/20/2023   K 3.1 (L) 05/20/2023   CL 107 05/20/2023   CO2 24 05/20/2023    GLUCOSE 116 (H) 05/20/2023   BUN 14 05/20/2023   CREATININE 1.01 (H) 05/20/2023   CALCIUM 8.4 (L) 05/20/2023   PROT 6.8 04/12/2023   ALBUMIN 3.5 04/12/2023   AST 22 04/12/2023   ALT 8 04/12/2023   ALKPHOS 39 04/12/2023   BILITOT 0.9 04/12/2023   GFRNONAA 55 (L) 05/20/2023   GFRAA 59 (L) 09/24/2019    Lab Results  Component Value Date   WBC 2.1 (L) 05/20/2023   NEUTROABS 1.4 (L) 05/20/2023   HGB 9.5 (L) 05/20/2023   HCT 29.9 (L) 05/20/2023   MCV 91.4 05/20/2023   PLT 73 (L) 05/20/2023     STUDIES: No results found.    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  Patient has now completed concurrent XRT along with chemotherapy.  Her last dose of mitomycin was on March 07, 2023.  She finished Xeloda and XRT on March 25, 2023.  Repeat PET scan results from April 07, 2023 reviewed independently and reported as above with persistent hypermetabolism at the site of her anal cancer, but PET scan was done sooner than required given her head and neck cancer and this may be residual inflammation/irritation from XRT.  Appreciate surgical input. 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  Patient is now more symptomatic with difficulty swallowing and pain.  Plan to give concurrent XRT along with weekly cisplatin.  Delay cycle 3 of dose used cisplatin today secondary to thrombocytopenia.  Return to clinic in 1 week for further evaluation and reconsideration of cycle 3.  If patient's GFR continues to improve, will increase dose of cisplatin.     Thyroid nodule with hypermetabolism: Defer ultrasound and biopsy until completion of treatment of 2  malignancies above. Anemia: Hemoglobin improved to 9.5.  Monitor. Leukopenia: Chronic and unchanged.  Patient's total white blood cell count is 2.1.  Delay treatment as above. Thrombocytopenia: Platelets are 73 today.  Delay treatment as above. Hypomagnesia: Patient will receive 4 g IV magnesium today.  Continue 2 g IV magnesium with treatments.   Hypokalemia: Patient will receive 20 mEq IV potassium today.  Continue IV potassium supplementation along with treatments as well.  Renal insufficiency: Patient's creatinine is improved to 1.01.  Consider increasing dose of cisplatin next week if creatinine remains stable.   Dysphagia: Continue oxycodone 5 mg as needed.   Patient expressed understanding and was in agreement with this plan. She also understands that She can call clinic at any time with any questions, concerns, or complaints.    Cancer Staging  Anal squamous cell carcinoma (HCC) Staging form: Anus, AJCC V9 - Clinical stage from 01/18/2023: Stage IIIA (cT3, cN0, cM0) - Signed by Jeralyn Ruths, MD on 01/18/2023 Stage prefix: Initial diagnosis  Right tonsillar squamous cell carcinoma (HCC) Staging form: Pharynx - P16 Negative Oropharynx, AJCC 8th Edition - Clinical stage from 04/12/2023: Stage III (cT3, cN1, cM0, p16: Unknown) - Signed by Jeralyn Ruths, MD on 04/12/2023 Stage prefix: Initial diagnosis   Jeralyn Ruths, MD   05/20/2023 1:07 PM

## 2023-05-23 ENCOUNTER — Ambulatory Visit: Admitting: Oncology

## 2023-05-23 ENCOUNTER — Other Ambulatory Visit: Payer: Self-pay | Admitting: *Deleted

## 2023-05-23 ENCOUNTER — Other Ambulatory Visit

## 2023-05-23 ENCOUNTER — Ambulatory Visit (INDEPENDENT_AMBULATORY_CARE_PROVIDER_SITE_OTHER): Admitting: Family Medicine

## 2023-05-23 ENCOUNTER — Encounter: Payer: Self-pay | Admitting: Family Medicine

## 2023-05-23 ENCOUNTER — Other Ambulatory Visit: Payer: Self-pay

## 2023-05-23 ENCOUNTER — Ambulatory Visit

## 2023-05-23 ENCOUNTER — Ambulatory Visit
Admission: RE | Admit: 2023-05-23 | Discharge: 2023-05-23 | Disposition: A | Discharge status: Home / Self Care | Source: Ambulatory Visit | Attending: Radiation Oncology | Admitting: Radiation Oncology

## 2023-05-23 ENCOUNTER — Inpatient Hospital Stay

## 2023-05-23 VITALS — BP 138/60 | HR 58 | Resp 16 | Ht 66.0 in | Wt 122.0 lb

## 2023-05-23 DIAGNOSIS — Z510 Encounter for antineoplastic radiation therapy: Secondary | ICD-10-CM | POA: Diagnosis not present

## 2023-05-23 DIAGNOSIS — I10 Essential (primary) hypertension: Secondary | ICD-10-CM | POA: Diagnosis not present

## 2023-05-23 LAB — RAD ONC ARIA SESSION SUMMARY
Course Elapsed Days: 21
Plan Fractions Treated to Date: 16
Plan Prescribed Dose Per Fraction: 2 Gy
Plan Total Fractions Prescribed: 35
Plan Total Prescribed Dose: 70 Gy
Reference Point Dosage Given to Date: 32 Gy
Reference Point Session Dosage Given: 2 Gy
Session Number: 16

## 2023-05-23 MED ORDER — SUCRALFATE 1 G PO TABS: 1.0000 g | ORAL_TABLET | Freq: Three times a day (TID) | ORAL | 0 refills | Status: DC

## 2023-05-23 NOTE — Progress Notes (Signed)
 Date:  05/23/2023   Name:  April [name]   DOB:  Sep 12, 1940   MRN:  [id number]   Chief Complaint: Hypertension and Diabetes  Hypertension This is a chronic problem. The current episode started more than 1 year ago. The problem has been gradually improving since onset. The problem is controlled. Pertinent negatives include no anxiety, blurred vision, chest pain, headaches, malaise/fatigue, neck pain, orthopnea, palpitations, peripheral edema, PND, shortness of breath or sweats. There are no associated agents to hypertension. Risk factors for coronary artery disease include dyslipidemia.  Diabetes Pertinent negatives for hypoglycemia include no headaches or sweats. Pertinent negatives for diabetes include no blurred vision, no chest pain and no fatigue.    Lab Results  Component Value Date   NA 138 05/20/2023   K 3.1 (L) 05/20/2023   CO2 24 05/20/2023   GLUCOSE 116 (H) 05/20/2023   BUN 14 05/20/2023   CREATININE 1.01 (H) 05/20/2023   CALCIUM 8.4 (L) 05/20/2023   EGFR 48 (L) 06/01/2022   GFRNONAA 55 (L) 05/20/2023   Lab Results  Component Value Date   CHOL 128 12/02/2022   HDL 47 12/02/2022   LDLCALC 65 12/02/2022   TRIG 84 12/02/2022   CHOLHDL 3.1 09/24/2019   Lab Results  Component Value Date   TSH 0.98 07/24/2021   Lab Results  Component Value Date   HGBA1C 6.6 04/13/2022   Lab Results  Component Value Date   WBC 2.1 (L) 05/20/2023   HGB 9.5 (L) 05/20/2023   HCT 29.9 (L) 05/20/2023   MCV 91.4 05/20/2023   PLT 73 (L) 05/20/2023   Lab Results  Component Value Date   ALT 8 04/12/2023   AST 22 04/12/2023   ALKPHOS 39 04/12/2023   BILITOT 0.9 04/12/2023   No results found for: "25OHVITD2", "25OHVITD3", "VD25OH"   Review of Systems  Constitutional:  Negative for chills, fatigue, malaise/fatigue and unexpected weight change.  Eyes:  Negative for blurred vision and visual disturbance.  Respiratory:  Negative for choking, chest tightness, shortness of breath and  wheezing.   Cardiovascular:  Negative for chest pain, palpitations, orthopnea, leg swelling and PND.  Musculoskeletal:  Negative for neck pain.  Neurological:  Negative for headaches.    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   Alimentary obesity 06/20/2014   Essential hypertension 06/20/2014   Routine sports examination 06/20/2014   Health examination of defined subpopulation 06/20/2014   Hyperlipidemia due to type 2 diabetes mellitus (HCC) 06/20/2014    No Known Allergies  Past Surgical History:  Procedure Laterality Date   APPENDECTOMY     gallstones  06/12/2009   MICROLARYNGOSCOPY Right 01/28/2023   Procedure: MICRODIRECT LARYNGOSCOPY WITH BIOPSY OF OROPHARYNGEAL MASS;  Surgeon: Linus Salmons, MD;  Location: [facility];  Service: ENT;  Laterality: Right;  Diabetic   PORTACATH PLACEMENT N/A 02/18/2023   Procedure: INSERTION PORT-A-CATH;  Surgeon: Campbell Lerner, MD;  Location: [facility];  Service: General;  Laterality: N/A;   TONSILLECTOMY      Social History   Tobacco  Use   Smoking status: Former    Current packs/day: 0.00    Average packs/day: 0.5 packs/day for 8.0 years (4.0 ttl pk-yrs)    Types: Cigarettes    Start date: 31    Quit date: 17    Years since quitting: 35.3    Passive exposure: Past   Smokeless tobacco: Never  Vaping Use   Vaping status: Never Used  Substance Use Topics   Alcohol use: No    Alcohol/week: 0.0 standard  drinks of alcohol   Drug use: No     Medication list has been reviewed and updated.  Current Meds  Medication Sig   Accu-Chek Softclix Lancets lancets    amLODipine (NORVASC) 10 MG tablet Take 10 mg by mouth daily.   aspirin 81 MG tablet Take 1 tablet by mouth daily.   capecitabine (XELODA) 500 MG tablet Take 2 tablets (1,000 mg total) by mouth 2 (two) times daily after a meal. Take Monday-Friday. Take only on days of radiation.   carvedilol (COREG) 3.125 MG tablet Take 1 tablet by mouth 2 (two) times daily. cardio   glucose blood (ACCU-CHEK AVIVA PLUS) test strip USE TWICE DAILY AS DIRECTED   lisinopril (ZESTRIL) 20 MG tablet Take 1 tablet (20 mg total) by mouth daily.   magic mouthwash (multi-ingredient) oral suspension Swish and swallow 5-10 mLs 4 (four) times daily as needed.   metFORMIN (GLUCOPHAGE) 500 MG tablet One every morning   Morphine Sulfate (MORPHINE CONCENTRATE) 10 mg / 0.5 ml concentrated solution Take 1 mL (20 mg total) by mouth every 2 (two) hours as needed for severe pain (pain score 7-10).   oxycodone (OXY-IR) 5 MG capsule Take 5 mg by mouth every 4 (four) hours as needed.   tiZANidine (ZANAFLEX) 4 MG tablet Take 1 tablet (4 mg total) by mouth at bedtime.       05/12/2023   10:14 AM 01/04/2023   10:44 AM 12/02/2022    9:44 AM 08/27/2022    3:03 PM  GAD 7 : Generalized Anxiety Score  Nervous, Anxious, on Edge 0 0 0 0  Control/stop worrying 0 0 0 0  Worry too much - different things  0 0 0  Trouble relaxing  0 0 0  Restless  0 0 0  Easily annoyed or irritable  0 0 0  Afraid - awful might happen  0 0 0  Total GAD 7 Score  0 0 0  Anxiety Difficulty  Not difficult at all Not difficult at all Not difficult at all       05/12/2023   10:14 AM 01/04/2023   10:44 AM 12/02/2022    9:43 AM  Depression screen PHQ 2/9  Decreased Interest 0 0 0  Down, Depressed, Hopeless 0 0 0  PHQ - 2 Score 0 0 0  Altered sleeping  0 0  Tired, decreased energy  0 0  Change in  appetite  0 0  Feeling bad or failure about yourself   0 0  Trouble concentrating  0 0  Moving slowly or fidgety/restless  0 0  Suicidal thoughts  0 0  PHQ-9 Score  0 0  Difficult doing work/chores  Not difficult at all Not difficult at all    BP Readings from Last 3 Encounters:  05/23/23 (!) 140/61  05/20/23 (!) 151/53  05/20/23 (!) 158/54    Physical Exam Vitals and nursing note reviewed.  Constitutional:      General: She is not in acute distress.  Appearance: She is not diaphoretic.  HENT:     Head: Normocephalic and atraumatic.     Right Ear: External ear normal.     Left Ear: External ear normal.     Nose: Nose normal.  Eyes:     General:        Right eye: No discharge.        Left eye: No discharge.     Conjunctiva/sclera: Conjunctivae normal.     Pupils: Pupils are equal, round, and reactive to light.  Neck:     Thyroid: No thyromegaly.     Vascular: No JVD.  Cardiovascular:     Rate and Rhythm: Normal rate and regular rhythm.     Heart sounds: Normal heart sounds, S1 normal and S2 normal. No murmur heard.    No systolic murmur is present.     No diastolic murmur is present.     No friction rub. No gallop. No S3 or S4 sounds.  Pulmonary:     Effort: Pulmonary effort is normal.     Breath sounds: Normal breath sounds. No wheezing, rhonchi or rales.  Abdominal:     General: Bowel sounds are normal.     Palpations: Abdomen is soft. There is no mass.     Tenderness: There is no abdominal tenderness. There is no guarding.  Musculoskeletal:        General: Normal range of motion.     Cervical back: Normal range of motion and neck supple.  Lymphadenopathy:     Cervical: No cervical adenopathy.  Skin:    General: Skin is warm and dry.  Neurological:     Mental Status: She is alert.     Wt Readings from Last 3 Encounters:  05/23/23 122 lb (55.3 kg)  05/20/23 121 lb (54.9 kg)  05/17/23 121 lb 3.2 oz (55 kg)    BP (!) 140/61   Pulse (!) 58   Resp 16    Ht 5\' 6"  (1.676 m)   Wt 122 lb (55.3 kg)   SpO2 98%   BMI 19.69 kg/m   Assessment and Plan: 1. Essential hypertension (Primary) Chronic.  Controlled.  Stable.  Blood pressure today is 138/60.  Asymptomatic.  Initially was evaluated on combination with a blood pressure reading of 108/64 tolerating medications well.  Previously was on combination of lisinopril and hydrochlorothiazide 20-12.5 mg in which time we decided to unbundled the ACE and diuretic and placed on a reduced dosing of lisinopril we will going to place her on 5 mg of lisinopril however patient did not pick up her medication and because blood pressure had spiked upwards with a blood pressure of 158/60.  At the risk of lowering the diastolic which is feeling pressure for the heart we needed to bring the systolic down so we resumed her lisinopril at previous dosing of 20 mg..  Patient was resumed on her Zestril at 20 mg and where was rechecked within 7 to 10 days.  Patient was encouraged to bring her medication but only brought her statins and she had 2 bottles that she needs to throw away and that.  On evaluation today's blood pressure is acceptable 138/60 perfusing well at systolic 138 but not too high and not dropping the diastolic preload.  We will check renal function panel for current electrolytes and GFR and patient is also being followed by nephrology at this time.  Patient has an upcoming appointment this week with Dr. Spence Dux for consideration of metformin. - Renal Function  Panel     Alayne Allis, MD

## 2023-05-24 ENCOUNTER — Other Ambulatory Visit: Payer: Self-pay

## 2023-05-24 ENCOUNTER — Ambulatory Visit
Admission: RE | Admit: 2023-05-24 | Discharge: 2023-05-24 | Disposition: A | Discharge status: Home / Self Care | Source: Ambulatory Visit | Attending: Radiation Oncology | Admitting: Radiation Oncology

## 2023-05-24 ENCOUNTER — Ambulatory Visit

## 2023-05-24 ENCOUNTER — Encounter: Payer: Self-pay | Admitting: Family Medicine

## 2023-05-24 ENCOUNTER — Encounter: Payer: Self-pay | Admitting: Oncology

## 2023-05-24 DIAGNOSIS — Z510 Encounter for antineoplastic radiation therapy: Secondary | ICD-10-CM | POA: Diagnosis not present

## 2023-05-24 LAB — RENAL FUNCTION PANEL
Albumin: 3.5 g/dL — ABNORMAL LOW (ref 3.7–4.7)
BUN/Creatinine Ratio: 10 — ABNORMAL LOW (ref 12–28)
BUN: 9 mg/dL (ref 8–27)
CO2: 24 mmol/L (ref 20–29)
Calcium: 8.6 mg/dL — ABNORMAL LOW (ref 8.7–10.3)
Chloride: 106 mmol/L (ref 96–106)
Creatinine, Ser: 0.87 mg/dL (ref 0.57–1.00)
Glucose: 82 mg/dL (ref 70–99)
Phosphorus: 2.9 mg/dL — ABNORMAL LOW (ref 3.0–4.3)
Potassium: 3.8 mmol/L (ref 3.5–5.2)
Sodium: 142 mmol/L (ref 134–144)
eGFR: 66 mL/min/{1.73_m2} (ref >59)

## 2023-05-24 LAB — RAD ONC ARIA SESSION SUMMARY
Course Elapsed Days: 22
Plan Fractions Treated to Date: 17
Plan Prescribed Dose Per Fraction: 2 Gy
Plan Total Fractions Prescribed: 35
Plan Total Prescribed Dose: 70 Gy
Reference Point Dosage Given to Date: 34 Gy
Reference Point Session Dosage Given: 2 Gy
Session Number: 17

## 2023-05-25 ENCOUNTER — Ambulatory Visit

## 2023-05-26 ENCOUNTER — Other Ambulatory Visit

## 2023-05-26 ENCOUNTER — Ambulatory Visit: Admitting: Oncology

## 2023-05-26 ENCOUNTER — Ambulatory Visit

## 2023-05-26 ENCOUNTER — Other Ambulatory Visit: Payer: Self-pay | Admitting: *Deleted

## 2023-05-26 DIAGNOSIS — C109 Malignant neoplasm of oropharynx, unspecified: Secondary | ICD-10-CM

## 2023-05-26 MED FILL — Fosaprepitant Dimeglumine For IV Infusion 150 MG (Base Eq): INTRAVENOUS | Qty: 5 | Status: AC

## 2023-05-27 ENCOUNTER — Ambulatory Visit

## 2023-05-27 ENCOUNTER — Inpatient Hospital Stay

## 2023-05-27 ENCOUNTER — Encounter: Payer: Self-pay | Admitting: Oncology

## 2023-05-27 ENCOUNTER — Other Ambulatory Visit: Payer: Self-pay

## 2023-05-27 ENCOUNTER — Inpatient Hospital Stay (HOSPITAL_BASED_OUTPATIENT_CLINIC_OR_DEPARTMENT_OTHER): Admitting: Oncology

## 2023-05-27 ENCOUNTER — Other Ambulatory Visit: Payer: Self-pay | Admitting: *Deleted

## 2023-05-27 VITALS — BP 133/50 | HR 60 | Temp 98.4°F

## 2023-05-27 VITALS — BP 153/61 | HR 69 | Temp 98.0°F | Resp 16 | Ht 66.0 in | Wt 114.0 lb

## 2023-05-27 DIAGNOSIS — C210 Malignant neoplasm of anus, unspecified: Secondary | ICD-10-CM

## 2023-05-27 DIAGNOSIS — C099 Malignant neoplasm of tonsil, unspecified: Secondary | ICD-10-CM | POA: Diagnosis not present

## 2023-05-27 DIAGNOSIS — Z510 Encounter for antineoplastic radiation therapy: Secondary | ICD-10-CM | POA: Diagnosis not present

## 2023-05-27 LAB — CBC WITH DIFFERENTIAL (CANCER CENTER ONLY)
Abs Immature Granulocytes: 0.01 10*3/uL (ref 0.00–0.07)
Basophils Absolute: 0 10*3/uL (ref 0.0–0.1)
Basophils Relative: 1 %
Eosinophils Absolute: 0 10*3/uL (ref 0.0–0.5)
Eosinophils Relative: 1 %
HCT: 28.2 % — ABNORMAL LOW (ref 36.0–46.0)
Hemoglobin: 8.7 g/dL — ABNORMAL LOW (ref 12.0–15.0)
Immature Granulocytes: 1 %
Lymphocytes Relative: 16 %
Lymphs Abs: 0.3 10*3/uL — ABNORMAL LOW (ref 0.7–4.0)
MCH: 28.7 pg (ref 26.0–34.0)
MCHC: 30.9 g/dL (ref 30.0–36.0)
MCV: 93.1 fL (ref 80.0–100.0)
Monocytes Absolute: 0.2 10*3/uL (ref 0.1–1.0)
Monocytes Relative: 11 %
Neutro Abs: 1.5 10*3/uL — ABNORMAL LOW (ref 1.7–7.7)
Neutrophils Relative %: 70 %
Platelet Count: 66 10*3/uL — ABNORMAL LOW (ref 150–400)
RBC: 3.03 MIL/uL — ABNORMAL LOW (ref 3.87–5.11)
RDW: 17.1 % — ABNORMAL HIGH (ref 11.5–15.5)
WBC Count: 2.2 10*3/uL — ABNORMAL LOW (ref 4.0–10.5)
nRBC: 0 % (ref 0.0–0.2)

## 2023-05-27 LAB — MAGNESIUM: Magnesium: 1.5 mg/dL — ABNORMAL LOW (ref 1.7–2.4)

## 2023-05-27 LAB — BASIC METABOLIC PANEL - CANCER CENTER ONLY
Anion gap: 10 (ref 5–15)
BUN: 17 mg/dL (ref 8–23)
CO2: 23 mmol/L (ref 22–32)
Calcium: 8.7 mg/dL — ABNORMAL LOW (ref 8.9–10.3)
Chloride: 105 mmol/L (ref 98–111)
Creatinine: 1.17 mg/dL — ABNORMAL HIGH (ref 0.44–1.00)
GFR, Estimated: 46 mL/min — ABNORMAL LOW (ref >60)
Glucose, Bld: 95 mg/dL (ref 70–99)
Potassium: 3.7 mmol/L (ref 3.5–5.1)
Sodium: 138 mmol/L (ref 135–145)

## 2023-05-27 LAB — SAMPLE TO BLOOD BANK

## 2023-05-27 MED ORDER — STERILE WATER FOR INJECTION IJ SOLN
5.0000 mL | Freq: Four times a day (QID) | ORAL | 3 refills | Status: DC | PRN
Start: 2023-05-27 — End: 2023-06-22
  Filled 2023-05-27: qty 480, 12d supply, fill #0
  Filled 2023-06-16: qty 480, 12d supply, fill #1

## 2023-05-27 MED ORDER — MAGNESIUM SULFATE 4 GM/100ML IV SOLN
4.0000 g | Freq: Once | INTRAVENOUS | Status: AC
  Administered 2023-05-27: 4 g via INTRAVENOUS
  Filled 2023-05-27: qty 100

## 2023-05-27 MED ORDER — HEPARIN SOD (PORK) LOCK FLUSH 100 UNIT/ML IV SOLN
500.0000 [IU] | Freq: Once | INTRAVENOUS | Status: AC
  Administered 2023-05-27: 500 [IU] via INTRAVENOUS
  Filled 2023-05-27: qty 5

## 2023-05-27 MED ORDER — MORPHINE SULFATE (PF) 2 MG/ML IV SOLN
1.0000 mg | Freq: Once | INTRAVENOUS | Status: AC
  Administered 2023-05-27: 1 mg via INTRAVENOUS

## 2023-05-27 MED ORDER — SODIUM CHLORIDE 0.9 % IV SOLN
Freq: Once | INTRAVENOUS | Status: AC
  Filled 2023-05-27: qty 250

## 2023-05-27 MED ORDER — MORPHINE SULFATE (PF) 2 MG/ML IV SOLN
INTRAVENOUS | Status: AC
  Filled 2023-05-27: qty 1

## 2023-05-27 MED ORDER — OXYCODONE HCL 5 MG/5ML PO SOLN: 5.0000 mg | ORAL | 0 refills | Status: DC | PRN

## 2023-05-27 NOTE — Progress Notes (Signed)
 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  Patient has completed treatment for anus cancer.  Now receiving cisplatin  and radiation for head and neck cancer.   Met with patient during infusion.  Reports that appetite is decreased due to no taste.  Says that she drank some orange juice this am.  Can't remember what she ate yesterday.  Wanting to try some strawberry flavored protein shakes.      Medications: Noted PCP prescribed mirtazapine   Labs: reviewed  Anthropometrics:   Weight 114 lb (? Accuracy) 122 lb on 4/14 121 lb on 4/11 121 lb on 4/8   NUTRITION DIAGNOSIS: Unintentional weight loss continues   MALNUTRITION DIAGNOSIS: Severe malnutrition continues   INTERVENTION:  Patient has not purchased strawberry flavored glucerna shake at this time.  Encouraged patient to start drinking shakes, as often as tolerated If weight continues to decline consider feeding tube placement     MONITORING, EVALUATION, GOAL: weight trends, intake   NEXT VISIT: Friday, April 25 during infusion  Eufemio Strahm B. Dasie SOLON, CSO, LDN Registered Dietitian [phone]

## 2023-05-27 NOTE — Progress Notes (Signed)
 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  INTERVAL HISTORY: Patient returns to clinic today for further evaluation and reconsideration of cycle 3 of weekly cisplatin .  She has chronic weakness and fatigue.  She also has a poor appetite secondary to altered taste and mild dysphagia. She has no neurologic complaints.  She denies any recent fevers or illnesses. She has no chest pain, shortness of breath, cough, or hemoptysis.  She denies any nausea, vomiting, constipation, or diarrhea.  She has no melena or hematochezia.  She has no urinary complaints.  Patient offers no further specific complaints today.  REVIEW OF SYSTEMS:   Review of Systems  Constitutional:  Positive for malaise/fatigue. Negative for fever and weight loss.  HENT:  Negative for sore throat.   Respiratory: Negative.  Negative for cough, hemoptysis and shortness of breath.   Cardiovascular: Negative.  Negative for chest pain and leg swelling.  Gastrointestinal: Negative.  Negative for abdominal pain, blood in stool, diarrhea and melena.  Genitourinary: Negative.  Negative for dysuria.  Musculoskeletal: Negative.  Negative for back pain.  Skin: Negative.  Negative for rash.  Neurological:  Positive for weakness. Negative for dizziness, focal weakness and headaches.  Psychiatric/Behavioral: Negative.  The patient is not nervous/anxious.    As per HPI. Otherwise, a complete review of systems is  negative.  PAST MEDICAL HISTORY: Past Medical History:  Diagnosis Date   Anemia    Diabetes mellitus without complication (HCC)    Gout    Hyperlipidemia    Hypertension    Wears dentures    full upper and lower    PAST SURGICAL HISTORY: Past Surgical History:  Procedure Laterality Date   APPENDECTOMY     gallstones  06/12/2009   MICROLARYNGOSCOPY Right 01/28/2023   Procedure: MICRODIRECT LARYNGOSCOPY WITH BIOPSY OF OROPHARYNGEAL MASS;  Surgeon: Lesly Raspberry, MD;  Location: [facility];  Service: ENT;  Laterality: Right;  Diabetic   PORTACATH PLACEMENT N/A 02/18/2023   Procedure: INSERTION PORT-A-CATH;  Surgeon: Flynn Hylan, MD;  Location: [facility];  Service: General;  Laterality: N/A;   TONSILLECTOMY      FAMILY HISTORY: Family History  Problem Relation Age of Onset   Breast cancer Neg Hx     ADVANCED DIRECTIVES (Y/N):  N  HEALTH MAINTENANCE: Social History   Tobacco Use   Smoking status: Former    Current packs/day: 0.00    Average packs/day: 0.5 packs/day for 8.0 years (4.0 ttl pk-yrs)    Types: Cigarettes    Start date: 22    Quit date: 1990    Years since quitting: 35.3    Passive exposure: Past   Smokeless tobacco: Never  Vaping Use   Vaping status: Never Used  Substance Use Topics   Alcohol use: No    Alcohol/week: 0.0 standard drinks of alcohol   Drug use: No     Colonoscopy:  PAP:  Bone density:  Lipid panel:  No Known Allergies  Current Outpatient Medications  Medication Sig Dispense Refill   Accu-Chek Softclix Lancets lancets  amLODipine (NORVASC) 10 MG tablet Take 10 mg by mouth daily.     aspirin 81 MG tablet Take 1 tablet by mouth daily.     capecitabine  (XELODA ) 500 MG tablet Take 2 tablets (1,000 mg total) by mouth 2 (two) times daily after a meal. Take Monday-Friday. Take only on days of radiation. 120 tablet 0   carvedilol  (COREG ) 3.125 MG tablet Take 1 tablet by mouth 2 (two) times daily. cardio     glucose  blood (ACCU-CHEK AVIVA PLUS) test strip USE TWICE DAILY AS DIRECTED     lisinopril  (ZESTRIL ) 20 MG tablet Take 1 tablet (20 mg total) by mouth daily. 30 tablet 0   metFORMIN  (GLUCOPHAGE ) 500 MG tablet One every morning 90 tablet 1   Morphine  Sulfate (MORPHINE  CONCENTRATE) 10 mg / 0.5 ml concentrated solution Take 1 mL (20 mg total) by mouth every 2 (two) hours as needed for severe pain (pain score 7-10). 15 mL 0   oxycodone  (OXY-IR) 5 MG capsule Take 5 mg by mouth every 4 (four) hours as needed.     tiZANidine  (ZANAFLEX ) 4 MG tablet Take 1 tablet (4 mg total) by mouth at bedtime. 90 tablet 1   magic mouthwash (multi-ingredient) oral suspension Swish and swallow 5-10 mLs 4 (four) times daily as needed. 480 mL 3   sucralfate  (CARAFATE ) 1 g tablet Take 1 tablet (1 g total) by mouth 4 (four) times daily -  with meals and at bedtime. Dissolve tablet in 4 tablespoons of warm water  then swish and swallow 30 minutes before meals. (Patient not taking: Reported on 05/27/2023) 120 tablet 0   No current facility-administered medications for this visit.   Facility-Administered Medications Ordered in Other Visits  Medication Dose Route Frequency Provider Last Rate Last Admin   0.9 %  sodium chloride  infusion   Intravenous Continuous Chessa Barrasso J, MD   Stopped at 05/13/23 1535   0.9 %  sodium chloride  infusion   Intravenous Once Derron Pipkins J, MD       magnesium  sulfate IVPB 4 g 100 mL  4 g Intravenous Once Jerrard Bradburn J, MD       morphine  (PF) 2 MG/ML injection             OBJECTIVE: Vitals:   05/27/23 0855  BP: (!) 153/61  Pulse: 69  Resp: 16  Temp: 98 F (36.7 C)  SpO2: 98%       Body mass index is 18.4 kg/m.    ECOG FS:2 - Symptomatic, <50% confined to bed  General: Well-developed, well-nourished, no acute distress. Eyes: Pink conjunctiva, anicteric sclera. HEENT: Normocephalic, moist mucous membranes. Lungs: No audible wheezing or coughing. Heart: Regular rate and  rhythm. Abdomen: Soft, nontender, no obvious distention. Musculoskeletal: No edema, cyanosis, or clubbing. Neuro: Alert, answering all questions appropriately. Cranial nerves grossly intact. Skin: No rashes or petechiae noted. Psych: Normal affect.  LAB RESULTS:  Lab Results  Component Value Date   NA 138 05/27/2023   K 3.7 05/27/2023   CL 105 05/27/2023   CO2 23 05/27/2023   GLUCOSE 95 05/27/2023   BUN 17 05/27/2023   CREATININE 1.17 (H) 05/27/2023   CALCIUM 8.7 (L) 05/27/2023   PROT 6.8 04/12/2023   ALBUMIN 3.5 (L) 05/23/2023   AST 22 04/12/2023   ALT 8 04/12/2023   ALKPHOS 39 04/12/2023   BILITOT 0.9 04/12/2023   GFRNONAA 46 (L) 05/27/2023   GFRAA 59 (L) 09/24/2019    Lab Results  Component Value Date  WBC 2.2 (L) 05/27/2023   NEUTROABS 1.5 (L) 05/27/2023   HGB 8.7 (L) 05/27/2023   HCT 28.2 (L) 05/27/2023   MCV 93.1 05/27/2023   PLT 66 (L) 05/27/2023     STUDIES: No results found.  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  Patient has now completed concurrent XRT along with chemotherapy.  Her last dose of mitomycin  was on March 07, 2023.  She finished Xeloda  and XRT on March 25, 2023.  Repeat PET scan results from April 07, 2023 reviewed independently and reported as above with persistent hypermetabolism at the site of her anal cancer, but PET scan was done sooner than required given her head and neck cancer and this may be residual inflammation/irritation from XRT.  Appreciate surgical input. 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  Plan to give concurrent XRT along with weekly cisplatin .  Will once again delay cycle  3 of dose reduced cisplatin  secondary to ongoing thrombocytopenia.  Patient will instead receive 1 L of fluids and 4 g of IV magnesium  today.  Return to clinic in 1 week for further evaluation and reconsideration of cycle 3.  If patient's GFR continues to improve, will increase dose of cisplatin .     Thyroid  nodule with hypermetabolism: Defer ultrasound and biopsy until completion of treatment of 2 malignancies above. Anemia: Hemoglobin has trended down slightly to 8.7.  Monitor. Leukopenia: Chronic and unchanged.  Patient's total white blood cell count is 2.2 today.  Delay treatment as above. Thrombocytopenia: Platelet trended down to 66.  Delay treatment as above. Hypomagnesia: Proceed with 4 g IV magnesium  as above.  Continue 2 g IV magnesium  with treatments.   Hypokalemia: Resolved.  Continue IV potassium supplementation along with treatments as well.  Renal insufficiency: Creatinine slightly worse today.  IV fluids as above.  Consider increasing dose of cisplatin  next week if creatinine remains stable.   Dysphagia: Continue oxycodone  5 mg as needed.  Patient was also given a refill of her Magic mouthwash today.   Patient expressed understanding and was in agreement with this plan. She also understands that She can call clinic at any time with any questions, concerns, or complaints.    Cancer Staging  Anal squamous cell carcinoma (HCC) Staging form: Anus, AJCC V9 - Clinical stage from 01/18/2023: Stage IIIA (cT3, cN0, cM0) - Signed by Shellie Dials, MD on 01/18/2023 Stage prefix: Initial diagnosis  Right tonsillar squamous cell carcinoma (HCC) Staging form: Pharynx - P16 Negative Oropharynx, AJCC 8th Edition - Clinical stage from 04/12/2023: Stage III (cT3, cN1, cM0, p16: Unknown) - Signed by Shellie Dials, MD on 04/12/2023 Stage prefix: Initial diagnosis   Shellie Dials, MD   05/27/2023 9:35 AM

## 2023-05-30 ENCOUNTER — Ambulatory Visit

## 2023-05-30 ENCOUNTER — Inpatient Hospital Stay

## 2023-05-30 ENCOUNTER — Other Ambulatory Visit

## 2023-05-30 ENCOUNTER — Other Ambulatory Visit: Payer: Self-pay | Admitting: *Deleted

## 2023-05-30 ENCOUNTER — Ambulatory Visit: Admitting: Oncology

## 2023-05-30 ENCOUNTER — Ambulatory Visit: Admission: RE | Admit: 2023-05-30 | Source: Ambulatory Visit

## 2023-05-30 DIAGNOSIS — Z510 Encounter for antineoplastic radiation therapy: Secondary | ICD-10-CM | POA: Diagnosis not present

## 2023-05-30 DIAGNOSIS — C109 Malignant neoplasm of oropharynx, unspecified: Secondary | ICD-10-CM

## 2023-05-30 LAB — CBC (CANCER CENTER ONLY)
HCT: 27.2 % — ABNORMAL LOW (ref 36.0–46.0)
Hemoglobin: 8.5 g/dL — ABNORMAL LOW (ref 12.0–15.0)
MCH: 29.3 pg (ref 26.0–34.0)
MCHC: 31.3 g/dL (ref 30.0–36.0)
MCV: 93.8 fL (ref 80.0–100.0)
Platelet Count: 64 10*3/uL — ABNORMAL LOW (ref 150–400)
RBC: 2.9 MIL/uL — ABNORMAL LOW (ref 3.87–5.11)
RDW: 17.2 % — ABNORMAL HIGH (ref 11.5–15.5)
WBC Count: 2.3 10*3/uL — ABNORMAL LOW (ref 4.0–10.5)
nRBC: 0 % (ref 0.0–0.2)

## 2023-05-30 MED ORDER — DIPHENOXYLATE-ATROPINE 2.5-0.025 MG PO TABS: 1.0000 | ORAL_TABLET | Freq: Four times a day (QID) | ORAL | 0 refills | Status: DC | PRN

## 2023-05-31 ENCOUNTER — Ambulatory Visit

## 2023-06-01 ENCOUNTER — Ambulatory Visit

## 2023-06-02 ENCOUNTER — Inpatient Hospital Stay

## 2023-06-02 ENCOUNTER — Other Ambulatory Visit: Payer: Self-pay | Admitting: *Deleted

## 2023-06-02 ENCOUNTER — Ambulatory Visit

## 2023-06-02 ENCOUNTER — Other Ambulatory Visit

## 2023-06-02 ENCOUNTER — Ambulatory Visit: Admission: RE | Admit: 2023-06-02 | Source: Ambulatory Visit

## 2023-06-02 ENCOUNTER — Ambulatory Visit: Admitting: Oncology

## 2023-06-02 DIAGNOSIS — C109 Malignant neoplasm of oropharynx, unspecified: Secondary | ICD-10-CM

## 2023-06-02 DIAGNOSIS — Z510 Encounter for antineoplastic radiation therapy: Secondary | ICD-10-CM | POA: Diagnosis not present

## 2023-06-02 DIAGNOSIS — C210 Malignant neoplasm of anus, unspecified: Secondary | ICD-10-CM

## 2023-06-02 LAB — CBC (CANCER CENTER ONLY)
HCT: 25.9 % — ABNORMAL LOW (ref 36.0–46.0)
Hemoglobin: 8 g/dL — ABNORMAL LOW (ref 12.0–15.0)
MCH: 29.1 pg (ref 26.0–34.0)
MCHC: 30.9 g/dL (ref 30.0–36.0)
MCV: 94.2 fL (ref 80.0–100.0)
Platelet Count: 67 10*3/uL — ABNORMAL LOW (ref 150–400)
RBC: 2.75 MIL/uL — ABNORMAL LOW (ref 3.87–5.11)
RDW: 17.2 % — ABNORMAL HIGH (ref 11.5–15.5)
WBC Count: 2.3 10*3/uL — ABNORMAL LOW (ref 4.0–10.5)
nRBC: 0 % (ref 0.0–0.2)

## 2023-06-02 LAB — SAMPLE TO BLOOD BANK

## 2023-06-02 MED FILL — Fosaprepitant Dimeglumine For IV Infusion 150 MG (Base Eq): INTRAVENOUS | Qty: 5 | Status: AC

## 2023-06-03 ENCOUNTER — Ambulatory Visit

## 2023-06-03 ENCOUNTER — Other Ambulatory Visit: Payer: Self-pay | Admitting: Oncology

## 2023-06-03 ENCOUNTER — Telehealth: Payer: Self-pay

## 2023-06-03 ENCOUNTER — Inpatient Hospital Stay

## 2023-06-03 ENCOUNTER — Other Ambulatory Visit: Payer: Self-pay | Admitting: *Deleted

## 2023-06-03 ENCOUNTER — Inpatient Hospital Stay: Admitting: Oncology

## 2023-06-03 DIAGNOSIS — C210 Malignant neoplasm of anus, unspecified: Secondary | ICD-10-CM

## 2023-06-03 DIAGNOSIS — C099 Malignant neoplasm of tonsil, unspecified: Secondary | ICD-10-CM

## 2023-06-03 NOTE — Telephone Encounter (Signed)
 Called pt left VM to call back. She can to both supplements together.  KP Copied from CRM [id number]. Topic: Clinical - Medication Question >> Jun 03, 2023 10:48 AM April [name] wrote: Reason for CRM: patient stated she was put on B12 by her dietician and then she was put on Vit-D2 and she wants to know if she can take them together or if she should stop the B12 until she has taken all the Vit-D2. Please f/u with patient

## 2023-06-06 ENCOUNTER — Other Ambulatory Visit: Payer: Self-pay

## 2023-06-06 ENCOUNTER — Other Ambulatory Visit

## 2023-06-06 ENCOUNTER — Inpatient Hospital Stay

## 2023-06-06 ENCOUNTER — Ambulatory Visit: Admitting: Oncology

## 2023-06-06 ENCOUNTER — Ambulatory Visit

## 2023-06-06 ENCOUNTER — Ambulatory Visit
Admission: RE | Admit: 2023-06-06 | Discharge: 2023-06-06 | Disposition: A | Discharge status: Home / Self Care | Source: Ambulatory Visit | Attending: Radiation Oncology | Admitting: Radiation Oncology

## 2023-06-06 DIAGNOSIS — C210 Malignant neoplasm of anus, unspecified: Secondary | ICD-10-CM

## 2023-06-06 DIAGNOSIS — Z510 Encounter for antineoplastic radiation therapy: Secondary | ICD-10-CM | POA: Diagnosis not present

## 2023-06-06 LAB — RAD ONC ARIA SESSION SUMMARY
Course Elapsed Days: 35
Plan Fractions Treated to Date: 18
Plan Prescribed Dose Per Fraction: 2 Gy
Plan Total Fractions Prescribed: 35
Plan Total Prescribed Dose: 70 Gy
Reference Point Dosage Given to Date: 36 Gy
Reference Point Session Dosage Given: 2 Gy
Session Number: 18

## 2023-06-06 LAB — CBC WITH DIFFERENTIAL/PLATELET
Abs Immature Granulocytes: 0.06 10*3/uL (ref 0.00–0.07)
Basophils Absolute: 0 10*3/uL (ref 0.0–0.1)
Basophils Relative: 0 %
Eosinophils Absolute: 0 10*3/uL (ref 0.0–0.5)
Eosinophils Relative: 1 %
HCT: 28 % — ABNORMAL LOW (ref 36.0–46.0)
Hemoglobin: 8.8 g/dL — ABNORMAL LOW (ref 12.0–15.0)
Immature Granulocytes: 2 %
Lymphocytes Relative: 14 %
Lymphs Abs: 0.5 10*3/uL — ABNORMAL LOW (ref 0.7–4.0)
MCH: 29.5 pg (ref 26.0–34.0)
MCHC: 31.4 g/dL (ref 30.0–36.0)
MCV: 94 fL (ref 80.0–100.0)
Monocytes Absolute: 0.4 10*3/uL (ref 0.1–1.0)
Monocytes Relative: 10 %
Neutro Abs: 2.6 10*3/uL (ref 1.7–7.7)
Neutrophils Relative %: 73 %
Platelets: 70 10*3/uL — ABNORMAL LOW (ref 150–400)
RBC: 2.98 MIL/uL — ABNORMAL LOW (ref 3.87–5.11)
RDW: 17.9 % — ABNORMAL HIGH (ref 11.5–15.5)
WBC: 3.5 10*3/uL — ABNORMAL LOW (ref 4.0–10.5)
nRBC: 0 % (ref 0.0–0.2)

## 2023-06-06 LAB — SAMPLE TO BLOOD BANK

## 2023-06-07 ENCOUNTER — Ambulatory Visit

## 2023-06-07 ENCOUNTER — Ambulatory Visit
Admission: RE | Admit: 2023-06-07 | Discharge: 2023-06-07 | Disposition: A | Discharge status: Home / Self Care | Source: Ambulatory Visit | Attending: Radiation Oncology | Admitting: Radiation Oncology

## 2023-06-07 ENCOUNTER — Other Ambulatory Visit: Payer: Self-pay

## 2023-06-07 DIAGNOSIS — Z510 Encounter for antineoplastic radiation therapy: Secondary | ICD-10-CM | POA: Diagnosis not present

## 2023-06-07 LAB — RAD ONC ARIA SESSION SUMMARY
Course Elapsed Days: 36
Plan Fractions Treated to Date: 19
Plan Prescribed Dose Per Fraction: 2 Gy
Plan Total Fractions Prescribed: 35
Plan Total Prescribed Dose: 70 Gy
Reference Point Dosage Given to Date: 38 Gy
Reference Point Session Dosage Given: 2 Gy
Session Number: 19

## 2023-06-08 ENCOUNTER — Ambulatory Visit
Admission: RE | Admit: 2023-06-08 | Discharge: 2023-06-08 | Disposition: A | Discharge status: Home / Self Care | Source: Ambulatory Visit | Attending: Radiation Oncology | Admitting: Radiation Oncology

## 2023-06-08 ENCOUNTER — Ambulatory Visit

## 2023-06-08 ENCOUNTER — Other Ambulatory Visit: Payer: Self-pay

## 2023-06-08 DIAGNOSIS — Z510 Encounter for antineoplastic radiation therapy: Secondary | ICD-10-CM | POA: Diagnosis not present

## 2023-06-08 LAB — RAD ONC ARIA SESSION SUMMARY
Course Elapsed Days: 37
Plan Fractions Treated to Date: 20
Plan Prescribed Dose Per Fraction: 2 Gy
Plan Total Fractions Prescribed: 35
Plan Total Prescribed Dose: 70 Gy
Reference Point Dosage Given to Date: 40 Gy
Reference Point Session Dosage Given: 2 Gy
Session Number: 20

## 2023-06-09 ENCOUNTER — Ambulatory Visit

## 2023-06-09 MED FILL — Fosaprepitant Dimeglumine For IV Infusion 150 MG (Base Eq): INTRAVENOUS | Qty: 5 | Status: AC

## 2023-06-10 ENCOUNTER — Ambulatory Visit

## 2023-06-10 ENCOUNTER — Inpatient Hospital Stay

## 2023-06-10 ENCOUNTER — Inpatient Hospital Stay (HOSPITAL_BASED_OUTPATIENT_CLINIC_OR_DEPARTMENT_OTHER): Attending: Oncology | Admitting: Oncology

## 2023-06-10 ENCOUNTER — Encounter: Payer: Self-pay | Admitting: Oncology

## 2023-06-10 ENCOUNTER — Inpatient Hospital Stay: Attending: Oncology

## 2023-06-10 VITALS — BP 136/50 | HR 66 | Temp 96.4°F | Resp 17

## 2023-06-10 VITALS — BP 139/48 | HR 67 | Temp 97.3°F | Resp 17 | Wt 127.6 lb

## 2023-06-10 DIAGNOSIS — R531 Weakness: Secondary | ICD-10-CM | POA: Diagnosis not present

## 2023-06-10 DIAGNOSIS — C099 Malignant neoplasm of tonsil, unspecified: Secondary | ICD-10-CM | POA: Insufficient documentation

## 2023-06-10 DIAGNOSIS — Z510 Encounter for antineoplastic radiation therapy: Secondary | ICD-10-CM | POA: Diagnosis not present

## 2023-06-10 DIAGNOSIS — R5383 Other fatigue: Secondary | ICD-10-CM | POA: Diagnosis not present

## 2023-06-10 DIAGNOSIS — R1310 Dysphagia, unspecified: Secondary | ICD-10-CM | POA: Insufficient documentation

## 2023-06-10 DIAGNOSIS — E119 Type 2 diabetes mellitus without complications: Secondary | ICD-10-CM | POA: Insufficient documentation

## 2023-06-10 DIAGNOSIS — Z7982 Long term (current) use of aspirin: Secondary | ICD-10-CM | POA: Diagnosis not present

## 2023-06-10 DIAGNOSIS — Z7952 Long term (current) use of systemic steroids: Secondary | ICD-10-CM | POA: Insufficient documentation

## 2023-06-10 DIAGNOSIS — R601 Generalized edema: Secondary | ICD-10-CM | POA: Diagnosis not present

## 2023-06-10 DIAGNOSIS — N289 Disorder of kidney and ureter, unspecified: Secondary | ICD-10-CM | POA: Diagnosis not present

## 2023-06-10 DIAGNOSIS — E041 Nontoxic single thyroid nodule: Secondary | ICD-10-CM | POA: Diagnosis not present

## 2023-06-10 DIAGNOSIS — Z5111 Encounter for antineoplastic chemotherapy: Secondary | ICD-10-CM

## 2023-06-10 DIAGNOSIS — D72819 Decreased white blood cell count, unspecified: Secondary | ICD-10-CM | POA: Diagnosis not present

## 2023-06-10 DIAGNOSIS — Z87891 Personal history of nicotine dependence: Secondary | ICD-10-CM | POA: Diagnosis not present

## 2023-06-10 DIAGNOSIS — E785 Hyperlipidemia, unspecified: Secondary | ICD-10-CM | POA: Diagnosis not present

## 2023-06-10 DIAGNOSIS — D696 Thrombocytopenia, unspecified: Secondary | ICD-10-CM | POA: Insufficient documentation

## 2023-06-10 DIAGNOSIS — C210 Malignant neoplasm of anus, unspecified: Secondary | ICD-10-CM | POA: Diagnosis not present

## 2023-06-10 DIAGNOSIS — I10 Essential (primary) hypertension: Secondary | ICD-10-CM | POA: Diagnosis not present

## 2023-06-10 DIAGNOSIS — Z7984 Long term (current) use of oral hypoglycemic drugs: Secondary | ICD-10-CM | POA: Diagnosis not present

## 2023-06-10 DIAGNOSIS — D649 Anemia, unspecified: Secondary | ICD-10-CM | POA: Diagnosis present

## 2023-06-10 DIAGNOSIS — Z95828 Presence of other vascular implants and grafts: Secondary | ICD-10-CM | POA: Insufficient documentation

## 2023-06-10 DIAGNOSIS — C109 Malignant neoplasm of oropharynx, unspecified: Secondary | ICD-10-CM | POA: Diagnosis not present

## 2023-06-10 DIAGNOSIS — Z79899 Other long term (current) drug therapy: Secondary | ICD-10-CM | POA: Diagnosis not present

## 2023-06-10 LAB — CBC WITH DIFFERENTIAL (CANCER CENTER ONLY)
Abs Immature Granulocytes: 0.04 10*3/uL (ref 0.00–0.07)
Basophils Absolute: 0 10*3/uL (ref 0.0–0.1)
Basophils Relative: 0 %
Eosinophils Absolute: 0 10*3/uL (ref 0.0–0.5)
Eosinophils Relative: 1 %
HCT: 23.6 % — ABNORMAL LOW (ref 36.0–46.0)
Hemoglobin: 7.3 g/dL — ABNORMAL LOW (ref 12.0–15.0)
Immature Granulocytes: 1 %
Lymphocytes Relative: 11 %
Lymphs Abs: 0.3 10*3/uL — ABNORMAL LOW (ref 0.7–4.0)
MCH: 29.4 pg (ref 26.0–34.0)
MCHC: 30.9 g/dL (ref 30.0–36.0)
MCV: 95.2 fL (ref 80.0–100.0)
Monocytes Absolute: 0.3 10*3/uL (ref 0.1–1.0)
Monocytes Relative: 10 %
Neutro Abs: 2.2 10*3/uL (ref 1.7–7.7)
Neutrophils Relative %: 77 %
Platelet Count: 57 10*3/uL — ABNORMAL LOW (ref 150–400)
RBC: 2.48 MIL/uL — ABNORMAL LOW (ref 3.87–5.11)
RDW: 17.7 % — ABNORMAL HIGH (ref 11.5–15.5)
WBC Count: 2.9 10*3/uL — ABNORMAL LOW (ref 4.0–10.5)
nRBC: 0 % (ref 0.0–0.2)

## 2023-06-10 LAB — MAGNESIUM: Magnesium: 1.3 mg/dL — ABNORMAL LOW (ref 1.7–2.4)

## 2023-06-10 LAB — BASIC METABOLIC PANEL - CANCER CENTER ONLY
Anion gap: 9 (ref 5–15)
BUN: 24 mg/dL — ABNORMAL HIGH (ref 8–23)
CO2: 26 mmol/L (ref 22–32)
Calcium: 8.1 mg/dL — ABNORMAL LOW (ref 8.9–10.3)
Chloride: 105 mmol/L (ref 98–111)
Creatinine: 1.07 mg/dL — ABNORMAL HIGH (ref 0.44–1.00)
GFR, Estimated: 51 mL/min — ABNORMAL LOW (ref >60)
Glucose, Bld: 208 mg/dL — ABNORMAL HIGH (ref 70–99)
Potassium: 3.6 mmol/L (ref 3.5–5.1)
Sodium: 140 mmol/L (ref 135–145)

## 2023-06-10 LAB — PREPARE RBC (CROSSMATCH)

## 2023-06-10 MED ORDER — SODIUM CHLORIDE 0.9% FLUSH
3.0000 mL | INTRAVENOUS | Status: DC | PRN
Start: 2023-06-10 — End: 2023-06-10
  Filled 2023-06-10: qty 3

## 2023-06-10 MED ORDER — ACETAMINOPHEN 325 MG PO TABS
650.0000 mg | ORAL_TABLET | Freq: Once | ORAL | Status: AC
  Administered 2023-06-10: 650 mg via ORAL
  Filled 2023-06-10: qty 2

## 2023-06-10 MED ORDER — DIPHENHYDRAMINE HCL 50 MG/ML IJ SOLN
25.0000 mg | Freq: Once | INTRAMUSCULAR | Status: AC
  Administered 2023-06-10: 25 mg via INTRAVENOUS
  Filled 2023-06-10: qty 1

## 2023-06-10 MED ORDER — HEPARIN SOD (PORK) LOCK FLUSH 100 UNIT/ML IV SOLN
500.0000 [IU] | Freq: Every day | INTRAVENOUS | Status: AC | PRN
  Administered 2023-06-10: 500 [IU]
  Filled 2023-06-10: qty 5

## 2023-06-10 MED ORDER — SODIUM CHLORIDE 0.9% IV SOLUTION
250.0000 mL | INTRAVENOUS | Status: DC
  Administered 2023-06-10: 250 mL via INTRAVENOUS
  Filled 2023-06-10: qty 250

## 2023-06-10 MED ORDER — MAGNESIUM SULFATE 4 GM/100ML IV SOLN
4.0000 g | Freq: Once | INTRAVENOUS | Status: AC
  Administered 2023-06-10: 4 g via INTRAVENOUS

## 2023-06-10 NOTE — Progress Notes (Signed)
 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  INTERVAL HISTORY: Patient returns to clinic today for further evaluation and reconsideration of cycle 3 of weekly cisplatin .  She can feels well and is asymptomatic.  She is to have chronic weakness and fatigue.  Her appetite has improved and she denies any dysphagia today. She has no neurologic complaints.  She denies any recent fevers or illnesses. She has no chest pain, shortness of breath, cough, or hemoptysis.  She denies any nausea, vomiting, constipation, or diarrhea.  She has no melena or hematochezia.  She has no urinary complaints.  Patient offers no specific complaints today.  REVIEW OF SYSTEMS:   Review of Systems  Constitutional:  Positive for malaise/fatigue. Negative for fever and weight loss.  HENT:  Negative for sore throat.   Respiratory: Negative.  Negative for cough, hemoptysis and shortness of breath.   Cardiovascular: Negative.  Negative for chest pain and leg swelling.  Gastrointestinal: Negative.  Negative for abdominal pain, blood in stool, diarrhea and melena.  Genitourinary: Negative.  Negative for dysuria.  Musculoskeletal: Negative.  Negative for back pain.  Skin: Negative.  Negative for rash.  Neurological:  Positive for weakness. Negative for dizziness, focal weakness and headaches.  Psychiatric/Behavioral: Negative.  The patient is not nervous/anxious.    As per HPI. Otherwise, a  complete review of systems is negative.  PAST MEDICAL HISTORY: Past Medical History:  Diagnosis Date   Anemia    Diabetes mellitus without complication (HCC)    Gout    Hyperlipidemia    Hypertension    Wears dentures    full upper and lower    PAST SURGICAL HISTORY: Past Surgical History:  Procedure Laterality Date   APPENDECTOMY     gallstones  06/12/2009   MICROLARYNGOSCOPY Right 01/28/2023   Procedure: MICRODIRECT LARYNGOSCOPY WITH BIOPSY OF OROPHARYNGEAL MASS;  Surgeon: Lesly Raspberry, MD;  Location: [facility];  Service: ENT;  Laterality: Right;  Diabetic   PORTACATH PLACEMENT N/A 02/18/2023   Procedure: INSERTION PORT-A-CATH;  Surgeon: Flynn Hylan, MD;  Location: [facility];  Service: General;  Laterality: N/A;   TONSILLECTOMY      FAMILY HISTORY: Family History  Problem Relation Age of Onset   Breast cancer Neg Hx     ADVANCED DIRECTIVES (Y/N):  N  HEALTH MAINTENANCE: Social History   Tobacco Use   Smoking status: Former    Current packs/day: 0.00    Average packs/day: 0.5 packs/day for 8.0 years (4.0 ttl pk-yrs)    Types: Cigarettes    Start date: 47    Quit date: 1990    Years since quitting: 35.3    Passive exposure: Past   Smokeless tobacco: Never  Vaping Use   Vaping status: Never Used  Substance Use Topics   Alcohol use: No    Alcohol/week: 0.0 standard drinks of alcohol   Drug use: No     Colonoscopy:  PAP:  Bone density:  Lipid panel:  No Known Allergies  Current Outpatient Medications  Medication Sig Dispense Refill  Accu-Chek Softclix Lancets lancets      amLODipine (NORVASC) 10 MG tablet Take 10 mg by mouth daily.     aspirin 81 MG tablet Take 1 tablet by mouth daily.     capecitabine  (XELODA ) 500 MG tablet Take 2 tablets (1,000 mg total) by mouth 2 (two) times daily after a meal. Take Monday-Friday. Take only on days of radiation. 120 tablet 0   carvedilol  (COREG ) 3.125 MG tablet Take 1 tablet by mouth 2 (two)  times daily. cardio     diphenoxylate -atropine  (LOMOTIL ) 2.5-0.025 MG tablet Take 1 tablet by mouth 4 (four) times daily as needed for diarrhea or loose stools. 30 tablet 0   glucose blood (ACCU-CHEK AVIVA PLUS) test strip USE TWICE DAILY AS DIRECTED     lisinopril  (ZESTRIL ) 20 MG tablet Take 1 tablet (20 mg total) by mouth daily. 30 tablet 0   magic mouthwash (multi-ingredient) oral suspension Swish and swallow 5-10 mLs 4 (four) times daily as needed. 480 mL 3   metFORMIN  (GLUCOPHAGE ) 500 MG tablet One every morning 90 tablet 1   Morphine  Sulfate (MORPHINE  CONCENTRATE) 10 mg / 0.5 ml concentrated solution Take 1 mL (20 mg total) by mouth every 2 (two) hours as needed for severe pain (pain score 7-10). 15 mL 0   oxyCODONE  (ROXICODONE ) 5 MG/5ML solution Take 5 mLs (5 mg total) by mouth every 4 (four) hours as needed for severe pain (pain score 7-10). 100 mL 0   sucralfate  (CARAFATE ) 1 g tablet Take 1 tablet (1 g total) by mouth 4 (four) times daily -  with meals and at bedtime. Dissolve tablet in 4 tablespoons of warm water  then swish and swallow 30 minutes before meals. 120 tablet 0   tiZANidine  (ZANAFLEX ) 4 MG tablet Take 1 tablet (4 mg total) by mouth at bedtime. 90 tablet 1   No current facility-administered medications for this visit.   Facility-Administered Medications Ordered in Other Visits  Medication Dose Route Frequency Provider Last Rate Last Admin   0.9 %  sodium chloride  infusion   Intravenous Continuous Shellie Dials, MD   Stopped at 05/13/23 1535   morphine  (PF) 2 MG/ML injection             OBJECTIVE: Vitals:   06/10/23 0843  BP: (!) 139/48  Pulse: 67  Resp: 17  Temp: (!) 97.3 F (36.3 C)  SpO2: 100%       Body mass index is 20.6 kg/m.    ECOG FS:1 - Symptomatic but completely ambulatory  General: Well-developed, well-nourished, no acute distress. Eyes: Pink conjunctiva, anicteric sclera. HEENT: Normocephalic, moist mucous membranes. Lungs: No audible  wheezing or coughing. Heart: Regular rate and rhythm. Abdomen: Soft, nontender, no obvious distention. Musculoskeletal: No edema, cyanosis, or clubbing. Neuro: Alert, answering all questions appropriately. Cranial nerves grossly intact. Skin: No rashes or petechiae noted. Psych: Normal affect.  LAB RESULTS:  Lab Results  Component Value Date   NA 140 06/10/2023   K 3.6 06/10/2023   CL 105 06/10/2023   CO2 26 06/10/2023   GLUCOSE 208 (H) 06/10/2023   BUN 24 (H) 06/10/2023   CREATININE 1.07 (H) 06/10/2023   CALCIUM 8.1 (L) 06/10/2023   PROT 6.8 04/12/2023   ALBUMIN 3.5 (L) 05/23/2023   AST 22 04/12/2023   ALT 8 04/12/2023   ALKPHOS 39 04/12/2023   BILITOT 0.9 04/12/2023   GFRNONAA 51 (L) 06/10/2023   GFRAA 59 (L) 09/24/2019    Lab Results  Component Value Date   WBC  2.9 (L) 06/10/2023   NEUTROABS 2.2 06/10/2023   HGB 7.3 (L) 06/10/2023   HCT 23.6 (L) 06/10/2023   MCV 95.2 06/10/2023   PLT 57 (L) 06/10/2023     STUDIES: No results found.  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  Patient has now completed concurrent XRT along with chemotherapy.  Her last dose of mitomycin  was on March 07, 2023.  She finished Xeloda  and XRT on March 25, 2023.  Repeat PET scan results from April 07, 2023 reviewed independently and reported as above with persistent hypermetabolism at the site of her anal cancer, but PET scan was done sooner than required given her head and neck cancer and this may be residual inflammation/irritation from XRT.  Appreciate surgical input. 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  Initial plan was to give  concurrent XRT along with weekly cisplatin .  But given patient's chronic thrombocytopenia, she has only received 2 doses of cisplatin  and none since May 13, 2023.  1 again delay treatment today.  Patient instead will receive 1 unit of blood.  Return to clinic in 1 week for further evaluation and consideration of treatment.   Thyroid  nodule with hypermetabolism: Defer ultrasound and biopsy until completion of treatment of 2 malignancies above. Anemia: Hemoglobin has trended down to 7.3.  1 unit of blood as above.  Leukopenia: Chronic and unchanged.  Patient's total white blood cell count is 2.9. Thrombocytopenia: Chronic and unchanged.  Patient's platelet count is 57.  Delay treatment as above. Hypomagnesia: Magnesium  1.3 today.  Patient will require IV magnesium  in the future.  Hypokalemia: Resolved.  Continue IV potassium supplementation along with treatments as well.  Renal insufficiency: GFR improved to 51.  Continue to hold treatment as above secondary to thrombocytopenia.   Dysphagia: Patient not complain of this today.  Continue oxycodone  and Magic mouthwash as needed.    Patient expressed understanding and was in agreement with this plan. She also understands that She can call clinic at any time with any questions, concerns, or complaints.    Cancer Staging  Anal squamous cell carcinoma (HCC) Staging form: Anus, AJCC V9 - Clinical stage from 01/18/2023: Stage IIIA (cT3, cN0, cM0) - Signed by Shellie Dials, MD on 01/18/2023 Stage prefix: Initial diagnosis  Right tonsillar squamous cell carcinoma (HCC) Staging form: Pharynx - P16 Negative Oropharynx, AJCC 8th Edition - Clinical stage from 04/12/2023: Stage III (cT3, cN1, cM0, p16: Unknown) - Signed by Shellie Dials, MD on 04/12/2023 Stage prefix: Initial diagnosis   Shellie Dials, MD   06/10/2023 8:57 AM

## 2023-06-10 NOTE — Progress Notes (Signed)
 Patient here for oncology follow-up appointment, expresses no complaints or concerns at this time.

## 2023-06-10 NOTE — Progress Notes (Signed)
 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  Patient has completed treatment for anus cancer.  Now receiving cisplatin  and radiation for head and neck cancer.    Met with patient during infusion.  Reports that her appetite is good.  "I am eating all the time."  This morning ate cheese, slice bread, muffin and coffee.  Last night for dinner ate salad with ham and muffin.  Lunch yesterday was ham sandwich.  .  Has not drank any shakes recently.  Reports no nausea or diarrhea.  Says that she can still taste foods.  Denies trouble swallowing    Medications: reviewed  Labs: reviewed  Anthropometrics:   Weight 127 lb 9.6 oz today  114 lb on 4/18 (? Accuracy) 122 lb on 4/14 121 lb on 4/11 121 lb on 4/8   NUTRITION DIAGNOSIS: Unintentional weight loss stable  Severe malnutrition improving  INTERVENTION:  Continue high calorie, high protein foods to help maintain weight during treatment Encouraged oral nutrition supplement if possible even if eating better on solid foods.     MONITORING, EVALUATION, GOAL: weight trends, intake   NEXT VISIT: Thursday, May 8 during infusion  Mitzi Lilja B. Zollie Hipp, CSO, LDN Registered Dietitian [phone]

## 2023-06-11 LAB — TYPE AND SCREEN
ABO/RH(D): A POS
Antibody Screen: POSITIVE
Donor AG Type: NEGATIVE
Unit division: 0

## 2023-06-11 LAB — BPAM RBC
Blood Product Expiration Date: 202505172359
ISSUE DATE / TIME: 202505021229
Unit Type and Rh: 6200

## 2023-06-13 ENCOUNTER — Other Ambulatory Visit: Payer: Self-pay

## 2023-06-13 ENCOUNTER — Ambulatory Visit

## 2023-06-13 ENCOUNTER — Other Ambulatory Visit: Payer: Self-pay | Admitting: Family Medicine

## 2023-06-13 ENCOUNTER — Ambulatory Visit
Admission: RE | Admit: 2023-06-13 | Discharge: 2023-06-13 | Disposition: A | Discharge status: Home / Self Care | Source: Ambulatory Visit | Attending: Radiation Oncology | Admitting: Radiation Oncology

## 2023-06-13 ENCOUNTER — Other Ambulatory Visit: Payer: Self-pay | Admitting: *Deleted

## 2023-06-13 DIAGNOSIS — R1310 Dysphagia, unspecified: Secondary | ICD-10-CM | POA: Insufficient documentation

## 2023-06-13 DIAGNOSIS — D649 Anemia, unspecified: Secondary | ICD-10-CM | POA: Insufficient documentation

## 2023-06-13 DIAGNOSIS — Z7982 Long term (current) use of aspirin: Secondary | ICD-10-CM | POA: Insufficient documentation

## 2023-06-13 DIAGNOSIS — E119 Type 2 diabetes mellitus without complications: Secondary | ICD-10-CM | POA: Insufficient documentation

## 2023-06-13 DIAGNOSIS — Z7952 Long term (current) use of systemic steroids: Secondary | ICD-10-CM | POA: Insufficient documentation

## 2023-06-13 DIAGNOSIS — R601 Generalized edema: Secondary | ICD-10-CM | POA: Insufficient documentation

## 2023-06-13 DIAGNOSIS — Z510 Encounter for antineoplastic radiation therapy: Secondary | ICD-10-CM | POA: Insufficient documentation

## 2023-06-13 DIAGNOSIS — E785 Hyperlipidemia, unspecified: Secondary | ICD-10-CM | POA: Insufficient documentation

## 2023-06-13 DIAGNOSIS — Z95828 Presence of other vascular implants and grafts: Secondary | ICD-10-CM | POA: Insufficient documentation

## 2023-06-13 DIAGNOSIS — Z87891 Personal history of nicotine dependence: Secondary | ICD-10-CM | POA: Insufficient documentation

## 2023-06-13 DIAGNOSIS — C109 Malignant neoplasm of oropharynx, unspecified: Secondary | ICD-10-CM | POA: Insufficient documentation

## 2023-06-13 DIAGNOSIS — C210 Malignant neoplasm of anus, unspecified: Secondary | ICD-10-CM | POA: Insufficient documentation

## 2023-06-13 DIAGNOSIS — I10 Essential (primary) hypertension: Secondary | ICD-10-CM

## 2023-06-13 DIAGNOSIS — Z79899 Other long term (current) drug therapy: Secondary | ICD-10-CM | POA: Insufficient documentation

## 2023-06-13 DIAGNOSIS — C099 Malignant neoplasm of tonsil, unspecified: Secondary | ICD-10-CM | POA: Insufficient documentation

## 2023-06-13 DIAGNOSIS — Z7984 Long term (current) use of oral hypoglycemic drugs: Secondary | ICD-10-CM | POA: Insufficient documentation

## 2023-06-13 DIAGNOSIS — N289 Disorder of kidney and ureter, unspecified: Secondary | ICD-10-CM | POA: Insufficient documentation

## 2023-06-13 DIAGNOSIS — D696 Thrombocytopenia, unspecified: Secondary | ICD-10-CM | POA: Insufficient documentation

## 2023-06-13 DIAGNOSIS — E041 Nontoxic single thyroid nodule: Secondary | ICD-10-CM | POA: Insufficient documentation

## 2023-06-13 DIAGNOSIS — R531 Weakness: Secondary | ICD-10-CM | POA: Insufficient documentation

## 2023-06-13 DIAGNOSIS — D72819 Decreased white blood cell count, unspecified: Secondary | ICD-10-CM | POA: Insufficient documentation

## 2023-06-13 DIAGNOSIS — R5383 Other fatigue: Secondary | ICD-10-CM | POA: Insufficient documentation

## 2023-06-13 LAB — RAD ONC ARIA SESSION SUMMARY
Course Elapsed Days: 42
Plan Fractions Treated to Date: 21
Plan Prescribed Dose Per Fraction: 2 Gy
Plan Total Fractions Prescribed: 35
Plan Total Prescribed Dose: 70 Gy
Reference Point Dosage Given to Date: 42 Gy
Reference Point Session Dosage Given: 2 Gy
Session Number: 21

## 2023-06-13 MED ORDER — FLUCONAZOLE 100 MG PO TABS: 100.0000 mg | ORAL_TABLET | Freq: Every day | ORAL | 0 refills | Status: DC

## 2023-06-13 MED ORDER — DEXAMETHASONE 2 MG PO TABS: 2.0000 mg | ORAL_TABLET | Freq: Two times a day (BID) | ORAL | 0 refills | Status: DC

## 2023-06-14 ENCOUNTER — Ambulatory Visit

## 2023-06-14 ENCOUNTER — Ambulatory Visit: Admission: RE | Admit: 2023-06-14 | Source: Ambulatory Visit

## 2023-06-15 ENCOUNTER — Inpatient Hospital Stay

## 2023-06-15 ENCOUNTER — Encounter: Payer: Self-pay | Admitting: Oncology

## 2023-06-15 ENCOUNTER — Ambulatory Visit

## 2023-06-15 ENCOUNTER — Inpatient Hospital Stay (HOSPITAL_BASED_OUTPATIENT_CLINIC_OR_DEPARTMENT_OTHER): Admitting: Oncology

## 2023-06-15 VITALS — BP 131/54 | HR 65 | Temp 96.7°F | Resp 14 | Wt 121.0 lb

## 2023-06-15 DIAGNOSIS — C099 Malignant neoplasm of tonsil, unspecified: Secondary | ICD-10-CM

## 2023-06-15 DIAGNOSIS — Z510 Encounter for antineoplastic radiation therapy: Secondary | ICD-10-CM | POA: Diagnosis not present

## 2023-06-15 DIAGNOSIS — C210 Malignant neoplasm of anus, unspecified: Secondary | ICD-10-CM

## 2023-06-15 DIAGNOSIS — Z95828 Presence of other vascular implants and grafts: Secondary | ICD-10-CM

## 2023-06-15 DIAGNOSIS — C109 Malignant neoplasm of oropharynx, unspecified: Secondary | ICD-10-CM

## 2023-06-15 LAB — CBC WITH DIFFERENTIAL (CANCER CENTER ONLY)
Abs Immature Granulocytes: 0.02 10*3/uL (ref 0.00–0.07)
Basophils Absolute: 0 10*3/uL (ref 0.0–0.1)
Basophils Relative: 0 %
Eosinophils Absolute: 0 10*3/uL (ref 0.0–0.5)
Eosinophils Relative: 0 %
HCT: 28 % — ABNORMAL LOW (ref 36.0–46.0)
Hemoglobin: 9.1 g/dL — ABNORMAL LOW (ref 12.0–15.0)
Immature Granulocytes: 1 %
Lymphocytes Relative: 11 %
Lymphs Abs: 0.4 10*3/uL — ABNORMAL LOW (ref 0.7–4.0)
MCH: 29.7 pg (ref 26.0–34.0)
MCHC: 32.5 g/dL (ref 30.0–36.0)
MCV: 91.5 fL (ref 80.0–100.0)
Monocytes Absolute: 0.2 10*3/uL (ref 0.1–1.0)
Monocytes Relative: 6 %
Neutro Abs: 2.7 10*3/uL (ref 1.7–7.7)
Neutrophils Relative %: 82 %
Platelet Count: 66 10*3/uL — ABNORMAL LOW (ref 150–400)
RBC: 3.06 MIL/uL — ABNORMAL LOW (ref 3.87–5.11)
RDW: 16.7 % — ABNORMAL HIGH (ref 11.5–15.5)
WBC Count: 3.3 10*3/uL — ABNORMAL LOW (ref 4.0–10.5)
nRBC: 0 % (ref 0.0–0.2)

## 2023-06-15 LAB — BASIC METABOLIC PANEL - CANCER CENTER ONLY
Anion gap: 9 (ref 5–15)
BUN: 22 mg/dL (ref 8–23)
CO2: 25 mmol/L (ref 22–32)
Calcium: 8.3 mg/dL — ABNORMAL LOW (ref 8.9–10.3)
Chloride: 102 mmol/L (ref 98–111)
Creatinine: 1.24 mg/dL — ABNORMAL HIGH (ref 0.44–1.00)
GFR, Estimated: 43 mL/min — ABNORMAL LOW (ref >60)
Glucose, Bld: 248 mg/dL — ABNORMAL HIGH (ref 70–99)
Potassium: 4 mmol/L (ref 3.5–5.1)
Sodium: 136 mmol/L (ref 135–145)

## 2023-06-15 LAB — MAGNESIUM: Magnesium: 1.5 mg/dL — ABNORMAL LOW (ref 1.7–2.4)

## 2023-06-15 LAB — SAMPLE TO BLOOD BANK

## 2023-06-15 MED ORDER — HEPARIN SOD (PORK) LOCK FLUSH 100 UNIT/ML IV SOLN
500.0000 [IU] | Freq: Once | INTRAVENOUS | Status: DC | PRN
  Filled 2023-06-15: qty 5

## 2023-06-15 MED ORDER — SODIUM CHLORIDE 0.9 % IV SOLN
Freq: Once | INTRAVENOUS | Status: AC
  Filled 2023-06-15: qty 250

## 2023-06-15 MED ORDER — MAGNESIUM SULFATE 4 GM/100ML IV SOLN
4.0000 g | Freq: Once | INTRAVENOUS | Status: AC
  Administered 2023-06-15: 4 g via INTRAVENOUS
  Filled 2023-06-15: qty 100

## 2023-06-15 MED ORDER — HEPARIN SOD (PORK) LOCK FLUSH 100 UNIT/ML IV SOLN
500.0000 [IU] | Freq: Once | INTRAVENOUS | Status: AC
  Administered 2023-06-15 (×2): 500 [IU] via INTRAVENOUS
  Filled 2023-06-15: qty 5

## 2023-06-15 MED ORDER — SODIUM CHLORIDE 0.9% FLUSH
10.0000 mL | Freq: Once | INTRAVENOUS | Status: DC | PRN
  Filled 2023-06-15: qty 10

## 2023-06-15 NOTE — Patient Instructions (Signed)
 Magnesium Sulfate Injection What is this medication? MAGNESIUM SULFATE (mag NEE zee um SUL fate) prevents and treats low levels of magnesium in your body. It may also be used to prevent and treat seizures during pregnancy in people with high blood pressure disorders, such as preeclampsia or eclampsia. Magnesium plays an important role in maintaining the health of your muscles and nervous system. This medicine may be used for other purposes; ask your health care provider or pharmacist if you have questions. What should I tell my care team before I take this medication? They need to know if you have any of these conditions: Heart disease History of irregular heart beat Kidney disease An unusual or allergic reaction to magnesium sulfate, medications, foods, dyes, or preservatives Pregnant or trying to get pregnant Breast-feeding How should I use this medication? This medication is for infusion into a vein. It is given in a hospital or clinic setting. Talk to your care team about the use of this medication in children. While this medication may be prescribed for selected conditions, precautions do apply. Overdosage: If you think you have taken too much of this medicine contact a poison control center or emergency room at once. NOTE: This medicine is only for you. Do not share this medicine with others. What if I miss a dose? This does not apply. What may interact with this medication? Certain medications for anxiety or sleep Certain medications for seizures, such phenobarbital Digoxin Medications that relax muscles for surgery Narcotic medications for pain This list may not describe all possible interactions. Give your health care provider a list of all the medicines, herbs, non-prescription drugs, or dietary supplements you use. Also tell them if you smoke, drink alcohol, or use illegal drugs. Some items may interact with your medicine. What should I watch for while using this  medication? Your condition will be monitored carefully while you are receiving this medication. You may need blood work done while you are receiving this medication. What side effects may I notice from receiving this medication? Side effects that you should report to your care team as soon as possible: Allergic reactions--skin rash, itching, hives, swelling of the face, lips, tongue, or throat High magnesium level--confusion, drowsiness, facial flushing, redness, sweating, muscle weakness, fast or irregular heartbeat, trouble breathing Low blood pressure--dizziness, feeling faint or lightheaded, blurry vision Side effects that usually do not require medical attention (report to your care team if they continue or are bothersome): Headache Nausea This list may not describe all possible side effects. Call your doctor for medical advice about side effects. You may report side effects to FDA at 1-800-FDA-1088. Where should I keep my medication? This medication is given in a hospital or clinic and will not be stored at home. NOTE: This sheet is a summary. It may not cover all possible information. If you have questions about this medicine, talk to your doctor, pharmacist, or health care provider.  2024 Elsevier/Gold Standard (2020-10-08 00:00:00)

## 2023-06-15 NOTE — Progress Notes (Signed)
 Patient's radiation treatment is on hold due to her mouth being swollen. She don't have any questions for the doctor today.

## 2023-06-15 NOTE — Progress Notes (Signed)
 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  INTERVAL HISTORY: Patient returns to clinic today for further evaluation and reconsideration of cycle 3 of weekly cisplatin .  XRT is on hold this week secondary to increased dysphagia.  Patient otherwise feels well.  She continues to have chronic weakness and fatigue. She has no neurologic complaints.  She denies any recent fevers or illnesses. She has no chest pain, shortness of breath, cough, or hemoptysis.  She denies any nausea, vomiting, constipation, or diarrhea.  She has no melena or hematochezia.  She has no urinary complaints.  Patient offers no further specific complaints today.  REVIEW OF SYSTEMS:   Review of Systems  Constitutional:  Positive for malaise/fatigue. Negative for fever and weight loss.  HENT:  Positive for sore throat.   Respiratory: Negative.  Negative for cough, hemoptysis and shortness of breath.   Cardiovascular: Negative.  Negative for chest pain and leg swelling.  Gastrointestinal: Negative.  Negative for abdominal pain, blood in stool, diarrhea and melena.  Genitourinary: Negative.  Negative for dysuria.  Musculoskeletal: Negative.  Negative for back pain.  Skin: Negative.  Negative for rash.  Neurological:  Positive for weakness. Negative for dizziness, focal weakness and headaches.  Psychiatric/Behavioral: Negative.  The patient is not nervous/anxious.    As per HPI. Otherwise, a  complete review of systems is negative.  PAST MEDICAL HISTORY: Past Medical History:  Diagnosis Date   Anemia    Diabetes mellitus without complication (HCC)    Gout    Hyperlipidemia    Hypertension    Wears dentures    full upper and lower    PAST SURGICAL HISTORY: Past Surgical History:  Procedure Laterality Date   APPENDECTOMY     gallstones  06/12/2009   MICROLARYNGOSCOPY Right 01/28/2023   Procedure: MICRODIRECT LARYNGOSCOPY WITH BIOPSY OF OROPHARYNGEAL MASS;  Surgeon: Lesly Raspberry, MD;  Location: [facility];  Service: ENT;  Laterality: Right;  Diabetic   PORTACATH PLACEMENT N/A 02/18/2023   Procedure: INSERTION PORT-A-CATH;  Surgeon: Flynn Hylan, MD;  Location: [facility];  Service: General;  Laterality: N/A;   TONSILLECTOMY      FAMILY HISTORY: Family History  Problem Relation Age of Onset   Breast cancer Neg Hx     ADVANCED DIRECTIVES (Y/N):  N  HEALTH MAINTENANCE: Social History   Tobacco Use   Smoking status: Former    Current packs/day: 0.00    Average packs/day: 0.5 packs/day for 8.0 years (4.0 ttl pk-yrs)    Types: Cigarettes    Start date: 23    Quit date: 1990    Years since quitting: 35.3    Passive exposure: Past   Smokeless tobacco: Never  Vaping Use   Vaping status: Never Used  Substance Use Topics   Alcohol use: No    Alcohol/week: 0.0 standard drinks of alcohol   Drug use: No     Colonoscopy:  PAP:  Bone density:  Lipid panel:  No Known Allergies  Current Outpatient Medications  Medication Sig Dispense Refill  Accu-Chek Softclix Lancets lancets      amLODipine (NORVASC) 10 MG tablet Take 10 mg by mouth daily.     aspirin 81 MG tablet Take 1 tablet by mouth daily.     capecitabine  (XELODA ) 500 MG tablet Take 2 tablets (1,000 mg total) by mouth 2 (two) times daily after a meal. Take Monday-Friday. Take only on days of radiation. 120 tablet 0   carvedilol  (COREG ) 3.125 MG tablet Take 1 tablet by mouth 2 (two)  times daily. cardio     dexamethasone  (DECADRON ) 2 MG tablet Take 1 tablet (2 mg total) by mouth 2 (two) times daily with a meal. 45 tablet 0   diphenoxylate -atropine  (LOMOTIL ) 2.5-0.025 MG tablet Take 1 tablet by mouth 4 (four) times daily as needed for diarrhea or loose stools. 30 tablet 0   fluconazole (DIFLUCAN) 100 MG tablet Take 1 tablet (100 mg total) by mouth daily. 7 tablet 0   glucose blood (ACCU-CHEK AVIVA PLUS) test strip USE TWICE DAILY AS DIRECTED     lisinopril  (ZESTRIL ) 20 MG tablet TAKE 1 TABLET(20 MG) BY MOUTH DAILY 30 tablet 0   magic mouthwash (multi-ingredient) oral suspension Swish and swallow 5-10 mLs 4 (four) times daily as needed. 480 mL 3   metFORMIN  (GLUCOPHAGE ) 500 MG tablet One every morning 90 tablet 1   Morphine  Sulfate (MORPHINE  CONCENTRATE) 10 mg / 0.5 ml concentrated solution Take 1 mL (20 mg total) by mouth every 2 (two) hours as needed for severe pain (pain score 7-10). 15 mL 0   oxyCODONE  (ROXICODONE ) 5 MG/5ML solution Take 5 mLs (5 mg total) by mouth every 4 (four) hours as needed for severe pain (pain score 7-10). 100 mL 0   sucralfate  (CARAFATE ) 1 g tablet Take 1 tablet (1 g total) by mouth 4 (four) times daily -  with meals and at bedtime. Dissolve tablet in 4 tablespoons of warm water  then swish and swallow 30 minutes before meals. 120 tablet 0   tiZANidine  (ZANAFLEX ) 4 MG tablet Take 1 tablet (4 mg total) by mouth at bedtime. 90 tablet 1   No current facility-administered medications for this visit.   Facility-Administered Medications Ordered in Other Visits  Medication Dose Route Frequency Provider Last Rate Last Admin   0.9 %  sodium chloride  infusion   Intravenous Continuous Shellie Dials, MD   Stopped at 05/13/23 1535   morphine  (PF) 2 MG/ML injection             OBJECTIVE: Vitals:   06/15/23 0836  BP: (!) 131/54  Pulse: 65  Resp: 14  Temp: (!) 96.7 F (35.9 C)  SpO2: 100%       Body mass index is 19.53 kg/m.    ECOG FS:1 -  Symptomatic but completely ambulatory  General: Well-developed, well-nourished, no acute distress. Eyes: Pink conjunctiva, anicteric sclera. HEENT: Normocephalic, moist mucous membranes.  Clear oropharynx. Lungs: No audible wheezing or coughing. Heart: Regular rate and rhythm. Abdomen: Soft, nontender, no obvious distention. Musculoskeletal: No edema, cyanosis, or clubbing. Neuro: Alert, answering all questions appropriately. Cranial nerves grossly intact. Skin: No rashes or petechiae noted. Psych: Normal affect.  LAB RESULTS:  Lab Results  Component Value Date   NA 136 06/15/2023   K 4.0 06/15/2023   CL 102 06/15/2023   CO2 25 06/15/2023   GLUCOSE 248 (H) 06/15/2023   BUN 22 06/15/2023   CREATININE 1.24 (H) 06/15/2023   CALCIUM 8.3 (L) 06/15/2023   PROT 6.8 04/12/2023   ALBUMIN 3.5 (L) 05/23/2023  AST 22 04/12/2023   ALT 8 04/12/2023   ALKPHOS 39 04/12/2023   BILITOT 0.9 04/12/2023   GFRNONAA 43 (L) 06/15/2023   GFRAA 59 (L) 09/24/2019    Lab Results  Component Value Date   WBC 3.3 (L) 06/15/2023   NEUTROABS 2.7 06/15/2023   HGB 9.1 (L) 06/15/2023   HCT 28.0 (L) 06/15/2023   MCV 91.5 06/15/2023   PLT 66 (L) 06/15/2023     STUDIES: No results found.  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  Patient has now completed concurrent XRT along with chemotherapy.  Her last dose of mitomycin  was on March 07, 2023.  She finished Xeloda  and XRT on March 25, 2023.  Repeat PET scan results from April 07, 2023 reviewed independently and reported as above with persistent hypermetabolism at the site of her anal cancer, but PET scan was done sooner than required given her  head and neck cancer and this may be residual inflammation/irritation from XRT.  Appreciate surgical input. 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  Initial plan was to give concurrent XRT along with weekly cisplatin .  But given patient's chronic thrombocytopenia, she has only received 2 doses of cisplatin  and none since May 13, 2023.  Delay treatment once again today.  Patient will instead receive IV fluids and IV magnesium .  Return to clinic in 1 week for further evaluation and consideration of treatment.   Thyroid  nodule with hypermetabolism: Defer ultrasound and biopsy until completion of treatment of 2 malignancies above. Anemia: Improved to 9.1 with transfusion.  Monitor.   Leukopenia: Chronic and unchanged.  Patient's total white blood cell count is 3.3 today.   Thrombocytopenia: Chronic and unchanged.  Patient platelet count of 66 today.  Delay treatment as above. Hypomagnesia: Magnesium  is 1.5 today.  4 g IV magnesium  as above.   Hypokalemia: Resolved.   Renal insufficiency: GFR decreased to 43.  IV fluids as above.  Hold cisplatin  as above. Dysphagia: Worse today.  XRT is currently on hold.  Continue oxycodone  and Magic mouthwash as needed.    Patient expressed understanding and was in agreement with this plan. She also understands that She can call clinic at any time with any questions, concerns, or complaints.    Cancer Staging  Anal squamous cell carcinoma (HCC) Staging form: Anus, AJCC V9 - Clinical stage from 01/18/2023: Stage IIIA (cT3, cN0, cM0) - Signed by Shellie Dials, MD on 01/18/2023 Stage prefix: Initial diagnosis  Right tonsillar squamous cell carcinoma (HCC) Staging form: Pharynx - P16 Negative Oropharynx, AJCC 8th Edition - Clinical stage from 04/12/2023: Stage III (cT3, cN1, cM0, p16: Unknown) - Signed by Shellie Dials, MD on 04/12/2023 Stage prefix: Initial diagnosis   Shellie Dials, MD   06/15/2023 9:10  AM

## 2023-06-16 ENCOUNTER — Other Ambulatory Visit: Payer: Self-pay

## 2023-06-16 ENCOUNTER — Other Ambulatory Visit

## 2023-06-16 ENCOUNTER — Encounter: Payer: Self-pay | Admitting: Oncology

## 2023-06-16 ENCOUNTER — Inpatient Hospital Stay

## 2023-06-16 ENCOUNTER — Telehealth: Payer: Self-pay | Admitting: *Deleted

## 2023-06-16 ENCOUNTER — Ambulatory Visit

## 2023-06-16 ENCOUNTER — Inpatient Hospital Stay: Admitting: Oncology

## 2023-06-16 MED ORDER — STERILE WATER FOR INJECTION IJ SOLN
5.0000 mL | Freq: Four times a day (QID) | ORAL | 3 refills | Status: DC | PRN
Start: 2023-06-16 — End: 2023-06-27
  Filled 2023-06-16: qty 480, 12d supply, fill #1
  Filled 2023-06-16 (×2): qty 480, 12d supply, fill #0

## 2023-06-16 NOTE — Telephone Encounter (Signed)
 Patient had called and said that she is having sores again in her mouth and she needed a refill of the Magic mouthwash.  Form filled out sent to [facility].  Called the patient and let her know that we sent today and just now.  Patient says that it is very nice

## 2023-06-17 ENCOUNTER — Ambulatory Visit

## 2023-06-17 ENCOUNTER — Inpatient Hospital Stay

## 2023-06-17 ENCOUNTER — Ambulatory Visit (INDEPENDENT_AMBULATORY_CARE_PROVIDER_SITE_OTHER): Admitting: Internal Medicine

## 2023-06-17 ENCOUNTER — Ambulatory Visit: Admitting: Oncology

## 2023-06-17 ENCOUNTER — Other Ambulatory Visit

## 2023-06-17 ENCOUNTER — Encounter: Payer: Self-pay | Admitting: Internal Medicine

## 2023-06-17 VITALS — BP 138/62 | HR 73 | Ht 66.0 in | Wt 129.0 lb

## 2023-06-17 DIAGNOSIS — Z024 Encounter for examination for driving license: Secondary | ICD-10-CM

## 2023-06-17 NOTE — Progress Notes (Signed)
 Airline pilot Medical Examination   April [name] is a 83 y.o. female who presents today for a commercial driver fitness determination physical exam. The patient reports no problems. The following portions of the patient's history were reviewed and updated as appropriate: allergies, current medications, past family history, past medical history, past social history, past surgical history, and problem list. Review of Systems  Past Medical History:  Diagnosis Date   Anemia    Diabetes mellitus without complication (HCC)    Gout    Hyperlipidemia    Hypertension    Wears dentures    full upper and lower    Current Outpatient Medications  Medication Sig Dispense Refill   Accu-Chek Softclix Lancets lancets      amLODipine (NORVASC) 10 MG tablet Take 10 mg by mouth daily.     aspirin 81 MG tablet Take 1 tablet by mouth daily.     capecitabine  (XELODA ) 500 MG tablet Take 2 tablets (1,000 mg total) by mouth 2 (two) times daily after a meal. Take Monday-Friday. Take only on days of radiation. 120 tablet 0   carvedilol  (COREG ) 3.125 MG tablet Take 1 tablet by mouth 2 (two) times daily. cardio     dexamethasone  (DECADRON ) 2 MG tablet Take 1 tablet (2 mg total) by mouth 2 (two) times daily with a meal. 45 tablet 0   diphenoxylate -atropine  (LOMOTIL ) 2.5-0.025 MG tablet Take 1 tablet by mouth 4 (four) times daily as needed for diarrhea or loose stools. 30 tablet 0   fluconazole  (DIFLUCAN ) 100 MG tablet Take 1 tablet (100 mg total) by mouth daily. 7 tablet 0   glucose blood (ACCU-CHEK AVIVA PLUS) test strip USE TWICE DAILY AS DIRECTED     lisinopril  (ZESTRIL ) 20 MG tablet TAKE 1 TABLET(20 MG) BY MOUTH DAILY 30 tablet 0   magic mouthwash (multi-ingredient) oral suspension Swish and swallow 5-10 mLs 4 (four) times daily as needed. 480 mL 3   magic mouthwash (multi-ingredient) oral suspension Swish and swallow 5-10 mLs 4 (four) times daily as needed. 480 mL 3   metFORMIN  (GLUCOPHAGE ) 500 MG tablet  One every morning 90 tablet 1   Morphine  Sulfate (MORPHINE  CONCENTRATE) 10 mg / 0.5 ml concentrated solution Take 1 mL (20 mg total) by mouth every 2 (two) hours as needed for severe pain (pain score 7-10). 15 mL 0   oxyCODONE  (ROXICODONE ) 5 MG/5ML solution Take 5 mLs (5 mg total) by mouth every 4 (four) hours as needed for severe pain (pain score 7-10). 100 mL 0   sucralfate  (CARAFATE ) 1 g tablet Take 1 tablet (1 g total) by mouth 4 (four) times daily -  with meals and at bedtime. Dissolve tablet in 4 tablespoons of warm water  then swish and swallow 30 minutes before meals. 120 tablet 0   tiZANidine  (ZANAFLEX ) 4 MG tablet Take 1 tablet (4 mg total) by mouth at bedtime. 90 tablet 1   No current facility-administered medications for this visit.   Facility-Administered Medications Ordered in Other Visits  Medication Dose Route Frequency Provider Last Rate Last Admin   0.9 %  sodium chloride  infusion   Intravenous Continuous Finnegan, Timothy J, MD   Stopped at 05/13/23 1535   morphine  (PF) 2 MG/ML injection             No Known Allergies  Family History  Problem Relation Age of Onset   Breast cancer Neg Hx     Social History   Socioeconomic History   Marital status: Widowed  Spouse name: Not on file   Number of children: Not on file   Years of education: Not on file   Highest education level: Not on file  Occupational History   Not on file  Tobacco Use   Smoking status: Former    Current packs/day: 0.00    Average packs/day: 0.5 packs/day for 8.0 years (4.0 ttl pk-yrs)    Types: Cigarettes    Start date: 2    Quit date: 80    Years since quitting: 35.3    Passive exposure: Past   Smokeless tobacco: Never  Vaping Use   Vaping status: Never Used  Substance and Sexual Activity   Alcohol use: No    Alcohol/week: 0.0 standard drinks of alcohol   Drug use: No   Sexual activity: Not Currently  Other Topics Concern   Not on file  Social History Narrative   Pt lives  with her son and his family   Social Drivers of Corporate investment banker Strain: Low Risk  (05/25/2023)   Received from [facility]   Overall Financial Resource Strain (CARDIA)    Difficulty of Paying Living Expenses: Not hard at all  Food Insecurity: No Food Insecurity (05/25/2023)   Received from [facility]   Hunger Vital Sign    Worried About Running Out of Food in the Last Year: Never true    Ran Out of Food in the Last Year: Never true  Transportation Needs: No Transportation Needs (05/25/2023)   Received from [facility] - Transportation    In the past 12 months, has lack of transportation kept you from medical appointments or from getting medications?: No    Lack of Transportation (Non-Medical): No  Physical Activity: Insufficiently Active (07/21/2022)   Exercise Vital Sign    Days of Exercise per Week: 3 days    Minutes of Exercise per Session: 30 min  Stress: No Stress Concern Present (07/21/2022)   [employer] of Occupational Health - Occupational Stress Questionnaire    Feeling of Stress : Not at all  Social Connections: Moderately Isolated (07/21/2022)   Social Connection and Isolation Panel [NHANES]    Frequency of Communication with Friends and Family: More than three times a week    Frequency of Social Gatherings with Friends and Family: More than three times a week    Attends Religious Services: More than 4 times per year    Active Member of [employer] or Organizations: No    Attends Banker Meetings: Never    Marital Status: Widowed  Intimate Partner Violence: Not At Risk (07/21/2022)   Humiliation, Afraid, Rape, and Kick questionnaire    Fear of Current or Ex-Partner: No    Emotionally Abused: No    Physically Abused: No    Sexually Abused: No     Constitutional: Denies fever, malaise, fatigue, headache or abrupt weight changes.  HEENT: Denies eye pain, eye redness, ear pain,  ringing in the ears, wax buildup, runny nose, nasal congestion, bloody nose, or sore throat. Respiratory: Denies difficulty breathing, shortness of breath, cough or sputum production.   Cardiovascular: Denies chest pain, chest tightness, palpitations or swelling in the hands or feet.  Gastrointestinal: Denies abdominal pain, bloating, constipation, diarrhea or blood in the stool.  GU: Denies urgency, frequency, pain with urination, burning sensation, blood in urine, odor or discharge. Musculoskeletal: Patient reports under joint pain.  Denies decrease in range of motion, difficulty with gait, muscle  pain or joint swelling.  Skin: Denies redness, rashes, lesions or ulcercations.  Neurological: Denies dizziness, difficulty with memory, difficulty with speech or problems with balance and coordination.  Psych: Denies anxiety, depression, SI/HI.  No other specific complaints in a complete review of systems (except as listed in HPI above).   Objective:    Vision:  Vision Screening   Right eye Left eye Both eyes  Without correction 20/25 20/20 20/20   With correction      Horizontal field of vision: Left 70 degrees, Right 70 degrees.  Applicant can recognize and distinguish among traffic control signals and devices showing standard red, green, and amber colors.     Monocular Vision?: No   Hearing:        Right Ear  5 ft     Left Ear  5 ft       BP 138/62 (BP Location: Left Arm, Patient Position: Sitting, Cuff Size: Normal)   Pulse 73   Ht 5\' 6"  (1.676 m)   Wt 129 lb (58.5 kg)   SpO2 98%   BMI 20.82 kg/m    Wt Readings from Last 3 Encounters:  06/15/23 121 lb (54.9 kg)  06/10/23 127 lb 9.6 oz (57.9 kg)  05/27/23 114 lb (51.7 kg)    General: Appears her stated age, well developed, well nourished in NAD. Skin: Warm, dry and intact. No rashes, lesions or ulcerations noted. HEENT: Head: normal shape and size; Eyes: sclera white, no icterus, conjunctiva pink, PERRLA and EOMs  intact; Ears: Tm's gray and intact, normal light reflex; Nose: mucosa pink and moist, septum midline; Throat/Mouth: Teeth present, mucosa pink and moist, no exudate, lesions or ulcerations noted.  Neck:  Neck supple, trachea midline. No masses, lumps or thyromegaly present.  Cardiovascular: Normal rate and rhythm. S1,S2 noted.  Murmur noted. No JVD or BLE edema. No carotid bruits noted. Pulmonary/Chest: Normal effort and positive vesicular breath sounds. No respiratory distress. No wheezes, rales or ronchi noted.  Abdomen: Soft and nontender. Normal bowel sounds. No distention or masses noted. Liver, spleen and kidneys non palpable. Musculoskeletal: Normal flexion and rotation of the spine.  Decreased extension of the cervical spine.  Normal internal and external rotation of the shoulders.  Shoulder shrug equal.  Normal abduction, adduction, internal and external rotation of bilateral hips.  Normal flexion extension of bilateral knees.  Strength 5/5 BUE/BLE.  No signs of joint swelling.  Able to stand on tiptoes and heels.  No difficulty with gait.  Neurological: Alert and oriented. Cranial nerves II-XII grossly intact. Coordination normal.  Psychiatric: Mood and affect normal. Behavior is normal. Judgment and thought content normal.     BMET    Component Value Date/Time   NA 136 06/15/2023 0815   NA 142 05/23/2023 1115   K 4.0 06/15/2023 0815   CL 102 06/15/2023 0815   CO2 25 06/15/2023 0815   GLUCOSE 248 (H) 06/15/2023 0815   BUN 22 06/15/2023 0815   BUN 9 05/23/2023 1115   CREATININE 1.24 (H) 06/15/2023 0815   CALCIUM 8.3 (L) 06/15/2023 0815   GFRNONAA 43 (L) 06/15/2023 0815   GFRAA 59 (L) 09/24/2019 1023    Lipid Panel     Component Value Date/Time   CHOL 128 12/02/2022 1041   TRIG 84 12/02/2022 1041   HDL 47 12/02/2022 1041   CHOLHDL 3.1 09/24/2019 1023   LDLCALC 65 12/02/2022 1041    CBC    Component Value Date/Time   WBC 3.3 (L) 06/15/2023 0815  WBC 3.5 (L)  06/06/2023 1242   RBC 3.06 (L) 06/15/2023 0815   HGB 9.1 (L) 06/15/2023 0815   HGB 8.4 (L) 01/04/2023 1205   HCT 28.0 (L) 06/15/2023 0815   HCT 27.1 (L) 01/04/2023 1205   PLT 66 (L) 06/15/2023 0815   PLT 222 01/04/2023 1205   MCV 91.5 06/15/2023 0815   MCV 87 01/04/2023 1205   MCH 29.7 06/15/2023 0815   MCHC 32.5 06/15/2023 0815   RDW 16.7 (H) 06/15/2023 0815   RDW 13.5 01/04/2023 1205   LYMPHSABS 0.4 (L) 06/15/2023 0815   LYMPHSABS 2.0 01/04/2023 1205   MONOABS 0.2 06/15/2023 0815   EOSABS 0.0 06/15/2023 0815   EOSABS 0.2 01/04/2023 1205   BASOSABS 0.0 06/15/2023 0815   BASOSABS 0.0 01/04/2023 1205    Hgb A1C Lab Results  Component Value Date   HGBA1C 6.6 04/13/2022        Labs:  Urinalysis:   Specific Gravity: 1.010  Blood: Negative  Protein: Negative  Glucose: Negative  Assessment:    Healthy female exam.  Meets standards, but periodic monitoring required due to HTN. DM2.  Driver qualified only for 1 year.    Plan:    Medical examiners certificate completed and printed. Return as needed.   Helayne Lo, NP

## 2023-06-20 ENCOUNTER — Other Ambulatory Visit: Payer: Self-pay

## 2023-06-20 ENCOUNTER — Ambulatory Visit
Admission: RE | Admit: 2023-06-20 | Discharge: 2023-06-20 | Disposition: A | Discharge status: Home / Self Care | Source: Ambulatory Visit | Attending: Radiation Oncology | Admitting: Radiation Oncology

## 2023-06-20 DIAGNOSIS — Z510 Encounter for antineoplastic radiation therapy: Secondary | ICD-10-CM | POA: Diagnosis not present

## 2023-06-20 LAB — RAD ONC ARIA SESSION SUMMARY
Course Elapsed Days: 49
Plan Fractions Treated to Date: 22
Plan Prescribed Dose Per Fraction: 2 Gy
Plan Total Fractions Prescribed: 35
Plan Total Prescribed Dose: 70 Gy
Reference Point Dosage Given to Date: 44 Gy
Reference Point Session Dosage Given: 2 Gy
Session Number: 22

## 2023-06-21 ENCOUNTER — Other Ambulatory Visit: Payer: Self-pay

## 2023-06-21 ENCOUNTER — Encounter: Payer: Self-pay | Admitting: Oncology

## 2023-06-21 ENCOUNTER — Ambulatory Visit
Admission: RE | Admit: 2023-06-21 | Discharge: 2023-06-21 | Disposition: A | Discharge status: Home / Self Care | Source: Ambulatory Visit | Attending: Radiation Oncology | Admitting: Radiation Oncology

## 2023-06-21 DIAGNOSIS — Z510 Encounter for antineoplastic radiation therapy: Secondary | ICD-10-CM | POA: Diagnosis not present

## 2023-06-21 LAB — RAD ONC ARIA SESSION SUMMARY
Course Elapsed Days: 50
Plan Fractions Treated to Date: 23
Plan Prescribed Dose Per Fraction: 2 Gy
Plan Total Fractions Prescribed: 35
Plan Total Prescribed Dose: 70 Gy
Reference Point Dosage Given to Date: 46 Gy
Reference Point Session Dosage Given: 2 Gy
Session Number: 23

## 2023-06-21 MED FILL — Fosaprepitant Dimeglumine For IV Infusion 150 MG (Base Eq): INTRAVENOUS | Qty: 5 | Status: AC

## 2023-06-22 ENCOUNTER — Inpatient Hospital Stay

## 2023-06-22 ENCOUNTER — Ambulatory Visit

## 2023-06-22 ENCOUNTER — Other Ambulatory Visit: Payer: Self-pay

## 2023-06-22 ENCOUNTER — Ambulatory Visit
Admission: RE | Admit: 2023-06-22 | Discharge: 2023-06-22 | Disposition: A | Discharge status: Home / Self Care | Source: Ambulatory Visit | Attending: Radiation Oncology | Admitting: Radiation Oncology

## 2023-06-22 ENCOUNTER — Ambulatory Visit: Admitting: Oncology

## 2023-06-22 ENCOUNTER — Encounter: Payer: Self-pay | Admitting: Oncology

## 2023-06-22 ENCOUNTER — Other Ambulatory Visit

## 2023-06-22 ENCOUNTER — Inpatient Hospital Stay (HOSPITAL_BASED_OUTPATIENT_CLINIC_OR_DEPARTMENT_OTHER): Admitting: Oncology

## 2023-06-22 VITALS — BP 144/63 | HR 75 | Resp 17

## 2023-06-22 VITALS — BP 157/58 | HR 64 | Temp 98.5°F | Resp 16 | Ht 66.0 in | Wt 134.6 lb

## 2023-06-22 DIAGNOSIS — Z510 Encounter for antineoplastic radiation therapy: Secondary | ICD-10-CM | POA: Diagnosis not present

## 2023-06-22 DIAGNOSIS — C099 Malignant neoplasm of tonsil, unspecified: Secondary | ICD-10-CM

## 2023-06-22 DIAGNOSIS — C210 Malignant neoplasm of anus, unspecified: Secondary | ICD-10-CM

## 2023-06-22 LAB — RAD ONC ARIA SESSION SUMMARY
Course Elapsed Days: 51
Plan Fractions Treated to Date: 24
Plan Prescribed Dose Per Fraction: 2 Gy
Plan Total Fractions Prescribed: 35
Plan Total Prescribed Dose: 70 Gy
Reference Point Dosage Given to Date: 48 Gy
Reference Point Session Dosage Given: 2 Gy
Session Number: 24

## 2023-06-22 LAB — CBC WITH DIFFERENTIAL (CANCER CENTER ONLY)
Abs Immature Granulocytes: 0.2 10*3/uL — ABNORMAL HIGH (ref 0.00–0.07)
Basophils Absolute: 0 10*3/uL (ref 0.0–0.1)
Basophils Relative: 0 %
Eosinophils Absolute: 0 10*3/uL (ref 0.0–0.5)
Eosinophils Relative: 0 %
HCT: 26.6 % — ABNORMAL LOW (ref 36.0–46.0)
Hemoglobin: 8.5 g/dL — ABNORMAL LOW (ref 12.0–15.0)
Immature Granulocytes: 4 %
Lymphocytes Relative: 7 %
Lymphs Abs: 0.3 10*3/uL — ABNORMAL LOW (ref 0.7–4.0)
MCH: 29.7 pg (ref 26.0–34.0)
MCHC: 32 g/dL (ref 30.0–36.0)
MCV: 93 fL (ref 80.0–100.0)
Monocytes Absolute: 0.4 10*3/uL (ref 0.1–1.0)
Monocytes Relative: 8 %
Neutro Abs: 3.8 10*3/uL (ref 1.7–7.7)
Neutrophils Relative %: 81 %
Platelet Count: 84 10*3/uL — ABNORMAL LOW (ref 150–400)
RBC: 2.86 MIL/uL — ABNORMAL LOW (ref 3.87–5.11)
RDW: 16.8 % — ABNORMAL HIGH (ref 11.5–15.5)
WBC Count: 4.8 10*3/uL (ref 4.0–10.5)
nRBC: 0.6 % — ABNORMAL HIGH (ref 0.0–0.2)

## 2023-06-22 LAB — BASIC METABOLIC PANEL - CANCER CENTER ONLY
Anion gap: 8 (ref 5–15)
BUN: 29 mg/dL — ABNORMAL HIGH (ref 8–23)
CO2: 25 mmol/L (ref 22–32)
Calcium: 8.7 mg/dL — ABNORMAL LOW (ref 8.9–10.3)
Chloride: 103 mmol/L (ref 98–111)
Creatinine: 1.29 mg/dL — ABNORMAL HIGH (ref 0.44–1.00)
GFR, Estimated: 41 mL/min — ABNORMAL LOW (ref >60)
Glucose, Bld: 199 mg/dL — ABNORMAL HIGH (ref 70–99)
Potassium: 4.5 mmol/L (ref 3.5–5.1)
Sodium: 136 mmol/L (ref 135–145)

## 2023-06-22 LAB — MAGNESIUM: Magnesium: 1.5 mg/dL — ABNORMAL LOW (ref 1.7–2.4)

## 2023-06-22 MED ORDER — HEPARIN SOD (PORK) LOCK FLUSH 100 UNIT/ML IV SOLN
500.0000 [IU] | Freq: Once | INTRAVENOUS | Status: AC | PRN
  Administered 2023-06-22: 500 [IU]
  Filled 2023-06-22: qty 5

## 2023-06-22 MED ORDER — SODIUM CHLORIDE 0.9 % IV SOLN
INTRAVENOUS | Status: DC
  Filled 2023-06-22: qty 250

## 2023-06-22 MED ORDER — MAGNESIUM SULFATE 4 GM/100ML IV SOLN
4.0000 g | Freq: Once | INTRAVENOUS | Status: AC
  Administered 2023-06-22: 4 g via INTRAVENOUS
  Filled 2023-06-22: qty 100

## 2023-06-22 MED ORDER — SODIUM CHLORIDE 0.9 % IV SOLN
30.0000 mg/m2 | Freq: Once | INTRAVENOUS | Status: AC
  Administered 2023-06-22: 50 mg via INTRAVENOUS
  Filled 2023-06-22: qty 50

## 2023-06-22 MED ORDER — DEXAMETHASONE SODIUM PHOSPHATE 10 MG/ML IJ SOLN
10.0000 mg | Freq: Once | INTRAMUSCULAR | Status: AC
  Administered 2023-06-22: 10 mg via INTRAVENOUS
  Filled 2023-06-22: qty 1

## 2023-06-22 MED ORDER — PALONOSETRON HCL INJECTION 0.25 MG/5ML
0.2500 mg | Freq: Once | INTRAVENOUS | Status: AC
  Administered 2023-06-22: 0.25 mg via INTRAVENOUS
  Filled 2023-06-22: qty 5

## 2023-06-22 MED ORDER — POTASSIUM CHLORIDE IN NACL 20-0.9 MEQ/L-% IV SOLN
Freq: Once | INTRAVENOUS | Status: AC
  Filled 2023-06-22: qty 1000

## 2023-06-22 MED ORDER — SODIUM CHLORIDE 0.9 % IV SOLN
150.0000 mg | Freq: Once | INTRAVENOUS | Status: AC
  Administered 2023-06-22: 150 mg via INTRAVENOUS
  Filled 2023-06-22 (×2): qty 5
  Filled 2023-06-22: qty 150
  Filled 2023-06-22 (×2): qty 5

## 2023-06-22 NOTE — Patient Instructions (Signed)
 [facility] - A DEPT OF [facility]  Discharge Instructions: Thank you for choosing [facility] to provide your oncology and hematology care.  If you have a lab appointment with the Cancer Center, please go directly to the Cancer Center and check in at the registration area.  Wear comfortable clothing and clothing appropriate for easy access to any Portacath or PICC line.   We strive to give you quality time with your provider. You may need to reschedule your appointment if you arrive late (15 or more minutes).  Arriving late affects you and other patients whose appointments are after yours.  Also, if you miss three or more appointments without notifying the office, you may be dismissed from the clinic at the provider's discretion.      For prescription refill requests, have your pharmacy contact our office and allow 72 hours for refills to be completed.    Today you received the following chemotherapy and/or immunotherapy agents Cisplatin      To help prevent nausea and vomiting after your treatment, we encourage you to take your nausea medication as directed.  BELOW ARE SYMPTOMS THAT SHOULD BE REPORTED IMMEDIATELY: *FEVER GREATER THAN 100.4 F (38 C) OR HIGHER *CHILLS OR SWEATING *NAUSEA AND VOMITING THAT IS NOT CONTROLLED WITH YOUR NAUSEA MEDICATION *UNUSUAL SHORTNESS OF BREATH *UNUSUAL BRUISING OR BLEEDING *URINARY PROBLEMS (pain or burning when urinating, or frequent urination) *BOWEL PROBLEMS (unusual diarrhea, constipation, pain near the anus) TENDERNESS IN MOUTH AND THROAT WITH OR WITHOUT PRESENCE OF ULCERS (sore throat, sores in mouth, or a toothache) UNUSUAL RASH, SWELLING OR PAIN  UNUSUAL VAGINAL DISCHARGE OR ITCHING   Items with * indicate a potential emergency and should be followed up as soon as possible or go to the Emergency Department if any problems should occur.  Please show the CHEMOTHERAPY ALERT CARD or IMMUNOTHERAPY  ALERT CARD at check-in to the Emergency Department and triage nurse.  Should you have questions after your visit or need to cancel or reschedule your appointment, please contact [facility] - A DEPT OF [facility]  [phone] and follow the prompts.  Office hours are 8:00 a.m. to 4:30 p.m. Monday - Friday. Please note that voicemails left after 4:00 p.m. may not be returned until the following business day.  We are closed weekends and major holidays. You have access to a nurse at all times for urgent questions. Please call the main number to the clinic [phone] and follow the prompts.  For any non-urgent questions, you may also contact your provider using MyChart. We now offer e-Visits for anyone 83 and older to request care online for non-urgent symptoms. For details visit [email or web address].   Also download the MyChart app! Go to the app store, search "MyChart", open the app, select [facility], and log in with your MyChart username and password.

## 2023-06-22 NOTE — Progress Notes (Signed)
 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  Patient has completed treatment for anus cancer.  Now receiving cisplatin  and radiation for head and neck cancer.  Radiation was on hold for 4 days last week due to dysphagia  Met with patient during infusion. Says that her appetite is good and she is eating everything.  Says she ate a chicken biscuit yesterday, spaghetti for lunch and chicken, mashed potatoes and green beans last night for dinner with honeybun cake.  Says that she does not have pain in her mouth or when swallowing.      Medications: reviewed  Labs: reviewed  Anthropometrics:   Weight 134 lb 9.6 oz today (bilateral edema noted).  Patient says that MD is aware  127 lb 9.6 oz on 5/2 114 lb on 4/18 122 lb on 4/14 121 lb on 4/11 121 lb on 4/8   NUTRITION DIAGNOSIS: Unintentional weight loss stable (fluid likely contributing to weight gain)    INTERVENTION:  Discussed importance of eating foods high in protein to help with edema Use magic mouthwash to help pain in mouth     MONITORING, EVALUATION, GOAL: weight trends, intake   NEXT VISIT: Wednesday, May 21 during infusion  Gennell How B. Zollie Hipp, CSO, LDN Registered Dietitian [phone]

## 2023-06-22 NOTE — Progress Notes (Signed)
 Has bilateral feet swelling that started 2-3 weeks ago.

## 2023-06-22 NOTE — Progress Notes (Signed)
 Ok to tx with plt = 84 per Dr Adrian Alba

## 2023-06-22 NOTE — Progress Notes (Signed)
 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  INTERVAL HISTORY: Patient returns to clinic today for further evaluation and reconsideration of cycle 3 of weekly cisplatin .  Her XRT has been reinitiated.  She currently feels well.  She does not complain of any further dysphagia today.  She continues to have chronic weakness and fatigue. She has no neurologic complaints.  She denies any recent fevers or illnesses. She has no chest pain, shortness of breath, cough, or hemoptysis.  She denies any nausea, vomiting, constipation, or diarrhea.  She has no melena or hematochezia.  She has no urinary complaints.  Patient offers no further specific complaints today.  REVIEW OF SYSTEMS:   Review of Systems  Constitutional:  Positive for malaise/fatigue. Negative for fever and weight loss.  HENT:  Negative for sore throat.   Respiratory: Negative.  Negative for cough, hemoptysis and shortness of breath.   Cardiovascular: Negative.  Negative for chest pain and leg swelling.  Gastrointestinal: Negative.  Negative for abdominal pain, blood in stool, diarrhea and melena.  Genitourinary: Negative.  Negative for dysuria.  Musculoskeletal: Negative.  Negative for back pain.  Skin: Negative.  Negative for rash.  Neurological:  Positive for weakness. Negative for dizziness, focal weakness and headaches.  Psychiatric/Behavioral: Negative.  The patient is not nervous/anxious.    As per  HPI. Otherwise, a complete review of systems is negative.  PAST MEDICAL HISTORY: Past Medical History:  Diagnosis Date   Anemia    Diabetes mellitus without complication (HCC)    Gout    Hyperlipidemia    Hypertension    Wears dentures    full upper and lower    PAST SURGICAL HISTORY: Past Surgical History:  Procedure Laterality Date   APPENDECTOMY     gallstones  06/12/2009   MICROLARYNGOSCOPY Right 01/28/2023   Procedure: MICRODIRECT LARYNGOSCOPY WITH BIOPSY OF OROPHARYNGEAL MASS;  Surgeon: Lesly Raspberry, MD;  Location: [facility];  Service: ENT;  Laterality: Right;  Diabetic   PORTACATH PLACEMENT N/A 02/18/2023   Procedure: INSERTION PORT-A-CATH;  Surgeon: Flynn Hylan, MD;  Location: [facility];  Service: General;  Laterality: N/A;   TONSILLECTOMY      FAMILY HISTORY: Family History  Problem Relation Age of Onset   Breast cancer Neg Hx     ADVANCED DIRECTIVES (Y/N):  N  HEALTH MAINTENANCE: Social History   Tobacco Use   Smoking status: Former    Current packs/day: 0.00    Average packs/day: 0.5 packs/day for 8.0 years (4.0 ttl pk-yrs)    Types: Cigarettes    Start date: 1    Quit date: 1990    Years since quitting: 35.3    Passive exposure: Past   Smokeless tobacco: Never  Vaping Use   Vaping status: Never Used  Substance Use Topics   Alcohol use: No    Alcohol/week: 0.0 standard drinks of alcohol   Drug use: No     Colonoscopy:  PAP:  Bone density:  Lipid panel:  No Known Allergies  Current Outpatient Medications  Medication  Sig Dispense Refill   Accu-Chek Softclix Lancets lancets      amLODipine (NORVASC) 10 MG tablet Take 10 mg by mouth daily.     aspirin 81 MG tablet Take 1 tablet by mouth daily.     carvedilol  (COREG ) 3.125 MG tablet Take 1 tablet by mouth 2 (two) times daily. cardio     glucose blood (ACCU-CHEK AVIVA PLUS) test strip USE TWICE DAILY AS DIRECTED     hydrochlorothiazide  (MICROZIDE ) 12.5 MG capsule Take 12.5  mg by mouth.     magic mouthwash (multi-ingredient) oral suspension Swish and swallow 5-10 mLs 4 (four) times daily as needed. 480 mL 3   metFORMIN  (GLUCOPHAGE ) 500 MG tablet One every morning 90 tablet 1   capecitabine  (XELODA ) 500 MG tablet Take 2 tablets (1,000 mg total) by mouth 2 (two) times daily after a meal. Take Monday-Friday. Take only on days of radiation. (Patient not taking: Reported on 06/22/2023) 120 tablet 0   dexamethasone  (DECADRON ) 2 MG tablet Take 1 tablet (2 mg total) by mouth 2 (two) times daily with a meal. (Patient not taking: Reported on 06/22/2023) 45 tablet 0   diphenoxylate -atropine  (LOMOTIL ) 2.5-0.025 MG tablet Take 1 tablet by mouth 4 (four) times daily as needed for diarrhea or loose stools. 30 tablet 0   fluconazole  (DIFLUCAN ) 100 MG tablet Take 1 tablet (100 mg total) by mouth daily. (Patient not taking: Reported on 06/22/2023) 7 tablet 0   lisinopril  (ZESTRIL ) 20 MG tablet TAKE 1 TABLET(20 MG) BY MOUTH DAILY (Patient not taking: Reported on 06/22/2023) 30 tablet 0   lisinopril  (ZESTRIL ) 5 MG tablet Take 5 mg by mouth.     Morphine  Sulfate (MORPHINE  CONCENTRATE) 10 mg / 0.5 ml concentrated solution Take 1 mL (20 mg total) by mouth every 2 (two) hours as needed for severe pain (pain score 7-10). (Patient not taking: Reported on 06/22/2023) 15 mL 0   oxyCODONE  (ROXICODONE ) 5 MG/5ML solution Take 5 mLs (5 mg total) by mouth every 4 (four) hours as needed for severe pain (pain score 7-10). (Patient not taking: Reported on 06/22/2023) 100 mL 0   sucralfate  (CARAFATE ) 1 g tablet Take 1 tablet (1 g total) by mouth 4 (four) times daily -  with meals and at bedtime. Dissolve tablet in 4 tablespoons of warm water  then swish and swallow 30 minutes before meals. (Patient not taking: Reported on 06/22/2023) 120 tablet 0   tiZANidine  (ZANAFLEX ) 4 MG tablet Take 1 tablet (4 mg total) by mouth at bedtime. (Patient not taking: Reported on 06/17/2023) 90 tablet 1   No current  facility-administered medications for this visit.   Facility-Administered Medications Ordered in Other Visits  Medication Dose Route Frequency Provider Last Rate Last Admin   0.9 %  sodium chloride  infusion   Intravenous Continuous Abrial Arrighi J, MD   Stopped at 05/13/23 1535   0.9 %  sodium chloride  infusion   Intravenous Continuous Madgeline Rayo J, MD 10 mL/hr at 06/22/23 1005 New Bag at 06/22/23 1005   CISplatin  (PLATINOL ) 50 mg in sodium chloride  0.9 % 250 mL chemo infusion  30 mg/m2 (Treatment Plan Recorded) Intravenous Once Eoghan Belcher J, MD 300 mL/hr at 06/22/23 1241 50 mg at 06/22/23 1241   heparin  lock flush 100 unit/mL  500 Units Intracatheter Once PRN Danaysha Kirn J, MD       morphine  (PF) 2 MG/ML injection             OBJECTIVE: Vitals:   06/22/23 0843  BP: Aaron Aas)  157/58  Pulse: 64  Resp: 16  Temp: 98.5 F (36.9 C)  SpO2: 100%       Body mass index is 21.73 kg/m.    ECOG FS:1 - Symptomatic but completely ambulatory  General: Well-developed, well-nourished, no acute distress.  Sitting in a wheelchair. Eyes: Pink conjunctiva, anicteric sclera. HEENT: Normocephalic, moist mucous membranes. Lungs: No audible wheezing or coughing. Heart: Regular rate and rhythm. Abdomen: Soft, nontender, no obvious distention. Musculoskeletal: No edema, cyanosis, or clubbing. Neuro: Alert, answering all questions appropriately. Cranial nerves grossly intact. Skin: No rashes or petechiae noted. Psych: Normal affect.  LAB RESULTS:  Lab Results  Component Value Date   NA 136 06/22/2023   K 4.5 06/22/2023   CL 103 06/22/2023   CO2 25 06/22/2023   GLUCOSE 199 (H) 06/22/2023   BUN 29 (H) 06/22/2023   CREATININE 1.29 (H) 06/22/2023   CALCIUM 8.7 (L) 06/22/2023   PROT 6.8 04/12/2023   ALBUMIN 3.5 (L) 05/23/2023   AST 22 04/12/2023   ALT 8 04/12/2023   ALKPHOS 39 04/12/2023   BILITOT 0.9 04/12/2023   GFRNONAA 41 (L) 06/22/2023   GFRAA 59 (L) 09/24/2019    Lab  Results  Component Value Date   WBC 4.8 06/22/2023   NEUTROABS 3.8 06/22/2023   HGB 8.5 (L) 06/22/2023   HCT 26.6 (L) 06/22/2023   MCV 93.0 06/22/2023   PLT 84 (L) 06/22/2023     STUDIES: No results found.  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  Patient has now completed concurrent XRT along with chemotherapy.  Her last dose of mitomycin  was on March 07, 2023.  She finished Xeloda  and XRT on March 25, 2023.  Repeat PET scan results from April 07, 2023 reviewed independently with persistent hypermetabolism at the site of her anal cancer, but PET scan was done sooner than required given her head and neck cancer and this may be residual inflammation/irritation from XRT.  Appreciate surgical input. 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  Initial plan was to give concurrent XRT along with weekly cisplatin .  Patient's platelet count has improved to greater than 80, therefore we will give patient her third treatment of cisplatin .  She has not received any chemotherapy since May 13, 2023.  Return to clinic in 1 week for further evaluation and consideration of treatment. Thyroid  nodule with hypermetabolism: Defer ultrasound and biopsy until completion of treatment of 2 malignancies above. Anemia: Hemoglobin is trended down to 8.5, monitor. Leukopenia: Resolved.   Thrombocytopenia: Platelet count improved to 84.  Proceed cautiously with dose reduced treatment as above. Hypomagnesia: Chronic and unchanged.  Patient will receive IV magnesium  with treatment today.   Hypokalemia: Resolved.   Renal insufficiency: Chronic and unchanged.  Patient's GFR is  41.  Proceed cautiously with treatment as above.   Dysphagia: Improved.  Patient is reinitiated XRT.  Continue oxycodone  and Magic mouthwash as needed.    Patient expressed understanding and was in agreement with this plan. She also understands that She can call clinic at any time with any questions, concerns, or complaints.    Cancer Staging  Anal squamous cell carcinoma (HCC) Staging form: Anus, AJCC V9 -  Clinical stage from 01/18/2023: Stage IIIA (cT3, cN0, cM0) - Signed by Shellie Dials, MD on 01/18/2023 Stage prefix: Initial diagnosis  Right tonsillar squamous cell carcinoma (HCC) Staging form: Pharynx - P16 Negative Oropharynx, AJCC 8th Edition - Clinical stage from 04/12/2023: Stage III (cT3, cN1, cM0, p16: Unknown) - Signed by Shellie Dials, MD on 04/12/2023 Stage prefix: Initial diagnosis   Shellie Dials, MD   06/22/2023 1:19 PM

## 2023-06-23 ENCOUNTER — Ambulatory Visit
Admission: RE | Admit: 2023-06-23 | Discharge: 2023-06-23 | Disposition: A | Discharge status: Home / Self Care | Source: Ambulatory Visit | Attending: Radiation Oncology | Admitting: Radiation Oncology

## 2023-06-23 ENCOUNTER — Other Ambulatory Visit: Payer: Self-pay

## 2023-06-23 DIAGNOSIS — Z510 Encounter for antineoplastic radiation therapy: Secondary | ICD-10-CM | POA: Diagnosis not present

## 2023-06-23 LAB — RAD ONC ARIA SESSION SUMMARY
Course Elapsed Days: 52
Plan Fractions Treated to Date: 25
Plan Prescribed Dose Per Fraction: 2 Gy
Plan Total Fractions Prescribed: 35
Plan Total Prescribed Dose: 70 Gy
Reference Point Dosage Given to Date: 50 Gy
Reference Point Session Dosage Given: 2 Gy
Session Number: 25

## 2023-06-24 ENCOUNTER — Other Ambulatory Visit: Payer: Self-pay

## 2023-06-24 ENCOUNTER — Ambulatory Visit: Admitting: Oncology

## 2023-06-24 ENCOUNTER — Ambulatory Visit
Admission: RE | Admit: 2023-06-24 | Discharge: 2023-06-24 | Disposition: A | Discharge status: Home / Self Care | Source: Ambulatory Visit | Attending: Radiation Oncology | Admitting: Radiation Oncology

## 2023-06-24 ENCOUNTER — Other Ambulatory Visit

## 2023-06-24 ENCOUNTER — Ambulatory Visit

## 2023-06-24 DIAGNOSIS — Z510 Encounter for antineoplastic radiation therapy: Secondary | ICD-10-CM | POA: Diagnosis not present

## 2023-06-24 LAB — RAD ONC ARIA SESSION SUMMARY
Course Elapsed Days: 53
Plan Fractions Treated to Date: 26
Plan Prescribed Dose Per Fraction: 2 Gy
Plan Total Fractions Prescribed: 35
Plan Total Prescribed Dose: 70 Gy
Reference Point Dosage Given to Date: 52 Gy
Reference Point Session Dosage Given: 2 Gy
Session Number: 26

## 2023-06-27 ENCOUNTER — Ambulatory Visit

## 2023-06-27 ENCOUNTER — Other Ambulatory Visit: Payer: Self-pay

## 2023-06-27 ENCOUNTER — Other Ambulatory Visit: Payer: Self-pay | Admitting: *Deleted

## 2023-06-27 ENCOUNTER — Inpatient Hospital Stay

## 2023-06-27 ENCOUNTER — Inpatient Hospital Stay: Admitting: Hospice and Palliative Medicine

## 2023-06-27 ENCOUNTER — Ambulatory Visit
Admission: RE | Admit: 2023-06-27 | Discharge: 2023-06-27 | Disposition: A | Discharge status: Home / Self Care | Source: Ambulatory Visit | Attending: Radiation Oncology | Admitting: Radiation Oncology

## 2023-06-27 VITALS — BP 152/50 | HR 61 | Temp 98.3°F | Resp 18

## 2023-06-27 DIAGNOSIS — Z510 Encounter for antineoplastic radiation therapy: Secondary | ICD-10-CM | POA: Diagnosis not present

## 2023-06-27 DIAGNOSIS — R601 Generalized edema: Secondary | ICD-10-CM

## 2023-06-27 DIAGNOSIS — C099 Malignant neoplasm of tonsil, unspecified: Secondary | ICD-10-CM

## 2023-06-27 LAB — CBC WITH DIFFERENTIAL (CANCER CENTER ONLY)
Abs Immature Granulocytes: 0.1 10*3/uL — ABNORMAL HIGH (ref 0.00–0.07)
Basophils Absolute: 0 10*3/uL (ref 0.0–0.1)
Basophils Relative: 0 %
Eosinophils Absolute: 0 10*3/uL (ref 0.0–0.5)
Eosinophils Relative: 0 %
HCT: 29.1 % — ABNORMAL LOW (ref 36.0–46.0)
Hemoglobin: 9.4 g/dL — ABNORMAL LOW (ref 12.0–15.0)
Immature Granulocytes: 2 %
Lymphocytes Relative: 4 %
Lymphs Abs: 0.2 10*3/uL — ABNORMAL LOW (ref 0.7–4.0)
MCH: 30 pg (ref 26.0–34.0)
MCHC: 32.3 g/dL (ref 30.0–36.0)
MCV: 93 fL (ref 80.0–100.0)
Monocytes Absolute: 0.4 10*3/uL (ref 0.1–1.0)
Monocytes Relative: 6 %
Neutro Abs: 5.8 10*3/uL (ref 1.7–7.7)
Neutrophils Relative %: 88 %
Platelet Count: 87 10*3/uL — ABNORMAL LOW (ref 150–400)
RBC: 3.13 MIL/uL — ABNORMAL LOW (ref 3.87–5.11)
RDW: 16.7 % — ABNORMAL HIGH (ref 11.5–15.5)
WBC Count: 6.5 10*3/uL (ref 4.0–10.5)
nRBC: 0 % (ref 0.0–0.2)

## 2023-06-27 LAB — CMP (CANCER CENTER ONLY)
ALT: 13 U/L (ref 0–44)
AST: 16 U/L (ref 15–41)
Albumin: 3.3 g/dL — ABNORMAL LOW (ref 3.5–5.0)
Alkaline Phosphatase: 56 U/L (ref 38–126)
Anion gap: 8 (ref 5–15)
BUN: 36 mg/dL — ABNORMAL HIGH (ref 8–23)
CO2: 27 mmol/L (ref 22–32)
Calcium: 8.8 mg/dL — ABNORMAL LOW (ref 8.9–10.3)
Chloride: 102 mmol/L (ref 98–111)
Creatinine: 1.33 mg/dL — ABNORMAL HIGH (ref 0.44–1.00)
GFR, Estimated: 39 mL/min — ABNORMAL LOW (ref >60)
Glucose, Bld: 135 mg/dL — ABNORMAL HIGH (ref 70–99)
Potassium: 4.6 mmol/L (ref 3.5–5.1)
Sodium: 137 mmol/L (ref 135–145)
Total Bilirubin: 0.4 mg/dL (ref 0.0–1.2)
Total Protein: 6.5 g/dL (ref 6.5–8.1)

## 2023-06-27 LAB — BRAIN NATRIURETIC PEPTIDE: B Natriuretic Peptide: 149.7 pg/mL — ABNORMAL HIGH (ref 0.0–100.0)

## 2023-06-27 MED ORDER — STERILE WATER FOR INJECTION IJ SOLN
5.0000 mL | Freq: Four times a day (QID) | OROMUCOSAL | 3 refills | Status: DC | PRN
Start: 2023-06-27 — End: 2023-08-23
  Filled 2023-06-27: qty 480, 12d supply, fill #0

## 2023-06-27 MED ORDER — FLUCONAZOLE 100 MG PO TABS: 100.0000 mg | ORAL_TABLET | Freq: Every day | ORAL | 0 refills | Status: DC

## 2023-06-27 MED ORDER — OXYCODONE HCL 5 MG PO TABS
5.0000 mg | ORAL_TABLET | Freq: Once | ORAL | Status: AC
  Administered 2023-06-27: 5 mg via ORAL
  Filled 2023-06-27: qty 1

## 2023-06-27 MED ORDER — FLUCONAZOLE 100 MG PO TABS
ORAL_TABLET | ORAL | 0 refills | Status: DC
  Filled 2023-06-27: qty 14, 13d supply, fill #0

## 2023-06-27 NOTE — Progress Notes (Signed)
 Symptom Management Clinic [facility] Telephone:([phone] Fax:(336) [phone]  Patient Care Team: Clarise Crooks, MD as PCP - General (Family Medicine) Berton Brock, MD as Consulting Physician (Internal Medicine) Rochell Chroman, RN as Oncology Nurse Navigator Adrian Alba, Deadra Everts, MD as Consulting Physician (Oncology) Glenis Langdon, MD as Consulting Physician (Radiation Oncology)   NAME OF PATIENT: April [name]  [id number]  [birth year]   DATE OF VISIT: 06/27/23  REASON FOR CONSULT: April [name] is a 83 y.o. female with multiple medical problems including stage IIIa squamous cell carcinoma of the anus also with head neck cancer.   INTERVAL HISTORY: Patient is status post concurrent XRT with chemotherapy.  She completed Xeloda  on March 25, 2023 for anal cancer.  She is now on concurrent chemoradiation for head neck cancer.  Patient was referred to [facility] by radiation oncology for evaluation of sore throat and facial swelling.  Patient denies fever or chills.  Denies respiratory symptoms.  No shortness of breath or chest pain.  Reports reduced intake.  No rashes.  Denies nausea, vomiting, diarrhea.  No urinary symptoms.  She states that sore throat has persisted over the past several days.  Also reports several days of bilateral lower extremity edema.  Patient offers no further specific complaints today.   PAST MEDICAL HISTORY: Past Medical History:  Diagnosis Date  . Anemia   . Diabetes mellitus without complication (HCC)   . Gout   . Hyperlipidemia   . Hypertension   . Wears dentures    full upper and lower    PAST SURGICAL HISTORY:  Past Surgical History:  Procedure Laterality Date  . APPENDECTOMY    . gallstones  06/12/2009  . MICROLARYNGOSCOPY Right 01/28/2023   Procedure: MICRODIRECT LARYNGOSCOPY WITH BIOPSY OF OROPHARYNGEAL MASS;  Surgeon: Lesly Raspberry, MD;  Location: [facility];  Service: ENT;   Laterality: Right;  Diabetic  . PORTACATH PLACEMENT N/A 02/18/2023   Procedure: INSERTION PORT-A-CATH;  Surgeon: Flynn Hylan, MD;  Location: [facility] OR[facility];  Service: General;  Laterality: N/A;  . TONSILLECTOMY      HEMATOLOGY/ONCOLOGY HISTORY:  Oncology History  Anal squamous cell carcinoma (HCC)  01/18/2023 Initial Diagnosis   Anal squamous cell carcinoma (HCC)   01/18/2023 Cancer Staging   Staging form: Anus, AJCC V9 - Clinical stage from 01/18/2023: Stage IIIA (cT3, cN0, cM0) - Signed by Shellie Dials, MD on 01/18/2023 Stage prefix: Initial diagnosis   02/07/2023 - 03/07/2023 Chemotherapy   Patient is on Treatment Plan : ANUS Mitomycin  D1,29 + Capecitabine  + XRT     Right tonsillar squamous cell carcinoma (HCC)  04/12/2023 Initial Diagnosis   Right tonsillar squamous cell carcinoma (HCC)   04/12/2023 Cancer Staging   Staging form: Pharynx - P16 Negative Oropharynx, AJCC 8th Edition - Clinical stage from 04/12/2023: Stage III (cT3, cN1, cM0, p16: Unknown) - Signed by Shellie Dials, MD on 04/12/2023 Stage prefix: Initial diagnosis   05/05/2023 -  Chemotherapy   Patient is on Treatment Plan : HEAD/NECK Cisplatin  (40) q7d       ALLERGIES:  has no known allergies.  MEDICATIONS:  Current Outpatient Medications  Medication Sig Dispense Refill  . Accu-Chek Softclix Lancets lancets     . amLODipine (NORVASC) 10 MG tablet Take 10 mg by mouth daily.    Aaron Aas aspirin 81 MG tablet Take 1 tablet by mouth daily.    . carvedilol  (COREG ) 3.125 MG tablet Take 1 tablet by mouth 2 (two) times daily.  cardio    . glucose blood (ACCU-CHEK AVIVA PLUS) test strip USE TWICE DAILY AS DIRECTED    . hydrochlorothiazide  (MICROZIDE ) 12.5 MG capsule Take 12.5 mg by mouth.    . lisinopril  (ZESTRIL ) 5 MG tablet Take 5 mg by mouth.    . magic mouthwash (multi-ingredient) oral suspension Swish and swallow 5-10 mLs 4 (four) times daily as needed. 480 mL 3  . metFORMIN  (GLUCOPHAGE ) 500 MG tablet One every  morning 90 tablet 1  . capecitabine  (XELODA ) 500 MG tablet Take 2 tablets (1,000 mg total) by mouth 2 (two) times daily after a meal. Take Monday-Friday. Take only on days of radiation. (Patient not taking: Reported on 06/17/2023) 120 tablet 0  . dexamethasone  (DECADRON ) 2 MG tablet Take 1 tablet (2 mg total) by mouth 2 (two) times daily with a meal. (Patient not taking: Reported on 06/17/2023) 45 tablet 0  . diphenoxylate -atropine  (LOMOTIL ) 2.5-0.025 MG tablet Take 1 tablet by mouth 4 (four) times daily as needed for diarrhea or loose stools. (Patient not taking: Reported on 06/27/2023) 30 tablet 0  . fluconazole  (DIFLUCAN ) 100 MG tablet Take 1 tablet (100 mg total) by mouth daily for 7 days. 7 tablet 0  . oxyCODONE  (ROXICODONE ) 5 MG/5ML solution Take 5 mLs (5 mg total) by mouth every 4 (four) hours as needed for severe pain (pain score 7-10). (Patient not taking: Reported on 06/27/2023) 100 mL 0  . sucralfate  (CARAFATE ) 1 g tablet Take 1 tablet (1 g total) by mouth 4 (four) times daily -  with meals and at bedtime. Dissolve tablet in 4 tablespoons of warm water  then swish and swallow 30 minutes before meals. (Patient not taking: Reported on 06/17/2023) 120 tablet 0  . tiZANidine  (ZANAFLEX ) 4 MG tablet Take 1 tablet (4 mg total) by mouth at bedtime. (Patient not taking: Reported on 06/17/2023) 90 tablet 1   No current facility-administered medications for this visit.   Facility-Administered Medications Ordered in Other Visits  Medication Dose Route Frequency Provider Last Rate Last Admin  . 0.9 %  sodium chloride  infusion   Intravenous Continuous Finnegan, Timothy J, MD   Stopped at 05/13/23 1535  . morphine  (PF) 2 MG/ML injection             VITAL SIGNS: BP (!) 152/50   Pulse 61   Temp 98.3 F (36.8 C) (Tympanic)   Resp 18  There were no vitals filed for this visit.   Estimated body mass index is 21.73 kg/m as calculated from the following:   Height as of 06/22/23: 5\' 6"  (1.676 m).   Weight as of  06/22/23: 134 lb 9.6 oz (61.1 kg).  LABS: CBC:    Component Value Date/Time   WBC 4.8 06/22/2023 0826   WBC 3.5 (L) 06/06/2023 1242   HGB 8.5 (L) 06/22/2023 0826   HGB 8.4 (L) 01/04/2023 1205   HCT 26.6 (L) 06/22/2023 0826   HCT 27.1 (L) 01/04/2023 1205   PLT 84 (L) 06/22/2023 0826   PLT 222 01/04/2023 1205   MCV 93.0 06/22/2023 0826   MCV 87 01/04/2023 1205   NEUTROABS 3.8 06/22/2023 0826   NEUTROABS 3.4 01/04/2023 1205   LYMPHSABS 0.3 (L) 06/22/2023 0826   LYMPHSABS 2.0 01/04/2023 1205   MONOABS 0.4 06/22/2023 0826   EOSABS 0.0 06/22/2023 0826   EOSABS 0.2 01/04/2023 1205   BASOSABS 0.0 06/22/2023 0826   BASOSABS 0.0 01/04/2023 1205   Comprehensive Metabolic Panel:    Component Value Date/Time   NA 136 06/22/2023  0826   NA 142 05/23/2023 1115   K 4.5 06/22/2023 0826   CL 103 06/22/2023 0826   CO2 25 06/22/2023 0826   BUN 29 (H) 06/22/2023 0826   BUN 9 05/23/2023 1115   CREATININE 1.29 (H) 06/22/2023 0826   GLUCOSE 199 (H) 06/22/2023 0826   CALCIUM 8.7 (L) 06/22/2023 0826   AST 22 04/12/2023 1043   ALT 8 04/12/2023 1043   ALKPHOS 39 04/12/2023 1043   BILITOT 0.9 04/12/2023 1043   PROT 6.8 04/12/2023 1043   PROT 7.0 06/01/2022 1017   ALBUMIN 3.5 (L) 05/23/2023 1115    RADIOGRAPHIC STUDIES: No results found.   PERFORMANCE STATUS (ECOG) : 1 - Symptomatic but completely ambulatory  Review of Systems Unless otherwise noted, a complete review of systems is negative.  Physical Exam General: NAD HEENT: No evidence of thrush or significant lymphadenopathy Cardiovascular: regular rate and rhythm Pulmonary: clear ant fields Abdomen: soft, nontender, + bowel sounds GU: no suprapubic tenderness Extremities: no edema, no joint deformities Skin: no rashes Neurological: nonfocal  IMPRESSION/PLAN: Anal cancer -status post concurrent chemoradiation  Head neck cancer -on chemoradiation  Sore throat -patient with evidence of thrush.  Also symptoms likely  attributed to ongoing XRT/radiation esophagitis.  Will start patient on 2-week course of fluconazole .  Will refill Magic mouthwash.  Neoplasm related pain -continue oxycodone   Lower extremity edema -low suspicion for DVT.  Negative Homans' sign.  Recommend conservative treatment with elevating feet and external compression.  Case and plan discussed with Dr. Adrian Alba. Patient will RTC in 2 days.    Patient expressed understanding and was in agreement with this plan. She also understands that She can call clinic at any time with any questions, concerns, or complaints.   Thank you for allowing me to participate in the care of this very pleasant patient.   Time Total: 15 minutes  Visit consisted of counseling and education dealing with the complex and emotionally intense issues of symptom management in the setting of serious illness.Greater than 50%  of this time was spent counseling and coordinating care related to the above assessment and plan.  Signed by: Gerilyn Kobus, PhD, NP-C

## 2023-06-28 ENCOUNTER — Telehealth: Payer: Self-pay | Admitting: *Deleted

## 2023-06-28 ENCOUNTER — Ambulatory Visit

## 2023-06-28 ENCOUNTER — Other Ambulatory Visit: Payer: Self-pay

## 2023-06-28 ENCOUNTER — Other Ambulatory Visit: Payer: Self-pay | Admitting: *Deleted

## 2023-06-28 DIAGNOSIS — C099 Malignant neoplasm of tonsil, unspecified: Secondary | ICD-10-CM

## 2023-06-28 NOTE — Telephone Encounter (Signed)
 Patient called and said that she is having a lot of pain and she wanted to know if it is okay to take the oxycodone  liquid.  I told her please do that.  Then she said that she has gotten a lot of swelling around her at the neck area but she has Right tonsillar squamous carcinoma.  I sent a message to Nils Baseman and she will talk to Dr. Adrian Alba whether or not she needs to come in tomorrow.

## 2023-06-28 NOTE — Telephone Encounter (Signed)
 I called the patient back and told her that Dr. Adrian Alba still wants to see and we will go from there.  Starts at 8 AM to get radiation.  She says that she will be there like we need to.

## 2023-06-29 ENCOUNTER — Other Ambulatory Visit

## 2023-06-29 ENCOUNTER — Ambulatory Visit

## 2023-06-29 ENCOUNTER — Inpatient Hospital Stay (HOSPITAL_BASED_OUTPATIENT_CLINIC_OR_DEPARTMENT_OTHER): Admitting: Oncology

## 2023-06-29 ENCOUNTER — Inpatient Hospital Stay

## 2023-06-29 ENCOUNTER — Encounter: Payer: Self-pay | Admitting: Oncology

## 2023-06-29 VITALS — BP 123/53 | HR 69 | Temp 98.0°F | Resp 18 | Ht 66.0 in | Wt 135.6 lb

## 2023-06-29 VITALS — BP 115/41

## 2023-06-29 DIAGNOSIS — C099 Malignant neoplasm of tonsil, unspecified: Secondary | ICD-10-CM

## 2023-06-29 DIAGNOSIS — Z510 Encounter for antineoplastic radiation therapy: Secondary | ICD-10-CM | POA: Diagnosis not present

## 2023-06-29 LAB — CMP (CANCER CENTER ONLY)
ALT: 11 U/L (ref 0–44)
AST: 18 U/L (ref 15–41)
Albumin: 3 g/dL — ABNORMAL LOW (ref 3.5–5.0)
Alkaline Phosphatase: 51 U/L (ref 38–126)
Anion gap: 8 (ref 5–15)
BUN: 33 mg/dL — ABNORMAL HIGH (ref 8–23)
CO2: 26 mmol/L (ref 22–32)
Calcium: 8.3 mg/dL — ABNORMAL LOW (ref 8.9–10.3)
Chloride: 102 mmol/L (ref 98–111)
Creatinine: 1.22 mg/dL — ABNORMAL HIGH (ref 0.44–1.00)
GFR, Estimated: 44 mL/min — ABNORMAL LOW (ref >60)
Glucose, Bld: 144 mg/dL — ABNORMAL HIGH (ref 70–99)
Potassium: 4.2 mmol/L (ref 3.5–5.1)
Sodium: 136 mmol/L (ref 135–145)
Total Bilirubin: 0.7 mg/dL (ref 0.0–1.2)
Total Protein: 6 g/dL — ABNORMAL LOW (ref 6.5–8.1)

## 2023-06-29 LAB — CBC WITH DIFFERENTIAL (CANCER CENTER ONLY)
Abs Immature Granulocytes: 0.1 10*3/uL — ABNORMAL HIGH (ref 0.00–0.07)
Basophils Absolute: 0 10*3/uL (ref 0.0–0.1)
Basophils Relative: 0 %
Eosinophils Absolute: 0 10*3/uL (ref 0.0–0.5)
Eosinophils Relative: 0 %
HCT: 25.9 % — ABNORMAL LOW (ref 36.0–46.0)
Hemoglobin: 8.5 g/dL — ABNORMAL LOW (ref 12.0–15.0)
Immature Granulocytes: 2 %
Lymphocytes Relative: 6 %
Lymphs Abs: 0.3 10*3/uL — ABNORMAL LOW (ref 0.7–4.0)
MCH: 30.2 pg (ref 26.0–34.0)
MCHC: 32.8 g/dL (ref 30.0–36.0)
MCV: 92.2 fL (ref 80.0–100.0)
Monocytes Absolute: 0.4 10*3/uL (ref 0.1–1.0)
Monocytes Relative: 8 %
Neutro Abs: 3.6 10*3/uL (ref 1.7–7.7)
Neutrophils Relative %: 84 %
Platelet Count: 73 10*3/uL — ABNORMAL LOW (ref 150–400)
RBC: 2.81 MIL/uL — ABNORMAL LOW (ref 3.87–5.11)
RDW: 16.4 % — ABNORMAL HIGH (ref 11.5–15.5)
WBC Count: 4.4 10*3/uL (ref 4.0–10.5)
nRBC: 0 % (ref 0.0–0.2)

## 2023-06-29 LAB — MAGNESIUM: Magnesium: 1.4 mg/dL — ABNORMAL LOW (ref 1.7–2.4)

## 2023-06-29 MED ORDER — MAGNESIUM SULFATE 4 GM/100ML IV SOLN
4.0000 g | Freq: Once | INTRAVENOUS | Status: AC
  Administered 2023-06-29: 4 g via INTRAVENOUS
  Filled 2023-06-29: qty 100

## 2023-06-29 MED ORDER — OXYCODONE HCL 5 MG PO TABS
5.0000 mg | ORAL_TABLET | Freq: Once | ORAL | Status: AC
  Administered 2023-06-29: 5 mg via ORAL
  Filled 2023-06-29: qty 1

## 2023-06-29 MED ORDER — SODIUM CHLORIDE 0.9% FLUSH
10.0000 mL | Freq: Once | INTRAVENOUS | Status: DC | PRN
  Filled 2023-06-29: qty 10

## 2023-06-29 MED ORDER — SODIUM CHLORIDE 0.9 % IV SOLN
INTRAVENOUS | Status: DC
  Filled 2023-06-29: qty 250

## 2023-06-29 MED ORDER — HEPARIN SOD (PORK) LOCK FLUSH 100 UNIT/ML IV SOLN
500.0000 [IU] | Freq: Once | INTRAVENOUS | Status: AC | PRN
  Administered 2023-06-29: 500 [IU]
  Filled 2023-06-29: qty 5

## 2023-06-29 MED FILL — Fosaprepitant Dimeglumine For IV Infusion 150 MG (Base Eq): INTRAVENOUS | Qty: 5 | Status: AC

## 2023-06-29 NOTE — Progress Notes (Signed)
 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  INTERVAL HISTORY: Patient returns to clinic today for further evaluation and consideration of cycle 4 of weekly cisplatin .  XRT has been on hold this week secondary to increased edema.  Patient continues to have pain, but this is well-controlled on her current narcotic regimen.  She otherwise feels well.  She continues to have chronic weakness and fatigue. She has no neurologic complaints.  She denies any recent fevers or illnesses. She has no chest pain, shortness of breath, cough, or hemoptysis.  She denies any nausea, vomiting, constipation, or diarrhea.  She has no melena or hematochezia.  She has no urinary complaints.  Patient offers no further specific complaints today.  REVIEW OF SYSTEMS:   Review of Systems  Constitutional:  Positive for malaise/fatigue. Negative for fever and weight loss.  HENT:  Positive for sore throat.   Respiratory: Negative.  Negative for cough, hemoptysis and shortness of breath.   Cardiovascular: Negative.  Negative for chest pain and leg swelling.  Gastrointestinal: Negative.  Negative for abdominal pain, blood in stool, diarrhea and melena.  Genitourinary: Negative.  Negative for dysuria.  Musculoskeletal: Negative.  Negative for back pain.  Skin: Negative.  Negative for rash.  Neurological:  Positive for weakness. Negative for dizziness, focal weakness and headaches.   Psychiatric/Behavioral: Negative.  The patient is not nervous/anxious.    As per HPI. Otherwise, a complete review of systems is negative.  PAST MEDICAL HISTORY: Past Medical History:  Diagnosis Date   Anemia    Diabetes mellitus without complication (HCC)    Gout    Hyperlipidemia    Hypertension    Wears dentures    full upper and lower    PAST SURGICAL HISTORY: Past Surgical History:  Procedure Laterality Date   APPENDECTOMY     gallstones  06/12/2009   MICROLARYNGOSCOPY Right 01/28/2023   Procedure: MICRODIRECT LARYNGOSCOPY WITH BIOPSY OF OROPHARYNGEAL MASS;  Surgeon: Lesly Raspberry, MD;  Location: [facility];  Service: ENT;  Laterality: Right;  Diabetic   PORTACATH PLACEMENT N/A 02/18/2023   Procedure: INSERTION PORT-A-CATH;  Surgeon: Flynn Hylan, MD;  Location: [facility];  Service: General;  Laterality: N/A;   TONSILLECTOMY      FAMILY HISTORY: Family History  Problem Relation Age of Onset   Breast cancer Neg Hx     ADVANCED DIRECTIVES (Y/N):  N  HEALTH MAINTENANCE: Social History   Tobacco Use   Smoking status: Former    Current packs/day: 0.00    Average packs/day: 0.5 packs/day for 8.0 years (4.0 ttl pk-yrs)    Types: Cigarettes    Start date: 95    Quit date: 1990    Years since quitting: 35.4    Passive exposure: Past   Smokeless tobacco: Never  Vaping Use   Vaping status: Never Used  Substance Use Topics   Alcohol use: No    Alcohol/week: 0.0 standard drinks of alcohol   Drug use: No     Colonoscopy:  PAP:  Bone density:  Lipid  panel:  No Known Allergies  Current Outpatient Medications  Medication Sig Dispense Refill   Accu-Chek Softclix Lancets lancets      amLODipine (NORVASC) 10 MG tablet Take 10 mg by mouth daily.     aspirin 81 MG tablet Take 1 tablet by mouth daily.     carvedilol  (COREG ) 3.125 MG tablet Take 1 tablet by mouth 2 (two) times daily. cardio     fluconazole  (DIFLUCAN ) 100 MG tablet Take two tablets  x 1 day (200mg ), then take 1 tablet daily until completed 14 tablet 0   glucose blood (ACCU-CHEK AVIVA PLUS) test strip USE TWICE DAILY AS DIRECTED     hydrochlorothiazide  (MICROZIDE ) 12.5 MG capsule Take 12.5 mg by mouth.     lisinopril  (ZESTRIL ) 5 MG tablet Take 5 mg by mouth.     magic mouthwash (multi-ingredient) oral suspension Swish and swallow 5-10 mLs 4 (four) times daily as needed. 480 mL 3   metFORMIN  (GLUCOPHAGE ) 500 MG tablet One every morning 90 tablet 1   oxyCODONE  (ROXICODONE ) 5 MG/5ML solution Take 5 mLs (5 mg total) by mouth every 4 (four) hours as needed for severe pain (pain score 7-10). 100 mL 0   capecitabine  (XELODA ) 500 MG tablet Take 2 tablets (1,000 mg total) by mouth 2 (two) times daily after a meal. Take Monday-Friday. Take only on days of radiation. (Patient not taking: Reported on 06/29/2023) 120 tablet 0   dexamethasone  (DECADRON ) 2 MG tablet Take 1 tablet (2 mg total) by mouth 2 (two) times daily with a meal. (Patient not taking: Reported on 06/29/2023) 45 tablet 0   diphenoxylate -atropine  (LOMOTIL ) 2.5-0.025 MG tablet Take 1 tablet by mouth 4 (four) times daily as needed for diarrhea or loose stools. (Patient not taking: Reported on 06/29/2023) 30 tablet 0   sucralfate  (CARAFATE ) 1 g tablet Take 1 tablet (1 g total) by mouth 4 (four) times daily -  with meals and at bedtime. Dissolve tablet in 4 tablespoons of warm water  then swish and swallow 30 minutes before meals. (Patient not taking: Reported on 06/29/2023) 120 tablet 0   tiZANidine  (ZANAFLEX ) 4 MG tablet Take 1 tablet (4 mg total) by mouth at bedtime. (Patient not taking: Reported on 06/17/2023) 90 tablet 1   No current facility-administered medications for this visit.   Facility-Administered Medications Ordered in Other Visits  Medication Dose Route Frequency Provider Last Rate Last Admin   0.9 %  sodium chloride  infusion   Intravenous Continuous Maeci Kalbfleisch J, MD   Stopped at 05/13/23 1535   morphine  (PF) 2  MG/ML injection             OBJECTIVE: Vitals:   06/29/23 0837  BP: (!) 123/53  Pulse: 69  Resp: 18  Temp: 98 F (36.7 C)  SpO2: 100%       Body mass index is 21.89 kg/m.    ECOG FS:1 - Symptomatic but completely ambulatory  General: Well-developed, well-nourished, no acute distress.  Sitting in wheelchair. Eyes: Pink conjunctiva, anicteric sclera. HEENT: Normocephalic, moist mucous membranes. Lungs: No audible wheezing or coughing. Heart: Regular rate and rhythm. Abdomen: Soft, nontender, no obvious distention. Musculoskeletal: 2+ lower extremity edema. Neuro: Alert, answering all questions appropriately. Cranial nerves grossly intact. Skin: No rashes or petechiae noted. Psych: Normal affect.  LAB RESULTS:  Lab Results  Component Value Date   NA 136 06/29/2023   K 4.2 06/29/2023   CL 102 06/29/2023   CO2 26 06/29/2023   GLUCOSE 144 (H) 06/29/2023   BUN  33 (H) 06/29/2023   CREATININE 1.22 (H) 06/29/2023   CALCIUM 8.3 (L) 06/29/2023   PROT 6.0 (L) 06/29/2023   ALBUMIN 3.0 (L) 06/29/2023   AST 18 06/29/2023   ALT 11 06/29/2023   ALKPHOS 51 06/29/2023   BILITOT 0.7 06/29/2023   GFRNONAA 44 (L) 06/29/2023   GFRAA 59 (L) 09/24/2019    Lab Results  Component Value Date   WBC 4.4 06/29/2023   NEUTROABS 3.6 06/29/2023   HGB 8.5 (L) 06/29/2023   HCT 25.9 (L) 06/29/2023   MCV 92.2 06/29/2023   PLT 73 (L) 06/29/2023     STUDIES: No results found.  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  Patient has now completed concurrent XRT along with chemotherapy.  Her last dose of mitomycin  was on March 07, 2023.  She finished Xeloda  and XRT on March 25, 2023.   Repeat PET scan results from April 07, 2023 reviewed independently with persistent hypermetabolism at the site of her anal cancer, but PET scan was done sooner than required given her head and neck cancer and this may be residual inflammation/irritation from XRT.  Appreciate surgical input. 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  Initial plan was to give concurrent XRT along with weekly cisplatin .  Patient's blood count is 73 today, but will hold cisplatin  since XRT is also on hold.  Patient received cycle 3 of cisplatin  on Jun 22, 2023.  Return to clinic in 1 week for further evaluation and reconsideration of cycle 4.   Thyroid  nodule with hypermetabolism: Defer ultrasound and biopsy until completion of treatment of 2 malignancies above. Anemia: Chronic and unchanged.  Patient's hemoglobin is 8.5. Leukopenia: Resolved.   Thrombocytopenia: Platelet count decreased slightly to 73.  Hold treatment as above. Hypomagnesia: Chronic and unchanged.  Patient will receive 4 g IV magnesium  today. Hypokalemia: Resolved.   Renal insufficiency: Chronic and unchanged.  Patient's GFR is mildly improved to 44.   Dysphagia: XRT currently on hold.  Continue oxycodone  and Magic mouthwash as needed.   Patient expressed understanding and was in agreement with this plan. She also understands that She can call clinic at any time with any questions, concerns, or complaints.    Cancer Staging  Anal squamous cell carcinoma (HCC) Staging form: Anus, AJCC V9 - Clinical stage from 01/18/2023: Stage IIIA (cT3, cN0, cM0) - Signed by Shellie Dials, MD on 01/18/2023 Stage prefix: Initial diagnosis  Right tonsillar squamous cell carcinoma (HCC) Staging form: Pharynx - P16 Negative Oropharynx, AJCC 8th Edition - Clinical stage from 04/12/2023: Stage III (cT3, cN1, cM0, p16: Unknown) - Signed by Shellie Dials, MD on 04/12/2023 Stage prefix: Initial diagnosis   Shellie Dials, MD   06/29/2023 9:23 AM

## 2023-06-29 NOTE — Progress Notes (Signed)
 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  Patient has completed treatment for anus cancer.  Now receiving cisplatin  and radiation for head and neck cancer.  Radiation currently on hold due to neck edema.  Met with patient during infusion, no chemo today  (Fluids and magnesium )Reports that her appetite is good. Eating peanut butter crackers during visit.  Says that she ate spaghetti last night, salad and ice cream.  For lunch yesterday ate fried chicken, mashed potatoes and macaroni and cheese.  Says that she is able to swallow foods.  Noted treated for thrush.        Medications: fluconazole   Labs: reviewed  Anthropometrics:   Weight 135 lb 9.6 oz today (bilateral edema noted)  MD aware 134 lb 9.6 oz on 5/14 127 lb 9.6 oz on 5/2 114 lb on 4/18 122 lb on 4/14 121 lb on 4/11   NUTRITION DIAGNOSIS: Unintentional weight loss stable (fluid contributing to weight gain)   INTERVENTION:  Continue to push calories and protein     MONITORING, EVALUATION, GOAL: weight trends, intake   NEXT VISIT: Wednesday, May 28 during infusion  Ryley Bachtel B. Zollie Hipp, CSO, LDN Registered Dietitian [phone]

## 2023-06-29 NOTE — Patient Instructions (Signed)
 Magnesium Sulfate Injection What is this medication? MAGNESIUM SULFATE (mag NEE zee um SUL fate) prevents and treats low levels of magnesium in your body. It may also be used to prevent and treat seizures during pregnancy in people with high blood pressure disorders, such as preeclampsia or eclampsia. Magnesium plays an important role in maintaining the health of your muscles and nervous system. This medicine may be used for other purposes; ask your health care provider or pharmacist if you have questions. What should I tell my care team before I take this medication? They need to know if you have any of these conditions: Heart disease History of irregular heart beat Kidney disease An unusual or allergic reaction to magnesium sulfate, medications, foods, dyes, or preservatives Pregnant or trying to get pregnant Breast-feeding How should I use this medication? This medication is for infusion into a vein. It is given in a hospital or clinic setting. Talk to your care team about the use of this medication in children. While this medication may be prescribed for selected conditions, precautions do apply. Overdosage: If you think you have taken too much of this medicine contact a poison control center or emergency room at once. NOTE: This medicine is only for you. Do not share this medicine with others. What if I miss a dose? This does not apply. What may interact with this medication? Certain medications for anxiety or sleep Certain medications for seizures, such phenobarbital Digoxin Medications that relax muscles for surgery Narcotic medications for pain This list may not describe all possible interactions. Give your health care provider a list of all the medicines, herbs, non-prescription drugs, or dietary supplements you use. Also tell them if you smoke, drink alcohol, or use illegal drugs. Some items may interact with your medicine. What should I watch for while using this  medication? Your condition will be monitored carefully while you are receiving this medication. You may need blood work done while you are receiving this medication. What side effects may I notice from receiving this medication? Side effects that you should report to your care team as soon as possible: Allergic reactions--skin rash, itching, hives, swelling of the face, lips, tongue, or throat High magnesium level--confusion, drowsiness, facial flushing, redness, sweating, muscle weakness, fast or irregular heartbeat, trouble breathing Low blood pressure--dizziness, feeling faint or lightheaded, blurry vision Side effects that usually do not require medical attention (report to your care team if they continue or are bothersome): Headache Nausea This list may not describe all possible side effects. Call your doctor for medical advice about side effects. You may report side effects to FDA at 1-800-FDA-1088. Where should I keep my medication? This medication is given in a hospital or clinic and will not be stored at home. NOTE: This sheet is a summary. It may not cover all possible information. If you have questions about this medicine, talk to your doctor, pharmacist, or health care provider.  2024 Elsevier/Gold Standard (2020-10-08 00:00:00)

## 2023-06-30 ENCOUNTER — Ambulatory Visit

## 2023-06-30 ENCOUNTER — Other Ambulatory Visit: Payer: Self-pay

## 2023-07-01 ENCOUNTER — Ambulatory Visit

## 2023-07-01 ENCOUNTER — Other Ambulatory Visit

## 2023-07-01 ENCOUNTER — Ambulatory Visit: Admitting: Oncology

## 2023-07-01 ENCOUNTER — Telehealth: Payer: Self-pay | Admitting: *Deleted

## 2023-07-01 NOTE — Telephone Encounter (Signed)
 Antonio the son he was saying does she have a liquid oxycodone  and I said yes it is in the computer that she has 1, then he tells me that she also has oxycodone  oral pill.  Son says that she is fallen twice and sometimes she is not thinking straight.  He feels like that she has been taking both the liquid and the tablet.  She should be only on 1 medicine for pain.  I also said that we can make an appointment for her if she continues following.  I told the son to ask the patient if she wants to take the liquid pain medicine or the pill pain medicine.  Whichever 1 she wants and let her have that , sure the other oxycodone  is put up somewhere so she does not get confused and take both of them again.  He says that the great idea, he will let her mother know

## 2023-07-03 ENCOUNTER — Other Ambulatory Visit: Payer: Self-pay

## 2023-07-05 ENCOUNTER — Ambulatory Visit

## 2023-07-05 MED FILL — Fosaprepitant Dimeglumine For IV Infusion 150 MG (Base Eq): INTRAVENOUS | Qty: 5 | Status: AC

## 2023-07-06 ENCOUNTER — Ambulatory Visit

## 2023-07-06 ENCOUNTER — Inpatient Hospital Stay

## 2023-07-06 ENCOUNTER — Inpatient Hospital Stay: Admitting: Oncology

## 2023-07-06 ENCOUNTER — Ambulatory Visit: Admitting: Oncology

## 2023-07-06 ENCOUNTER — Other Ambulatory Visit

## 2023-07-07 ENCOUNTER — Ambulatory Visit

## 2023-07-07 ENCOUNTER — Telehealth: Payer: Self-pay | Admitting: *Deleted

## 2023-07-07 ENCOUNTER — Ambulatory Visit
Admission: RE | Admit: 2023-07-07 | Discharge: 2023-07-07 | Disposition: A | Discharge status: Home / Self Care | Source: Ambulatory Visit | Attending: Radiation Oncology | Admitting: Radiation Oncology

## 2023-07-07 ENCOUNTER — Ambulatory Visit: Admission: RE | Admit: 2023-07-07 | Source: Ambulatory Visit

## 2023-07-07 DIAGNOSIS — Z510 Encounter for antineoplastic radiation therapy: Secondary | ICD-10-CM | POA: Diagnosis not present

## 2023-07-07 NOTE — Transitions of Care (Post Inpatient/ED Visit) (Signed)
   07/07/2023  Name: April [name] MRN: [id number] DOB: 02-Feb-1941  Today's TOC FU Call Status: Today's TOC FU Call Status:: Unsuccessful Call (1st Attempt) Unsuccessful Call (1st Attempt) Date: 07/07/23  Attempted to reach the patient regarding the most recent Inpatient visit.  Left HIPAA compliant voice message requesting call back   Follow Up Plan: Additional outreach attempts will be made to reach the patient to complete the Transitions of Care (Post Inpatient visit) call.   Pls call/ message for questions,  Keilyn Haggard Mckinney Casady Voshell, RN, BSN, CCRN Alumnus RN Care Manager  Transitions of Care  [facility] - [facility] [phone]: direct office

## 2023-07-08 ENCOUNTER — Ambulatory Visit

## 2023-07-08 ENCOUNTER — Telehealth: Payer: Self-pay | Admitting: *Deleted

## 2023-07-08 ENCOUNTER — Telehealth: Payer: Self-pay

## 2023-07-08 ENCOUNTER — Other Ambulatory Visit

## 2023-07-08 ENCOUNTER — Ambulatory Visit: Admitting: Oncology

## 2023-07-08 NOTE — Telephone Encounter (Signed)
 Spoke with April [name] and informed her Dr. Rochelle Chu is retiring today and she will not be taken future order for patient. She is going to be under Dr. Spence Dux for future care.   JM  Copied from CRM [id number]. Topic: Clinical - Medical Advice >> Jul 07, 2023  2:48 PM Rennis Case wrote: Reason for CRM: April [name] w/ [facility] April [name] at [facility]. Pt being seen for acute cystitis w/ out hematuria. Hospital ordered home health for patient. April [name] inquiring if provider would continue to follow up for patient and if provider will sign any future orders.    Requesting call back,  [phone]

## 2023-07-08 NOTE — Transitions of Care (Post Inpatient/ED Visit) (Signed)
   07/08/2023  Name: April [name] MRN: [id number] DOB: 12-13-1940  Today's TOC FU Call Status: Today's TOC FU Call Status:: Unsuccessful Call (2nd Attempt) Unsuccessful Call (2nd Attempt) Date: 07/08/23  Attempted to reach the patient regarding the most recent Inpatient visit.  Call attempt to [phone]: Person answering phone reports he is her son- not verified on [facility] DPR: he advises me to call [phone]   Attempted call to patient, as advised by her son- like yesterday attempt-- again received automated outgoing message stating that "the call cannot be completed at this time;" unable to leave voice message requesting call back   Follow Up Plan: Additional outreach attempts will be made to reach the patient to complete the Transitions of Care (Post Inpatient visit) call.   Pls call/ message for questions,  Quaran Kedzierski Mckinney Molli Gethers, RN, BSN, CCRN Alumnus RN Care Manager  Transitions of Care  [facility] - [facility] [phone]: direct office

## 2023-07-11 ENCOUNTER — Ambulatory Visit: Attending: Radiation Oncology

## 2023-07-11 ENCOUNTER — Telehealth: Payer: Self-pay | Admitting: *Deleted

## 2023-07-11 ENCOUNTER — Ambulatory Visit

## 2023-07-11 ENCOUNTER — Other Ambulatory Visit: Payer: Self-pay | Admitting: Family Medicine

## 2023-07-11 DIAGNOSIS — R6884 Jaw pain: Secondary | ICD-10-CM | POA: Insufficient documentation

## 2023-07-11 DIAGNOSIS — Z510 Encounter for antineoplastic radiation therapy: Secondary | ICD-10-CM | POA: Diagnosis present

## 2023-07-11 DIAGNOSIS — M109 Gout, unspecified: Secondary | ICD-10-CM | POA: Diagnosis not present

## 2023-07-11 DIAGNOSIS — E041 Nontoxic single thyroid nodule: Secondary | ICD-10-CM | POA: Insufficient documentation

## 2023-07-11 DIAGNOSIS — Z7901 Long term (current) use of anticoagulants: Secondary | ICD-10-CM | POA: Insufficient documentation

## 2023-07-11 DIAGNOSIS — C109 Malignant neoplasm of oropharynx, unspecified: Secondary | ICD-10-CM | POA: Insufficient documentation

## 2023-07-11 DIAGNOSIS — Z87891 Personal history of nicotine dependence: Secondary | ICD-10-CM | POA: Insufficient documentation

## 2023-07-11 DIAGNOSIS — Z791 Long term (current) use of non-steroidal anti-inflammatories (NSAID): Secondary | ICD-10-CM | POA: Insufficient documentation

## 2023-07-11 DIAGNOSIS — Z923 Personal history of irradiation: Secondary | ICD-10-CM | POA: Diagnosis not present

## 2023-07-11 DIAGNOSIS — Z79899 Other long term (current) drug therapy: Secondary | ICD-10-CM | POA: Insufficient documentation

## 2023-07-11 DIAGNOSIS — Z803 Family history of malignant neoplasm of breast: Secondary | ICD-10-CM | POA: Insufficient documentation

## 2023-07-11 DIAGNOSIS — Z7982 Long term (current) use of aspirin: Secondary | ICD-10-CM | POA: Insufficient documentation

## 2023-07-11 DIAGNOSIS — Z7984 Long term (current) use of oral hypoglycemic drugs: Secondary | ICD-10-CM | POA: Diagnosis not present

## 2023-07-11 DIAGNOSIS — R197 Diarrhea, unspecified: Secondary | ICD-10-CM | POA: Insufficient documentation

## 2023-07-11 DIAGNOSIS — D696 Thrombocytopenia, unspecified: Secondary | ICD-10-CM | POA: Insufficient documentation

## 2023-07-11 DIAGNOSIS — N289 Disorder of kidney and ureter, unspecified: Secondary | ICD-10-CM | POA: Insufficient documentation

## 2023-07-11 DIAGNOSIS — D72819 Decreased white blood cell count, unspecified: Secondary | ICD-10-CM | POA: Insufficient documentation

## 2023-07-11 DIAGNOSIS — E876 Hypokalemia: Secondary | ICD-10-CM | POA: Insufficient documentation

## 2023-07-11 DIAGNOSIS — I7090 Unspecified atherosclerosis: Secondary | ICD-10-CM | POA: Insufficient documentation

## 2023-07-11 DIAGNOSIS — E119 Type 2 diabetes mellitus without complications: Secondary | ICD-10-CM | POA: Diagnosis not present

## 2023-07-11 DIAGNOSIS — R410 Disorientation, unspecified: Secondary | ICD-10-CM | POA: Insufficient documentation

## 2023-07-11 DIAGNOSIS — I119 Hypertensive heart disease without heart failure: Secondary | ICD-10-CM | POA: Insufficient documentation

## 2023-07-11 DIAGNOSIS — C099 Malignant neoplasm of tonsil, unspecified: Secondary | ICD-10-CM | POA: Insufficient documentation

## 2023-07-11 DIAGNOSIS — C20 Malignant neoplasm of rectum: Secondary | ICD-10-CM | POA: Diagnosis not present

## 2023-07-11 DIAGNOSIS — C210 Malignant neoplasm of anus, unspecified: Secondary | ICD-10-CM | POA: Insufficient documentation

## 2023-07-11 DIAGNOSIS — R600 Localized edema: Secondary | ICD-10-CM | POA: Insufficient documentation

## 2023-07-11 DIAGNOSIS — I10 Essential (primary) hypertension: Secondary | ICD-10-CM

## 2023-07-11 DIAGNOSIS — D649 Anemia, unspecified: Secondary | ICD-10-CM | POA: Diagnosis not present

## 2023-07-11 DIAGNOSIS — E785 Hyperlipidemia, unspecified: Secondary | ICD-10-CM | POA: Diagnosis not present

## 2023-07-11 DIAGNOSIS — E042 Nontoxic multinodular goiter: Secondary | ICD-10-CM | POA: Insufficient documentation

## 2023-07-11 DIAGNOSIS — R1310 Dysphagia, unspecified: Secondary | ICD-10-CM | POA: Diagnosis not present

## 2023-07-11 DIAGNOSIS — I6782 Cerebral ischemia: Secondary | ICD-10-CM | POA: Insufficient documentation

## 2023-07-11 DIAGNOSIS — I700 Atherosclerosis of aorta: Secondary | ICD-10-CM | POA: Diagnosis not present

## 2023-07-11 DIAGNOSIS — Z5111 Encounter for antineoplastic chemotherapy: Secondary | ICD-10-CM | POA: Diagnosis not present

## 2023-07-11 DIAGNOSIS — R5383 Other fatigue: Secondary | ICD-10-CM | POA: Insufficient documentation

## 2023-07-11 DIAGNOSIS — N2889 Other specified disorders of kidney and ureter: Secondary | ICD-10-CM | POA: Insufficient documentation

## 2023-07-11 DIAGNOSIS — Z7952 Long term (current) use of systemic steroids: Secondary | ICD-10-CM | POA: Insufficient documentation

## 2023-07-11 NOTE — Transitions of Care (Post Inpatient/ED Visit) (Signed)
 07/11/2023  Name: April [name] MRN: [id number] DOB: [birth year]  Today's TOC FU Call Status: Today's TOC FU Call Status:: Successful TOC FU Call Completed TOC FU Call Complete Date: 07/11/23 Patient's Name and Date of Birth confirmed.  Transition Care Management Follow-up Telephone Call Date of Discharge: 07/05/23 Discharge Facility: Other [facility]) Name of Other (Non[facility]) Discharge Facility: [facility] Type of Discharge: Inpatient Admission Primary Inpatient Discharge Diagnosis:: Acute cystitis with delerium; mechanical ground level fall, bilateral LE edema How have you been since you were released from the hospital?: Better ("I am doing fine no problems; I am not going to [facility] anymore, I have a new PCP [facility]") Any questions or concerns?: No  Cpnfirmed with patient that she is no longer using [facility] for primary care; now has non-[facility] PCP: encouraged her to ensure prompt hospital follow up with her current non-[facility] PCP; modified/ partial TOC completed accordingly  Items Reviewed: Did you receive and understand the discharge instructions provided?: Yes Medications obtained,verified, and reconciled?: No Medications Not Reviewed Reasons:: Other: (confirmed non-[facility] PCP: partial TOC call only) Any new allergies since your discharge?: No Dietary orders reviewed?: No  Medications Reviewed Today: Medications Reviewed Today     Reviewed by Lorely Bubb M, RN (Registered Nurse) on 07/11/23 at 0901  Med List Status: <None>   Medication Order Taking? Sig Documenting Provider Last Dose Status Informant  Accu-Chek Softclix Lancets lancets [id number] No  [provider] Taking Active   amLODipine (NORVASC) 10 MG tablet [id number] No Take 10 mg by mouth daily. [provider] Taking Active   aspirin 81 MG tablet [id number] No Take 1 tablet by mouth daily. [provider] Taking Active            Med Note  Wendolyn Hamburger, Aminta Kales D   Wed Jul 04, 2019  9:29 AM)    capecitabine  (XELODA ) 500 MG tablet [id number] No Take 2 tablets (1,000 mg total) by mouth 2 (two) times daily after a meal. Take Monday-Friday. Take only on days of radiation.  Patient not taking: Reported on 06/29/2023   Shellie Dials, MD Not Taking Active   carvedilol  (COREG ) 3.125 MG tablet [id number] No Take 1 tablet by mouth 2 (two) times daily. cardio [provider] Taking Active   dexamethasone  (DECADRON ) 2 MG tablet [id number] No Take 1 tablet (2 mg total) by mouth 2 (two) times daily with a meal.  Patient not taking: Reported on 06/29/2023   Glenis Langdon, MD Not Taking Active   diphenoxylate -atropine  (LOMOTIL ) 2.5-0.025 MG tablet [id number] No Take 1 tablet by mouth 4 (four) times daily as needed for diarrhea or loose stools.  Patient not taking: Reported on 06/29/2023   Shellie Dials, MD Not Taking Active   fluconazole  (DIFLUCAN ) 100 MG tablet [id number] No Take two tablets x 1 day (200mg ), then take 1 tablet daily until completed Borders, Carlene Che, NP Taking Active   glucose blood (ACCU-CHEK AVIVA PLUS) test strip [id number] No USE TWICE DAILY AS DIRECTED [provider] Taking Active   hydrochlorothiazide  (MICROZIDE ) 12.5 MG capsule [id number] No Take 12.5 mg by mouth. [provider] Taking Active   lisinopril  (ZESTRIL ) 5 MG tablet [id number] No Take 5 mg by mouth. [provider] Taking Active   magic mouthwash (multi-ingredient) oral suspension [id number] No Swish and swallow 5-10 mLs 4 (four) times daily as needed. Borders, Carlene Che, NP Taking Active   metFORMIN  (GLUCOPHAGE ) 500 MG tablet [id number] No One every morning  Clarise Crooks, MD Taking Active   oxyCODONE  (ROXICODONE ) 5 MG/5ML solution [id number] No Take 5 mLs (5 mg total) by mouth every 4 (four) hours as needed for severe pain (pain score 7-10). Shellie Dials, MD Taking Active   sucralfate  (CARAFATE ) 1 g tablet [id number] No Take 1  tablet (1 g total) by mouth 4 (four) times daily -  with meals and at bedtime. Dissolve tablet in 4 tablespoons of warm water  then swish and swallow 30 minutes before meals.  Patient not taking: Reported on 06/29/2023   Glenis Langdon, MD Not Taking Expired 06/22/23 2359   tiZANidine  (ZANAFLEX ) 4 MG tablet [id number] No Take 1 tablet (4 mg total) by mouth at bedtime.  Patient not taking: Reported on 06/17/2023   Clarise Crooks, MD Not Taking Active Self            Home Care and Equipment/Supplies:    Functional Questionnaire:    Follow up appointments reviewed:    See TOC assessment tabs for additional assessment/ TOC intervention information  Pls call/ message for questions,  Wilberth Damon Mckinney Equilla Que, RN, BSN, CCRN Alumnus RN Care Manager  Transitions of Care  [facility] - [facility] [phone]: direct office

## 2023-07-12 ENCOUNTER — Ambulatory Visit

## 2023-07-12 NOTE — Telephone Encounter (Signed)
 Requested medication (s) are due for refill today: no  Requested medication (s) are on the active medication list: no  Last refill:  06/07/23  Future visit scheduled: no  Notes to clinic:  pt of Dr. Rochelle Chu needs cosigner   Requested Prescriptions  Pending Prescriptions Disp Refills   lisinopril  (ZESTRIL ) 20 MG tablet [Pharmacy Med Name: LISINOPRIL  20MG  TABLETS] 30 tablet 0    Sig: TAKE 1 TABLET(20 MG) BY MOUTH DAILY     Cardiovascular:  ACE Inhibitors Failed - 07/12/2023  2:28 PM      Failed - Cr in normal range and within 180 days    Creatinine  Date Value Ref Range Status  06/29/2023 1.22 (H) 0.44 - 1.00 mg/dL Final   Creatinine, Urine  Date Value Ref Range Status  07/24/2021 40  Final         Failed - Last BP in normal range    BP Readings from Last 1 Encounters:  06/29/23 (!) 115/41         Passed - K in normal range and within 180 days    Potassium  Date Value Ref Range Status  06/29/2023 4.2 3.5 - 5.1 mmol/L Final         Passed - Patient is not pregnant      Passed - Valid encounter within last 6 months    Recent Outpatient Visits           3 weeks ago Encounter for Department of Transportation (DOT) examination for driving license renewal   [facility], Rankin Buzzard, NP   1 month ago Essential hypertension   [facility], [facility]   1 month ago Essential hypertension   [place] [facility], [facility]   2 months ago Essential hypertension   [place] [facility], MD

## 2023-07-13 ENCOUNTER — Ambulatory Visit

## 2023-07-13 ENCOUNTER — Encounter: Payer: Self-pay | Admitting: Oncology

## 2023-07-13 ENCOUNTER — Inpatient Hospital Stay: Admitting: Oncology

## 2023-07-13 ENCOUNTER — Ambulatory Visit
Admission: RE | Admit: 2023-07-13 | Discharge: 2023-07-13 | Disposition: A | Discharge status: Home / Self Care | Source: Ambulatory Visit | Attending: Oncology | Admitting: Oncology

## 2023-07-13 ENCOUNTER — Encounter: Payer: Self-pay | Admitting: *Deleted

## 2023-07-13 ENCOUNTER — Ambulatory Visit: Admitting: Oncology

## 2023-07-13 ENCOUNTER — Other Ambulatory Visit: Payer: Self-pay | Admitting: *Deleted

## 2023-07-13 ENCOUNTER — Inpatient Hospital Stay

## 2023-07-13 ENCOUNTER — Other Ambulatory Visit

## 2023-07-13 VITALS — BP 115/63 | HR 67 | Temp 96.8°F | Resp 15

## 2023-07-13 VITALS — BP 116/50 | HR 61 | Temp 95.0°F | Resp 17

## 2023-07-13 DIAGNOSIS — I7090 Unspecified atherosclerosis: Secondary | ICD-10-CM | POA: Insufficient documentation

## 2023-07-13 DIAGNOSIS — R519 Headache, unspecified: Secondary | ICD-10-CM | POA: Insufficient documentation

## 2023-07-13 DIAGNOSIS — I6782 Cerebral ischemia: Secondary | ICD-10-CM | POA: Insufficient documentation

## 2023-07-13 DIAGNOSIS — R1310 Dysphagia, unspecified: Secondary | ICD-10-CM | POA: Insufficient documentation

## 2023-07-13 DIAGNOSIS — Z7982 Long term (current) use of aspirin: Secondary | ICD-10-CM | POA: Insufficient documentation

## 2023-07-13 DIAGNOSIS — Z5111 Encounter for antineoplastic chemotherapy: Secondary | ICD-10-CM | POA: Insufficient documentation

## 2023-07-13 DIAGNOSIS — Z87891 Personal history of nicotine dependence: Secondary | ICD-10-CM | POA: Insufficient documentation

## 2023-07-13 DIAGNOSIS — M109 Gout, unspecified: Secondary | ICD-10-CM | POA: Insufficient documentation

## 2023-07-13 DIAGNOSIS — C210 Malignant neoplasm of anus, unspecified: Secondary | ICD-10-CM | POA: Insufficient documentation

## 2023-07-13 DIAGNOSIS — I10 Essential (primary) hypertension: Secondary | ICD-10-CM | POA: Insufficient documentation

## 2023-07-13 DIAGNOSIS — C099 Malignant neoplasm of tonsil, unspecified: Secondary | ICD-10-CM | POA: Insufficient documentation

## 2023-07-13 DIAGNOSIS — Z803 Family history of malignant neoplasm of breast: Secondary | ICD-10-CM | POA: Insufficient documentation

## 2023-07-13 DIAGNOSIS — D649 Anemia, unspecified: Secondary | ICD-10-CM | POA: Insufficient documentation

## 2023-07-13 DIAGNOSIS — N2889 Other specified disorders of kidney and ureter: Secondary | ICD-10-CM | POA: Insufficient documentation

## 2023-07-13 DIAGNOSIS — D72819 Decreased white blood cell count, unspecified: Secondary | ICD-10-CM | POA: Insufficient documentation

## 2023-07-13 DIAGNOSIS — R600 Localized edema: Secondary | ICD-10-CM | POA: Insufficient documentation

## 2023-07-13 DIAGNOSIS — E119 Type 2 diabetes mellitus without complications: Secondary | ICD-10-CM | POA: Insufficient documentation

## 2023-07-13 DIAGNOSIS — E041 Nontoxic single thyroid nodule: Secondary | ICD-10-CM | POA: Insufficient documentation

## 2023-07-13 DIAGNOSIS — Z7901 Long term (current) use of anticoagulants: Secondary | ICD-10-CM | POA: Insufficient documentation

## 2023-07-13 DIAGNOSIS — Z7952 Long term (current) use of systemic steroids: Secondary | ICD-10-CM | POA: Insufficient documentation

## 2023-07-13 DIAGNOSIS — E785 Hyperlipidemia, unspecified: Secondary | ICD-10-CM | POA: Insufficient documentation

## 2023-07-13 DIAGNOSIS — D696 Thrombocytopenia, unspecified: Secondary | ICD-10-CM | POA: Insufficient documentation

## 2023-07-13 DIAGNOSIS — R410 Disorientation, unspecified: Secondary | ICD-10-CM | POA: Insufficient documentation

## 2023-07-13 DIAGNOSIS — Z7984 Long term (current) use of oral hypoglycemic drugs: Secondary | ICD-10-CM | POA: Insufficient documentation

## 2023-07-13 DIAGNOSIS — Z79899 Other long term (current) drug therapy: Secondary | ICD-10-CM | POA: Insufficient documentation

## 2023-07-13 DIAGNOSIS — Z923 Personal history of irradiation: Secondary | ICD-10-CM | POA: Insufficient documentation

## 2023-07-13 DIAGNOSIS — Z510 Encounter for antineoplastic radiation therapy: Secondary | ICD-10-CM | POA: Diagnosis not present

## 2023-07-13 LAB — CBC WITH DIFFERENTIAL (CANCER CENTER ONLY)
Abs Immature Granulocytes: 0.03 10*3/uL (ref 0.00–0.07)
Basophils Absolute: 0 10*3/uL (ref 0.0–0.1)
Basophils Relative: 0 %
Eosinophils Absolute: 0 10*3/uL (ref 0.0–0.5)
Eosinophils Relative: 1 %
HCT: 22.6 % — ABNORMAL LOW (ref 36.0–46.0)
Hemoglobin: 7.3 g/dL — ABNORMAL LOW (ref 12.0–15.0)
Immature Granulocytes: 1 %
Lymphocytes Relative: 12 %
Lymphs Abs: 0.3 10*3/uL — ABNORMAL LOW (ref 0.7–4.0)
MCH: 30.5 pg (ref 26.0–34.0)
MCHC: 32.3 g/dL (ref 30.0–36.0)
MCV: 94.6 fL (ref 80.0–100.0)
Monocytes Absolute: 0.3 10*3/uL (ref 0.1–1.0)
Monocytes Relative: 12 %
Neutro Abs: 1.9 10*3/uL (ref 1.7–7.7)
Neutrophils Relative %: 74 %
Platelet Count: 80 10*3/uL — ABNORMAL LOW (ref 150–400)
RBC: 2.39 MIL/uL — ABNORMAL LOW (ref 3.87–5.11)
RDW: 16.3 % — ABNORMAL HIGH (ref 11.5–15.5)
WBC Count: 2.5 10*3/uL — ABNORMAL LOW (ref 4.0–10.5)
nRBC: 0 % (ref 0.0–0.2)

## 2023-07-13 LAB — BASIC METABOLIC PANEL - CANCER CENTER ONLY
Anion gap: 6 (ref 5–15)
BUN: 27 mg/dL — ABNORMAL HIGH (ref 8–23)
CO2: 25 mmol/L (ref 22–32)
Calcium: 8.7 mg/dL — ABNORMAL LOW (ref 8.9–10.3)
Chloride: 106 mmol/L (ref 98–111)
Creatinine: 1.49 mg/dL — ABNORMAL HIGH (ref 0.44–1.00)
GFR, Estimated: 34 mL/min — ABNORMAL LOW (ref >60)
Glucose, Bld: 111 mg/dL — ABNORMAL HIGH (ref 70–99)
Potassium: 3.5 mmol/L (ref 3.5–5.1)
Sodium: 137 mmol/L (ref 135–145)

## 2023-07-13 LAB — MAGNESIUM: Magnesium: 1.5 mg/dL — ABNORMAL LOW (ref 1.7–2.4)

## 2023-07-13 LAB — PREPARE RBC (CROSSMATCH)

## 2023-07-13 MED ORDER — HEPARIN SOD (PORK) LOCK FLUSH 100 UNIT/ML IV SOLN
500.0000 [IU] | Freq: Once | INTRAVENOUS | Status: AC | PRN
  Administered 2023-07-13: 500 [IU]
  Filled 2023-07-13: qty 5

## 2023-07-13 MED ORDER — MORPHINE SULFATE (PF) 2 MG/ML IV SOLN
2.0000 mg | Freq: Once | INTRAVENOUS | Status: AC
  Administered 2023-07-13: 2 mg via INTRAVENOUS
  Filled 2023-07-13: qty 1

## 2023-07-13 MED ORDER — DEXAMETHASONE SODIUM PHOSPHATE 10 MG/ML IJ SOLN
10.0000 mg | Freq: Once | INTRAMUSCULAR | Status: AC
  Administered 2023-07-13: 10 mg via INTRAVENOUS
  Filled 2023-07-13: qty 1

## 2023-07-13 MED ORDER — PALONOSETRON HCL INJECTION 0.25 MG/5ML
0.2500 mg | Freq: Once | INTRAVENOUS | Status: AC
  Administered 2023-07-13: 0.25 mg via INTRAVENOUS
  Filled 2023-07-13: qty 5

## 2023-07-13 MED ORDER — SODIUM CHLORIDE 0.9 % IV SOLN
150.0000 mg | Freq: Once | INTRAVENOUS | Status: AC
  Administered 2023-07-13: 150 mg via INTRAVENOUS
  Filled 2023-07-13: qty 150

## 2023-07-13 MED ORDER — MAGNESIUM SULFATE 4 GM/100ML IV SOLN
4.0000 g | Freq: Once | INTRAVENOUS | Status: AC
  Administered 2023-07-13: 4 g via INTRAVENOUS
  Filled 2023-07-13: qty 100

## 2023-07-13 MED ORDER — POTASSIUM CHLORIDE IN NACL 20-0.9 MEQ/L-% IV SOLN
Freq: Once | INTRAVENOUS | Status: AC
  Filled 2023-07-13: qty 1000

## 2023-07-13 MED ORDER — SODIUM CHLORIDE 0.9 % IV SOLN
INTRAVENOUS | Status: DC
  Filled 2023-07-13: qty 250

## 2023-07-13 MED ORDER — SODIUM CHLORIDE 0.9 % IV SOLN: 30.0000 mg/m2 | Freq: Once | INTRAVENOUS | Status: DC

## 2023-07-13 MED ORDER — SODIUM CHLORIDE 0.9 % IV SOLN
20.0000 mg/m2 | Freq: Once | INTRAVENOUS | Status: AC
  Administered 2023-07-13: 31 mg via INTRAVENOUS
  Filled 2023-07-13: qty 31

## 2023-07-13 MED FILL — Fosaprepitant Dimeglumine For IV Infusion 150 MG (Base Eq): INTRAVENOUS | Qty: 5 | Status: AC

## 2023-07-13 NOTE — Progress Notes (Signed)
Ct h

## 2023-07-13 NOTE — Patient Instructions (Signed)
 Patient reported fall at home that involved her hitting her head. Fall happened at home this morning. Patient reports 10/10 headache. IV pain medication given in clinic for headache. STAT Head CT ordered by Dr. Adrian Alba. RN called and spoke with patients son, he verified that patient did have fall this morning. RN advised that patient will go for STAT CT after she completes treatment, scheduling is working on scheduling of appointment at this time. RN also requested that son bring all of of patients medication at next visit to be reviewed by provider. Patients son verbalized understanding and is in agreement with plan.

## 2023-07-13 NOTE — Progress Notes (Signed)
 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  INTERVAL HISTORY: Patient returns to clinic today for further evaluation and reconsideration of cycle 4 of weekly cisplatin .  She has admitted to [facility] hospitals recently for confusion, but this is resolved and patient is back to her baseline.  She has also reinitiated her XRT.  She continues to have pain, but otherwise feels well. She continues to have chronic weakness and fatigue. She has no neurologic complaints.  She denies any recent fevers or illnesses. She has no chest pain, shortness of breath, cough, or hemoptysis.  She denies any nausea, vomiting, constipation, or diarrhea.  She has no melena or hematochezia.  She has no urinary complaints.  Patient offers no further specific complaints today.  REVIEW OF SYSTEMS:   Review of Systems  Constitutional:  Positive for malaise/fatigue. Negative for fever and weight loss.  HENT:  Positive for sore throat.   Respiratory: Negative.  Negative for cough, hemoptysis and shortness of breath.   Cardiovascular: Negative.  Negative for chest pain and leg swelling.  Gastrointestinal: Negative.  Negative for abdominal pain, blood in stool, diarrhea and melena.  Genitourinary: Negative.  Negative for dysuria.  Musculoskeletal: Negative.  Negative for back pain.  Skin: Negative.  Negative for rash.  Neurological:  Positive for weakness. Negative for dizziness, focal weakness and  headaches.  Psychiatric/Behavioral: Negative.  The patient is not nervous/anxious.    As per HPI. Otherwise, a complete review of systems is negative.  PAST MEDICAL HISTORY: Past Medical History:  Diagnosis Date   Anemia    Diabetes mellitus without complication (HCC)    Gout    Hyperlipidemia    Hypertension    Wears dentures    full upper and lower    PAST SURGICAL HISTORY: Past Surgical History:  Procedure Laterality Date   APPENDECTOMY     gallstones  06/12/2009   MICROLARYNGOSCOPY Right 01/28/2023   Procedure: MICRODIRECT LARYNGOSCOPY WITH BIOPSY OF OROPHARYNGEAL MASS;  Surgeon: Lesly Raspberry, MD;  Location: [facility];  Service: ENT;  Laterality: Right;  Diabetic   PORTACATH PLACEMENT N/A 02/18/2023   Procedure: INSERTION PORT-A-CATH;  Surgeon: Flynn Hylan, MD;  Location: [facility];  Service: General;  Laterality: N/A;   TONSILLECTOMY      FAMILY HISTORY: Family History  Problem Relation Age of Onset   Breast cancer Neg Hx     ADVANCED DIRECTIVES (Y/N):  N  HEALTH MAINTENANCE: Social History   Tobacco Use   Smoking status: Former    Current packs/day: 0.00    Average packs/day: 0.5 packs/day for 8.0 years (4.0 ttl pk-yrs)    Types: Cigarettes    Start date: 70    Quit date: 1990    Years since quitting: 35.4    Passive exposure: Past   Smokeless tobacco: Never  Vaping Use   Vaping status: Never Used  Substance Use Topics   Alcohol use: No    Alcohol/week: 0.0 standard drinks of alcohol   Drug use: No     Colonoscopy:  PAP:  Bone density:  Lipid panel:  No Known Allergies  Current Outpatient Medications  Medication Sig Dispense Refill   Accu-Chek Softclix Lancets lancets      amLODipine (NORVASC) 10 MG tablet Take 10 mg by mouth daily.     apixaban (ELIQUIS) 2.5 MG TABS tablet Take 2.5 mg by mouth.     carvedilol  (COREG ) 3.125 MG tablet Take 1 tablet by mouth 2 (two) times daily. cardio     cephALEXin (KEFLEX) 500 MG capsule  Take 500 mg by mouth 4 (four) times daily.     colchicine  0.6 MG tablet Take by mouth.     cyanocobalamin  (VITAMIN B12) 1000 MCG tablet Take 1 tablet by mouth daily.     fluconazole  (DIFLUCAN ) 100 MG tablet Take two tablets x 1 day (200mg ), then take 1 tablet daily until completed 14 tablet 0   glucose blood (ACCU-CHEK AVIVA PLUS) test strip USE TWICE DAILY AS DIRECTED     hydrochlorothiazide  (MICROZIDE ) 12.5 MG capsule Take 12.5 mg by mouth.     lisinopril  (ZESTRIL ) 20 MG tablet TAKE 1 TABLET(20 MG) BY MOUTH DAILY 90 tablet 2   magic mouthwash (multi-ingredient) oral suspension Swish and swallow 5-10 mLs 4 (four) times daily as needed. 480 mL 3   metFORMIN  (GLUCOPHAGE ) 500 MG tablet One every morning 90 tablet 1   oxyCODONE  (ROXICODONE ) 5 MG/5ML solution Take 5 mLs (5 mg total) by mouth every 4 (four) hours as needed for severe pain (pain score 7-10). 100 mL 0   aspirin 81 MG tablet Take 1 tablet by mouth daily. (Patient not taking: Reported on 07/13/2023)     capecitabine  (XELODA ) 500 MG tablet Take 2 tablets (1,000 mg total) by mouth 2 (two) times daily after a meal. Take Monday-Friday. Take only on days of radiation. (Patient not taking: Reported on 06/17/2023) 120 tablet 0   dexamethasone  (DECADRON ) 2 MG tablet Take 1 tablet (2 mg total) by mouth 2 (two) times daily with a meal. (Patient not taking: Reported on 06/17/2023) 45 tablet 0   diphenoxylate -atropine  (LOMOTIL ) 2.5-0.025 MG tablet Take 1 tablet by mouth 4 (four) times daily as needed for diarrhea or loose stools. (Patient not taking: Reported on 06/27/2023) 30 tablet 0   sucralfate  (CARAFATE ) 1 g tablet Take 1 tablet (1 g total) by mouth 4 (four) times daily -  with meals and at bedtime. Dissolve tablet in 4 tablespoons of warm water  then swish and swallow 30 minutes before meals. (Patient not taking: Reported on 06/29/2023) 120 tablet 0   tiZANidine  (ZANAFLEX ) 4 MG tablet Take 1 tablet (4 mg total) by mouth at bedtime. (Patient not taking:  Reported on 06/17/2023) 90 tablet 1   No current facility-administered medications for this visit.   Facility-Administered Medications Ordered in Other Visits  Medication Dose Route Frequency Provider Last Rate Last Admin   0.9 %  sodium chloride  infusion   Intravenous Continuous Laguana Desautel J, MD   Stopped at 05/13/23 1535   0.9 %  sodium chloride  infusion   Intravenous Continuous Charly Holcomb J, MD       0.9 % NaCl with KCl 20 mEq/ L  infusion   Intravenous Once Karsynn Deweese J, MD       CISplatin  (PLATINOL ) 50 mg in sodium chloride  0.9 % 250 mL chemo infusion  30 mg/m2 (Treatment Plan Recorded) Intravenous Once Bryauna Byrum J, MD       dexamethasone  (DECADRON ) injection 10 mg  10 mg Intravenous Once Nasire Reali J, MD  fosaprepitant  (EMEND) 150 mg in sodium chloride  0.9 % 145 mL IVPB  150 mg Intravenous Once Rejeana Fadness J, MD       magnesium  sulfate IVPB 4 g 100 mL  4 g Intravenous Once Eulogio Requena J, MD       morphine  (PF) 2 MG/ML injection            palonosetron  (ALOXI ) injection 0.25 mg  0.25 mg Intravenous Once Siobhan Zaro J, MD        OBJECTIVE: Vitals:   07/13/23 0903  BP: 115/63  Pulse: 67  Resp: 15  Temp: (!) 96.8 F (36 C)  SpO2: 100%       There is no height or weight on file to calculate BMI.    ECOG FS:1 - Symptomatic but completely ambulatory  General: Well-developed, well-nourished, no acute distress.  Sitting in a wheelchair. Eyes: Pink conjunctiva, anicteric sclera. HEENT: Normocephalic, moist mucous membranes. Lungs: No audible wheezing or coughing. Heart: Regular rate and rhythm. Abdomen: Soft, nontender, no obvious distention. Musculoskeletal: 2+ lower extremity edema. Neuro: Alert, answering all questions appropriately. Cranial nerves grossly intact. Skin: No rashes or petechiae noted. Psych: Normal affect.   LAB RESULTS:  Lab Results  Component Value Date   NA 137 07/13/2023   K 3.5 07/13/2023   CL  106 07/13/2023   CO2 25 07/13/2023   GLUCOSE 111 (H) 07/13/2023   BUN 27 (H) 07/13/2023   CREATININE 1.49 (H) 07/13/2023   CALCIUM 8.7 (L) 07/13/2023   PROT 6.0 (L) 06/29/2023   ALBUMIN 3.0 (L) 06/29/2023   AST 18 06/29/2023   ALT 11 06/29/2023   ALKPHOS 51 06/29/2023   BILITOT 0.7 06/29/2023   GFRNONAA 34 (L) 07/13/2023   GFRAA 59 (L) 09/24/2019    Lab Results  Component Value Date   WBC 2.5 (L) 07/13/2023   NEUTROABS 1.9 07/13/2023   HGB 7.3 (L) 07/13/2023   HCT 22.6 (L) 07/13/2023   MCV 94.6 07/13/2023   PLT 80 (L) 07/13/2023     STUDIES: No results found.  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  Patient has now completed concurrent XRT along with chemotherapy.  Her last dose of mitomycin  was on March 07, 2023.  She finished Xeloda  and XRT on March 25, 2023.  Repeat PET scan results from April 07, 2023 reviewed independently with persistent hypermetabolism at the site of her anal cancer, but PET scan was done sooner than required given her head and neck cancer and this may be residual inflammation/irritation from XRT.  Appreciate surgical input. 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  Initial plan was to give concurrent XRT along with weekly cisplatin .  Patient's platelet count has improved to 80 today, therefore will proceed with cycle 4 of weekly cisplatin .  Continue daily XRT completing treatment on July 26, 2023.  Return to clinic in 1 week for further evaluation and consideration of cycle 6.      Thyroid  nodule with hypermetabolism: Defer ultrasound and biopsy until completion of treatment of 2 malignancies above. Anemia:  Hemoglobin is trended down to 7.3.  Proceed with treatment as above and patient will return to clinic tomorrow for 1  unit of packed red blood cells. Leukopenia: Total white blood cell count has dropped to 2.5.  Proceed cautiously with treatment as above.   Thrombocytopenia: Platelet improved to 80.  Proceed cautiously with treatment as above.   Hypomagnesia: Chronic and unchanged.  Patient received 4 g IV magnesium  along with her cisplatin  today.  Hypokalemia: Resolved.   Renal insufficiency: GFR slightly worse today at 34.  Monitor while giving cisplatin .  IV fluids and blood transfusion as above.   Dysphagia: Chronic and unchanged.  Monitor.  Patient expressed understanding and was in agreement with this plan. She also understands that She can call clinic at any time with any questions, concerns, or complaints.    Cancer Staging  Anal squamous cell carcinoma (HCC) Staging form: Anus, AJCC V9 - Clinical stage from 01/18/2023: Stage IIIA (cT3, cN0, cM0) - Signed by Shellie Dials, MD on 01/18/2023 Stage prefix: Initial diagnosis  Right tonsillar squamous cell carcinoma (HCC) Staging form: Pharynx - P16 Negative Oropharynx, AJCC 8th Edition - Clinical stage from 04/12/2023: Stage III (cT3, cN1, cM0, p16: Unknown) - Signed by Shellie Dials, MD on 04/12/2023 Stage prefix: Initial diagnosis   Shellie Dials, MD   07/13/2023 9:56 AM

## 2023-07-13 NOTE — Progress Notes (Signed)
 1000: Patient brought back for infusion. Per Adrian Alba, MD ok to proceed with tx with mag 1.5, hgb 7.3, and platelets 80. Added additional mag today (4 g total) and a type and screen to be drawn for blood transfusion tomorrow. Type and screen obtained. Pt complaining of a 10/10 headache and asking for something for pain. Per Adrian Alba, MD ok to give 2 mg iv morphine   1019: 2 mg iv morphine  given  1209: Patient holding head and stating her headache is 10/10 again. Adrian Alba, MD notified.   1225: Patient voided 100 ml and was unable to reach 200 ml prior to cisplatin . Adrian Alba, MD notified and verified ok to proceed with cisplatin  today. Pt stated she fell at home this morning and hit her head. Adrian Alba, MD notified  1322: 2 mg iv morphine  given per Adrian Alba, MD. Stat CT ordered as well to be scheduled after she completes tx today  1600: Pt completed treatment and fluids. Vitals stable. Pt transported to CT and pts son [facility]) was updated. Radiology informed to call son when pt has completed CT for transport home

## 2023-07-14 ENCOUNTER — Inpatient Hospital Stay

## 2023-07-14 ENCOUNTER — Ambulatory Visit

## 2023-07-15 ENCOUNTER — Ambulatory Visit

## 2023-07-15 ENCOUNTER — Ambulatory Visit: Admitting: Oncology

## 2023-07-15 ENCOUNTER — Inpatient Hospital Stay

## 2023-07-15 ENCOUNTER — Other Ambulatory Visit

## 2023-07-15 DIAGNOSIS — Z510 Encounter for antineoplastic radiation therapy: Secondary | ICD-10-CM | POA: Diagnosis not present

## 2023-07-15 DIAGNOSIS — D649 Anemia, unspecified: Secondary | ICD-10-CM

## 2023-07-15 MED ORDER — SODIUM CHLORIDE 0.9% IV SOLUTION
250.0000 mL | INTRAVENOUS | Status: DC
  Administered 2023-07-15: 100 mL via INTRAVENOUS
  Filled 2023-07-15: qty 250

## 2023-07-15 MED ORDER — DIPHENHYDRAMINE HCL 50 MG/ML IJ SOLN
25.0000 mg | Freq: Once | INTRAMUSCULAR | Status: AC
  Administered 2023-07-15: 25 mg via INTRAVENOUS
  Filled 2023-07-15: qty 1

## 2023-07-15 MED ORDER — SODIUM CHLORIDE 0.9% FLUSH
3.0000 mL | INTRAVENOUS | Status: DC | PRN
  Filled 2023-07-15: qty 3

## 2023-07-15 MED ORDER — ACETAMINOPHEN 325 MG PO TABS
650.0000 mg | ORAL_TABLET | Freq: Once | ORAL | Status: AC
  Administered 2023-07-15: 650 mg via ORAL
  Filled 2023-07-15: qty 2

## 2023-07-15 MED ORDER — HEPARIN SOD (PORK) LOCK FLUSH 100 UNIT/ML IV SOLN
500.0000 [IU] | Freq: Every day | INTRAVENOUS | Status: AC | PRN
  Administered 2023-07-15: 500 [IU]
  Filled 2023-07-15: qty 5

## 2023-07-15 MED ORDER — SODIUM CHLORIDE 0.9% FLUSH
10.0000 mL | INTRAVENOUS | Status: AC | PRN
  Administered 2023-07-15: 10 mL
  Filled 2023-07-15: qty 10

## 2023-07-16 LAB — TYPE AND SCREEN
ABO/RH(D): A POS
Antibody Screen: POSITIVE
Donor AG Type: NEGATIVE
Unit division: 0

## 2023-07-16 LAB — BPAM RBC
Blood Product Expiration Date: 202506282359
ISSUE DATE / TIME: 202506060938
Unit Type and Rh: 5100

## 2023-07-18 ENCOUNTER — Ambulatory Visit

## 2023-07-19 ENCOUNTER — Encounter: Payer: Self-pay | Admitting: Oncology

## 2023-07-19 ENCOUNTER — Ambulatory Visit
Admission: RE | Admit: 2023-07-19 | Discharge: 2023-07-19 | Disposition: A | Discharge status: Home / Self Care | Source: Ambulatory Visit | Attending: Radiation Oncology | Admitting: Radiation Oncology

## 2023-07-19 ENCOUNTER — Ambulatory Visit

## 2023-07-19 ENCOUNTER — Other Ambulatory Visit: Payer: Self-pay

## 2023-07-19 ENCOUNTER — Telehealth: Payer: Self-pay | Admitting: *Deleted

## 2023-07-19 DIAGNOSIS — Z510 Encounter for antineoplastic radiation therapy: Secondary | ICD-10-CM | POA: Diagnosis not present

## 2023-07-19 LAB — RAD ONC ARIA SESSION SUMMARY
Course Elapsed Days: 78
Plan Fractions Treated to Date: 1
Plan Prescribed Dose Per Fraction: 2 Gy
Plan Total Fractions Prescribed: 9
Plan Total Prescribed Dose: 18 Gy
Reference Point Dosage Given to Date: 54 Gy
Reference Point Session Dosage Given: 2 Gy
Session Number: 27

## 2023-07-19 NOTE — Telephone Encounter (Signed)
 Patient has a whole bunch of meds and it needs to be cleaned out the ones that she does not need or not using.  She is coming to a dry run of the radiation today and I asked the son to bring all the medications and they would go through it and then take the ones off that we do not need any more so that the med list be updated.  Son says that he will bring the meds

## 2023-07-20 ENCOUNTER — Ambulatory Visit
Admission: RE | Admit: 2023-07-20 | Discharge: 2023-07-20 | Disposition: A | Discharge status: Home / Self Care | Source: Ambulatory Visit | Attending: Radiation Oncology | Admitting: Radiation Oncology

## 2023-07-20 ENCOUNTER — Inpatient Hospital Stay

## 2023-07-20 ENCOUNTER — Encounter: Payer: Self-pay | Admitting: Oncology

## 2023-07-20 ENCOUNTER — Inpatient Hospital Stay (HOSPITAL_BASED_OUTPATIENT_CLINIC_OR_DEPARTMENT_OTHER): Admitting: Oncology

## 2023-07-20 ENCOUNTER — Ambulatory Visit

## 2023-07-20 ENCOUNTER — Ambulatory Visit: Admitting: Oncology

## 2023-07-20 ENCOUNTER — Other Ambulatory Visit

## 2023-07-20 ENCOUNTER — Encounter

## 2023-07-20 ENCOUNTER — Other Ambulatory Visit: Payer: Self-pay

## 2023-07-20 VITALS — BP 112/49 | HR 48 | Temp 96.5°F | Resp 16 | Ht 66.0 in | Wt 128.0 lb

## 2023-07-20 DIAGNOSIS — D649 Anemia, unspecified: Secondary | ICD-10-CM

## 2023-07-20 DIAGNOSIS — C099 Malignant neoplasm of tonsil, unspecified: Secondary | ICD-10-CM

## 2023-07-20 DIAGNOSIS — Z510 Encounter for antineoplastic radiation therapy: Secondary | ICD-10-CM | POA: Diagnosis not present

## 2023-07-20 LAB — CBC WITH DIFFERENTIAL (CANCER CENTER ONLY)
Abs Immature Granulocytes: 0.03 10*3/uL (ref 0.00–0.07)
Basophils Absolute: 0 10*3/uL (ref 0.0–0.1)
Basophils Relative: 0 %
Eosinophils Absolute: 0 10*3/uL (ref 0.0–0.5)
Eosinophils Relative: 0 %
HCT: 25.8 % — ABNORMAL LOW (ref 36.0–46.0)
Hemoglobin: 8.4 g/dL — ABNORMAL LOW (ref 12.0–15.0)
Immature Granulocytes: 1 %
Lymphocytes Relative: 7 %
Lymphs Abs: 0.2 10*3/uL — ABNORMAL LOW (ref 0.7–4.0)
MCH: 30.1 pg (ref 26.0–34.0)
MCHC: 32.6 g/dL (ref 30.0–36.0)
MCV: 92.5 fL (ref 80.0–100.0)
Monocytes Absolute: 0.3 10*3/uL (ref 0.1–1.0)
Monocytes Relative: 9 %
Neutro Abs: 2.7 10*3/uL (ref 1.7–7.7)
Neutrophils Relative %: 83 %
Platelet Count: 95 10*3/uL — ABNORMAL LOW (ref 150–400)
RBC: 2.79 MIL/uL — ABNORMAL LOW (ref 3.87–5.11)
RDW: 16 % — ABNORMAL HIGH (ref 11.5–15.5)
WBC Count: 3.3 10*3/uL — ABNORMAL LOW (ref 4.0–10.5)
nRBC: 0 % (ref 0.0–0.2)

## 2023-07-20 LAB — RAD ONC ARIA SESSION SUMMARY
Course Elapsed Days: 79
Plan Fractions Treated to Date: 2
Plan Prescribed Dose Per Fraction: 2 Gy
Plan Total Fractions Prescribed: 9
Plan Total Prescribed Dose: 18 Gy
Reference Point Dosage Given to Date: 56 Gy
Reference Point Session Dosage Given: 2 Gy
Session Number: 28

## 2023-07-20 LAB — BASIC METABOLIC PANEL - CANCER CENTER ONLY
Anion gap: 7 (ref 5–15)
BUN: 21 mg/dL (ref 8–23)
CO2: 26 mmol/L (ref 22–32)
Calcium: 8.6 mg/dL — ABNORMAL LOW (ref 8.9–10.3)
Chloride: 105 mmol/L (ref 98–111)
Creatinine: 1.38 mg/dL — ABNORMAL HIGH (ref 0.44–1.00)
GFR, Estimated: 38 mL/min — ABNORMAL LOW (ref >60)
Glucose, Bld: 157 mg/dL — ABNORMAL HIGH (ref 70–99)
Potassium: 3.6 mmol/L (ref 3.5–5.1)
Sodium: 138 mmol/L (ref 135–145)

## 2023-07-20 LAB — SAMPLE TO BLOOD BANK

## 2023-07-20 LAB — MAGNESIUM: Magnesium: 1.6 mg/dL — ABNORMAL LOW (ref 1.7–2.4)

## 2023-07-20 MED ORDER — MAGNESIUM SULFATE 4 GM/100ML IV SOLN
4.0000 g | Freq: Once | INTRAVENOUS | Status: AC
  Administered 2023-07-20: 4 g via INTRAVENOUS
  Filled 2023-07-20: qty 100

## 2023-07-20 MED ORDER — HEPARIN SOD (PORK) LOCK FLUSH 100 UNIT/ML IV SOLN
500.0000 [IU] | Freq: Once | INTRAVENOUS | Status: AC | PRN
  Administered 2023-07-20: 500 [IU]
  Filled 2023-07-20: qty 5

## 2023-07-20 MED ORDER — POTASSIUM CHLORIDE IN NACL 20-0.9 MEQ/L-% IV SOLN
Freq: Once | INTRAVENOUS | Status: AC
  Filled 2023-07-20: qty 1000

## 2023-07-20 MED ORDER — SODIUM CHLORIDE 0.9 % IV SOLN
INTRAVENOUS | Status: DC
  Filled 2023-07-20: qty 250

## 2023-07-20 MED ORDER — SODIUM CHLORIDE 0.9% FLUSH
10.0000 mL | INTRAVENOUS | Status: DC | PRN
  Administered 2023-07-20: 10 mL
  Filled 2023-07-20: qty 10

## 2023-07-20 MED ORDER — DEXAMETHASONE SODIUM PHOSPHATE 10 MG/ML IJ SOLN
10.0000 mg | Freq: Once | INTRAMUSCULAR | Status: AC
  Administered 2023-07-20: 10 mg via INTRAVENOUS
  Filled 2023-07-20: qty 1

## 2023-07-20 MED ORDER — MORPHINE SULFATE (PF) 2 MG/ML IV SOLN
2.0000 mg | Freq: Once | INTRAVENOUS | Status: AC
  Administered 2023-07-20: 2 mg via INTRAVENOUS
  Filled 2023-07-20: qty 1

## 2023-07-20 MED ORDER — SODIUM CHLORIDE 0.9 % IV SOLN
20.0000 mg/m2 | Freq: Once | INTRAVENOUS | Status: AC
  Administered 2023-07-20: 31 mg via INTRAVENOUS
  Filled 2023-07-20: qty 31

## 2023-07-20 MED ORDER — PALONOSETRON HCL INJECTION 0.25 MG/5ML
0.2500 mg | Freq: Once | INTRAVENOUS | Status: AC
  Administered 2023-07-20: 0.25 mg via INTRAVENOUS
  Filled 2023-07-20: qty 5

## 2023-07-20 MED ORDER — SODIUM CHLORIDE 0.9 % IV SOLN
150.0000 mg | Freq: Once | INTRAVENOUS | Status: AC
  Administered 2023-07-20: 150 mg via INTRAVENOUS
  Filled 2023-07-20: qty 150

## 2023-07-20 NOTE — Progress Notes (Signed)
 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  Patient has completed treatment for anus cancer.  Now receiving cisplatin  and radiation for head and neck cancer.  Noted hospitalization 5/24-5/27 for delirium, fall, left lower DVT.    Met with patient during infusion. Reports that her appetite is good.  Had a ham biscuit for breakfast this am.  Ate spaghetti last night and salad (this is always what she tells this writer at every visit?).  Denies pain with swallowing or pain in her mouth.  Has not drank any of the protein shakes because she thinks that give her diarrhea.    Medications: reviewed  Labs: reviewed  Anthropometrics:   Weight 128 lb today (no swelling) 135 lb 9.6 oz (bilateral edema) 127 lb 9.6 oz on 5/2 114 lb on 4/18 122 lb on 4/14 121 lb on 4/11   NUTRITION DIAGNOSIS: Unintentional weight loss stable    INTERVENTION:  Reviewed ways to add calorie and protein in diet Encouraged snacking between meals.     MONITORING, EVALUATION, GOAL: weight trends, intake   NEXT VISIT: Thursday, June 19 during infusion   Jennelle Pinkstaff B. Zollie Hipp, CSO, LDN Registered Dietitian [phone]

## 2023-07-20 NOTE — Patient Instructions (Signed)

## 2023-07-20 NOTE — Patient Instructions (Signed)
 [facility] - A DEPT OF [facility]  Discharge Instructions: Thank you for choosing [facility] to provide your oncology and hematology care.  If you have a lab appointment with the Cancer Center, please go directly to the Cancer Center and check in at the registration area.  Wear comfortable clothing and clothing appropriate for easy access to any Portacath or PICC line.   We strive to give you quality time with your provider. You may need to reschedule your appointment if you arrive late (15 or more minutes).  Arriving late affects you and other patients whose appointments are after yours.  Also, if you miss three or more appointments without notifying the office, you may be dismissed from the clinic at the provider's discretion.      For prescription refill requests, have your pharmacy contact our office and allow 72 hours for refills to be completed.    Today you received the following chemotherapy and/or immunotherapy agents Cisplatin      To help prevent nausea and vomiting after your treatment, we encourage you to take your nausea medication as directed.  BELOW ARE SYMPTOMS THAT SHOULD BE REPORTED IMMEDIATELY: *FEVER GREATER THAN 100.4 F (38 C) OR HIGHER *CHILLS OR SWEATING *NAUSEA AND VOMITING THAT IS NOT CONTROLLED WITH YOUR NAUSEA MEDICATION *UNUSUAL SHORTNESS OF BREATH *UNUSUAL BRUISING OR BLEEDING *URINARY PROBLEMS (pain or burning when urinating, or frequent urination) *BOWEL PROBLEMS (unusual diarrhea, constipation, pain near the anus) TENDERNESS IN MOUTH AND THROAT WITH OR WITHOUT PRESENCE OF ULCERS (sore throat, sores in mouth, or a toothache) UNUSUAL RASH, SWELLING OR PAIN  UNUSUAL VAGINAL DISCHARGE OR ITCHING   Items with * indicate a potential emergency and should be followed up as soon as possible or go to the Emergency Department if any problems should occur.  Please show the CHEMOTHERAPY ALERT CARD or IMMUNOTHERAPY  ALERT CARD at check-in to the Emergency Department and triage nurse.  Should you have questions after your visit or need to cancel or reschedule your appointment, please contact [facility] - A DEPT OF [facility]  [phone] and follow the prompts.  Office hours are 8:00 a.m. to 4:30 p.m. Monday - Friday. Please note that voicemails left after 4:00 p.m. may not be returned until the following business day.  We are closed weekends and major holidays. You have access to a nurse at all times for urgent questions. Please call the main number to the clinic [phone] and follow the prompts.  For any non-urgent questions, you may also contact your provider using MyChart. We now offer e-Visits for anyone 83 and older to request care online for non-urgent symptoms. For details visit [email or web address].   Also download the MyChart app! Go to the app store, search "MyChart", open the app, select [facility], and log in with your MyChart username and password.

## 2023-07-20 NOTE — Progress Notes (Signed)
 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  INTERVAL HISTORY: Patient returns to clinic today for further evaluation and consideration of cycle 5 of dose reduced cisplatin .  She currently feels well and is asymptomatic.  She reports no recent episodes of confusion.  Her pain is currently well-controlled.  She denies any dysphagia.  She continues to have chronic weakness and fatigue. She has no neurologic complaints.  She denies any recent fevers or illnesses. She has no chest pain, shortness of breath, cough, or hemoptysis.  She denies any nausea, vomiting, constipation, or diarrhea.  She has no melena or hematochezia.  She has no urinary complaints.  Patient offers no further specific complaints today.  REVIEW OF SYSTEMS:   Review of Systems  Constitutional:  Positive for malaise/fatigue. Negative for fever and weight loss.  HENT:  Negative for sore throat.   Respiratory: Negative.  Negative for cough, hemoptysis and shortness of breath.   Cardiovascular: Negative.  Negative for chest pain and leg swelling.  Gastrointestinal: Negative.  Negative for abdominal pain, blood in stool, diarrhea and melena.  Genitourinary: Negative.  Negative for dysuria.  Musculoskeletal: Negative.  Negative for back pain.  Skin: Negative.  Negative for rash.  Neurological:  Positive for weakness. Negative for dizziness, focal weakness and headaches.  Psychiatric/Behavioral:  Negative.  The patient is not nervous/anxious.    As per HPI. Otherwise, a complete review of systems is negative.  PAST MEDICAL HISTORY: Past Medical History:  Diagnosis Date   Anemia    Diabetes mellitus without complication (HCC)    Gout    Hyperlipidemia    Hypertension    Wears dentures    full upper and lower    PAST SURGICAL HISTORY: Past Surgical History:  Procedure Laterality Date   APPENDECTOMY     gallstones  06/12/2009   MICROLARYNGOSCOPY Right 01/28/2023   Procedure: MICRODIRECT LARYNGOSCOPY WITH BIOPSY OF OROPHARYNGEAL MASS;  Surgeon: Lesly Raspberry, MD;  Location: [facility];  Service: ENT;  Laterality: Right;  Diabetic   PORTACATH PLACEMENT N/A 02/18/2023   Procedure: INSERTION PORT-A-CATH;  Surgeon: Flynn Hylan, MD;  Location: [facility];  Service: General;  Laterality: N/A;   TONSILLECTOMY      FAMILY HISTORY: Family History  Problem Relation Age of Onset   Breast cancer Neg Hx     ADVANCED DIRECTIVES (Y/N):  N  HEALTH MAINTENANCE: Social History   Tobacco Use   Smoking status: Former    Current packs/day: 0.00    Average packs/day: 0.5 packs/day for 8.0 years (4.0 ttl pk-yrs)    Types: Cigarettes    Start date: 4    Quit date: 1990    Years since quitting: 35.4    Passive exposure: Past   Smokeless tobacco: Never  Vaping Use   Vaping status: Never Used  Substance Use Topics   Alcohol use: No    Alcohol/week: 0.0 standard drinks of alcohol   Drug use: No     Colonoscopy:  PAP:  Bone density:  Lipid panel:  No Known Allergies  Current Outpatient Medications  Medication Sig Dispense Refill   Accu-Chek Softclix Lancets lancets      amLODipine (NORVASC) 10 MG tablet Take 10 mg by mouth daily.     apixaban (ELIQUIS) 2.5 MG TABS tablet Take 2.5 mg by mouth.     carvedilol  (COREG ) 3.125 MG tablet Take 1 tablet by mouth 2 (two) times daily. cardio     colchicine  0.6 MG tablet Take by mouth.     cyanocobalamin  (VITAMIN  B12) 1000 MCG tablet Take 1 tablet by mouth daily.     dexamethasone  (DECADRON ) 2 MG tablet Take 1 tablet (2 mg total) by mouth 2 (two) times daily with a meal. 45 tablet 0   diphenoxylate -atropine  (LOMOTIL ) 2.5-0.025 MG tablet Take 1 tablet by mouth 4 (four) times daily as needed for diarrhea or loose stools. 30 tablet 0   glucose blood (ACCU-CHEK AVIVA PLUS) test strip USE TWICE DAILY AS DIRECTED     hydrochlorothiazide  (MICROZIDE ) 12.5 MG capsule Take 12.5 mg by mouth.     lisinopril  (ZESTRIL ) 20 MG tablet TAKE 1 TABLET(20 MG) BY MOUTH DAILY 90 tablet 2   magic mouthwash (multi-ingredient) oral suspension Swish and swallow 5-10 mLs 4 (four) times daily as needed. 480 mL 3   metFORMIN  (GLUCOPHAGE ) 500 MG tablet One every morning 90 tablet 1   oxyCODONE  (ROXICODONE ) 5 MG/5ML solution Take 5 mLs (5 mg total) by mouth every 4 (four) hours as needed for severe pain (pain score 7-10). 100 mL 0   simvastatin  (ZOCOR ) 20 MG tablet Take 20 mg by mouth daily.     aspirin 81 MG tablet Take 1 tablet by mouth daily. (Patient not taking: Reported on 07/13/2023)     capecitabine  (XELODA ) 500 MG tablet Take 2 tablets (1,000 mg total) by mouth 2 (two) times daily after a meal. Take Monday-Friday. Take only on days of radiation. (Patient not taking: Reported on 06/17/2023) 120 tablet 0   cephALEXin (KEFLEX) 500 MG capsule Take 500 mg by mouth 4 (four) times daily. (Patient not taking: Reported on 07/20/2023)     fluconazole  (DIFLUCAN ) 100 MG tablet Take two tablets x 1 day (200mg ), then take 1 tablet daily until completed (Patient not taking: Reported on 07/20/2023) 14 tablet 0   sucralfate  (CARAFATE ) 1 g tablet Take 1 tablet (1 g total) by mouth 4 (four) times daily -  with meals and at bedtime. Dissolve tablet in 4 tablespoons of warm water  then swish and swallow 30 minutes before meals. (Patient not taking: Reported on 06/17/2023) 120 tablet 0   tiZANidine  (ZANAFLEX ) 4 MG tablet Take 1 tablet (4 mg total) by mouth at  bedtime. (Patient not taking: Reported on 06/17/2023) 90 tablet 1   No current facility-administered medications for this visit.   Facility-Administered Medications Ordered in Other Visits  Medication Dose Route Frequency Provider Last Rate Last Admin   0.9 %  sodium chloride  infusion   Intravenous Continuous Ashwath Lasch J, MD   Stopped at 05/13/23 1535   morphine  (PF) 2 MG/ML injection             OBJECTIVE: Vitals:   07/20/23 0842  BP: (!) 112/49  Pulse: (!) 48  Resp: 16  Temp: (!) 96.5 F (35.8 C)  SpO2: 100%       Body mass index is 20.66 kg/m.    ECOG FS:1 - Symptomatic but completely ambulatory  General: Well-developed, well-nourished, no acute distress.  Sitting in wheelchair. Eyes: Pink conjunctiva, anicteric sclera. HEENT: Normocephalic, moist mucous membranes.  Lungs: No audible wheezing or coughing. Heart: Regular rate and rhythm. Abdomen: Soft, nontender, no obvious distention. Musculoskeletal: 2+ lower extremity edema. Neuro: Alert, answering all questions appropriately. Cranial nerves grossly intact. Skin: No rashes or petechiae noted. Psych: Normal affect.   LAB RESULTS:  Lab Results  Component Value Date   NA 138 07/20/2023   K 3.6 07/20/2023   CL 105 07/20/2023   CO2 26 07/20/2023   GLUCOSE 157 (H) 07/20/2023   BUN 21 07/20/2023   CREATININE 1.38 (H) 07/20/2023   CALCIUM 8.6 (L) 07/20/2023   PROT 6.0 (L) 06/29/2023   ALBUMIN 3.0 (L) 06/29/2023   AST 18 06/29/2023   ALT 11 06/29/2023   ALKPHOS 51 06/29/2023   BILITOT 0.7 06/29/2023   GFRNONAA 38 (L) 07/20/2023   GFRAA 59 (L) 09/24/2019    Lab Results  Component Value Date   WBC 3.3 (L) 07/20/2023   NEUTROABS 2.7 07/20/2023   HGB 8.4 (L) 07/20/2023   HCT 25.8 (L) 07/20/2023   MCV 92.5 07/20/2023   PLT 95 (L) 07/20/2023     STUDIES: CT HEAD WO CONTRAST ( ) Result Date: 07/13/2023 CLINICAL DATA:  Fall with head injury.  Headache. EXAM: CT HEAD WITHOUT CONTRAST TECHNIQUE:  Contiguous axial images were obtained from the base of the skull through the vertex without intravenous contrast. RADIATION DOSE REDUCTION: This exam was performed according to the departmental dose-optimization program which includes automated exposure control, adjustment of the mA and/or kV according to patient size and/or use of iterative reconstruction technique. COMPARISON:  None Available. FINDINGS: Brain: No intracranial hemorrhage, mass effect, or midline shift. Brain volume is normal for age. No hydrocephalus. The basilar cisterns are patent. Mild periventricular and deep white matter hypodensity typical of chronic small vessel ischemia. No evidence of territorial infarct or acute ischemia. No extra-axial or intracranial fluid collection. Vascular: Atherosclerosis of skullbase vasculature without hyperdense vessel or abnormal calcification. Skull: No fracture or focal lesion. Sinuses/Orbits: No acute finding. Other: None. IMPRESSION: 1. No acute intracranial abnormality. No skull fracture. 2. Mild chronic small vessel ischemia. Electronically Signed   By: Chadwick Colonel M.D.   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  Patient has now completed concurrent XRT along with chemotherapy.  Her last dose of mitomycin  was on March 07, 2023.  She finished Xeloda  and XRT on March 25, 2023.  Repeat PET scan results from April 07, 2023 reviewed independently with persistent hypermetabolism at the site of her anal cancer, but PET scan was done sooner than required given her head and neck cancer and this may be residual inflammation/irritation from XRT.  Appreciate surgical  input. 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  Initial plan was to give concurrent XRT along with weekly cisplatin .  Patient platelet count is 95, therefore we will proceed with cycle 5 of dose reduced cisplatin .  Continue daily XRT completing treatment on July 29, 2023.  Return to clinic in 1 week for further evaluation and consideration of her 6th and final treatment of cisplatin .   Thyroid  nodule with hypermetabolism: Defer ultrasound and biopsy until completion of treatment of 2 malignancies above. Anemia: Hemoglobin improved  to 8.4 with 1 unit of packed red blood cells last week.   Leukopenia: Total white blood cell count improved to 3.3.  Proceed with treatment as above. Thrombocytopenia: Platelet count improved to 95.  Proceed with treatment as above.   Hypomagnesia: Chronic and unchanged.  Patient will receive 4 g IV magnesium  along with her cisplatin  today.  Hypokalemia: Resolved.   Renal insufficiency: GFR improved to 38.  Monitor closely while giving cisplatin .   Pain: Well-controlled.  Patient states she took 1 pain pill today.   Dysphagia: Patient does not complain of this today.  Patient expressed understanding and was in agreement with this plan. She also understands that She can call clinic at any time with any questions, concerns, or complaints.    Cancer Staging  Anal squamous cell carcinoma (HCC) Staging form: Anus, AJCC V9 - Clinical stage from 01/18/2023: Stage IIIA (cT3, cN0, cM0) - Signed by Shellie Dials, MD on 01/18/2023 Stage prefix: Initial diagnosis  Right tonsillar squamous cell carcinoma (HCC) Staging form: Pharynx - P16 Negative Oropharynx, AJCC 8th Edition - Clinical stage from 04/12/2023: Stage III (cT3, cN1, cM0, p16: Unknown) - Signed by Shellie Dials, MD on 04/12/2023 Stage prefix: Initial diagnosis   Shellie Dials, MD   07/20/2023 9:20 AM

## 2023-07-21 ENCOUNTER — Encounter

## 2023-07-21 ENCOUNTER — Ambulatory Visit

## 2023-07-21 ENCOUNTER — Other Ambulatory Visit

## 2023-07-21 ENCOUNTER — Other Ambulatory Visit: Payer: Self-pay

## 2023-07-21 ENCOUNTER — Ambulatory Visit: Admitting: Oncology

## 2023-07-21 ENCOUNTER — Ambulatory Visit
Admission: RE | Admit: 2023-07-21 | Discharge: 2023-07-21 | Disposition: A | Discharge status: Home / Self Care | Source: Ambulatory Visit | Attending: Radiation Oncology | Admitting: Radiation Oncology

## 2023-07-21 DIAGNOSIS — Z510 Encounter for antineoplastic radiation therapy: Secondary | ICD-10-CM | POA: Diagnosis not present

## 2023-07-21 LAB — RAD ONC ARIA SESSION SUMMARY
Course Elapsed Days: 80
Plan Fractions Treated to Date: 3
Plan Prescribed Dose Per Fraction: 2 Gy
Plan Total Fractions Prescribed: 9
Plan Total Prescribed Dose: 18 Gy
Reference Point Dosage Given to Date: 58 Gy
Reference Point Session Dosage Given: 2 Gy
Session Number: 29

## 2023-07-22 ENCOUNTER — Ambulatory Visit
Admission: RE | Admit: 2023-07-22 | Discharge: 2023-07-22 | Disposition: A | Discharge status: Home / Self Care | Source: Ambulatory Visit | Attending: Radiation Oncology | Admitting: Radiation Oncology

## 2023-07-22 ENCOUNTER — Other Ambulatory Visit: Payer: Self-pay

## 2023-07-22 DIAGNOSIS — Z510 Encounter for antineoplastic radiation therapy: Secondary | ICD-10-CM | POA: Diagnosis not present

## 2023-07-22 LAB — RAD ONC ARIA SESSION SUMMARY
Course Elapsed Days: 81
Plan Fractions Treated to Date: 4
Plan Prescribed Dose Per Fraction: 2 Gy
Plan Total Fractions Prescribed: 9
Plan Total Prescribed Dose: 18 Gy
Reference Point Dosage Given to Date: 60 Gy
Reference Point Session Dosage Given: 2 Gy
Session Number: 30

## 2023-07-25 ENCOUNTER — Other Ambulatory Visit: Payer: Self-pay

## 2023-07-25 ENCOUNTER — Ambulatory Visit
Admission: RE | Admit: 2023-07-25 | Discharge: 2023-07-25 | Disposition: A | Discharge status: Home / Self Care | Source: Ambulatory Visit | Attending: Radiation Oncology | Admitting: Radiation Oncology

## 2023-07-25 DIAGNOSIS — Z510 Encounter for antineoplastic radiation therapy: Secondary | ICD-10-CM | POA: Diagnosis not present

## 2023-07-25 LAB — RAD ONC ARIA SESSION SUMMARY
Course Elapsed Days: 84
Plan Fractions Treated to Date: 5
Plan Prescribed Dose Per Fraction: 2 Gy
Plan Total Fractions Prescribed: 9
Plan Total Prescribed Dose: 18 Gy
Reference Point Dosage Given to Date: 62 Gy
Reference Point Session Dosage Given: 2 Gy
Session Number: 31

## 2023-07-26 ENCOUNTER — Other Ambulatory Visit: Payer: Self-pay

## 2023-07-26 ENCOUNTER — Ambulatory Visit
Admission: RE | Admit: 2023-07-26 | Discharge: 2023-07-26 | Disposition: A | Discharge status: Home / Self Care | Source: Ambulatory Visit | Attending: Radiation Oncology | Admitting: Radiation Oncology

## 2023-07-26 DIAGNOSIS — Z510 Encounter for antineoplastic radiation therapy: Secondary | ICD-10-CM | POA: Diagnosis not present

## 2023-07-26 LAB — RAD ONC ARIA SESSION SUMMARY
Course Elapsed Days: 85
Plan Fractions Treated to Date: 6
Plan Prescribed Dose Per Fraction: 2 Gy
Plan Total Fractions Prescribed: 9
Plan Total Prescribed Dose: 18 Gy
Reference Point Dosage Given to Date: 64 Gy
Reference Point Session Dosage Given: 2 Gy
Session Number: 32

## 2023-07-27 ENCOUNTER — Encounter: Payer: Self-pay | Admitting: Hospice and Palliative Medicine

## 2023-07-27 ENCOUNTER — Other Ambulatory Visit: Payer: Self-pay

## 2023-07-27 ENCOUNTER — Ambulatory Visit
Admission: RE | Admit: 2023-07-27 | Discharge: 2023-07-27 | Disposition: A | Discharge status: Home / Self Care | Source: Ambulatory Visit | Attending: Radiation Oncology | Admitting: Radiation Oncology

## 2023-07-27 ENCOUNTER — Inpatient Hospital Stay

## 2023-07-27 ENCOUNTER — Other Ambulatory Visit: Payer: Self-pay | Admitting: *Deleted

## 2023-07-27 ENCOUNTER — Ambulatory Visit: Payer: Medicare HMO

## 2023-07-27 ENCOUNTER — Inpatient Hospital Stay (HOSPITAL_BASED_OUTPATIENT_CLINIC_OR_DEPARTMENT_OTHER): Admitting: Oncology

## 2023-07-27 VITALS — BP 149/50 | HR 53 | Temp 97.0°F | Resp 17 | Wt 121.0 lb

## 2023-07-27 DIAGNOSIS — C099 Malignant neoplasm of tonsil, unspecified: Secondary | ICD-10-CM

## 2023-07-27 DIAGNOSIS — Z510 Encounter for antineoplastic radiation therapy: Secondary | ICD-10-CM | POA: Diagnosis not present

## 2023-07-27 LAB — URINALYSIS, COMPLETE (UACMP) WITH MICROSCOPIC
Bacteria, UA: NONE SEEN
Bilirubin Urine: NEGATIVE
Glucose, UA: NEGATIVE mg/dL
Hgb urine dipstick: NEGATIVE
Ketones, ur: NEGATIVE mg/dL
Leukocytes,Ua: NEGATIVE
Nitrite: NEGATIVE
Protein, ur: NEGATIVE mg/dL
Specific Gravity, Urine: 1.012 (ref 1.005–1.030)
pH: 5 (ref 5.0–8.0)

## 2023-07-27 LAB — CBC WITH DIFFERENTIAL/PLATELET
Abs Immature Granulocytes: 0.03 10*3/uL (ref 0.00–0.07)
Basophils Absolute: 0 10*3/uL (ref 0.0–0.1)
Basophils Relative: 0 %
Eosinophils Absolute: 0 10*3/uL (ref 0.0–0.5)
Eosinophils Relative: 0 %
HCT: 24.7 % — ABNORMAL LOW (ref 36.0–46.0)
Hemoglobin: 8 g/dL — ABNORMAL LOW (ref 12.0–15.0)
Immature Granulocytes: 1 %
Lymphocytes Relative: 7 %
Lymphs Abs: 0.2 10*3/uL — ABNORMAL LOW (ref 0.7–4.0)
MCH: 30.2 pg (ref 26.0–34.0)
MCHC: 32.4 g/dL (ref 30.0–36.0)
MCV: 93.2 fL (ref 80.0–100.0)
Monocytes Absolute: 0.3 10*3/uL (ref 0.1–1.0)
Monocytes Relative: 11 %
Neutro Abs: 2.4 10*3/uL (ref 1.7–7.7)
Neutrophils Relative %: 81 %
Platelets: 82 10*3/uL — ABNORMAL LOW (ref 150–400)
RBC: 2.65 MIL/uL — ABNORMAL LOW (ref 3.87–5.11)
RDW: 16 % — ABNORMAL HIGH (ref 11.5–15.5)
WBC: 3 10*3/uL — ABNORMAL LOW (ref 4.0–10.5)
nRBC: 0 % (ref 0.0–0.2)

## 2023-07-27 LAB — CMP (CANCER CENTER ONLY)
ALT: 10 U/L (ref 0–44)
AST: 19 U/L (ref 15–41)
Albumin: 3 g/dL — ABNORMAL LOW (ref 3.5–5.0)
Alkaline Phosphatase: 42 U/L (ref 38–126)
Anion gap: 7 (ref 5–15)
BUN: 34 mg/dL — ABNORMAL HIGH (ref 8–23)
CO2: 26 mmol/L (ref 22–32)
Calcium: 8.3 mg/dL — ABNORMAL LOW (ref 8.9–10.3)
Chloride: 102 mmol/L (ref 98–111)
Creatinine: 1.56 mg/dL — ABNORMAL HIGH (ref 0.44–1.00)
GFR, Estimated: 33 mL/min — ABNORMAL LOW (ref >60)
Glucose, Bld: 106 mg/dL — ABNORMAL HIGH (ref 70–99)
Potassium: 3.8 mmol/L (ref 3.5–5.1)
Sodium: 135 mmol/L (ref 135–145)
Total Bilirubin: 0.8 mg/dL (ref 0.0–1.2)
Total Protein: 5.8 g/dL — ABNORMAL LOW (ref 6.5–8.1)

## 2023-07-27 LAB — RAD ONC ARIA SESSION SUMMARY
Course Elapsed Days: 86
Plan Fractions Treated to Date: 7
Plan Prescribed Dose Per Fraction: 2 Gy
Plan Total Fractions Prescribed: 9
Plan Total Prescribed Dose: 18 Gy
Reference Point Dosage Given to Date: 66 Gy
Reference Point Session Dosage Given: 2 Gy
Session Number: 33

## 2023-07-27 LAB — SAMPLE TO BLOOD BANK

## 2023-07-27 LAB — MAGNESIUM: Magnesium: 1.7 mg/dL (ref 1.7–2.4)

## 2023-07-27 NOTE — Progress Notes (Signed)
 Concerns of Feet swlling 2 nights, confusion

## 2023-07-27 NOTE — Progress Notes (Signed)
 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  INTERVAL HISTORY: Patient returns to clinic today as an add-on with complaints of decreased performance status and increasing confusion.  She also has worsening peripheral edema.  Upon arrival to clinic, patient is back to her baseline.  Her daughter-in-law reports she was unable to dress herself or get in the car, today her appointments, but agrees she is back to her baseline now.  Her pain is currently well-controlled.  She denies any dysphagia.  She continues to have chronic weakness and fatigue. She has no neurologic complaints.  She denies any recent fevers or illnesses. She has no chest pain, shortness of breath, cough, or hemoptysis.  She denies any nausea, vomiting, constipation, or diarrhea.  She has no melena or hematochezia.  She has no urinary complaints.  Patient offers no further specific complaints today.  REVIEW OF SYSTEMS:   Review of Systems  Constitutional:  Positive for malaise/fatigue. Negative for fever and weight loss.  HENT:  Negative for sore throat.   Respiratory: Negative.  Negative for cough, hemoptysis and shortness of breath.   Cardiovascular:  Positive for leg swelling. Negative for chest pain.  Gastrointestinal: Negative.  Negative for abdominal pain, blood in stool, diarrhea and melena.  Genitourinary: Negative.  Negative for dysuria.  Musculoskeletal: Negative.  Negative for back pain.   Skin: Negative.  Negative for rash.  Neurological:  Positive for weakness. Negative for dizziness, focal weakness and headaches.  Psychiatric/Behavioral: Negative.  The patient is not nervous/anxious.    As per HPI. Otherwise, a complete review of systems is negative.  PAST MEDICAL HISTORY: Past Medical History:  Diagnosis Date   Anemia    Diabetes mellitus without complication (HCC)    Gout    Hyperlipidemia    Hypertension    Wears dentures    full upper and lower    PAST SURGICAL HISTORY: Past Surgical History:  Procedure Laterality Date   APPENDECTOMY     gallstones  06/12/2009   MICROLARYNGOSCOPY Right 01/28/2023   Procedure: MICRODIRECT LARYNGOSCOPY WITH BIOPSY OF OROPHARYNGEAL MASS;  Surgeon: Lesly Raspberry, MD;  Location: [facility];  Service: ENT;  Laterality: Right;  Diabetic   PORTACATH PLACEMENT N/A 02/18/2023   Procedure: INSERTION PORT-A-CATH;  Surgeon: Flynn Hylan, MD;  Location: [facility];  Service: General;  Laterality: N/A;   TONSILLECTOMY      FAMILY HISTORY: Family History  Problem Relation Age of Onset   Breast cancer Neg Hx     ADVANCED DIRECTIVES (Y/N):  N  HEALTH MAINTENANCE: Social History   Tobacco Use   Smoking status: Former    Current packs/day: 0.00    Average packs/day: 0.5 packs/day for 8.0 years (4.0 ttl pk-yrs)    Types: Cigarettes    Start date: 31    Quit date: 1990    Years since quitting: 35.4    Passive exposure: Past   Smokeless tobacco: Never  Vaping Use   Vaping status: Never Used  Substance Use Topics   Alcohol use: No  Alcohol/week: 0.0 standard drinks of alcohol   Drug use: No     Colonoscopy:  PAP:  Bone density:  Lipid panel:  No Known Allergies  Current Outpatient Medications  Medication Sig Dispense Refill   Accu-Chek Softclix Lancets lancets      amLODipine (NORVASC) 10 MG tablet Take 10 mg by mouth daily.     apixaban (ELIQUIS) 2.5 MG TABS tablet Take 2.5 mg by mouth.      aspirin 81 MG tablet Take 1 tablet by mouth daily.     capecitabine  (XELODA ) 500 MG tablet Take 2 tablets (1,000 mg total) by mouth 2 (two) times daily after a meal. Take Monday-Friday. Take only on days of radiation. 120 tablet 0   carvedilol  (COREG ) 3.125 MG tablet Take 1 tablet by mouth 2 (two) times daily. cardio     cephALEXin (KEFLEX) 500 MG capsule Take 500 mg by mouth 4 (four) times daily.     colchicine  0.6 MG tablet Take by mouth.     cyanocobalamin  (VITAMIN B12) 1000 MCG tablet Take 1 tablet by mouth daily.     dexamethasone  (DECADRON ) 2 MG tablet Take 1 tablet (2 mg total) by mouth 2 (two) times daily with a meal. 45 tablet 0   diphenoxylate -atropine  (LOMOTIL ) 2.5-0.025 MG tablet Take 1 tablet by mouth 4 (four) times daily as needed for diarrhea or loose stools. 30 tablet 0   fluconazole  (DIFLUCAN ) 100 MG tablet Take two tablets x 1 day (200mg ), then take 1 tablet daily until completed 14 tablet 0   glucose blood (ACCU-CHEK AVIVA PLUS) test strip USE TWICE DAILY AS DIRECTED     hydrochlorothiazide  (MICROZIDE ) 12.5 MG capsule Take 12.5 mg by mouth.     lisinopril  (ZESTRIL ) 20 MG tablet TAKE 1 TABLET(20 MG) BY MOUTH DAILY 90 tablet 2   magic mouthwash (multi-ingredient) oral suspension Swish and swallow 5-10 mLs 4 (four) times daily as needed. 480 mL 3   metFORMIN  (GLUCOPHAGE ) 500 MG tablet One every morning 90 tablet 1   oxyCODONE  (ROXICODONE ) 5 MG/5ML solution Take 5 mLs (5 mg total) by mouth every 4 (four) hours as needed for severe pain (pain score 7-10). 100 mL 0   simvastatin  (ZOCOR ) 20 MG tablet Take 20 mg by mouth daily.     sucralfate  (CARAFATE ) 1 g tablet Take 1 tablet (1 g total) by mouth 4 (four) times daily -  with meals and at bedtime. Dissolve tablet in 4 tablespoons of warm water  then swish and swallow 30 minutes before meals. (Patient not taking: Reported on 07/27/2023) 120 tablet 0   tiZANidine  (ZANAFLEX ) 4 MG tablet Take 1 tablet (4 mg total) by mouth at bedtime. (Patient  not taking: Reported on 07/27/2023) 90 tablet 1   No current facility-administered medications for this visit.   Facility-Administered Medications Ordered in Other Visits  Medication Dose Route Frequency Provider Last Rate Last Admin   0.9 %  sodium chloride  infusion   Intravenous Continuous Shellie Dials, MD   Stopped at 05/13/23 1535   morphine  (PF) 2 MG/ML injection             OBJECTIVE: Vitals:   07/27/23 1520  BP: (!) 149/50  Pulse: (!) 53  Resp: 17  Temp: (!) 97 F (36.1 C)  SpO2: 100%       Body mass index is 19.53 kg/m.    ECOG FS:1 - Symptomatic but completely ambulatory  General: Well-developed, well-nourished, no acute distress.  Sitting in a wheelchair. Eyes: Pink conjunctiva, anicteric  sclera. HEENT: Normocephalic, moist mucous membranes. Lungs: No audible wheezing or coughing. Heart: Regular rate and rhythm. Abdomen: Soft, nontender, no obvious distention. Musculoskeletal: No edema, cyanosis, or clubbing. Neuro: Alert, answering all questions appropriately. Cranial nerves grossly intact. Skin: No rashes or petechiae noted. Psych: Normal affect.  LAB RESULTS:  Lab Results  Component Value Date   NA 135 07/27/2023   K 3.8 07/27/2023   CL 102 07/27/2023   CO2 26 07/27/2023   GLUCOSE 106 (H) 07/27/2023   BUN 34 (H) 07/27/2023   CREATININE 1.56 (H) 07/27/2023   CALCIUM 8.3 (L) 07/27/2023   PROT 5.8 (L) 07/27/2023   ALBUMIN 3.0 (L) 07/27/2023   AST 19 07/27/2023   ALT 10 07/27/2023   ALKPHOS 42 07/27/2023   BILITOT 0.8 07/27/2023   GFRNONAA 33 (L) 07/27/2023   GFRAA 59 (L) 09/24/2019    Lab Results  Component Value Date   WBC 3.0 (L) 07/27/2023   NEUTROABS 2.4 07/27/2023   HGB 8.0 (L) 07/27/2023   HCT 24.7 (L) 07/27/2023   MCV 93.2 07/27/2023   PLT 82 (L) 07/27/2023     STUDIES: CT HEAD WO CONTRAST ( ) Result Date: 07/13/2023 CLINICAL DATA:  Fall with head injury.  Headache. EXAM: CT HEAD WITHOUT CONTRAST TECHNIQUE: Contiguous axial  images were obtained from the base of the skull through the vertex without intravenous contrast. RADIATION DOSE REDUCTION: This exam was performed according to the departmental dose-optimization program which includes automated exposure control, adjustment of the mA and/or kV according to patient size and/or use of iterative reconstruction technique. COMPARISON:  None Available. FINDINGS: Brain: No intracranial hemorrhage, mass effect, or midline shift. Brain volume is normal for age. No hydrocephalus. The basilar cisterns are patent. Mild periventricular and deep white matter hypodensity typical of chronic small vessel ischemia. No evidence of territorial infarct or acute ischemia. No extra-axial or intracranial fluid collection. Vascular: Atherosclerosis of skullbase vasculature without hyperdense vessel or abnormal calcification. Skull: No fracture or focal lesion. Sinuses/Orbits: No acute finding. Other: None. IMPRESSION: 1. No acute intracranial abnormality. No skull fracture. 2. Mild chronic small vessel ischemia. Electronically Signed   By: Chadwick Colonel M.D.   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  Patient has now completed concurrent XRT along with chemotherapy.  Her last dose of mitomycin  was on March 07, 2023.  She finished Xeloda  and XRT on March 25, 2023.  Repeat PET scan results from April 07, 2023 reviewed independently with persistent hypermetabolism at the site of her anal cancer, but PET scan was done sooner than required given her head and neck cancer and this may be residual inflammation/irritation from XRT.  Appreciate surgical input. Stage III  squamous cell carcinoma of the right tonsil: PET scan results as above.  Initial plan was to give concurrent XRT along with weekly cisplatin .  Patient platelet count is 95, therefore we will proceed with cycle 5 of dose reduced cisplatin .  Continue daily XRT completing treatment on July 29, 2023.  Patient has been instructed to return to clinic tomorrow for further evaluation and consideration of the final treatment of cisplatin .   Thyroid  nodule with hypermetabolism: Defer ultrasound and biopsy until completion of  treatment of 2 malignancies above. Anemia: Hemoglobin trended down slightly to 8.0.  Monitor.   Leukopenia: Chronic unchanged.  Patient's total white blood cell count is 3.0.  Thrombocytopenia: Platelet trended down to 82.  Monitor.   Hypomagnesia: Chronic and unchanged.  Patient will receive 4 g IV magnesium  along with treatment. Renal insufficiency: GFR decreased to 33.  Have encouraged increased fluid intake.  Monitor closely while giving cisplatin .   Pain: Well-controlled.  Continue current narcotics. Dysphagia: Patient does not complain of this today. Confusion: Unclear etiology.  Appears to be intermittent and only at home.  Patient's daughter-in-law reports she is her normal self during the clinic visit but gets significantly confused at home.  Continue to monitor closely.  Patient expressed understanding and was in agreement with this plan. She also understands that She can call clinic at any time with any questions, concerns, or complaints.    Cancer Staging  Anal squamous cell carcinoma (HCC) Staging form: Anus, AJCC V9 - Clinical stage from 01/18/2023: Stage IIIA (cT3, cN0, cM0) - Signed by Shellie Dials, MD on 01/18/2023 Stage prefix: Initial diagnosis  Right tonsillar squamous cell carcinoma (HCC) Staging form: Pharynx - P16 Negative Oropharynx, AJCC 8th Edition - Clinical stage from 04/12/2023: Stage III (cT3, cN1, cM0, p16: Unknown) - Signed by Shellie Dials,  MD on 04/12/2023 Stage prefix: Initial diagnosis   Shellie Dials, MD   07/27/2023 3:55 PM

## 2023-07-28 ENCOUNTER — Ambulatory Visit: Admitting: Oncology

## 2023-07-28 ENCOUNTER — Inpatient Hospital Stay

## 2023-07-28 ENCOUNTER — Ambulatory Visit

## 2023-07-28 ENCOUNTER — Inpatient Hospital Stay (HOSPITAL_BASED_OUTPATIENT_CLINIC_OR_DEPARTMENT_OTHER): Admitting: Oncology

## 2023-07-28 ENCOUNTER — Other Ambulatory Visit

## 2023-07-28 ENCOUNTER — Other Ambulatory Visit: Payer: Self-pay

## 2023-07-28 ENCOUNTER — Ambulatory Visit
Admission: RE | Admit: 2023-07-28 | Discharge: 2023-07-28 | Disposition: A | Discharge status: Home / Self Care | Source: Ambulatory Visit | Attending: Radiation Oncology | Admitting: Radiation Oncology

## 2023-07-28 ENCOUNTER — Encounter: Payer: Self-pay | Admitting: Oncology

## 2023-07-28 VITALS — BP 121/44 | HR 51 | Temp 96.0°F | Resp 19 | Ht 66.0 in | Wt 121.0 lb

## 2023-07-28 VITALS — BP 120/42 | HR 52

## 2023-07-28 DIAGNOSIS — D649 Anemia, unspecified: Secondary | ICD-10-CM | POA: Diagnosis not present

## 2023-07-28 DIAGNOSIS — Z510 Encounter for antineoplastic radiation therapy: Secondary | ICD-10-CM | POA: Diagnosis not present

## 2023-07-28 DIAGNOSIS — C099 Malignant neoplasm of tonsil, unspecified: Secondary | ICD-10-CM

## 2023-07-28 LAB — URINE CULTURE: Culture: NO GROWTH

## 2023-07-28 LAB — RAD ONC ARIA SESSION SUMMARY
Course Elapsed Days: 87
Plan Fractions Treated to Date: 8
Plan Prescribed Dose Per Fraction: 2 Gy
Plan Total Fractions Prescribed: 9
Plan Total Prescribed Dose: 18 Gy
Reference Point Dosage Given to Date: 68 Gy
Reference Point Session Dosage Given: 2 Gy
Session Number: 34

## 2023-07-28 LAB — BASIC METABOLIC PANEL - CANCER CENTER ONLY
Anion gap: 5 (ref 5–15)
BUN: 33 mg/dL — ABNORMAL HIGH (ref 8–23)
CO2: 25 mmol/L (ref 22–32)
Calcium: 8.4 mg/dL — ABNORMAL LOW (ref 8.9–10.3)
Chloride: 103 mmol/L (ref 98–111)
Creatinine: 1.51 mg/dL — ABNORMAL HIGH (ref 0.44–1.00)
GFR, Estimated: 34 mL/min — ABNORMAL LOW (ref >60)
Glucose, Bld: 161 mg/dL — ABNORMAL HIGH (ref 70–99)
Potassium: 4.4 mmol/L (ref 3.5–5.1)
Sodium: 133 mmol/L — ABNORMAL LOW (ref 135–145)

## 2023-07-28 LAB — CBC WITH DIFFERENTIAL (CANCER CENTER ONLY)
Abs Immature Granulocytes: 0.02 10*3/uL (ref 0.00–0.07)
Basophils Absolute: 0 10*3/uL (ref 0.0–0.1)
Basophils Relative: 0 %
Eosinophils Absolute: 0 10*3/uL (ref 0.0–0.5)
Eosinophils Relative: 0 %
HCT: 23.7 % — ABNORMAL LOW (ref 36.0–46.0)
Hemoglobin: 7.7 g/dL — ABNORMAL LOW (ref 12.0–15.0)
Immature Granulocytes: 1 %
Lymphocytes Relative: 7 %
Lymphs Abs: 0.2 10*3/uL — ABNORMAL LOW (ref 0.7–4.0)
MCH: 30.2 pg (ref 26.0–34.0)
MCHC: 32.5 g/dL (ref 30.0–36.0)
MCV: 92.9 fL (ref 80.0–100.0)
Monocytes Absolute: 0.3 10*3/uL (ref 0.1–1.0)
Monocytes Relative: 8 %
Neutro Abs: 2.5 10*3/uL (ref 1.7–7.7)
Neutrophils Relative %: 84 %
Platelet Count: 92 10*3/uL — ABNORMAL LOW (ref 150–400)
RBC: 2.55 MIL/uL — ABNORMAL LOW (ref 3.87–5.11)
RDW: 15.9 % — ABNORMAL HIGH (ref 11.5–15.5)
WBC Count: 3 10*3/uL — ABNORMAL LOW (ref 4.0–10.5)
nRBC: 0 % (ref 0.0–0.2)

## 2023-07-28 LAB — PREPARE RBC (CROSSMATCH)

## 2023-07-28 LAB — MAGNESIUM: Magnesium: 1.7 mg/dL (ref 1.7–2.4)

## 2023-07-28 MED ORDER — SODIUM CHLORIDE 0.9 % IV SOLN
20.0000 mg/m2 | Freq: Once | INTRAVENOUS | Status: AC
  Administered 2023-07-28: 31 mg via INTRAVENOUS
  Filled 2023-07-28: qty 31

## 2023-07-28 MED ORDER — DEXAMETHASONE SODIUM PHOSPHATE 10 MG/ML IJ SOLN
10.0000 mg | Freq: Once | INTRAMUSCULAR | Status: AC
  Administered 2023-07-28: 10 mg via INTRAVENOUS
  Filled 2023-07-28: qty 1

## 2023-07-28 MED ORDER — HEPARIN SOD (PORK) LOCK FLUSH 100 UNIT/ML IV SOLN
500.0000 [IU] | Freq: Once | INTRAVENOUS | Status: AC | PRN
  Administered 2023-07-28: 500 [IU]
  Filled 2023-07-28: qty 5

## 2023-07-28 MED ORDER — SODIUM CHLORIDE 0.9 % IV SOLN
INTRAVENOUS | Status: DC
  Filled 2023-07-28 (×2): qty 250

## 2023-07-28 MED ORDER — POTASSIUM CHLORIDE IN NACL 20-0.9 MEQ/L-% IV SOLN
Freq: Once | INTRAVENOUS | Status: AC
  Filled 2023-07-28: qty 1000

## 2023-07-28 MED ORDER — MAGNESIUM SULFATE 4 GM/100ML IV SOLN
4.0000 g | Freq: Once | INTRAVENOUS | Status: AC
  Administered 2023-07-28: 4 g via INTRAVENOUS
  Filled 2023-07-28: qty 100

## 2023-07-28 MED ORDER — SODIUM CHLORIDE 0.9% FLUSH
10.0000 mL | INTRAVENOUS | Status: DC | PRN
  Administered 2023-07-28: 10 mL
  Filled 2023-07-28: qty 10

## 2023-07-28 MED ORDER — PALONOSETRON HCL INJECTION 0.25 MG/5ML
0.2500 mg | Freq: Once | INTRAVENOUS | Status: AC
  Administered 2023-07-28: 0.25 mg via INTRAVENOUS
  Filled 2023-07-28: qty 5

## 2023-07-28 MED ORDER — SODIUM CHLORIDE 0.9 % IV SOLN
150.0000 mg | Freq: Once | INTRAVENOUS | Status: AC
  Administered 2023-07-28: 150 mg via INTRAVENOUS
  Filled 2023-07-28: qty 150

## 2023-07-28 NOTE — Progress Notes (Signed)
 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  INTERVAL HISTORY: Patient returns to clinic today for further evaluation and consideration of her sixth and final treatment of weekly dose reduced cisplatin .  She currently feels well and is at her baseline.  No reported episodes of confusion overnight.  Her pain is well-controlled on her current narcotic regimen.  She continues to have peripheral edema.  She denies any dysphagia.  She continues to have chronic weakness and fatigue. She has no neurologic complaints.  She denies any recent fevers or illnesses. She has no chest pain, shortness of breath, cough, or hemoptysis.  She denies any nausea, vomiting, constipation, or diarrhea.  She has no melena or hematochezia.  She has no urinary complaints.  Patient offers no further specific complaints today.  REVIEW OF SYSTEMS:   Review of Systems  Constitutional:  Positive for malaise/fatigue. Negative for fever and weight loss.  HENT:  Negative for sore throat.   Respiratory: Negative.  Negative for cough, hemoptysis and shortness of breath.   Cardiovascular:  Positive for leg swelling. Negative for chest pain.  Gastrointestinal: Negative.  Negative for abdominal pain, blood in stool, diarrhea and melena.  Genitourinary: Negative.  Negative for dysuria.  Musculoskeletal: Negative.  Negative for back pain.  Skin: Negative.  Negative for rash.  Neurological:  Positive for  weakness. Negative for dizziness, focal weakness and headaches.  Psychiatric/Behavioral: Negative.  The patient is not nervous/anxious.    As per HPI. Otherwise, a complete review of systems is negative.  PAST MEDICAL HISTORY: Past Medical History:  Diagnosis Date   Anemia    Diabetes mellitus without complication (HCC)    Gout    Hyperlipidemia    Hypertension    Wears dentures    full upper and lower    PAST SURGICAL HISTORY: Past Surgical History:  Procedure Laterality Date   APPENDECTOMY     gallstones  06/12/2009   MICROLARYNGOSCOPY Right 01/28/2023   Procedure: MICRODIRECT LARYNGOSCOPY WITH BIOPSY OF OROPHARYNGEAL MASS;  Surgeon: Lesly Raspberry, MD;  Location: [facility];  Service: ENT;  Laterality: Right;  Diabetic   PORTACATH PLACEMENT N/A 02/18/2023   Procedure: INSERTION PORT-A-CATH;  Surgeon: Flynn Hylan, MD;  Location: [facility];  Service: General;  Laterality: N/A;   TONSILLECTOMY      FAMILY HISTORY: Family History  Problem Relation Age of Onset   Breast cancer Neg Hx     ADVANCED DIRECTIVES (Y/N):  N  HEALTH MAINTENANCE: Social History   Tobacco Use   Smoking status: Former    Current packs/day: 0.00    Average packs/day: 0.5 packs/day for 8.0 years (4.0 ttl pk-yrs)    Types: Cigarettes    Start date: 40    Quit date: 1990    Years since quitting: 35.4    Passive exposure: Past   Smokeless tobacco: Never  Vaping Use   Vaping status: Never Used  Substance Use Topics   Alcohol use: No    Alcohol/week: 0.0 standard drinks of alcohol   Drug use: No  Colonoscopy:  PAP:  Bone density:  Lipid panel:  No Known Allergies  Current Outpatient Medications  Medication Sig Dispense Refill   amLODipine (NORVASC) 10 MG tablet Take 10 mg by mouth daily.     apixaban (ELIQUIS) 2.5 MG TABS tablet Take 2.5 mg by mouth.     aspirin 81 MG tablet Take 1 tablet by mouth daily.     carvedilol  (COREG ) 3.125 MG tablet Take 1 tablet by mouth  2 (two) times daily. cardio     colchicine  0.6 MG tablet Take by mouth.     cyanocobalamin  (VITAMIN B12) 1000 MCG tablet Take 1 tablet by mouth daily.     dexamethasone  (DECADRON ) 2 MG tablet Take 1 tablet (2 mg total) by mouth 2 (two) times daily with a meal. 45 tablet 0   diphenoxylate -atropine  (LOMOTIL ) 2.5-0.025 MG tablet Take 1 tablet by mouth 4 (four) times daily as needed for diarrhea or loose stools. 30 tablet 0   lisinopril -hydrochlorothiazide  (ZESTORETIC ) 20-25 MG tablet Take 1 tablet by mouth daily.     magic mouthwash (multi-ingredient) oral suspension Swish and swallow 5-10 mLs 4 (four) times daily as needed. 480 mL 3   metFORMIN  (GLUCOPHAGE ) 500 MG tablet One every morning 90 tablet 1   oxycodone  (OXY-IR) 5 MG capsule Take 5 mg by mouth every 4 (four) hours as needed for pain.     simvastatin  (ZOCOR ) 20 MG tablet Take 20 mg by mouth daily.     tiZANidine  (ZANAFLEX ) 4 MG tablet Take 1 tablet (4 mg total) by mouth at bedtime. 90 tablet 1   No current facility-administered medications for this visit.   Facility-Administered Medications Ordered in Other Visits  Medication Dose Route Frequency Provider Last Rate Last Admin   0.9 %  sodium chloride  infusion   Intravenous Continuous Tayson Schnelle J, MD   Stopped at 05/13/23 1535   0.9 %  sodium chloride  infusion   Intravenous Continuous Hades Mathew J, MD 10 mL/hr at 07/28/23 1014 New Bag at 07/28/23 1014   CISplatin  (PLATINOL ) 31 mg in sodium chloride  0.9 % 250 mL chemo infusion  20 mg/m2 (Treatment Plan Recorded) Intravenous Once Amedeo Detweiler J, MD       heparin  lock flush 100 unit/mL  500 Units Intracatheter Once PRN Lauramae Kneisley J, MD       morphine  (PF) 2 MG/ML injection            sodium chloride  flush (NS) 0.9 % injection 10 mL  10 mL Intracatheter PRN Shellie Dials, MD        OBJECTIVE: Vitals:   07/28/23 0844  BP: (!) 121/44  Pulse: (!) 51  Resp: 19  Temp: (!) 96 F (35.6 C)  SpO2: 100%        Body mass index is 19.53 kg/m.    ECOG FS:1 - Symptomatic but completely ambulatory  General: Well-developed, well-nourished, no acute distress.  Sitting in a wheelchair. Eyes: Pink conjunctiva, anicteric sclera. HEENT: Normocephalic, moist mucous membranes. Lungs: No audible wheezing or coughing. Heart: Regular rate and rhythm. Abdomen: Soft, nontender, no obvious distention. Musculoskeletal: Bilateral lower extremity edema.   Neuro: Alert, answering all questions appropriately. Cranial nerves grossly intact. Skin: No rashes or petechiae noted. Psych: Normal affect.  LAB RESULTS:  Lab Results  Component Value Date   NA 133 (L) 07/28/2023   K 4.4 07/28/2023   CL 103 07/28/2023   CO2 25 07/28/2023   GLUCOSE 161 (H) 07/28/2023   BUN 33 (H) 07/28/2023  CREATININE 1.51 (H) 07/28/2023   CALCIUM 8.4 (L) 07/28/2023   PROT 5.8 (L) 07/27/2023   ALBUMIN 3.0 (L) 07/27/2023   AST 19 07/27/2023   ALT 10 07/27/2023   ALKPHOS 42 07/27/2023   BILITOT 0.8 07/27/2023   GFRNONAA 34 (L) 07/28/2023   GFRAA 59 (L) 09/24/2019    Lab Results  Component Value Date   WBC 3.0 (L) 07/28/2023   NEUTROABS 2.5 07/28/2023   HGB 7.7 (L) 07/28/2023   HCT 23.7 (L) 07/28/2023   MCV 92.9 07/28/2023   PLT 92 (L) 07/28/2023     STUDIES: CT HEAD WO CONTRAST ( ) Result Date: 07/13/2023 CLINICAL DATA:  Fall with head injury.  Headache. EXAM: CT HEAD WITHOUT CONTRAST TECHNIQUE: Contiguous axial images were obtained from the base of the skull through the vertex without intravenous contrast. RADIATION DOSE REDUCTION: This exam was performed according to the departmental dose-optimization program which includes automated exposure control, adjustment of the mA and/or kV according to patient size and/or use of iterative reconstruction technique. COMPARISON:  None Available. FINDINGS: Brain: No intracranial hemorrhage, mass effect, or midline shift. Brain volume is normal for age. No hydrocephalus. The basilar  cisterns are patent. Mild periventricular and deep white matter hypodensity typical of chronic small vessel ischemia. No evidence of territorial infarct or acute ischemia. No extra-axial or intracranial fluid collection. Vascular: Atherosclerosis of skullbase vasculature without hyperdense vessel or abnormal calcification. Skull: No fracture or focal lesion. Sinuses/Orbits: No acute finding. Other: None. IMPRESSION: 1. No acute intracranial abnormality. No skull fracture. 2. Mild chronic small vessel ischemia. Electronically Signed   By: Chadwick Colonel M.D.   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  Patient has now completed concurrent XRT along with chemotherapy.  Her last dose of mitomycin  was on March 07, 2023.  She finished Xeloda  and XRT on March 25, 2023.  Repeat PET scan results from April 07, 2023 reviewed independently with persistent hypermetabolism at the site of her anal cancer, but PET scan was done sooner than required given her head and neck cancer and this may be residual inflammation/irritation from XRT.  Appreciate surgical input. 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  Initial plan was to give concurrent XRT along with weekly cisplatin .  Patient's platelet count is stable at 92, therefore will proceed with her sixth and final dose of weekly dose reduced cisplatin . Continue daily XRT completing treatment on July 29, 2023.  Return to clinic in 1 week for laboratory work and further evaluation.  Will repeat PET scan in approximately September 2025. Thyroid  nodule with hypermetabolism: Defer ultrasound and biopsy  until completion of treatment of 2 malignancies above. Anemia: Hemoglobin is trended down to 7.7.  Return to clinic tomorrow for 1 unit of packed red blood cells. Leukopenia: Chronic and unchanged.  Patient's total white blood cell count is 3.0 today.  Thrombocytopenia: Chronic and unchanged.  Patient's platelet count is 92 today. Hypomagnesia: Magnesium  is within normal limits today.  Patient receives 4 g IV magnesium  along with treatment. Renal insufficiency: Chronic and unchanged.  Patient's GFR is 34 today.  Have encouraged increased fluid intake.  Monitor closely while  giving cisplatin .   Pain: Well-controlled.  Continue current narcotics. Dysphagia: Patient does not complain of this today. Confusion: Unclear etiology.  Appears to be intermittent and only at home.  Patient's daughter-in-law reports she is her normal self during the clinic visit but gets significantly confused at home.  Continue to monitor closely.  Patient expressed understanding and was in agreement with this plan. She also understands that She can call clinic at any time with any questions, concerns, or complaints.    Cancer Staging  Anal squamous cell carcinoma (HCC) Staging form: Anus, AJCC V9 - Clinical stage from 01/18/2023: Stage IIIA (cT3, cN0, cM0) - Signed by Shellie Dials, MD on 01/18/2023 Stage prefix: Initial diagnosis  Right tonsillar squamous cell carcinoma (HCC) Staging form: Pharynx - P16 Negative Oropharynx, AJCC 8th Edition - Clinical stage from 04/12/2023: Stage III (cT3, cN1, cM0, p16: Unknown) - Signed by Shellie Dials, MD on 04/12/2023 Stage prefix: Initial diagnosis   Shellie Dials, MD   07/28/2023 1:18 PM

## 2023-07-28 NOTE — Patient Instructions (Signed)
 [facility] - A DEPT OF [facility]  Discharge Instructions: Thank you for choosing [facility] to provide your oncology and hematology care.  If you have a lab appointment with the Cancer Center, please go directly to the Cancer Center and check in at the registration area.  Wear comfortable clothing and clothing appropriate for easy access to any Portacath or PICC line.   We strive to give you quality time with your provider. You may need to reschedule your appointment if you arrive late (15 or more minutes).  Arriving late affects you and other patients whose appointments are after yours.  Also, if you miss three or more appointments without notifying the office, you may be dismissed from the clinic at the provider's discretion.      For prescription refill requests, have your pharmacy contact our office and allow 72 hours for refills to be completed.    Today you received the following chemotherapy and/or immunotherapy agents- cisplatin      To help prevent nausea and vomiting after your treatment, we encourage you to take your nausea medication as directed.  BELOW ARE SYMPTOMS THAT SHOULD BE REPORTED IMMEDIATELY: *FEVER GREATER THAN 100.4 F (38 C) OR HIGHER *CHILLS OR SWEATING *NAUSEA AND VOMITING THAT IS NOT CONTROLLED WITH YOUR NAUSEA MEDICATION *UNUSUAL SHORTNESS OF BREATH *UNUSUAL BRUISING OR BLEEDING *URINARY PROBLEMS (pain or burning when urinating, or frequent urination) *BOWEL PROBLEMS (unusual diarrhea, constipation, pain near the anus) TENDERNESS IN MOUTH AND THROAT WITH OR WITHOUT PRESENCE OF ULCERS (sore throat, sores in mouth, or a toothache) UNUSUAL RASH, SWELLING OR PAIN  UNUSUAL VAGINAL DISCHARGE OR ITCHING   Items with * indicate a potential emergency and should be followed up as soon as possible or go to the Emergency Department if any problems should occur.  Please show the CHEMOTHERAPY ALERT CARD or IMMUNOTHERAPY  ALERT CARD at check-in to the Emergency Department and triage nurse.  Should you have questions after your visit or need to cancel or reschedule your appointment, please contact [facility] - A DEPT OF [facility]  [phone] and follow the prompts.  Office hours are 8:00 a.m. to 4:30 p.m. Monday - Friday. Please note that voicemails left after 4:00 p.m. may not be returned until the following business day.  We are closed weekends and major holidays. You have access to a nurse at all times for urgent questions. Please call the main number to the clinic [phone] and follow the prompts.  For any non-urgent questions, you may also contact your provider using MyChart. We now offer e-Visits for anyone 9 and older to request care online for non-urgent symptoms. For details visit [email or web address].   Also download the MyChart app! Go to the app store, search "MyChart", open the app, select , and log in with your MyChart username and password.

## 2023-07-28 NOTE — Progress Notes (Signed)
 Per MD, ok to resume post-hydration with cisplatin.

## 2023-07-28 NOTE — Patient Instructions (Signed)

## 2023-07-28 NOTE — Progress Notes (Signed)
 Nutrition Follow-up:   Patient with SCC of anus and head and neck cancer.  Patient has completed treatment for anus cancer.  Now receiving cisplatin  and radiation for head and neck cancer.  Last radiation planned for tomorrow 6/20.    Met with patient during infusion.  She was eating peanut butter crackers and drinking coffee.  Says that her appetite is good.  Always tells this writer that she ate spaghetti last night for dinner and salad.  Denies pain with swallowing or pain in mouth.      Medications: reviewed  Labs: reviewed  Anthropometrics:   Weight 121 lb   128 lb on 6/11 135 lb (bilateral edema) 127 lb on 5/2 114 lb on 4/18 122 lb on 4/14 121 lb on 4/11  NUTRITION DIAGNOSIS: Unintentional weight loss continues    INTERVENTION:  Stressed importance to continue high calories and protein foods.  Reviewed those foods with her.  Encouraged eating q 1-2 hours    MONITORING, EVALUATION, GOAL: weight trends, intake   NEXT VISIT: Friday, June 27 during infusion  Kaileigh Viswanathan B. Zollie Hipp, CSO, LDN Registered Dietitian [phone]

## 2023-07-29 ENCOUNTER — Ambulatory Visit
Admission: RE | Admit: 2023-07-29 | Discharge: 2023-07-29 | Disposition: A | Discharge status: Home / Self Care | Source: Ambulatory Visit | Attending: Radiation Oncology | Admitting: Radiation Oncology

## 2023-07-29 ENCOUNTER — Other Ambulatory Visit: Payer: Self-pay

## 2023-07-29 ENCOUNTER — Inpatient Hospital Stay

## 2023-07-29 DIAGNOSIS — Z510 Encounter for antineoplastic radiation therapy: Secondary | ICD-10-CM | POA: Diagnosis not present

## 2023-07-29 DIAGNOSIS — C099 Malignant neoplasm of tonsil, unspecified: Secondary | ICD-10-CM

## 2023-07-29 LAB — RAD ONC ARIA SESSION SUMMARY
Course Elapsed Days: 88
Plan Fractions Treated to Date: 9
Plan Prescribed Dose Per Fraction: 2 Gy
Plan Total Fractions Prescribed: 9
Plan Total Prescribed Dose: 18 Gy
Reference Point Dosage Given to Date: 70 Gy
Reference Point Session Dosage Given: 2 Gy
Session Number: 35

## 2023-07-29 LAB — BPAM RBC
Blood Product Expiration Date: 202507182359
PRODUCT CODE: 202507182359

## 2023-07-29 MED ORDER — SODIUM CHLORIDE 0.9% IV SOLUTION
250.0000 mL | INTRAVENOUS | Status: DC
  Administered 2023-07-29: 100 mL via INTRAVENOUS
  Filled 2023-07-29: qty 250

## 2023-07-29 MED ORDER — ACETAMINOPHEN 325 MG PO TABS
650.0000 mg | ORAL_TABLET | Freq: Once | ORAL | Status: AC
  Administered 2023-07-29: 650 mg via ORAL
  Filled 2023-07-29: qty 2

## 2023-07-29 MED ORDER — DIPHENHYDRAMINE HCL 50 MG/ML IJ SOLN
25.0000 mg | Freq: Once | INTRAMUSCULAR | Status: AC
  Administered 2023-07-29: 25 mg via INTRAVENOUS
  Filled 2023-07-29: qty 1

## 2023-07-29 MED ORDER — HEPARIN SOD (PORK) LOCK FLUSH 100 UNIT/ML IV SOLN
250.0000 [IU] | INTRAVENOUS | Status: DC | PRN
  Filled 2023-07-29: qty 5

## 2023-07-30 LAB — TYPE AND SCREEN
ABO/RH(D): A POS
Antibody Screen: POSITIVE
Donor AG Type: NEGATIVE
Unit division: 0

## 2023-07-30 LAB — BPAM RBC
ISSUE DATE / TIME: 202506201226
Unit Type and Rh: 6200

## 2023-08-01 NOTE — Radiation Completion Notes (Signed)
 Patient Name: April [name], April [name] MRN: [id number] Date of Birth: [birth year] Referring Physician: CATHRYNE MOLT, M.D. Date of Service: 2023-08-01 Radiation Oncologist: Marcey Penton, M.D. 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  She has recently completed concurrent chemoradiation therapy for locally of asked squamous cell carcinoma of the anus.  She has done well healing well from that.  She specifically denies any significant GI upset or anal pain.  She was also noted on initial workup to have a large greater than 4 seen sonometer oropharyngeal lesion with multiple positive right sided lymph nodes..  Biopsy of the oropharynx was positive for squamous cell carcinoma involving the right tonsil.  PET scan demonstrated a large hypermetabolic mass in the oropharynx with 2 positive cervical lymph nodes.  She is having a sore throat some minor dysphagia at this time.  She has been seen by medical oncology and plan is to proceed with concurrent chemoradiation therapy with curative intent for her tonsillar carcinoma.      ==========DELIVERED PLANS==========  First Treatment Date: 2023-05-02 Last Treatment Date: 2023-07-29   Plan Name: HN Site: Oropharynx Technique: IMRT Mode: Photon Dose Per Fraction: 2 Gy Prescribed Dose (Delivered / Prescribed): 52 Gy / 52 Gy Prescribed Fxs (Delivered / Prescribed): 26 / 26   Plan Name: HN:1 Site: Oropharynx Technique: IMRT Mode: Photon Dose Per Fraction: 2 Gy Prescribed Dose (Delivered / Prescribed): 18 Gy / 18 Gy Prescribed Fxs (Delivered / Prescribed): 9 / 9      ==========ON TREATMENT VISIT DATES========== 2023-05-03, 2023-05-10, 2023-05-17, 2023-05-23, 2023-06-07, 2023-06-13, 2023-06-21, 2023-06-27, 2023-07-19, 2023-07-26     ==========UPCOMING VISITS==========       ==========APPENDIX - ON TREATMENT VISIT NOTES==========   See weekly On Treatment Notes in Epic for details in the Media tab (listed as Progress notes on the On Treatment Visit Dates listed above).

## 2023-08-04 ENCOUNTER — Inpatient Hospital Stay: Admitting: Oncology

## 2023-08-04 ENCOUNTER — Inpatient Hospital Stay

## 2023-08-04 ENCOUNTER — Encounter: Payer: Self-pay | Admitting: Oncology

## 2023-08-04 VITALS — BP 150/56 | HR 66 | Temp 98.8°F | Resp 16 | Ht 66.0 in | Wt 124.0 lb

## 2023-08-04 DIAGNOSIS — C099 Malignant neoplasm of tonsil, unspecified: Secondary | ICD-10-CM

## 2023-08-04 DIAGNOSIS — C210 Malignant neoplasm of anus, unspecified: Secondary | ICD-10-CM

## 2023-08-04 DIAGNOSIS — Z510 Encounter for antineoplastic radiation therapy: Secondary | ICD-10-CM | POA: Diagnosis not present

## 2023-08-04 LAB — SAMPLE TO BLOOD BANK

## 2023-08-04 LAB — MAGNESIUM: Magnesium: 1.6 mg/dL — ABNORMAL LOW (ref 1.7–2.4)

## 2023-08-04 LAB — CBC WITH DIFFERENTIAL/PLATELET
Abs Immature Granulocytes: 0.05 10*3/uL (ref 0.00–0.07)
Basophils Absolute: 0 10*3/uL (ref 0.0–0.1)
Basophils Relative: 0 %
Eosinophils Absolute: 0 10*3/uL (ref 0.0–0.5)
Eosinophils Relative: 0 %
HCT: 29.3 % — ABNORMAL LOW (ref 36.0–46.0)
Hemoglobin: 9.4 g/dL — ABNORMAL LOW (ref 12.0–15.0)
Immature Granulocytes: 1 %
Lymphocytes Relative: 6 %
Lymphs Abs: 0.2 10*3/uL — ABNORMAL LOW (ref 0.7–4.0)
MCH: 29.9 pg (ref 26.0–34.0)
MCHC: 32.1 g/dL (ref 30.0–36.0)
MCV: 93.3 fL (ref 80.0–100.0)
Monocytes Absolute: 0.3 10*3/uL (ref 0.1–1.0)
Monocytes Relative: 8 %
Neutro Abs: 3.1 10*3/uL (ref 1.7–7.7)
Neutrophils Relative %: 85 %
Platelets: 81 10*3/uL — ABNORMAL LOW (ref 150–400)
RBC: 3.14 MIL/uL — ABNORMAL LOW (ref 3.87–5.11)
RDW: 15.5 % (ref 11.5–15.5)
WBC: 3.6 10*3/uL — ABNORMAL LOW (ref 4.0–10.5)
nRBC: 0 % (ref 0.0–0.2)

## 2023-08-04 LAB — CMP (CANCER CENTER ONLY)
ALT: 10 U/L (ref 0–44)
AST: 16 U/L (ref 15–41)
Albumin: 3.3 g/dL — ABNORMAL LOW (ref 3.5–5.0)
Alkaline Phosphatase: 49 U/L (ref 38–126)
Anion gap: 9 (ref 5–15)
BUN: 34 mg/dL — ABNORMAL HIGH (ref 8–23)
CO2: 24 mmol/L (ref 22–32)
Calcium: 8.7 mg/dL — ABNORMAL LOW (ref 8.9–10.3)
Chloride: 103 mmol/L (ref 98–111)
Creatinine: 1.33 mg/dL — ABNORMAL HIGH (ref 0.44–1.00)
GFR, Estimated: 39 mL/min — ABNORMAL LOW (ref >60)
Glucose, Bld: 98 mg/dL (ref 70–99)
Potassium: 4.2 mmol/L (ref 3.5–5.1)
Sodium: 136 mmol/L (ref 135–145)
Total Bilirubin: 0.8 mg/dL (ref 0.0–1.2)
Total Protein: 6.3 g/dL — ABNORMAL LOW (ref 6.5–8.1)

## 2023-08-04 MED ORDER — HEPARIN SOD (PORK) LOCK FLUSH 100 UNIT/ML IV SOLN
500.0000 [IU] | Freq: Once | INTRAVENOUS | Status: AC
  Administered 2023-08-04: 500 [IU] via INTRAVENOUS
  Filled 2023-08-04: qty 5

## 2023-08-04 MED ORDER — SODIUM CHLORIDE 0.9% FLUSH
10.0000 mL | INTRAVENOUS | Status: DC | PRN
  Administered 2023-08-04: 10 mL via INTRAVENOUS
  Filled 2023-08-04: qty 10

## 2023-08-04 NOTE — Progress Notes (Signed)
 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  INTERVAL HISTORY: Patient returns to clinic today for repeat laboratory work and further evaluation.  She completed her XRT this morning.  She currently feels well and is at her baseline.  She has no reported episodes of confusion.  Her pain is well-controlled on her current narcotic regimen.  She continues to have peripheral edema.  She denies any dysphagia.  She has chronic weakness and fatigue, but states this is improved.  She has no neurologic complaints.  She denies any recent fevers or illnesses.  She has no chest pain, shortness of breath, cough, or hemoptysis.  She denies any nausea, vomiting, constipation, or diarrhea.  She has no melena or hematochezia.  She has no urinary complaints.  Patient offers no further specific complaints today.  REVIEW OF SYSTEMS:   Review of Systems  Constitutional:  Positive for malaise/fatigue. Negative for fever and weight loss.  HENT:  Negative for sore throat.   Respiratory: Negative.  Negative for cough, hemoptysis and shortness of breath.   Cardiovascular:  Positive for leg swelling. Negative for chest pain.  Gastrointestinal: Negative.  Negative for abdominal pain, blood in stool, diarrhea and melena.  Genitourinary: Negative.  Negative for dysuria.  Musculoskeletal: Negative.  Negative for back pain.  Skin: Negative.  Negative for rash.  Neurological:  Positive for  weakness. Negative for dizziness, focal weakness and headaches.  Psychiatric/Behavioral: Negative.  The patient is not nervous/anxious.    As per HPI. Otherwise, a complete review of systems is negative.  PAST MEDICAL HISTORY: Past Medical History:  Diagnosis Date   Anemia    Diabetes mellitus without complication (HCC)    Gout    Hyperlipidemia    Hypertension    Wears dentures    full upper and lower    PAST SURGICAL HISTORY: Past Surgical History:  Procedure Laterality Date   APPENDECTOMY     gallstones  06/12/2009   MICROLARYNGOSCOPY Right 01/28/2023   Procedure: MICRODIRECT LARYNGOSCOPY WITH BIOPSY OF OROPHARYNGEAL MASS;  Surgeon: Herminio Miu, MD;  Location[facility];  Service: ENT;  Laterality: Right;  Diabetic   PORTACATH PLACEMENT N/A 02/18/2023   Procedure: INSERTION PORT-A-CATH;  Surgeon: Lane Shope, MD;  Location: [facility];  Service: General;  Laterality: N/A;   TONSILLECTOMY      FAMILY HISTORY: Family History  Problem Relation Age of Onset   Breast cancer Neg Hx     ADVANCED DIRECTIVES (Y/N):  N  HEALTH MAINTENANCE: Social History   Tobacco Use   Smoking status: Former    Current packs/day: 0.00    Average packs/day: 0.5 packs/day for 8.0 years (4.0 ttl pk-yrs)    Types: Cigarettes    Start date: 31    Quit date: 1990    Years since quitting: 35.5    Passive exposure: Past   Smokeless tobacco: Never  Vaping Use   Vaping status: Never Used  Substance Use Topics   Alcohol use: No    Alcohol/week: 0.0 standard drinks of alcohol  Drug use: No     Colonoscopy:  PAP:  Bone density:  Lipid panel:  No Known Allergies  Current Outpatient Medications  Medication Sig Dispense Refill   amLODipine (NORVASC) 10 MG tablet Take 10 mg by mouth daily.     apixaban (ELIQUIS) 2.5 MG TABS tablet Take 2.5 mg by mouth.     aspirin 81 MG tablet Take 1 tablet by mouth daily.     carvedilol  (COREG ) 3.125 MG tablet Take 1 tablet by mouth  2 (two) times daily. cardio     colchicine  0.6 MG tablet Take by mouth.     cyanocobalamin  (VITAMIN B12) 1000 MCG tablet Take 1 tablet by mouth daily.     dexamethasone  (DECADRON ) 2 MG tablet Take 1 tablet (2 mg total) by mouth 2 (two) times daily with a meal. 45 tablet 0   diphenoxylate -atropine  (LOMOTIL ) 2.5-0.025 MG tablet Take 1 tablet by mouth 4 (four) times daily as needed for diarrhea or loose stools. 30 tablet 0   lisinopril -hydrochlorothiazide  (ZESTORETIC ) 20-25 MG tablet Take 1 tablet by mouth daily.     magic mouthwash (multi-ingredient) oral suspension Swish and swallow 5-10 mLs 4 (four) times daily as needed. 480 mL 3   metFORMIN  (GLUCOPHAGE ) 500 MG tablet One every morning 90 tablet 1   oxycodone  (OXY-IR) 5 MG capsule Take 5 mg by mouth every 4 (four) hours as needed for pain.     simvastatin  (ZOCOR ) 20 MG tablet Take 20 mg by mouth daily.     tiZANidine  (ZANAFLEX ) 4 MG tablet Take 1 tablet (4 mg total) by mouth at bedtime. 90 tablet 1   No current facility-administered medications for this visit.   Facility-Administered Medications Ordered in Other Visits  Medication Dose Route Frequency Provider Last Rate Last Admin   0.9 %  sodium chloride  infusion   Intravenous Continuous Addaline Peplinski J, MD   Stopped at 05/13/23 1535   morphine  (PF) 2 MG/ML injection             OBJECTIVE: Vitals:   08/04/23 1000  BP: (!) 150/56  Pulse: 66  Resp: 16  Temp: 98.8 F (37.1 C)  SpO2: 100%       Body mass index is 20.01 kg/m.    ECOG FS:1 - Symptomatic but completely ambulatory  General: Well-developed, well-nourished, no acute distress. Eyes: Pink conjunctiva, anicteric sclera. HEENT: Normocephalic, moist mucous membranes. Lungs: No audible wheezing or coughing. Heart: Regular rate and rhythm. Abdomen: Soft, nontender, no obvious distention. Musculoskeletal: No edema, cyanosis, or clubbing. Neuro: Alert, answering all questions appropriately. Cranial nerves grossly  intact. Skin: No rashes or petechiae noted. Psych: Normal affect.  LAB RESULTS:  Lab Results  Component Value Date   NA 136 08/04/2023   K 4.2 08/04/2023   CL 103 08/04/2023   CO2 24 08/04/2023   GLUCOSE 98 08/04/2023   BUN 34 (H) 08/04/2023   CREATININE 1.33 (H) 08/04/2023   CALCIUM 8.7 (L) 08/04/2023   PROT 6.3 (L) 08/04/2023   ALBUMIN 3.3 (L) 08/04/2023   AST 16 08/04/2023   ALT 10 08/04/2023   ALKPHOS 49 08/04/2023   BILITOT 0.8 08/04/2023   GFRNONAA 39 (L) 08/04/2023   GFRAA 59 (L) 09/24/2019    Lab Results  Component Value Date   WBC 3.6 (L) 08/04/2023   NEUTROABS 3.1 08/04/2023   HGB 9.4 (L) 08/04/2023   HCT 29.3 (L) 08/04/2023   MCV 93.3 08/04/2023   PLT 81 (L) 08/04/2023     STUDIES: CT HEAD WO  CONTRAST ( ) Result Date: 07/13/2023 CLINICAL DATA:  Fall with head injury.  Headache. EXAM: CT HEAD WITHOUT CONTRAST TECHNIQUE: Contiguous axial images were obtained from the base of the skull through the vertex without intravenous contrast. RADIATION DOSE REDUCTION: This exam was performed according to the departmental dose-optimization program which includes automated exposure control, adjustment of the mA and/or kV according to patient size and/or use of iterative reconstruction technique. COMPARISON:  None Available. FINDINGS: Brain: No intracranial hemorrhage, mass effect, or midline shift. Brain volume is normal for age. No hydrocephalus. The basilar cisterns are patent. Mild periventricular and deep white matter hypodensity typical of chronic small vessel ischemia. No evidence of territorial infarct or acute ischemia. No extra-axial or intracranial fluid collection. Vascular: Atherosclerosis of skullbase vasculature without hyperdense vessel or abnormal calcification. Skull: No fracture or focal lesion. Sinuses/Orbits: No acute finding. Other: None. IMPRESSION: 1. No acute intracranial abnormality. No skull fracture. 2. Mild chronic small vessel ischemia. Electronically  Signed   By: Andrea Gasman M.D.   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  Patient has now completed concurrent XRT along with chemotherapy.  Her last dose of mitomycin  was on March 07, 2023.  She finished Xeloda  and XRT on March 25, 2023.  Repeat PET scan results from April 07, 2023 reviewed independently with persistent hypermetabolism at the site of her anal cancer, but PET scan was done sooner than required given her head and neck cancer and this may be residual inflammation/irritation from XRT.  Appreciate surgical input. 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  After multiple delays, patient finally received her seventh and final treatment of dose reduced cisplatin  on July 28, 2023.  She completed XRT on August 04, 2023.  No intervention is needed.  Return to clinic in 1 month with repeat laboratory work and further evaluation.  Will reimage with PET scan in approximately September 2025.  Thyroid  nodule with hypermetabolism: Defer ultrasound and biopsy until completion of treatment of 2 malignancies above. Anemia: Hemoglobin improved to 9.4 with transfusion last week.  Monitor. Leukopenia: Improved.  Patient's total white blood cell count is 3.6.   Thrombocytopenia: Chronic and unchanged.  Patient's platelet count is 81. Hypomagnesia: Mild.  Patient's magnesium  level is 1.6 today.  Monitor. Renal insufficiency: GFR slightly improved to 39. Pain: Well-controlled.  Continue current narcotics. Dysphagia: Patient does not complain of this today. Confusion: Unclear etiology.  Appears to be intermittent and only at home.   Patient's daughter-in-law reports she is her normal self during the clinic visit but gets significantly confused at home.  Continue to monitor closely.  Patient expressed understanding and was in agreement with this plan. She also understands that She can call clinic at any time with any questions, concerns, or complaints.    Cancer Staging  Anal squamous cell carcinoma (HCC) Staging form: Anus, AJCC V9 - Clinical stage from 01/18/2023: Stage IIIA (cT3, cN0, cM0) - Signed by [name] [name], MD on 01/18/2023 Stage prefix: Initial diagnosis  Right tonsillar squamous cell carcinoma (HCC) Staging form: Pharynx - P16 Negative Oropharynx, AJCC 8th Edition - Clinical stage from 04/12/2023: Stage III (cT3, cN1, cM0,  p16: Unknown) - Signed by [name] [name], MD on 04/12/2023 Stage prefix: Initial diagnosis   [name] [name], MD   08/04/2023 10:18 AM

## 2023-08-05 ENCOUNTER — Inpatient Hospital Stay

## 2023-08-05 ENCOUNTER — Other Ambulatory Visit: Payer: Self-pay

## 2023-08-14 ENCOUNTER — Other Ambulatory Visit: Payer: Self-pay | Admitting: Radiation Oncology

## 2023-08-16 ENCOUNTER — Other Ambulatory Visit: Payer: Self-pay

## 2023-08-16 ENCOUNTER — Ambulatory Visit: Admitting: Family Medicine

## 2023-08-16 ENCOUNTER — Encounter: Payer: Self-pay | Admitting: Family Medicine

## 2023-08-16 ENCOUNTER — Ambulatory Visit (INDEPENDENT_AMBULATORY_CARE_PROVIDER_SITE_OTHER): Admitting: Family Medicine

## 2023-08-16 VITALS — BP 140/70 | HR 88 | Ht 66.0 in | Wt 116.0 lb

## 2023-08-16 DIAGNOSIS — N1832 Chronic kidney disease, stage 3b: Secondary | ICD-10-CM

## 2023-08-16 DIAGNOSIS — E119 Type 2 diabetes mellitus without complications: Secondary | ICD-10-CM

## 2023-08-16 DIAGNOSIS — E1122 Type 2 diabetes mellitus with diabetic chronic kidney disease: Secondary | ICD-10-CM | POA: Diagnosis not present

## 2023-08-16 DIAGNOSIS — F0390 Unspecified dementia without behavioral disturbance: Secondary | ICD-10-CM | POA: Diagnosis not present

## 2023-08-16 DIAGNOSIS — E785 Hyperlipidemia, unspecified: Secondary | ICD-10-CM | POA: Diagnosis not present

## 2023-08-16 DIAGNOSIS — E1169 Type 2 diabetes mellitus with other specified complication: Secondary | ICD-10-CM | POA: Diagnosis not present

## 2023-08-16 DIAGNOSIS — M4696 Unspecified inflammatory spondylopathy, lumbar region: Secondary | ICD-10-CM

## 2023-08-16 DIAGNOSIS — M47816 Spondylosis without myelopathy or radiculopathy, lumbar region: Secondary | ICD-10-CM

## 2023-08-16 DIAGNOSIS — R600 Localized edema: Secondary | ICD-10-CM

## 2023-08-16 LAB — POCT GLYCOSYLATED HEMOGLOBIN (HGB A1C): Hemoglobin A1C: 5.9 % — AB (ref 4.0–5.6)

## 2023-08-16 MED ORDER — TIZANIDINE HCL 4 MG PO TABS: 4.0000 mg | ORAL_TABLET | Freq: Every day | ORAL | 0 refills | Status: DC

## 2023-08-16 NOTE — Assessment & Plan Note (Signed)
 Lab Results  Component Value Date   HGBA1C 5.9 (A) 08/16/2023   Discontinue Metformin  since her A1C is over corrected and due to CKD stage 3 b, will recommend Diet control only. Monitor and follow up 3 months.

## 2023-08-16 NOTE — Assessment & Plan Note (Signed)
 She is on chronic opioid managed by Oncologist. She also takes Zanaflex , refill sent per request.

## 2023-08-16 NOTE — Assessment & Plan Note (Signed)
 Continue Zocor  20 mg every day.

## 2023-08-16 NOTE — Assessment & Plan Note (Signed)
 Encourage low salt diet, foot elevation and compression stockings.

## 2023-08-16 NOTE — Progress Notes (Signed)
 New Patient Office Visit  Subjective    Patient ID: April [name], female    DOB: 1940-02-19  Age: 83 y.o. MRN: [id number]  CC:  Chief Complaint  Patient presents with   New Patient (Initial Visit)   Hypertension   Diabetes     Assessment & Plan:   Dementia without behavioral disturbance, psychotic disturbance, mood disturbance, or anxiety, unspecified dementia severity, unspecified dementia type (HCC) -     Ambulatory referral to Neurology  Lumbar facet arthropathy -     tiZANidine  HCl; Take 1 tablet (4 mg total) by mouth at bedtime.  Dispense: 30 tablet; Refill: 0  Lumbar degenerative disc disease Assessment & Plan: She is on chronic opioid managed by Oncologist. She also takes Zanaflex , refill sent per request.  Orders: -     tiZANidine  HCl; Take 1 tablet (4 mg total) by mouth at bedtime.  Dispense: 30 tablet; Refill: 0  Stage 3b chronic kidney disease [facility]) Assessment & Plan: Nephrology referral placed.   Orders: -     Ambulatory referral to Nephrology  Type 2 diabetes mellitus without complication, without long-term current use of insulin (HCC) Assessment & Plan: Lab Results  Component Value Date   HGBA1C 5.9 (A) 08/16/2023   Discontinue Metformin  since her A1C is over corrected and due to CKD stage 3 b, will recommend Diet control only. Monitor and follow up 3 months.  Orders: -     POCT glycosylated hemoglobin (Hb A1C)  Pedal edema  Hyperlipidemia due to type 2 diabetes mellitus (HCC) Assessment & Plan: Continue Zocor  20 mg every day.       Return in about 3 months (around 11/16/2023) for TOC visit..   April K Jarissa Sheriff, MD   83 year old female with PMH as listed below presents along with her daughter in law [place] for transfer of care visit. Patient is tranferring to me, since Dr.Jones retired.   Concerns:  B/l pedal edema.  Anal SCC and Head and Neck cancer, Thyroid  nodule. Completed  Cisplatin  and radiation, has follow up in July.  Follows  Oncology.  Chronic pain due to [facility]. On chronic opioid  DVT: Left lower extremity  On DOAC eliquis and aspirin  Diabetes type 2 :  Metformin    HLD:  On simvastatin   Chronic low back pain:   Zanaflex        April [name] presents to establish care   Outpatient Encounter Medications as of 08/16/2023  Medication Sig   amLODipine (NORVASC) 10 MG tablet Take 10 mg by mouth daily.   apixaban (ELIQUIS) 2.5 MG TABS tablet Take 2.5 mg by mouth.   aspirin 81 MG tablet Take 1 tablet by mouth daily.   carvedilol  (COREG ) 3.125 MG tablet Take 1 tablet by mouth 2 (two) times daily. cardio   colchicine  0.6 MG tablet Take by mouth.   dexamethasone  (DECADRON ) 2 MG tablet Take 1 tablet (2 mg total) by mouth 2 (two) times daily with a meal.   diphenoxylate -atropine  (LOMOTIL ) 2.5-0.025 MG tablet Take 1 tablet by mouth 4 (four) times daily as needed for diarrhea or loose stools.   magic mouthwash (multi-ingredient) oral suspension Swish and swallow 5-10 mLs 4 (four) times daily as needed.   oxycodone  (OXY-IR) 5 MG capsule Take 5 mg by mouth every 4 (four) hours as needed for pain.   simvastatin  (ZOCOR ) 20 MG tablet Take 20 mg by mouth daily.   [DISCONTINUED] metFORMIN  (GLUCOPHAGE ) 500 MG tablet One every morning   [DISCONTINUED] tiZANidine  (ZANAFLEX )  4 MG tablet Take 1 tablet (4 mg total) by mouth at bedtime.   lisinopril -hydrochlorothiazide  (ZESTORETIC ) 20-25 MG tablet Take 1 tablet by mouth daily. (Patient not taking: Reported on 08/16/2023)   tiZANidine  (ZANAFLEX ) 4 MG tablet Take 1 tablet (4 mg total) by mouth at bedtime.   Facility-Administered Encounter Medications as of 08/16/2023  Medication   0.9 %  sodium chloride  infusion   morphine  (PF) 2 MG/ML injection      Review of Systems  All other systems reviewed and are negative.       Objective    BP (!) 140/70   Pulse 88   Ht 5' 6 (1.676 m)   Wt 116 lb (52.6 kg)   SpO2 96%   BMI 18.72 kg/m   Physical Exam Vitals and  nursing note reviewed.  Constitutional:      Appearance: Normal appearance.  HENT:     Head: Normocephalic.     Right Ear: External ear normal.     Left Ear: External ear normal.  Eyes:     Conjunctiva/sclera: Conjunctivae normal.  Cardiovascular:     Rate and Rhythm: Normal rate.  Pulmonary:     Effort: Pulmonary effort is normal. No respiratory distress.  Abdominal:     Palpations: Abdomen is soft.  Musculoskeletal:        General: Normal range of motion.     Right lower leg: Edema present.     Left lower leg: Edema present.  Skin:    General: Skin is warm.  Neurological:     Mental Status: She is alert and oriented to person, place, and time.  Psychiatric:        Mood and Affect: Mood normal.

## 2023-08-16 NOTE — Patient Instructions (Signed)
 Recommend salt reduction, compression stcoking and foot elevation at bed time.  Stop Metformin , her A1c is 5.9 which means her diabetes has reversed we will monitor her BS. Recommend eating healthy low carb diet.

## 2023-08-16 NOTE — Assessment & Plan Note (Signed)
 Nephrology referral placed

## 2023-08-17 ENCOUNTER — Other Ambulatory Visit: Payer: Self-pay

## 2023-08-22 ENCOUNTER — Encounter: Payer: Self-pay | Admitting: Oncology

## 2023-08-22 ENCOUNTER — Other Ambulatory Visit: Payer: Self-pay | Admitting: *Deleted

## 2023-08-22 MED ORDER — OXYCODONE HCL 5 MG PO CAPS: 5.0000 mg | ORAL_CAPSULE | ORAL | 0 refills | Status: DC | PRN

## 2023-08-23 ENCOUNTER — Encounter: Payer: Self-pay | Admitting: Oncology

## 2023-08-23 ENCOUNTER — Other Ambulatory Visit: Payer: Self-pay | Admitting: *Deleted

## 2023-08-23 ENCOUNTER — Other Ambulatory Visit: Payer: Self-pay

## 2023-08-23 MED ORDER — STERILE WATER FOR INJECTION IJ SOLN
5.0000 mL | Freq: Four times a day (QID) | OROMUCOSAL | 3 refills | Status: DC | PRN
  Filled 2023-08-23: qty 480, 12d supply, fill #0

## 2023-08-23 MED ORDER — DIPHENOXYLATE-ATROPINE 2.5-0.025 MG PO TABS
1.0000 | ORAL_TABLET | Freq: Four times a day (QID) | ORAL | 0 refills | Status: DC | PRN
  Filled 2023-08-23: qty 30, 8d supply, fill #0

## 2023-08-23 MED ORDER — OXYCODONE HCL 5 MG PO TABS
5.0000 mg | ORAL_TABLET | ORAL | 0 refills | Status: DC | PRN
  Filled 2023-08-23: qty 30, 5d supply, fill #0

## 2023-08-23 MED ORDER — APIXABAN 2.5 MG PO TABS
2.5000 mg | ORAL_TABLET | Freq: Two times a day (BID) | ORAL | 1 refills | Status: DC
  Filled 2023-08-23: qty 60, 30d supply, fill #0
  Filled 2023-09-27: qty 60, 30d supply, fill #1

## 2023-08-23 MED ORDER — DEXAMETHASONE 2 MG PO TABS
2.0000 mg | ORAL_TABLET | Freq: Two times a day (BID) | ORAL | 0 refills | Status: DC
  Filled 2023-08-23: qty 45, 23d supply, fill #0

## 2023-09-01 ENCOUNTER — Ambulatory Visit: Attending: Radiation Oncology | Admitting: Radiation Oncology

## 2023-09-05 ENCOUNTER — Other Ambulatory Visit: Payer: Self-pay

## 2023-09-05 ENCOUNTER — Telehealth: Payer: Self-pay | Admitting: *Deleted

## 2023-09-05 ENCOUNTER — Other Ambulatory Visit: Payer: Self-pay | Admitting: *Deleted

## 2023-09-05 MED ORDER — OXYCODONE HCL 5 MG PO TABS
5.0000 mg | ORAL_TABLET | ORAL | 0 refills | Status: DC | PRN
  Filled 2023-09-05: qty 30, 5d supply, fill #0

## 2023-09-05 MED ORDER — OXYCODONE HCL 5 MG PO TABS: 5.0000 mg | ORAL_TABLET | ORAL | 0 refills | Status: DC | PRN

## 2023-09-05 NOTE — Telephone Encounter (Signed)
 To the son and they will get the oxycodone  and she will be seeing Dr. [name] tomorrow.  He is good with that

## 2023-09-05 NOTE — Telephone Encounter (Signed)
 April [name] called for his mother and she is having still really bad pain in her head-she has right tonsil squamous cancer.  Sidra says that we will give a refill of the oxycodone  and they will be seeing Dr. Georgina again tomorrow,

## 2023-09-06 ENCOUNTER — Inpatient Hospital Stay: Attending: Oncology

## 2023-09-06 ENCOUNTER — Inpatient Hospital Stay

## 2023-09-06 ENCOUNTER — Inpatient Hospital Stay: Admitting: Oncology

## 2023-09-06 ENCOUNTER — Inpatient Hospital Stay (HOSPITAL_BASED_OUTPATIENT_CLINIC_OR_DEPARTMENT_OTHER): Admitting: Hospice and Palliative Medicine

## 2023-09-06 ENCOUNTER — Ambulatory Visit: Admission: RE | Admit: 2023-09-06 | Source: Ambulatory Visit | Admitting: Radiation Oncology

## 2023-09-06 ENCOUNTER — Encounter: Payer: Self-pay | Admitting: Oncology

## 2023-09-06 VITALS — BP 140/45 | HR 57 | Temp 97.8°F | Resp 16 | Wt 108.0 lb

## 2023-09-06 DIAGNOSIS — R634 Abnormal weight loss: Secondary | ICD-10-CM | POA: Insufficient documentation

## 2023-09-06 DIAGNOSIS — D649 Anemia, unspecified: Secondary | ICD-10-CM

## 2023-09-06 DIAGNOSIS — Z95828 Presence of other vascular implants and grafts: Secondary | ICD-10-CM

## 2023-09-06 DIAGNOSIS — C210 Malignant neoplasm of anus, unspecified: Secondary | ICD-10-CM | POA: Insufficient documentation

## 2023-09-06 DIAGNOSIS — Z515 Encounter for palliative care: Secondary | ICD-10-CM

## 2023-09-06 DIAGNOSIS — R5383 Other fatigue: Secondary | ICD-10-CM | POA: Insufficient documentation

## 2023-09-06 DIAGNOSIS — E041 Nontoxic single thyroid nodule: Secondary | ICD-10-CM | POA: Insufficient documentation

## 2023-09-06 DIAGNOSIS — Z87891 Personal history of nicotine dependence: Secondary | ICD-10-CM | POA: Insufficient documentation

## 2023-09-06 DIAGNOSIS — C109 Malignant neoplasm of oropharynx, unspecified: Secondary | ICD-10-CM

## 2023-09-06 DIAGNOSIS — D72819 Decreased white blood cell count, unspecified: Secondary | ICD-10-CM | POA: Insufficient documentation

## 2023-09-06 DIAGNOSIS — R1310 Dysphagia, unspecified: Secondary | ICD-10-CM | POA: Insufficient documentation

## 2023-09-06 DIAGNOSIS — R530 Neoplastic (malignant) related fatigue: Secondary | ICD-10-CM | POA: Diagnosis not present

## 2023-09-06 DIAGNOSIS — G893 Neoplasm related pain (acute) (chronic): Secondary | ICD-10-CM | POA: Diagnosis not present

## 2023-09-06 DIAGNOSIS — R630 Anorexia: Secondary | ICD-10-CM | POA: Diagnosis not present

## 2023-09-06 DIAGNOSIS — R609 Edema, unspecified: Secondary | ICD-10-CM | POA: Diagnosis not present

## 2023-09-06 DIAGNOSIS — N289 Disorder of kidney and ureter, unspecified: Secondary | ICD-10-CM | POA: Insufficient documentation

## 2023-09-06 DIAGNOSIS — M542 Cervicalgia: Secondary | ICD-10-CM | POA: Diagnosis not present

## 2023-09-06 DIAGNOSIS — C099 Malignant neoplasm of tonsil, unspecified: Secondary | ICD-10-CM | POA: Insufficient documentation

## 2023-09-06 LAB — SAMPLE TO BLOOD BANK

## 2023-09-06 LAB — CBC (CANCER CENTER ONLY)
HCT: 24.6 % — ABNORMAL LOW (ref 36.0–46.0)
Hemoglobin: 7.9 g/dL — ABNORMAL LOW (ref 12.0–15.0)
MCH: 30 pg (ref 26.0–34.0)
MCHC: 32.1 g/dL (ref 30.0–36.0)
MCV: 93.5 fL (ref 80.0–100.0)
Platelet Count: 125 K/uL — ABNORMAL LOW (ref 150–400)
RBC: 2.63 MIL/uL — ABNORMAL LOW (ref 3.87–5.11)
RDW: 16.7 % — ABNORMAL HIGH (ref 11.5–15.5)
WBC Count: 3.7 K/uL — ABNORMAL LOW (ref 4.0–10.5)
nRBC: 0 % (ref 0.0–0.2)

## 2023-09-06 MED ORDER — SODIUM CHLORIDE 0.9% FLUSH
10.0000 mL | INTRAVENOUS | Status: DC | PRN
  Administered 2023-09-06: 10 mL via INTRAVENOUS
  Filled 2023-09-06: qty 10

## 2023-09-06 MED ORDER — HEPARIN SOD (PORK) LOCK FLUSH 100 UNIT/ML IV SOLN
500.0000 [IU] | Freq: Once | INTRAVENOUS | Status: AC
  Administered 2023-09-06: 500 [IU] via INTRAVENOUS
  Filled 2023-09-06: qty 5

## 2023-09-06 MED ORDER — OXYCODONE HCL ER 10 MG PO T12A: 10.0000 mg | EXTENDED_RELEASE_TABLET | Freq: Two times a day (BID) | ORAL | 0 refills | Status: DC

## 2023-09-06 MED ORDER — NALOXONE HCL 4 MG/0.1ML NA LIQD: NASAL | 0 refills | Status: DC

## 2023-09-06 MED ORDER — SUCRALFATE 1 GM/10ML PO SUSP: 1.0000 g | Freq: Three times a day (TID) | ORAL | 0 refills | Status: DC

## 2023-09-06 NOTE — Progress Notes (Signed)
 Palliative Medicine [facility] at [facility] Telephone:(336) [phone] Fax:(336) [phone]   Name: April [name] Date: 09/06/2023 MRN: [id number]  DOB: 11-24-1940  Patient Care Team: Kotturi, Vinay K, MD as PCP - General (Family Medicine) Cherilyn Debby LITTIE, MD as Consulting Physician (Internal Medicine) Maurie Rayfield BIRCH, RN as Oncology Nurse Navigator [name], [name], MD as Consulting Physician (Oncology) [name], MD as Consulting Physician (Radiation Oncology)    REASON FOR CONSULTATION: GLADIES SOFRANKO is a 83 y.o. 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  Patient is referred to palliative care to address goals and manage ongoing symptoms.  SOCIAL HISTORY:     reports that she quit smoking about 35 years ago. Her smoking use included cigarettes. She started smoking about 43 years ago. She has a 4 pack-year smoking history. She has been exposed to tobacco smoke. She has never used smokeless tobacco. She reports that she does not drink alcohol and does not use drugs.  ADVANCE DIRECTIVES:    CODE STATUS:   PAST MEDICAL HISTORY: Past Medical History:  Diagnosis Date   Anemia    Diabetes mellitus without complication (HCC)    Gout    Hyperlipidemia    Hypertension    Wears dentures    full upper and lower    PAST SURGICAL HISTORY:  Past Surgical History:  Procedure Laterality Date   APPENDECTOMY     gallstones  06/12/2009   MICROLARYNGOSCOPY Right 01/28/2023   Procedure: MICRODIRECT LARYNGOSCOPY WITH BIOPSY OF OROPHARYNGEAL MASS;  Surgeon: Herminio Miu, MD;  Location: [facility];  Service: ENT;  Laterality: Right;  Diabetic   PORTACATH PLACEMENT N/A 02/18/2023   Procedure: INSERTION PORT-A-CATH;  Surgeon: Lane Shope, MD;  Location: [facility];  Service: General;  Laterality: N/A;   TONSILLECTOMY       HEMATOLOGY/ONCOLOGY HISTORY:  Oncology History  Anal squamous cell carcinoma (HCC)  01/18/2023 Initial Diagnosis   Anal squamous cell carcinoma (HCC)   01/18/2023 Cancer Staging   Staging form: Anus, AJCC V9 - Clinical stage from 01/18/2023: Stage IIIA (cT3, cN0, cM0) - Signed by [name] [name], MD on 01/18/2023 Stage prefix: Initial diagnosis   02/07/2023 - 03/07/2023 Chemotherapy   Patient is on Treatment Plan : ANUS Mitomycin  D1,29 + Capecitabine  + XRT     Right tonsillar squamous cell carcinoma (HCC)  04/12/2023 Initial Diagnosis   Right tonsillar squamous cell carcinoma (HCC)   04/12/2023 Cancer Staging   Staging form: Pharynx - P16 Negative Oropharynx, AJCC 8th Edition - Clinical stage from 04/12/2023: Stage III (cT3, cN1, cM0, p16: Unknown) - Signed by [name] [name], MD on 04/12/2023 Stage prefix: Initial diagnosis   05/05/2023 -  Chemotherapy   Patient is on Treatment Plan : HEAD/NECK Cisplatin  (40) q7d       ALLERGIES:  has no known allergies.  MEDICATIONS:  Current Outpatient Medications  Medication Sig Dispense Refill   amLODipine (NORVASC) 10 MG tablet Take 10 mg by mouth daily.     apixaban  (ELIQUIS ) 2.5 MG TABS tablet Take 1 tablet (2.5 mg total) by mouth 2 (two) times daily. 60 tablet 1   aspirin 81 MG tablet Take 1 tablet by mouth daily.     carvedilol  (COREG ) 3.125 MG tablet Take 1 tablet by mouth 2 (two) times daily. cardio     colchicine  0.6 MG tablet Take by mouth.  dexamethasone  (DECADRON ) 2 MG tablet Take 1 tablet (2 mg total) by mouth 2 (two) times daily with a meal. 45 tablet 0   diphenoxylate -atropine  (LOMOTIL ) 2.5-0.025 MG tablet Take 1 tablet by mouth 4 (four) times daily as needed for diarrhea or loose stools. 30 tablet 0   lisinopril -hydrochlorothiazide  (ZESTORETIC ) 20-25 MG tablet Take 1 tablet by mouth daily. (Patient not taking: Reported on 09/06/2023)     magic mouthwash (multi-ingredient) oral suspension Swish and swallow 5-10 mLs 4  (four) times daily as needed. 480 mL 3   oxyCODONE  (OXY IR/ROXICODONE ) 5 MG immediate release tablet Take 1 tablet (5 mg total) by mouth every 4 (four) hours as needed for severe pain (pain score 7-10). 30 tablet 0   simvastatin  (ZOCOR ) 20 MG tablet Take 20 mg by mouth daily.     tiZANidine  (ZANAFLEX ) 4 MG tablet Take 1 tablet (4 mg total) by mouth at bedtime. 30 tablet 0   No current facility-administered medications for this visit.   Facility-Administered Medications Ordered in Other Visits  Medication Dose Route Frequency Provider Last Rate Last Admin   0.9 %  sodium chloride  infusion   Intravenous Continuous Finnegan, Timothy J, MD   Stopped at 05/13/23 1535   morphine  (PF) 2 MG/ML injection             VITAL SIGNS: There were no vitals taken for this visit. There were no vitals filed for this visit.  Estimated body mass index is 17.43 kg/m as calculated from the following:   Height as of 08/16/23: 5' 6 (1.676 m).   Weight as of an earlier encounter on 09/06/23: 108 lb (49 kg).  LABS: CBC:    Component Value Date/Time   WBC 3.7 (L) 09/06/2023 1304   WBC 3.6 (L) 08/04/2023 0949   HGB 7.9 (L) 09/06/2023 1304   HGB 8.4 (L) 01/04/2023 1205   HCT 24.6 (L) 09/06/2023 1304   HCT 27.1 (L) 01/04/2023 1205   PLT 125 (L) 09/06/2023 1304   PLT 222 01/04/2023 1205   MCV 93.5 09/06/2023 1304   MCV 87 01/04/2023 1205   NEUTROABS 3.1 08/04/2023 0949   NEUTROABS 3.4 01/04/2023 1205   LYMPHSABS 0.2 (L) 08/04/2023 0949   LYMPHSABS 2.0 01/04/2023 1205   MONOABS 0.3 08/04/2023 0949   EOSABS 0.0 08/04/2023 0949   EOSABS 0.2 01/04/2023 1205   BASOSABS 0.0 08/04/2023 0949   BASOSABS 0.0 01/04/2023 1205   Comprehensive Metabolic Panel:    Component Value Date/Time   NA 136 08/04/2023 0949   NA 142 05/23/2023 1115   K 4.2 08/04/2023 0949   CL 103 08/04/2023 0949   CO2 24 08/04/2023 0949   BUN 34 (H) 08/04/2023 0949   BUN 9 05/23/2023 1115   CREATININE 1.33 (H) 08/04/2023 0949    GLUCOSE 98 08/04/2023 0949   CALCIUM 8.7 (L) 08/04/2023 0949   AST 16 08/04/2023 0949   ALT 10 08/04/2023 0949   ALKPHOS 49 08/04/2023 0949   BILITOT 0.8 08/04/2023 0949   PROT 6.3 (L) 08/04/2023 0949   PROT 7.0 06/01/2022 1017   ALBUMIN 3.3 (L) 08/04/2023 0949   ALBUMIN 3.5 (L) 05/23/2023 1115    RADIOGRAPHIC STUDIES: No results found.  PERFORMANCE STATUS (ECOG) : 2 - Symptomatic, <50% confined to bed  Review of Systems Unless otherwise noted, a complete review of systems is negative.  Physical Exam General: NAD Cardiovascular: regular rate and rhythm Pulmonary: clear ant fields Abdomen: soft, nontender, + bowel sounds GU: no suprapubic tenderness Extremities: no  edema, no joint deformities Skin: no rashes Neurological: Weakness but otherwise nonfocal  IMPRESSION: Patient was an add-on to my clinic schedule today to address pain.  Patient recently completed XRT to the right tonsil on August 04, 2023.  Patient has had residual pain to that area without swelling.  She has had difficulty swallowing due to pain.  Patient has had weight loss.  Patient states that she is taking oxycodone , which improves the pain.  However, pain is often wearing off prior to the next dose being due.  Patient states that she is taking oxycodone  every 4 hours around-the-clock.  Patient and family are interested in trial of a long-acting opioid.  No oral lesions noted.  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  PLAN: - Continue current scope of treatment - Start OxyContin  10 mg every 12 hours - Naloxone  - PDMP reviewed - Daily bowel regimen prevent opioid-induced constipation - Start sucralfate  - Follow-up 2 weeks  Case and plan discussed with Dr. [name]  Patient expressed understanding and was in agreement with this plan. She also understands that She can call the clinic at any time with any questions, concerns, or complaints.      Time Total: 15 minutes  Visit consisted of counseling and education dealing with the complex and emotionally intense issues of symptom management and palliative care in the setting of serious and potentially life-threatening illness.Greater than 50%  of this time was spent counseling and coordinating care related to the above assessment and plan.  Signed by: Fonda Mower, PhD, NP-C

## 2023-09-06 NOTE — Patient Instructions (Signed)

## 2023-09-06 NOTE — Progress Notes (Unsigned)
 Patient has to were she doesn't want to do anything. She isn't eating anymore either. Her pain is severe and nothing she is taking is helping her.

## 2023-09-06 NOTE — Progress Notes (Unsigned)
 [facility]  Telephone:(336) [phone] Fax:(336) [phone]  ID: April [name] OB: [birth year]  MR#: [id number]  RDW#:[id number]  Patient Care Team: Kotturi, Vinay K, MD as PCP - General (Family Medicine) April Debby LITTIE, MD as Consulting Physician (Internal Medicine) April Rayfield BIRCH, RN as Oncology Nurse Navigator [name], [name], MD as Consulting Physician (Oncology) [name], MD as Consulting Physician (Radiation Oncology)  CHIEF COMPLAINT: Stage IIIa squamous cell carcinoma of the anus.  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  INTERVAL HISTORY: Patient returns to clinic today for repeat laboratory work and further evaluation.  She continues to have significant neck pain with dysphagia.  She has a poor appetite and weight loss.  She also has increased weakness and fatigue and a declining performance status.  She continues to have peripheral edema.  She has no neurologic complaints.  She denies any recent fevers or illnesses.  She has no chest pain, shortness of breath, cough, or hemoptysis.  She denies any nausea, vomiting, constipation, or diarrhea.  She has no melena or hematochezia.  She has no urinary complaints.  Patient offers no further specific complaints today.  REVIEW OF SYSTEMS:   Review of Systems  Constitutional:  Positive for malaise/fatigue. Negative for fever and weight loss.  HENT:  Positive for sore throat.   Respiratory: Negative.  Negative for cough, hemoptysis and shortness of breath.   Cardiovascular:  Positive for leg swelling. Negative for chest pain.  Gastrointestinal: Negative.  Negative for abdominal pain, blood in stool, diarrhea and melena.  Genitourinary: Negative.  Negative for dysuria.  Musculoskeletal:  Positive for neck pain. Negative for back pain.  Skin: Negative.  Negative for rash.  Neurological:  Positive for weakness. Negative for dizziness, focal weakness and headaches.  Psychiatric/Behavioral:  Negative.  The patient is not nervous/anxious.    As per HPI. Otherwise, a complete review of systems is negative.  PAST MEDICAL HISTORY: Past Medical History:  Diagnosis Date   Anemia    Diabetes mellitus without complication (HCC)    Gout    Hyperlipidemia    Hypertension    Wears dentures    full upper and lower    PAST SURGICAL HISTORY: Past Surgical History:  Procedure Laterality Date   APPENDECTOMY     gallstones  06/12/2009   MICROLARYNGOSCOPY Right 01/28/2023   Procedure: MICRODIRECT LARYNGOSCOPY WITH BIOPSY OF OROPHARYNGEAL MASS;  Surgeon: Herminio Miu, MD;  Location: [facility];  Service: ENT;  Laterality: Right;  Diabetic   PORTACATH PLACEMENT N/A 02/18/2023   Procedure: INSERTION PORT-A-CATH;  Surgeon: Lane Shope, MD;  Location: [facility];  Service: General;  Laterality: N/A;   TONSILLECTOMY      FAMILY HISTORY: Family History  Problem Relation Age of Onset   Breast cancer Neg Hx     ADVANCED DIRECTIVES (Y/N):  N  HEALTH MAINTENANCE: Social History   Tobacco Use   Smoking status: Former    Current packs/day: 0.00    Average packs/day: 0.5 packs/day for 8.0 years (4.0 ttl pk-yrs)    Types: Cigarettes    Start date: 36    Quit date: 1990    Years since quitting: 35.6    Passive exposure: Past   Smokeless tobacco: Never  Vaping Use   Vaping status: Never Used  Substance Use Topics   Alcohol use: No    Alcohol/week: 0.0 standard drinks of alcohol   Drug use: No     Colonoscopy:  PAP:  Bone density:  Lipid panel:  No Known Allergies  Current Outpatient Medications  Medication Sig Dispense Refill   amLODipine (NORVASC) 10 MG tablet Take 10 mg by mouth daily.     apixaban  (ELIQUIS ) 2.5 MG TABS tablet Take 1 tablet (2.5 mg total) by mouth 2 (two) times daily. 60 tablet 1   aspirin 81 MG tablet Take 1 tablet by mouth daily.     carvedilol  (COREG ) 3.125 MG tablet Take 1 tablet by mouth 2 (two) times daily. cardio      colchicine  0.6 MG tablet Take by mouth.     dexamethasone  (DECADRON ) 2 MG tablet Take 1 tablet (2 mg total) by mouth 2 (two) times daily with a meal. 45 tablet 0   diphenoxylate -atropine  (LOMOTIL ) 2.5-0.025 MG tablet Take 1 tablet by mouth 4 (four) times daily as needed for diarrhea or loose stools. 30 tablet 0   magic mouthwash (multi-ingredient) oral suspension Swish and swallow 5-10 mLs 4 (four) times daily as needed. 480 mL 3   oxyCODONE  (OXY IR/ROXICODONE ) 5 MG immediate release tablet Take 1 tablet (5 mg total) by mouth every 4 (four) hours as needed for severe pain (pain score 7-10). 30 tablet 0   simvastatin  (ZOCOR ) 20 MG tablet Take 20 mg by mouth daily.     tiZANidine  (ZANAFLEX ) 4 MG tablet Take 1 tablet (4 mg total) by mouth at bedtime. 30 tablet 0   lisinopril -hydrochlorothiazide  (ZESTORETIC ) 20-25 MG tablet Take 1 tablet by mouth daily. (Patient not taking: Reported on 09/06/2023)     naloxone  (NARCAN ) nasal spray 4 mg/0.1 mL SPRAY 1 SPRAY INTO ONE NOSTRIL AS DIRECTED FOR OPIOID OVERDOSE (TURN PERSON ON SIDE AFTER DOSE. IF NO RESPONSE IN 2-3 MINUTES OR PERSON RESPONDS BUT RELAPSES, REPEAT USING A NEW SPRAY DEVICE AND SPRAY INTO THE OTHER NOSTRIL. CALL 911 AFTER USE.) * EMERGENCY USE ONLY * 1 each 0   oxyCODONE  (OXYCONTIN ) 10 mg 12 hr tablet Take 1 tablet (10 mg total) by mouth every 12 (twelve) hours. 30 tablet 0   sucralfate  (CARAFATE ) 1 GM/10ML suspension Take 10 mLs (1 g total) by mouth 4 (four) times daily -  with meals and at bedtime. 420 mL 0   No current facility-administered medications for this visit.   Facility-Administered Medications Ordered in Other Visits  Medication Dose Route Frequency Provider Last Rate Last Admin   0.9 %  sodium chloride  infusion   Intravenous Continuous [name] [name], MD   Stopped at 05/13/23 1535   morphine  (PF) 2 MG/ML injection             OBJECTIVE: Vitals:   09/06/23 1404  BP: (!) 140/45  Pulse: (!) 57  Resp: 16  Temp: 97.8 F (36.6  C)  SpO2: 100%       Body mass index is 17.43 kg/m.    ECOG FS:3 - Symptomatic, >50% confined to bed  General: Well-developed, well-nourished, no acute distress.  Sitting in a wheelchair. Eyes: Pink conjunctiva, anicteric sclera. HEENT: Normocephalic, moist mucous membranes.  No palpable lymphadenopathy. Lungs: No audible wheezing or coughing. Heart: Regular rate and rhythm. Abdomen: Soft, nontender, no obvious distention. Musculoskeletal: No edema, cyanosis, or clubbing. Neuro: Alert, answering all questions appropriately. Cranial nerves grossly intact. Skin: No rashes or petechiae noted. Psych: Normal affect.   LAB RESULTS:  Lab Results  Component Value Date   NA 136 08/04/2023   K 4.2 08/04/2023   CL 103 08/04/2023   CO2 24 08/04/2023   GLUCOSE 98 08/04/2023   BUN 34 (H) 08/04/2023  CREATININE 1.33 (H) 08/04/2023   CALCIUM 8.7 (L) 08/04/2023   PROT 6.3 (L) 08/04/2023   ALBUMIN 3.3 (L) 08/04/2023   AST 16 08/04/2023   ALT 10 08/04/2023   ALKPHOS 49 08/04/2023   BILITOT 0.8 08/04/2023   GFRNONAA 39 (L) 08/04/2023   GFRAA 59 (L) 09/24/2019    Lab Results  Component Value Date   WBC 3.7 (L) 09/06/2023   NEUTROABS 3.1 08/04/2023   HGB 7.9 (L) 09/06/2023   HCT 24.6 (L) 09/06/2023   MCV 93.5 09/06/2023   PLT 125 (L) 09/06/2023     STUDIES: No results found.   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  Patient has now completed concurrent XRT along with chemotherapy.  Her last dose of mitomycin  was on March 07, 2023.  She finished Xeloda  and XRT on March 25, 2023.  Repeat PET scan results from April 07, 2023 reviewed independently with persistent  hypermetabolism at the site of her anal cancer, but PET scan was done sooner than required given her head and neck cancer and this may be residual inflammation/irritation from XRT.  Patient will require repeat surgical evaluation in the near future.   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  After multiple delays, patient finally received her seventh and final treatment of dose reduced cisplatin  on July 28, 2023.  She completed XRT on August 04, 2023.  Will reimage with PET scan in approximately September 2025.  Patient will also require follow-up with ENT in the near future. Thyroid  nodule with hypermetabolism: Defer ultrasound and biopsy until completion of treatment of 2 malignancies above. Anemia: Hemoglobin is trended down to 7.9.  Patient may require transfusion in the near future. Leukopenia: Chronic and unchanged.  Patient's total white blood cell count is 3.7. Thrombocytopenia: Platelet count improved to 125.  From the Hypomagnesia: Chronic and unchanged.  Patient's magnesium  is stable at 1.6.  Monitor. Renal insufficiency: Chronic and unchanged.  Patient's GFR stable at 39. Pain: Poorly controlled.  OxyContin  10 mg every 12 hours in addition to oxycodone  5 mg every 4-6 hours as needed.  Appreciate palliative care input. Poor appetite/weight loss: Appreciate dietary input. Confusion: Patient does not complain of this today. Disposition: Return to clinic in 2 weeks for repeat laboratory work and further evaluation.   Patient expressed understanding and was in agreement with this plan. She also understands that She can call clinic at any time with any questions, concerns, or complaints.    Cancer Staging  Anal squamous cell carcinoma (HCC) Staging form: Anus, AJCC V9 - Clinical stage from 01/18/2023: Stage IIIA (cT3, cN0, cM0) - Signed by [name] [name], MD on 01/18/2023 Stage prefix: Initial diagnosis  Right tonsillar squamous cell carcinoma  (HCC) Staging form: Pharynx - P16 Negative Oropharynx, AJCC 8th Edition - Clinical stage from 04/12/2023: Stage III (cT3, cN1, cM0, p16: Unknown) - Signed by [name] [name], MD on 04/12/2023 Stage prefix: Initial diagnosis   [name] [name], MD   09/07/2023 8:40 PM

## 2023-09-06 NOTE — Progress Notes (Signed)
 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  Patient has completed treatment for anus cancer and head and neck cancer.   Met with patient following MD and NP.  Daughter in law with her during visit.  Appetite is poor.  Eating bites.  Has eaten 1/2 bowl of green beans so far today.  Yesterday ate 1 bite of spaghetti, 1/2 slice bread and bite of salad for supper.  Had fish recently and ate 1 bite of fish and fries.  Daughter in law reports that she is not drinking much fluids (yesterday drank [place]).  Not drinking glucerna because of diarrhea.  Patient reports pain when swallowing foods.  Says that food taste ok.  Denies stomach upset.  Last bowel movement was yesterday.  Feels weak.    Orbital Region: severe  Buccal Region: severe Upper Arm Region: moderate Thoracic and Lumbar Region: moderate Temple Region: severe Clavicle Bone Region: severe Shoulder and Acromion Bone Region: severe Scapular Bone Region: severe Dorsal Hand: moderate Patellar Region: severe Anterior Thigh Region: severe Posterior Calf Region: severe Edema (RD assessment): present Hair: observed Eyes: observed Mouth: observed Skin: observed Nails: observed  Medications: reviewed  Labs: reviewed  Anthropometrics:   Weight 108 lb today  116 lb on 7/8 121 lb on 6/19 135 lb (bilateral edema) 127 lb on 5/2 114 lb on 4/18 122 lb on 4/14 121 lb on 4/11  6% weight loss in the last 3 weeks, significant  NUTRITION DIAGNOSIS: Unintentional weight loss continues   MALNUTRITION DIAGNOSIS:  Meet criteria for severe malnutrition related to  6% weight loss in the last 3 weeks and eating less than 50% of estimated energy needs for > 5 days  INTERVENTION:  NP provided pain medication to help with pain on swallowing Encouraged soft foods with adding gravies, butter, sauces, etc) for calories and ease of swallowing Encouraged eating few bites q 1 hour Encouraged trying different variety  of shake ([employer]) to see if does not cause diarrhea     MONITORING, EVALUATION, GOAL: weight trends, intake   NEXT VISIT: Tuesday, August 14  Kristain Hu B. Dasie SOLON, CSO, LDN Registered Dietitian [phone]

## 2023-09-07 ENCOUNTER — Encounter: Payer: Self-pay | Admitting: Oncology

## 2023-09-07 ENCOUNTER — Other Ambulatory Visit: Payer: Self-pay | Admitting: Nephrology

## 2023-09-07 ENCOUNTER — Ambulatory Visit: Admitting: Radiation Oncology

## 2023-09-07 DIAGNOSIS — E1122 Type 2 diabetes mellitus with diabetic chronic kidney disease: Secondary | ICD-10-CM

## 2023-09-07 DIAGNOSIS — N1832 Chronic kidney disease, stage 3b: Secondary | ICD-10-CM

## 2023-09-08 ENCOUNTER — Telehealth: Payer: Self-pay

## 2023-09-08 ENCOUNTER — Encounter: Payer: Self-pay | Admitting: Oncology

## 2023-09-08 NOTE — Telephone Encounter (Signed)
 PA denied from plan, required an expedited appeal that has been created by calling [employer]. Informed agent patient  unable to try other recommended drugs due to renal dysfunction and higher risk for neurotoxicity with other options.   Appeal takes up to 72 hours for review.  To follow up call [phone] Reference # [id number]

## 2023-09-08 NOTE — Telephone Encounter (Signed)
 PA initiated for Oxycontin  10 mg CR tablets via CoverMyMeds.   Xzb:[id number]

## 2023-09-09 ENCOUNTER — Encounter: Payer: Self-pay | Admitting: *Deleted

## 2023-09-12 ENCOUNTER — Other Ambulatory Visit: Payer: Self-pay

## 2023-09-13 ENCOUNTER — Encounter: Payer: Self-pay | Admitting: Oncology

## 2023-09-13 ENCOUNTER — Encounter: Payer: Self-pay | Admitting: Hospice and Palliative Medicine

## 2023-09-17 NOTE — Progress Notes (Signed)
 ------------------------------------------------------------------------------- Attestation signed by Dante Adrien Helling, MD at 09/17/23 1843 April [name] is currently being treated primarily for Confusion , acute  I saw and evaluated the patient, participating in the key portions of the service.  I reviewed the resident's note.  I agree with the resident's findings and plan.   Adrien LITTIE Dante, MD  -------------------------------------------------------------------------------  Geriatrics ([facility]) Progress Note  Assessment & Plan:  April [name] is a 83 y.o. female whose presentation is complicated by hypertension, hyperlipidemia, T2DM, anemia, oropharyngeal cancer, anal cancer, chronic pain, dementia that presented to [facility] with acute on chronic decrease in participatory capacity per caregivers at home.   Principal Problem:   Confusion Active Problems:   Chronic fatigue   Tonsillar cancer      Anal cancer      Diabetes      Hyperlipidemia   HTN (hypertension)   Active Problems  Acute on chronic normocytic anemia 8/9 hemoglobin decreased from 7.6-6.5, recheck H&H up to 7.3.  Unsure of etiology of decline.  Baseline hemoglobin around 8.  No evidence of bleeding on exam.  No melena.  No new symptoms.  No tachycardia, overall HDS.  Will continue to monitor H&H and will temporarily hold anticoagulation today and will plan to reassess tomorrow pending tomorrow morning labs.  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 - Hold Eliquis  - CBC in a.m. - Continue monitor for evidence of bleeding on exam - Monitor next BM for evidence of blood, no evidence of bleeding currently  Mental status changes  Hx of weight loss  Patient is alert this morning 8/9 and interactive on exam.  She denies any complaints.  She states that drinking fluids has been easier since using the Magic mouthwash.  Continuing to optimize nutrition. - C/w oxycodone  5 mg q 4h PRN -  Magic mouthwash, lidocaine  soln for esophageal pain - Ensure/Boost ordered - Nutrition consult - PT/OT - Remeron, melatonin - Daily CBC, BMP, Mg   Possible urinary retention 8/9 patient is voiding spontaneously, will continue bladder scans per shift - Bladder scans per shift  Hx of constipation Patient with history of constipation per daughter-in-law.  Bowel movement today 8/9.  Will continued scheduled senna and will back off MiraLAX to twice daily - Miralax twice daily from 3 times daily - Senna   Elevated Cr -- stable Possibility for prerenal AKI in the setting of poor p.o., however per chart review patient baseline elevated to 1.1-1.  Patient follows with nephrology outpatient.  8/9 creatinine stable at baseline. - Daily BMP  A-fib        Mentation CAM:   CAM: Yes 6CIT: 6CIT Total Score: 28 (Sep 15, 2023 1745 : Nelwyn Leek, RN) Not yet completed (please touch base with nursing) Delirium Order Set: Ordered (appropriate for any patient >65) Dementia: Yes. Behavioral Symptoms (baseline or now): Yes.  Mobility Baseline functional status: Needs Assistance with ADLs Current functional status: Dependent in ADLs PT/OT Ordered: Yes  Medications Reviewed home medications, screening for potentially inappropriate medications: Yes Medications recommended de-prescribing: pain medications, steroids as able  What Matters Geri Assessment complete: No, needs to be done      Chronic Problems HLD - Continue home pravastatin HTN - Continue home coreg , hold home amlodipine and lisinopril . Per son, recently dced hydrochlorothiazide . T2DM - Recently dced metformin . Will monitor BGs with accuchecks. No acute intervention currently required.  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  Will reinitiate pending stabilization of labs and no evidence of bleeding. - Hold Eliquis      The patient's presentation is  complicated by the following clinically significant conditions requiring additional evaluation and treatment: - Hypercoagulable state requiring additional attention to DVT prophylaxis and treatment or chronic anticoagulation secondary to Malignancy  - Altered mental status secondary to - Underlying dementia with acute change POA requiring further investigation or monitoring - Chronic kidney disease POA requiring further investigation, treatment, or monitoring  - Age related debility POA requiring additional resources: DME, PT, or OT    Checklist: Diet: Regular Diet  DVT PPx: Patient Already on Full Anticoagulation with Eliquis  Code Status: Full Code Dispo: Patient appropriate for  Inpatient based on expectation of ongoing need for hospitalization greater than two midnights and severity of presentation/services including delirium work up   Psychiatric nurse Information:  Primary Team: Geriatrics ([facility]) Primary Resident: Alan Mas, MD Resident's Pager: [phone] (Geriatrics Intern - Carolee)  Interval History:  No acute events overnight.  She was interactive this morning and denied any acute acute complaints.  She allowed for examination which was unremarkable.  She requested to be allowed to sleep.  She endorses tolerating fluids better.  Objective:  Temp:  [36.6 C (97.9 F)-37.6 C (99.7 F)] 37.1 C (98.7 F) Pulse:  [67-74] 74 Resp:  [16-20] 18 BP: (139-144)/(54-70) 140/70 SpO2:  [97 %-99 %] 98 %, No intake or output data in the 24 hours ending 09/17/23 1605,  Wt Readings from Last 3 Encounters:  09/16/23 53.7 kg (118 lb 4.8 oz)  07/02/23 61.2 kg (135 lb)    Gen: Lying in bed, awake to voice Eyes: sclera anicteric, EOMI HENT: atraumatic, MMM, OP w/o erythema or exudate  Heart: RRR, S1, S2, no M/R/G, no chest wall tenderness Lungs: Good air movement bilaterally, normal work of breathing Abdomen: Normoactive bowel sounds, soft, NTND, no rebound/guarding Extremities: no clubbing,  cyanosis, or edema in the BLEs Psych: Alert, oriented, appropriate mood and affect  Labs/Studies: Labs and Studies from the last 24hrs per EMR and Reviewed

## 2023-09-18 NOTE — Progress Notes (Signed)
 ------------------------------------------------------------------------------- Attestation signed by Dante Adrien Helling, MD at 09/18/23 1542 April [name] is currently being treated primarily for Confusion , acute  I saw and evaluated the patient, participating in the key portions of the service.  I reviewed the resident's note.  I agree with the resident's findings and plan.   Adrien LITTIE Dante, MD  -------------------------------------------------------------------------------  Geriatrics ([facility]) Progress Note  Assessment & Plan:  April [name] is a 83 y.o. female whose presentation is complicated by hypertension, hyperlipidemia, T2DM, anemia, oropharyngeal cancer, anal cancer, chronic pain, dementia that presented to [facility] with acute on chronic decrease in participatory capacity per caregivers at home.   Principal Problem:   Confusion Active Problems:   Chronic fatigue   Tonsillar cancer      Anal cancer      Diabetes      Hyperlipidemia   HTN (hypertension)   Active Problems  Acute on chronic normocytic anemia 8/9 hemoglobin decreased from 7.6-6.5, recheck H&H up to 7.3.  8/10 with Hgb 6.9. Unsure of etiology of changes.  Baseline hemoglobin around 8.  No evidence of bleeding on exam.  No melena.  No new symptoms.  No tachycardia, overall HDS.  Will continue to monitor H&H and will continue to hold anticoagulation today and will plan to reassess tomorrow w AM labs.  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 - Continue monitor for evidence of bleeding on exam  Mental status changes  Hx of weight loss  Limited appetite for breakfast 8/10 AM. With diarrhea (nonmelanotic) at lunch time. Suspect 2/2 new food, changes related to ABX. KUB ordered to evaluate for stool burden, negative. Low suspicion for impaction/encoparesis. Low suspicion for GI virus iso stable WBCs, no other  infectious signs/symptoms. Will continue to monitor and encourage PO iso ongoing optimization of nutrition. - C/w oxycodone  5 mg q 4h PRN - Magic mouthwash, lidocaine  soln for esophageal pain - Ensure/Boost ordered - Nutrition consult - PT/OT - Remeron, melatonin - Daily CBC, BMP, Mg   Possible urinary retention 8/9 patient is voiding spontaneously. Will dc bladder scans given limited ability to capture true PVRs. Can restart as clinically indicated.  Hx of constipation Patient with history of constipation per daughter-in-law.  Bowel movement today 8/10 as diarrhea.  Will transition to once daily miralax, senna to optimize stooling iso ongoing opioid use vs ongoing diarrhea. - Miralax daily PRN - Senna nightly PRN   Elevated Cr -- stable Possibility for prerenal AKI in the setting of poor p.o., however per chart review patient baseline elevated to 1.1-1.  Patient follows with nephrology outpatient.  8/10 at baseline - Daily BMP      Mentation CAM:   CAM: Yes 6CIT: 6CIT Total Score: 28 (Sep 15, 2023 1745 : Nelwyn Leek, RN) Not yet completed (please touch base with nursing) Delirium Order Set: Ordered (appropriate for any patient >65) Dementia: Yes. Behavioral Symptoms (baseline or now): Yes.  Mobility Baseline functional status: Needs Assistance with ADLs Current functional status: Dependent in ADLs PT/OT Ordered: Yes  Medications Reviewed home medications, screening for potentially inappropriate medications: Yes Medications recommended de-prescribing: pain medications, steroids as able  What Matters Geri Assessment complete: No, needs to be done      Chronic Problems HLD - Continue home pravastatin HTN - Continue home coreg , hold home amlodipine and lisinopril . Per son, recently dced hydrochlorothiazide . T2DM - Recently dced metformin . Will monitor  BGs with accuchecks. No acute intervention currently required.  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  Will reinitiate pending stabilization of labs and no evidence of bleeding. - Hold Eliquis    The patient's presentation is complicated by the following clinically significant conditions requiring additional evaluation and treatment: - Hypercoagulable state requiring additional attention to DVT prophylaxis and treatment or chronic anticoagulation secondary to Malignancy  - Altered mental status secondary to - Underlying dementia with acute change POA requiring further investigation or monitoring - Chronic kidney disease POA requiring further investigation, treatment, or monitoring  - Age related debility POA requiring additional resources: DME, PT, or OT    Checklist: Diet: Regular Diet  DVT PPx: Patient Already on Full Anticoagulation with Eliquis  Code Status: Full Code Dispo: Patient appropriate for  Inpatient based on expectation of ongoing need for hospitalization greater than two midnights and severity of presentation/services including delirium work up   Psychiatric nurse Information:  Primary Team: Geriatrics ([facility]) Primary Resident: Alan Mas, MD Resident's Pager: [phone] (Geriatrics Intern - Carolee)  Interval History:  No acute events overnight.  She was interactive this morning and noted limited appetite for breakfast. When seen and examined after diarrhea, noted unwillingness to continue current bowel regimen. Assured her of planned down titration of regimen.  Objective:  Temp:  [36.7 C (98.1 F)-37.1 C (98.8 F)] 36.7 C (98.1 F) Pulse:  [72-90] 90 Resp:  [18-20] 18 BP: (112-152)/(52-75) 112/75 SpO2:  [98 %-100 %] 98 %,  Intake/Output Summary (Last 24 hours) at 09/18/2023 1507 Last data filed at 09/18/2023 0530 Gross per 24 hour  Intake 100 ml  Output 300 ml  Net -200 ml  ,  Wt Readings from Last 3 Encounters:  09/18/23 54.8 kg (120 lb 12.8 oz)  07/02/23 61.2 kg (135 lb)    Gen: Lying in bed, awake to  voice Eyes: sclera anicteric, EOMI HENT: atraumatic, MMM, OP w/o erythema or exudate  Heart: RRR, S1, S2, no M/R/G, no chest wall tenderness Lungs: Good air movement bilaterally, normal work of breathing Abdomen: Normoactive bowel sounds, soft, non tender, slight distension LT > RT Extremities: no clubbing, cyanosis, or edema in the BLEs Psych: Alert, oriented, appropriate mood and affect  Labs/Studies: Labs and Studies from the last 24hrs per EMR and Reviewed

## 2023-09-18 NOTE — Care Plan (Signed)
 Referral documentation sent to SNFs within 40 miles radius of home zip code  via Epic. April [name] September 18, 2023 11:52 AM

## 2023-09-19 NOTE — Nursing Note (Signed)
   09/19/23 1451  Missed Session   Note Type Contact Note  Missed Therapy Reason Patient unwilling to participate (pt declined, RN notified)  PT Missed Minutes 0

## 2023-09-20 NOTE — Consults (Signed)
 Adult Nutrition Assessment Note  Visit Type: MD Consult Reason for Visit: Assessment (Nutrition) (Per provider (not enteral or an education consult))  NUTRITION INTERVENTIONS and RECOMMENDATION   Continue with current diet: House Continue Ensure Plus HP BID Monitor PO intake and record % eaten in [employer] intake: assist with meal ordering, tray set up, and feeding as needed  Weigh patient weekly  Continue to encourage hydration iso hypernatremia Agree to continue appetite stimulant  Agree with bowel regimen Consider adding a multivitamin with minerals  NUTRITION ASSESSMENT   Current  nutrition therapy is appropriate although not meeting nutritional  needs at this time due to decreased appetite. Patient would benefit from continuing oral supplement to better meet nutritional needs. Patient has noted hypernatremia.  20 lb weight loss in 3 months, 14.8% weight loss, clinically significant. Patient may benefit from a multivitamin with minerals given recent weight loss, poor nutrition status, and low iron levels.  NUTRITIONALLY RELEVANT DATA   HPI & PMH:  April [name] is a 83 y.o. female whose presentation is complicated by hypertension, hyperlipidemia, T2DM, anemia, oropharyngeal cancer, anal cancer, chronic pain, dementia that presented to [facility] with acute on chronic decrease in participatory capacity per caregivers at home.  Nutrition History:  RD met with patient and patient's family at bedside. Patient has had a decreased appetite for the past couple of months, per patient's daughter-in-law. Patient also had some swallowing issues due to throat pain. Patient was having constipation so miralax was given and patient has been having loose stools. Patient typically eats 1 to 2 meals and family continues to encourage patient to eat. Patient may have some lactose intolerance. She tried Ensure in the past but she could not tolerate it. RD discussed trying [employer] with lactose-free milk to provide more calories and protein.  Medications: Nutritionally pertinent medications reviewed and evaluated for potential food and/or medication interactions.  Include: remeron, miralax, senna  Labs:  Nutritionally pertinent labs reviewed and include Na: 146 mmol/L Lab Results  Component Value Date   IRON 29 (L) 09/16/2023   TIBC 118.4 (L) 09/16/2023   FERRITIN 1,981.2 (H) 09/15/2023    Lab Results  Component Value Date   VITAMINB12 4,097 (H) 09/18/2023    Lab Results  Component Value Date   FOLATE 7.3 09/16/2023     Nutritional Needs:  Healthy balance of carbohydrate, protein, and fat.   Anthropometric Data: Height: 167.6 cm (5' 5.98)  Admission weight: 55.8 kg (123 lb) Last recorded weight: 52.2 kg (115 lb) (09/20/23) IBW: 58.98 kg BMI: Body mass index is 18.57 kg/m.  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 Loss: > or equal to 7.5% x 3 month Fat Loss: Moderate Muscle Loss: Moderate Malnutrition Score: 4       Nutrition Focused Physical Exam: Nutrition Focused Physical Exam: Fat Areas Examined Orbital: Moderate loss Upper Arm: Moderate loss Thoracic: Severe loss    Muscle Areas Examined Temple: Moderate loss Clavicle: Severe loss Acromion: Moderate loss Scapular: Moderate loss Dorsal Hand: Moderate loss Patellar: Moderate loss  Anterior Thigh: Moderate loss Posterior Calf: Moderate loss        Nutrition  Evaluation Overall Impressions: Moderate fat loss, Moderate muscle loss (09/20/23 1330) Nutrition Designation: Normal weight (BMI 18.50 - 24.99 kg/m2) (09/20/23 1330)   Care plan: Completed  Current Nutrition: Oral intake  Nutrition Orders         Supplement Adult; Ensure Plus High Protein (High Calorie/High Protein); # of Products PER Serving: 2 Mid-morning starting at 08/08 0900   Nutrition Therapy Regular/House starting at 08/07 1456      Nutritionally Pertinent Allergies, Intolerances, Sensitivities, and/or Cultural/Religious Restrictions: none identified at this time   GOALS and EVALUATION   Patient to meet 75% or greater of nutritional needs via combination of meals, snacks, and/or oral supplements within hospital admission.  - New  Motivation, Barriers, and Compliance: Evaluation of motivation, barriers, and compliance completed and include confusion  Discharge Planning:  Monitor for potential discharge needs with multi-disciplinary team.   Follow-Up Parameters:  1-2 times per week (and more frequent as indicated)   Carrie C. Robynn, MPH, RD, LDN Pager: [phone]

## 2023-09-20 NOTE — Care Plan (Signed)
 Shift Summary Pain remained well controlled and comfort maintained throughout the shift.  Continued to require maximum assistance for hygiene and remained dependent for bathing and mobility.  Weight shift assistance and encouragement were provided regularly to support skin health.  Psychosocial status showed improvement in ability to express feelings and thoughts by evening, though affect was flat.  Overall, remained dependent for ADLs and required ongoing support for comfort and skin integrity.   Optimal Comfort and Wellbeing: Pain scores remained at 0 throughout the shift and no negative vocalizations or discomfort were noted; mood shifted from cooperative to flat affect, but ability to express feelings and thoughts improved by evening.   Improved Ability to Complete Activities of Daily Living: Remained dependent for bathing and required maximum assistance for hygiene and mobility, with no change in level of independence during the shift.   Skin Health and Integrity: Frequent weight shift assistance and encouragement were provided throughout the shift to support skin integrity, with head of bed consistently elevated.

## 2023-09-20 NOTE — Discharge Summary (Signed)
 ------------------------------------------------------------------------------- Attestation signed by Myriam Hoy Knee, MD at 09/21/23 [phone] I saw and evaluated the patient, participating in the key portions of the service on the day of discharge.  I reviewed the resident's note and agree with the discharge plans and disposition. I personally spent 15 minutes in discharge planning services. Hoy DELENA Myriam, MD  -------------------------------------------------------------------------------   Physician Discharge Summary [facility] 4 BT1 [facility] [address] [place] [address] Dept: [phone] Loc: [phone]   Identifying Information:  SKYAH HANNON May 19, 1940 [id number]  Primary Care Physician: Joshua Cathryne Rodney, MD   Code Status: Full Code  Admit Date: 09/14/2023  Discharge Date: 09/20/2023   Discharge To: Home with Home Health and/or PT/OT  Discharge Service: [facility] - Geriatrics Floor Team ([facility])   Discharge Attending Physician: Hoy Knee Myriam, MD  Discharge Diagnoses:   Principal Problem:   Confusion  Active Problems:   Anemia   UTI   Moderate protein-calorie malnutrition   Chronic fatigue    Tonsillar cancer       Anal cancer      Diabetes     Hyperlipidemia    HTN (hypertension)     Hospital Course:  Neilah MYSTIE ORMAND is a 83 y.o. female whose presentation is complicated by hypertension, hyperlipidemia, T2DM, anemia, oropharyngeal cancer, anal cancer, chronic pain, dementia that presented to [facility] with acute on chronic decrease in participatory capacity per caregivers at home.   Family notes no obvious precipitating changes, just reduced engagement with instructions during ADLs over last 2-3 days. On arrival to ED, patient afebrile and hemodynamically stable. CBC and CMP only notable for elevated creatinine -- within patient's baseline (likely CKD).  Infectious workup undergone with RVP, CT abdomen pelvis, UA all  nonrevealing of acute infectious source.  CT head negative for acute intracranial abnormality.  Utox positive for opioids, c/w outpatient use of oxycodone  5 mg q 4h for malignancy related pain. Pt admitted for further work up of AMS 2/2 multifactorial delirium vs worsening of chronic delirium. Rest of hospital course by problem below.  Mental status change C/f polypharmacy C/f malnutrition, WT loss Initial suspicion for polypharmacy in the setting of OxyContin  and open opioid concurrent use, however patient's son notes OxyContin  is new medication that has not been initiated yet.  Other potentially neuropsychiatric medications include Remeron, tizanidine , both with lower likelihood of causing acute confusion. Will lower Remeron dose to 7.5 mg and observe for changes in alertness. Family does note significant weight loss in the setting of poor p.o. intake due to esophagitis for months.  Possible for patient to be experiencing acute delirium on chronic dementia in the setting of dehydration and malnutrition. Ferritin, B12 WNL. Also ordered zinc , copper levels, which were still pending at time of discharge. Encouraged p.o. intake and monitored. Patient with good toleration of PO, stable lytes. Seen and evaluated by nutritionist/dietician on 8/12 and found to have moderate protein-calorie malnutrition with recommendation for multivitamin.  Anemia Admitted with Hgb within BL of 7-8. Acute decrease on 8/9 to 6.5. On repeat H/H, found to be 7.3. Eliquis  was held pending monitoring of HDS, clinical s/s/o bleeding. Vitals remained stable and history and exam was non focal for bleeding source. H/H remained stable. Eliquis  was resumed 8/12. Patient was transfused with 0.75 unit of PRBC (IV infiltrated) for Hgb < 7 on day of discharge.  UTI Likely antibiotic related diarrhea Hx of constipation Initial UA non focal, however found to have e coli on Ucx. Keflex was initiated for  treatment of acute cystitis. Pt  tolerated antimicrobials well, however did experience some diarrhea on 8/10. KUB w/o signs of obstruction, stool burden. Continued with bowel regimen iso ongoing opioid use. Was observed for improvement with supportive care. Will continue Keflex on discharge to complete 5 day treatment course.  Chronic: HLD -- Continued home pravastatin HTN -- Continued home antihypertensives (coreg , amlo, lisinopril ) T2DM -- No acute intervention required  The patient's hospital stay has been complicated by the following clinically significant conditions requiring additional evaluation and treatment or having a significant effect of this patient's care: - Malnutrition POA requiring further investigation, treatment, or monitoring - Medication-induced coagulopathy with bleeding POA requiring further treatment, investigation or monitoring - Anemia POA requiring further investigation or monitoring - Age related debility POA requiring additional resources: DME, PT, or OT  Nutrition Assessment:  Non-severe (Moderate) Protein-Calorie Malnutrition in the context of chronic illness (09/20/23 1331) Energy Intake: < 75% of estimated energy requirement for > or equal to 1 month Interpretation of Wt. Loss: > or equal to 7.5% x 3 month Fat Loss: Moderate Muscle Loss: Moderate Malnutrition Score: 4  Outpatient Provider Follow Up Issues:  [ ]  Zinc  level outstanding. Please consider following up iso persistent nutrition changes. [ ]  Consider facilitating hospital bed at home  Touchbase with Outpatient Provider: Warm Handoff: Completed on 09/20/23 by Alan Mas, MD  (Intern) via [facility] Message  Procedures: None ______________________________________________________________________ Discharge Medications:    Your Medication List     STOP taking these medications    hydroCHLOROthiazide  12.5 mg capsule Commonly known as: MICROZIDE    metFORMIN  500 MG tablet Commonly known as: GLUCOPHAGE    oxyCODONE  10 mg  Tr12 12 hr crush resistant ER/CR tablet Commonly known as: OxyCONTIN  Replaced by: oxyCODONE  5 MG immediate release tablet       START taking these medications    cephalexin 500 MG capsule Commonly known as: KEFLEX Take 1 capsule (500 mg total) by mouth every twelve (12) hours for 3 doses.   lidocaine  2 % Soln Commonly known as: XYLOCAINE  15 mL by Mouth route Three (3) times a day as needed (For mild/moderate pain).   magic mouthwash oral suspension (diphenhydrAMINE , nystatin ) Take 10 mL by mouth Three (3) times a day.   oxyCODONE  5 MG immediate release tablet Commonly known as: ROXICODONE  Take 1 tablet (5 mg total) by mouth every four (4) hours as needed for up to 5 days. Replaces: oxyCODONE  10 mg Tr12 12 hr crush resistant ER/CR tablet   polyethylene glycol 17 gram packet Commonly known as: MIRALAX Take 17 g by mouth in the morning. Start taking on: September 21, 2023   senna 8.6 mg tablet Commonly known as: SENOKOT Take 2 tablets by mouth nightly.       CHANGE how you take these medications    mirtazapine 7.5 MG tablet Commonly known as: REMERON Take 1 tablet (7.5 mg total) by mouth nightly. What changed:  medication strength how much to take       CONTINUE taking these medications    amlodipine 10 MG tablet Commonly known as: NORVASC Take 1 tablet (10 mg total) by mouth daily. TAKE 10 MG BY MOUTH DAILY.   aspirin 81 MG tablet Commonly known as: ECOTRIN Take 1 tablet (81 mg total) by mouth daily.   carvedilol  3.125 MG tablet Commonly known as: COREG  Take 1 tablet (3.125 mg total) by mouth two (2) times a day.   cyanocobalamin  (vitamin B-12) 1000 MCG tablet Take 1 tablet (1,000 mcg total) by  mouth daily.   ELIQUIS  2.5 mg Tab Generic drug: apixaban  Take 1 tablet (2.5 mg total) by mouth two (2) times a day.   lisinopril  20 MG tablet Commonly known as: PRINIVIL ,ZESTRIL  Take 1 tablet (20 mg total) by mouth daily.   simvastatin  20 MG tablet Commonly  known as: ZOCOR  Take 1 tablet (20 mg total) by mouth daily.   tizanidine  4 MG tablet Commonly known as: ZANAFLEX  Take 1 tablet (4 mg total) by mouth daily.        Allergies: Patient has no known allergies. ______________________________________________________________________ Pending Test Results: Pending Labs     Order Current Status   Copper, Serum In process   Vitamin D  25 Hydroxy (25OH D2 + D3) In process   Zinc  Level, Serum In process   Zinc  Level, Serum In process       Most Recent Labs: All lab results last 24 hours -  Recent Results (from the past 24 hours)  Basic Metabolic Panel   Collection Time: 09/20/23  8:07 AM  Result Value Ref Range   Sodium 146 (H) 135 - 145 mmol/L   Potassium 3.5 3.4 - 4.8 mmol/L   Chloride 106 98 - 107 mmol/L   CO2 27.0 20.0 - 31.0 mmol/L   Anion Gap 13 5 - 14 mmol/L   BUN 12 9 - 23 mg/dL   Creatinine 8.86 (H) 9.44 - 1.02 mg/dL   BUN/Creatinine Ratio 11    eGFR CKD-EPI (2021) Female 48 (L) >=60 mL/min/1.32m2   Glucose 79 70 - 179 mg/dL   Calcium 8.7 8.7 - 89.5 mg/dL  Magnesium  Level   Collection Time: 09/20/23  8:07 AM  Result Value Ref Range   Magnesium  1.7 1.6 - 2.6 mg/dL  CBC w/ Differential   Collection Time: 09/20/23  8:07 AM  Result Value Ref Range   WBC 3.6 3.6 - 11.2 10*9/L   RBC 2.18 (L) 3.95 - 5.13 10*12/L   HGB 6.9 (L) 11.3 - 14.9 g/dL   HCT 79.4 (L) 65.9 - 55.9 %   MCV 93.6 77.6 - 95.7 fL   MCH 31.5 25.9 - 32.4 pg   MCHC 33.6 32.0 - 36.0 g/dL   RDW 81.3 (H) 87.7 - 84.7 %   MPV 7.5 6.8 - 10.7 fL   Platelet 90 (L) 150 - 450 10*9/L   nRBC 0 <=4 /100 WBCs   Neutrophils % 81.3 %   Lymphocytes % 9.7 %   Monocytes % 8.2 %   Eosinophils % 0.4 %   Basophils % 0.4 %   Absolute Neutrophils 3.0 1.8 - 7.8 10*9/L   Absolute Lymphocytes 0.4 (L) 1.1 - 3.6 10*9/L   Absolute Monocytes 0.3 0.3 - 0.8 10*9/L   Absolute Eosinophils 0.0 0.0 - 0.5 10*9/L   Absolute Basophils 0.0 0.0 - 0.1 10*9/L   Anisocytosis Moderate (A)  Not Present  Prepare RBC   Collection Time: 09/20/23  2:22 PM  Result Value Ref Range   PRODUCT CODE [id number]    Product ID Red Blood Cells    Specimen Expiration Date [id number]    Status Transfused    Unit # [id number]    Unit Blood Type A Pos    ISBT Number 6200    Crossmatch Compatible     Relevant Studies/Radiology: XR Abdomen 1 View Result Date: 09/18/2023 EXAM: XR ABDOMEN 1 VIEW ACCESSION: [id number] UN REPORT DATE: 09/18/2023 2:00 PM CLINICAL INDICATION: 83 years old with DIARRHEA  COMPARISON: CT abdomen pelvis 09/15/2023. TECHNIQUE: Supine view of the abdomen, 2  image(s) FINDINGS: Moderate gaseous distention of the stomach. Relative paucity of abdominal bowel gas which limits evaluation for obstruction. Suture material projects over the right pelvis. Right upper quadrant surgical clips. Pelvic phleboliths. The visualized lung bases are clear. No acute osseous abnormality. Multilevel degenerative changes of the spine. Degenerative changes of the hips.   Nonspecific bowel gas pattern with moderate gaseous distention of the stomach.   CT Abdomen Pelvis W IV Contrast Only Result Date: 09/15/2023 EXAM: CT ABDOMEN PELVIS W CONTRAST ACCESSION: [id number] UN REPORT DATE: 09/15/2023 7:20 AM CLINICAL INDICATION: 83 years old with Encephalopathy, gen abd pain  COMPARISON: None TECHNIQUE: A helical CT scan of the abdomen and pelvis was obtained following IV contrast from the lung bases through the pubic symphysis. Images were reconstructed in the axial plane. Coronal and sagittal reformatted images were also provided for further evaluation. FINDINGS: LOWER CHEST: Unremarkable. LIVER: Normal liver contour.  No focal liver lesions. BILIARY: The gallbladder is surgically absent. Mild central biliary ductal dilatation. SPLEEN: Normal in size and contour. 0.9 cm hypoattenuating focus nonspecific though favored represent splenic cyst (3:5). PANCREAS: Normal pancreatic contour.  No focal lesions.  No  ductal dilation. ADRENAL GLANDS: Normal appearance of the adrenal glands. KIDNEYS/URETERS: Symmetric renal enhancement.  No hydronephrosis. Bilateral renal cysts with additional subcentimeter hypoattenuating foci too small to characterize further on CT. BLADDER: Unremarkable. REPRODUCTIVE ORGANS: Anteverted uterus with prominent endometrium. No evidence of adnexal mass. GI TRACT: No findings of bowel obstruction or acute inflammation. Colonic diverticulosis. The appendix is not clearly identified but there are no secondary signs of appendicitis. Mild to moderate rectal stool burden. Mild left-sided questionable wall thickening of the rectum is also noted. PERITONEUM, RETROPERITONEUM AND MESENTERY: No free air.  No ascites.  No fluid collection. Nonspecific mild to moderate presacral edema. LYMPH NODES: No adenopathy. VESSELS: Hepatic and portal veins are patent.  Normal caliber aorta with marked calcified atherosclerosis.  BONES and SOFT TISSUES: Diffuse osseous demineralization. Multilevel degenerative changes of the thoracolumbar spine. Postsurgical appearance of the right lower anterior abdominal wall. Moderate body wall edema along the lower abdomen and gluteal region/thighs.   --Mild left-sided questionable wall thickening of the rectum which is nonspecific but this could also be secondary to mild proctitis. Clinical correlation is recommended. --Otherwise, no acute abdominopelvic abnormality. -- Nonspecific prominence of the endometrium in the absence of postmenopausal bleeding. Clinical correlation is recommended. -- Chronic and incidental findings as detailed in the body of the report. ==================== MODIFIED REPORT: (09/15/2023 11:42 AM) This report has been modified from its preliminary version; you may check the prior versions of radiology report, results history link for prior report versions (if they were previously visible in Epic). -----------------------------------------------  ECG 12  Lead Result Date: 09/15/2023 SINUS TACHYCARDIA WITH PREMATURE ATRIAL BEATS RIGHT AXIS DEVIATION INCOMPLETE RIGHT BUNDLE BRANCH BLOCK ABNORMAL ECG WHEN COMPARED WITH ECG OF 02-Jul-2023 15:31, PREMATURE ATRIAL BEATS ARE MORE FREQUENT Confirmed by Lennie Cough [id number]) on 09/15/2023 7:22:30 AM  XR Chest 1 view Portable Result Date: 09/14/2023 EXAM: XR CHEST PORTABLE ACCESSION: [id number] UN REPORT DATE: 09/14/2023 9:19 PM CLINICAL INDICATION: ALTERED MENTAL STATUS  TECHNIQUE: Single View AP Chest Radiograph. COMPARISON: Chest radiograph Jul 02, 2023 FINDINGS: Right chest portacatheter tip terminates over the superior cavoatrial junction, when accounting for patient rotation. Lungs are clear.  No pleural effusion or pneumothorax. Cardiac silhouette is normal in size. Thoracic aorta is tortuous with calcifications. Diffuse idiopathic skeletal hyperostosis (DISH), evidenced by multilevel paravertebral enthesial osseous bridging of multiple consecutive vertebral bodies in the  lower half thoracic spine. Moderate degenerative changes of the glenohumeral joints. Osteopenia.   No acute abnormalities.   CT head WO contrast Result Date: 09/14/2023 EXAM: Computed tomography, head or brain without contrast material. ACCESSION: [id number] UN CLINICAL INDICATION: 83 years old Female with Acute AMS  COMPARISON: MRI brain 07/03/2023, CT head 07/02/2023 TECHNIQUE: Axial CT images of the head from skull base to vertex without contrast. FINDINGS: Mild cerebral atrophy with ex vacuo ventricular dilatation. Periventricular and deep white matter hypodensities are nonspecific but commonly seen with chronic small vessel disease. There is no midline shift. No mass lesion. There is no evidence of acute infarct.  The sinuses are pneumatized. Bilateral lens replacement. No intracranial hemorrhage or skull fractures. Atherosclerotic calcifications of the bilateral carotid siphons.   No acute intracranial abnormality.    ______________________________________________________________________ Discharge Instructions:  Activity Instructions     Activity as tolerated              Follow Up instructions and Outpatient Referrals    Ambulatory Referral to Home Health     Reason for referral: Deconditioning   Physician to follow patient's care: PCP   Disciplines requested:  Physical Therapy Occupational Therapy     Physical Therapy requested:  Evaluate and treat Home safety evaluation     Occupational Therapy Requested:  Home safety evaluation Evaluate and treat     Requested SOC Date:  Comment - Within 48 hours of hospital discharge.   Call MD for:  persistent nausea or vomiting     Call MD for:  severe uncontrolled pain     Call MD for: Temperature > 38.5 Celsius ( > 101.3 Fahrenheit)     Discharge instructions       Appointments which have been scheduled for you    Sep 28, 2023 2:00 PM (Arrive by 1:35 PM) URGENT with Sula Sheree Morrie Terra, MD [facility] NEUROLOGY CLINIC [address] [place] [facility]) [address] [place] [address] [phone]        ______________________________________________________________________ Discharge Day Services: BP 151/56   Pulse 77   Temp 37.7 C (99.9 F) (Oral)   Resp 20   Ht 167.6 cm (5' 5.98)   Wt 52.2 kg (115 lb)   SpO2 100%   BMI 18.57 kg/m   Pt seen on the day of discharge and determined appropriate for discharge.  Condition at Discharge: stable  Length of Discharge: I spent greater than 30 mins in the discharge of this patient.

## 2023-09-21 NOTE — Care Plan (Signed)
 Transition of Care Encounter Data   Call attempt: 1 Admission date: 09/15/23 Discharge date: 09/20/23 Discharge diagnosis: Confusion Do you have a hospital follow up appointment?: Yes - Within 14 Days, Yes with Specialty Provider clinic F/U Date: 09/28/23 F/U Provider: Sula Morrie Heinrich, MD [facility]OLOGY [facility]IC Patient post discharge: Medications:      SABRA   [facility]: [phone]:  .  [facility]: [phone]:  .  Other: Contact PCP:     [facility] TRANSITIONAL CASE MANAGEMENT SUMMARY NOTE   Attempted to contact patient today at Cell to complete Transitional Case Management call from [facility]. No answer/unable to leave message; 1st attempt.     LORRIE FORBES OHM, RN

## 2023-09-22 ENCOUNTER — Other Ambulatory Visit: Payer: Self-pay | Admitting: *Deleted

## 2023-09-22 ENCOUNTER — Telehealth: Payer: Self-pay

## 2023-09-22 ENCOUNTER — Inpatient Hospital Stay (HOSPITAL_BASED_OUTPATIENT_CLINIC_OR_DEPARTMENT_OTHER): Admitting: Oncology

## 2023-09-22 ENCOUNTER — Inpatient Hospital Stay: Attending: Radiation Oncology

## 2023-09-22 ENCOUNTER — Encounter: Payer: Self-pay | Admitting: Oncology

## 2023-09-22 ENCOUNTER — Inpatient Hospital Stay

## 2023-09-22 ENCOUNTER — Telehealth: Payer: Self-pay | Admitting: *Deleted

## 2023-09-22 ENCOUNTER — Inpatient Hospital Stay (HOSPITAL_BASED_OUTPATIENT_CLINIC_OR_DEPARTMENT_OTHER): Admitting: Hospice and Palliative Medicine

## 2023-09-22 VITALS — BP 97/52 | HR 63 | Temp 96.5°F | Resp 18 | Wt 116.0 lb

## 2023-09-22 DIAGNOSIS — C109 Malignant neoplasm of oropharynx, unspecified: Secondary | ICD-10-CM

## 2023-09-22 DIAGNOSIS — Z7901 Long term (current) use of anticoagulants: Secondary | ICD-10-CM | POA: Insufficient documentation

## 2023-09-22 DIAGNOSIS — Z923 Personal history of irradiation: Secondary | ICD-10-CM | POA: Insufficient documentation

## 2023-09-22 DIAGNOSIS — Z79899 Other long term (current) drug therapy: Secondary | ICD-10-CM | POA: Insufficient documentation

## 2023-09-22 DIAGNOSIS — D696 Thrombocytopenia, unspecified: Secondary | ICD-10-CM | POA: Diagnosis not present

## 2023-09-22 DIAGNOSIS — D72819 Decreased white blood cell count, unspecified: Secondary | ICD-10-CM | POA: Insufficient documentation

## 2023-09-22 DIAGNOSIS — Z7952 Long term (current) use of systemic steroids: Secondary | ICD-10-CM | POA: Insufficient documentation

## 2023-09-22 DIAGNOSIS — Z7982 Long term (current) use of aspirin: Secondary | ICD-10-CM | POA: Diagnosis not present

## 2023-09-22 DIAGNOSIS — C760 Malignant neoplasm of head, face and neck: Secondary | ICD-10-CM

## 2023-09-22 DIAGNOSIS — I10 Essential (primary) hypertension: Secondary | ICD-10-CM | POA: Insufficient documentation

## 2023-09-22 DIAGNOSIS — Z85048 Personal history of other malignant neoplasm of rectum, rectosigmoid junction, and anus: Secondary | ICD-10-CM | POA: Diagnosis present

## 2023-09-22 DIAGNOSIS — C099 Malignant neoplasm of tonsil, unspecified: Secondary | ICD-10-CM

## 2023-09-22 DIAGNOSIS — D649 Anemia, unspecified: Secondary | ICD-10-CM

## 2023-09-22 DIAGNOSIS — Z515 Encounter for palliative care: Secondary | ICD-10-CM

## 2023-09-22 DIAGNOSIS — E119 Type 2 diabetes mellitus without complications: Secondary | ICD-10-CM | POA: Diagnosis not present

## 2023-09-22 DIAGNOSIS — Z87891 Personal history of nicotine dependence: Secondary | ICD-10-CM | POA: Diagnosis not present

## 2023-09-22 LAB — CBC WITH DIFFERENTIAL (CANCER CENTER ONLY)
Abs Immature Granulocytes: 0.03 K/uL (ref 0.00–0.07)
Basophils Absolute: 0 K/uL (ref 0.0–0.1)
Basophils Relative: 0 %
Eosinophils Absolute: 0 K/uL (ref 0.0–0.5)
Eosinophils Relative: 0 %
HCT: 23.4 % — ABNORMAL LOW (ref 36.0–46.0)
Hemoglobin: 7.4 g/dL — ABNORMAL LOW (ref 12.0–15.0)
Immature Granulocytes: 1 %
Lymphocytes Relative: 11 %
Lymphs Abs: 0.4 K/uL — ABNORMAL LOW (ref 0.7–4.0)
MCH: 30.8 pg (ref 26.0–34.0)
MCHC: 31.6 g/dL (ref 30.0–36.0)
MCV: 97.5 fL (ref 80.0–100.0)
Monocytes Absolute: 0.2 K/uL (ref 0.1–1.0)
Monocytes Relative: 6 %
Neutro Abs: 2.9 K/uL (ref 1.7–7.7)
Neutrophils Relative %: 82 %
Platelet Count: 97 K/uL — ABNORMAL LOW (ref 150–400)
RBC: 2.4 MIL/uL — ABNORMAL LOW (ref 3.87–5.11)
RDW: 16.7 % — ABNORMAL HIGH (ref 11.5–15.5)
WBC Count: 3.6 K/uL — ABNORMAL LOW (ref 4.0–10.5)
nRBC: 0 % (ref 0.0–0.2)

## 2023-09-22 LAB — PREPARE RBC (CROSSMATCH)

## 2023-09-22 NOTE — Telephone Encounter (Signed)
 Copied from CRM [id number]. Topic: Clinical - Home Health Verbal Orders >> Sep 22, 2023 10:15 AM Larissa RAMAN wrote: Caller/Agency: [facility]/ [facility] Home health Callback Number: [phone] Service Requested: Physical Therapy Frequency:  1 X 9 Any new concerns about the patient? No

## 2023-09-22 NOTE — Progress Notes (Signed)
 Palliative Medicine [facility] at [facility] Telephone:(336) [phone] Fax:(336) [phone]   Name: April [name] Date: 09/22/2023 MRN: [id number]  DOB: 1940/09/08  Patient Care Team: Salli Amato, MD as PCP - General (Internal Medicine) Cherilyn Debby LITTIE, MD as Consulting Physician (Internal Medicine) Maurie Rayfield BIRCH, RN as Oncology Nurse Navigator [name], [name], MD as Consulting Physician (Oncology) [name], MD as Consulting Physician (Radiation Oncology)    REASON FOR CONSULTATION: April [name] is a 83 y.o. 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  Patient is referred to palliative care to address goals and manage ongoing symptoms.  SOCIAL HISTORY:     reports that she quit smoking about 35 years ago. Her smoking use included cigarettes. She started smoking about 43 years ago. She has a 4 pack-year smoking history. She has been exposed to tobacco smoke. She has never used smokeless tobacco. She reports that she does not drink alcohol and does not use drugs.  ADVANCE DIRECTIVES:    CODE STATUS:   PAST MEDICAL HISTORY: Past Medical History:  Diagnosis Date   Anemia    Diabetes mellitus without complication (HCC)    Gout    Hyperlipidemia    Hypertension    Wears dentures    full upper and lower    PAST SURGICAL HISTORY:  Past Surgical History:  Procedure Laterality Date   APPENDECTOMY     gallstones  06/12/2009   MICROLARYNGOSCOPY Right 01/28/2023   Procedure: MICRODIRECT LARYNGOSCOPY WITH BIOPSY OF OROPHARYNGEAL MASS;  Surgeon: Herminio Miu, MD;  Location: [facility];  Service: ENT;  Laterality: Right;  Diabetic   PORTACATH PLACEMENT N/A 02/18/2023   Procedure: INSERTION PORT-A-CATH;  Surgeon: Lane Shope, MD;  Location: [facility];  Service: General;  Laterality: N/A;   TONSILLECTOMY       HEMATOLOGY/ONCOLOGY HISTORY:  Oncology History  Anal squamous cell carcinoma (HCC)  01/18/2023 Initial Diagnosis   Anal squamous cell carcinoma (HCC)   01/18/2023 Cancer Staging   Staging form: Anus, AJCC V9 - Clinical stage from 01/18/2023: Stage IIIA (cT3, cN0, cM0) - Signed by [name] [name], MD on 01/18/2023 Stage prefix: Initial diagnosis   02/07/2023 - 03/07/2023 Chemotherapy   Patient is on Treatment Plan : ANUS Mitomycin  D1,29 + Capecitabine  + XRT     Right tonsillar squamous cell carcinoma (HCC)  04/12/2023 Initial Diagnosis   Right tonsillar squamous cell carcinoma (HCC)   04/12/2023 Cancer Staging   Staging form: Pharynx - P16 Negative Oropharynx, AJCC 8th Edition - Clinical stage from 04/12/2023: Stage III (cT3, cN1, cM0, p16: Unknown) - Signed by [name] [name], MD on 04/12/2023 Stage prefix: Initial diagnosis   05/05/2023 -  Chemotherapy   Patient is on Treatment Plan : HEAD/NECK Cisplatin  (40) q7d       ALLERGIES:  has no known allergies.  MEDICATIONS:  Current Outpatient Medications  Medication Sig Dispense Refill   amLODipine (NORVASC) 10 MG tablet Take 10 mg by mouth daily.     apixaban  (ELIQUIS ) 2.5 MG TABS tablet Take 1 tablet (2.5 mg total) by mouth 2 (two) times daily. 60 tablet 1   aspirin 81 MG tablet Take 1 tablet by mouth daily.     carvedilol  (COREG ) 3.125 MG tablet Take 1 tablet by mouth 2 (two) times daily. cardio     colchicine  0.6 MG tablet Take by mouth.  dexamethasone  (DECADRON ) 2 MG tablet Take 1 tablet (2 mg total) by mouth 2 (two) times daily with a meal. 45 tablet 0   diphenoxylate -atropine  (LOMOTIL ) 2.5-0.025 MG tablet Take 1 tablet by mouth 4 (four) times daily as needed for diarrhea or loose stools. 30 tablet 0   lisinopril -hydrochlorothiazide  (ZESTORETIC ) 20-25 MG tablet Take 1 tablet by mouth daily. (Patient not taking: Reported on 09/22/2023)     magic mouthwash (multi-ingredient) oral suspension Swish and swallow 5-10 mLs 4  (four) times daily as needed. 480 mL 3   naloxone  (NARCAN ) nasal spray 4 mg/0.1 mL SPRAY 1 SPRAY INTO ONE NOSTRIL AS DIRECTED FOR OPIOID OVERDOSE (TURN PERSON ON SIDE AFTER DOSE. IF NO RESPONSE IN 2-3 MINUTES OR PERSON RESPONDS BUT RELAPSES, REPEAT USING A NEW SPRAY DEVICE AND SPRAY INTO THE OTHER NOSTRIL. CALL 911 AFTER USE.) * EMERGENCY USE ONLY * 1 each 0   oxyCODONE  (OXY IR/ROXICODONE ) 5 MG immediate release tablet Take 1 tablet (5 mg total) by mouth every 4 (four) hours as needed for severe pain (pain score 7-10). 30 tablet 0   oxyCODONE  (OXYCONTIN ) 10 mg 12 hr tablet Take 1 tablet (10 mg total) by mouth every 12 (twelve) hours. 30 tablet 0   simvastatin  (ZOCOR ) 20 MG tablet Take 20 mg by mouth daily.     sucralfate  (CARAFATE ) 1 GM/10ML suspension Take 10 mLs (1 g total) by mouth 4 (four) times daily -  with meals and at bedtime. 420 mL 0   tiZANidine  (ZANAFLEX ) 4 MG tablet Take 1 tablet (4 mg total) by mouth at bedtime. 30 tablet 0   No current facility-administered medications for this visit.   Facility-Administered Medications Ordered in Other Visits  Medication Dose Route Frequency Provider Last Rate Last Admin   0.9 %  sodium chloride  infusion   Intravenous Continuous Finnegan, Timothy J, MD   Stopped at 05/13/23 1535   morphine  (PF) 2 MG/ML injection             VITAL SIGNS: There were no vitals taken for this visit. There were no vitals filed for this visit.  Estimated body mass index is 18.72 kg/m as calculated from the following:   Height as of 08/16/23: 5' 6 (1.676 m).   Weight as of an earlier encounter on 09/22/23: 116 lb (52.6 kg).  LABS: CBC:    Component Value Date/Time   WBC 3.6 (L) 09/22/2023 1350   WBC 3.6 (L) 08/04/2023 0949   HGB 7.4 (L) 09/22/2023 1350   HGB 8.4 (L) 01/04/2023 1205   HCT 23.4 (L) 09/22/2023 1350   HCT 27.1 (L) 01/04/2023 1205   PLT 97 (L) 09/22/2023 1350   PLT 222 01/04/2023 1205   MCV 97.5 09/22/2023 1350   MCV 87 01/04/2023 1205    NEUTROABS 2.9 09/22/2023 1350   NEUTROABS 3.4 01/04/2023 1205   LYMPHSABS 0.4 (L) 09/22/2023 1350   LYMPHSABS 2.0 01/04/2023 1205   MONOABS 0.2 09/22/2023 1350   EOSABS 0.0 09/22/2023 1350   EOSABS 0.2 01/04/2023 1205   BASOSABS 0.0 09/22/2023 1350   BASOSABS 0.0 01/04/2023 1205   Comprehensive Metabolic Panel:    Component Value Date/Time   NA 136 08/04/2023 0949   NA 142 05/23/2023 1115   K 4.2 08/04/2023 0949   CL 103 08/04/2023 0949   CO2 24 08/04/2023 0949   BUN 34 (H) 08/04/2023 0949   BUN 9 05/23/2023 1115   CREATININE 1.33 (H) 08/04/2023 0949   GLUCOSE 98 08/04/2023 0949   CALCIUM 8.7 (  L) 08/04/2023 0949   AST 16 08/04/2023 0949   ALT 10 08/04/2023 0949   ALKPHOS 49 08/04/2023 0949   BILITOT 0.8 08/04/2023 0949   PROT 6.3 (L) 08/04/2023 0949   PROT 7.0 06/01/2022 1017   ALBUMIN 3.3 (L) 08/04/2023 0949   ALBUMIN 3.5 (L) 05/23/2023 1115    RADIOGRAPHIC STUDIES: No results found.  PERFORMANCE STATUS (ECOG) : 2 - Symptomatic, <50% confined to bed  Review of Systems Unless otherwise noted, a complete review of systems is negative.  Physical Exam General: NAD Cardiovascular: regular rate and rhythm Pulmonary: clear ant fields Abdomen: soft, nontender, + bowel sounds GU: no suprapubic tenderness Extremities: no edema, no joint deformities Skin: no rashes Neurological: Weakness but otherwise nonfocal  IMPRESSION: Since last seen, patient was hospitalized at [facility] with altered mental status.  Found to have UTI.  Patient returns for clinic follow-up today.  Reportedly, pain is significantly improved.  Patient never started the OxyContin  and has been utilizing oxycodone  less frequently.  Patient has not required any oxycodone  today.  Given improvement in pain, we discussed holding off on initiation of OxyContin  at this time.  Patient sent home with a MOST form to review with family  PLAN: - Continue current scope of treatment - Hold OxyContin  - Continue  oxycodone  IR as needed for breakthrough pain - Daily bowel regimen prevent opioid-induced constipation -MOST form reviewed - Follow-up 2 weeks  Case and plan discussed with Dr. [name]  Patient expressed understanding and was in agreement with this plan. She also understands that She can call the clinic at any time with any questions, concerns, or complaints.     Time Total: 15 minutes  Visit consisted of counseling and education dealing with the complex and emotionally intense issues of symptom management and palliative care in the setting of serious and potentially life-threatening illness.Greater than 50%  of this time was spent counseling and coordinating care related to the above assessment and plan.  Signed by: Fonda Mower, PhD, NP-C

## 2023-09-22 NOTE — Care Plan (Signed)
 Transition of Care Encounter Data   Call attempt: 2 Admission date: 09/15/23 Discharge date: 09/20/23 Discharge diagnosis: Confusion Do you have a hospital follow up appointment?: Yes - Within 14 Days, Yes with Specialty Provider clinic F/U Date: 09/28/23 F/U Provider: Almodovar Suarez, Sula Ford, MD Patient post discharge: Medications:      SABRA   [facility]: [phone]:  .  [facility]: [phone]:  .  Other: Contact PCP:       [facility] TRANSITIONAL CASE MANAGEMENT SUMMARY NOTE   Attempted to contact patient today at Home to complete Transitional Case Management call from [facility]. No answer/unable to leave message; 2nd attempt.           April JINNY Lout, RN

## 2023-09-22 NOTE — Telephone Encounter (Signed)
 Spoke wit Burnard and gave verbal orders for home health PT. 1X9.

## 2023-09-22 NOTE — Progress Notes (Signed)
 Nutrition  Patient left clinic before being seen by RD today.  Rescheduled to see on 8/15  Jerel Sardina B. Dasie SOLON, CSO, LDN Registered Dietitian [phone]

## 2023-09-22 NOTE — Telephone Encounter (Signed)
 Called and spoke with patient son and stated that patient has appointment tomorrow at 1pm for a blood transfusion. Son verbalized understanding.

## 2023-09-22 NOTE — Telephone Encounter (Signed)
 Left message for April [name] to call back in regard to patient. JM

## 2023-09-22 NOTE — Telephone Encounter (Signed)
 Thanks

## 2023-09-22 NOTE — Telephone Encounter (Signed)
 Agree with the request. Do they need anything from me?

## 2023-09-22 NOTE — Telephone Encounter (Signed)
 Burnard returning call. States her phone number is a secure line and office can leave vm.   Best contact number: [phone]

## 2023-09-22 NOTE — Progress Notes (Signed)
 [facility]  Telephone:(336) [phone] Fax:(336) [phone]  ID: April [name] OB: December 03, 1940  MR#: [id number]  RDW#:[id number]  Patient Care Team: Salli Amato, MD as PCP - General (Internal Medicine) Cherilyn Debby LITTIE, MD as Consulting Physician (Internal Medicine) Maurie Rayfield BIRCH, RN as Oncology Nurse Navigator [name], [name], MD as Consulting Physician (Oncology) [name], MD as Consulting Physician (Radiation Oncology)  CHIEF COMPLAINT: Stage IIIa squamous cell carcinoma of the anus.  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  INTERVAL HISTORY: Patient returns to clinic today for repeat laboratory and further evaluation.  She was recently admitted to [facility] with UTI and confusion but feels improved since discharge.  She continues to have chronic weakness and fatigue.  She reports her pain is better controlled but still evident.  She continues to have a poor appetite.  She has no neurologic complaints.  She denies any recent fevers or illnesses.  She has no chest pain, shortness of breath, cough, or hemoptysis.  She denies any nausea, vomiting, constipation, or diarrhea.  She has no melena or hematochezia.  She has no urinary complaints.  Patient offers no further specific complaints today.  REVIEW OF SYSTEMS:   Review of Systems  Constitutional:  Positive for malaise/fatigue. Negative for fever and weight loss.  HENT:  Positive for sore throat.   Respiratory: Negative.  Negative for cough, hemoptysis and shortness of breath.   Cardiovascular:  Positive for leg swelling. Negative for chest pain.  Gastrointestinal: Negative.  Negative for abdominal pain, blood in stool, diarrhea and melena.  Genitourinary: Negative.  Negative for dysuria.  Musculoskeletal:  Positive for neck pain. Negative for back pain.  Skin: Negative.  Negative for rash.  Neurological:  Positive for weakness. Negative for dizziness, focal weakness and headaches.   Psychiatric/Behavioral: Negative.  The patient is not nervous/anxious.    As per HPI. Otherwise, a complete review of systems is negative.  PAST MEDICAL HISTORY: Past Medical History:  Diagnosis Date   Anemia    Diabetes mellitus without complication (HCC)    Gout    Hyperlipidemia    Hypertension    Wears dentures    full upper and lower    PAST SURGICAL HISTORY: Past Surgical History:  Procedure Laterality Date   APPENDECTOMY     gallstones  06/12/2009   MICROLARYNGOSCOPY Right 01/28/2023   Procedure: MICRODIRECT LARYNGOSCOPY WITH BIOPSY OF OROPHARYNGEAL MASS;  Surgeon: Herminio Miu, MD;  Location: [facility];  Service: ENT;  Laterality: Right;  Diabetic   PORTACATH PLACEMENT N/A 02/18/2023   Procedure: INSERTION PORT-A-CATH;  Surgeon: Lane Shope, MD;  Location: [facility];  Service: General;  Laterality: N/A;   TONSILLECTOMY      FAMILY HISTORY: Family History  Problem Relation Age of Onset   Breast cancer Neg Hx     ADVANCED DIRECTIVES (Y/N):  N  HEALTH MAINTENANCE: Social History   Tobacco Use   Smoking status: Former    Current packs/day: 0.00    Average packs/day: 0.5 packs/day for 8.0 years (4.0 ttl pk-yrs)    Types: Cigarettes    Start date: 51    Quit date: 1990    Years since quitting: 35.6    Passive exposure: Past   Smokeless tobacco: Never  Vaping Use   Vaping status: Never Used  Substance Use Topics   Alcohol use: No    Alcohol/week: 0.0 standard drinks of alcohol   Drug use: No     Colonoscopy:  PAP:  Bone  density:  Lipid panel:  No Known Allergies  Current Outpatient Medications  Medication Sig Dispense Refill   amLODipine (NORVASC) 10 MG tablet Take 10 mg by mouth daily.     apixaban  (ELIQUIS ) 2.5 MG TABS tablet Take 1 tablet (2.5 mg total) by mouth 2 (two) times daily. 60 tablet 1   aspirin 81 MG tablet Take 1 tablet by mouth daily.     carvedilol  (COREG ) 3.125 MG tablet Take 1 tablet by mouth 2 (two) times  daily. cardio     colchicine  0.6 MG tablet Take by mouth.     dexamethasone  (DECADRON ) 2 MG tablet Take 1 tablet (2 mg total) by mouth 2 (two) times daily with a meal. 45 tablet 0   diphenoxylate -atropine  (LOMOTIL ) 2.5-0.025 MG tablet Take 1 tablet by mouth 4 (four) times daily as needed for diarrhea or loose stools. 30 tablet 0   magic mouthwash (multi-ingredient) oral suspension Swish and swallow 5-10 mLs 4 (four) times daily as needed. 480 mL 3   naloxone  (NARCAN ) nasal spray 4 mg/0.1 mL SPRAY 1 SPRAY INTO ONE NOSTRIL AS DIRECTED FOR OPIOID OVERDOSE (TURN PERSON ON SIDE AFTER DOSE. IF NO RESPONSE IN 2-3 MINUTES OR PERSON RESPONDS BUT RELAPSES, REPEAT USING A NEW SPRAY DEVICE AND SPRAY INTO THE OTHER NOSTRIL. CALL 911 AFTER USE.) * EMERGENCY USE ONLY * 1 each 0   oxyCODONE  (OXY IR/ROXICODONE ) 5 MG immediate release tablet Take 1 tablet (5 mg total) by mouth every 4 (four) hours as needed for severe pain (pain score 7-10). 30 tablet 0   oxyCODONE  (OXYCONTIN ) 10 mg 12 hr tablet Take 1 tablet (10 mg total) by mouth every 12 (twelve) hours. 30 tablet 0   simvastatin  (ZOCOR ) 20 MG tablet Take 20 mg by mouth daily.     sucralfate  (CARAFATE ) 1 GM/10ML suspension Take 10 mLs (1 g total) by mouth 4 (four) times daily -  with meals and at bedtime. 420 mL 0   tiZANidine  (ZANAFLEX ) 4 MG tablet Take 1 tablet (4 mg total) by mouth at bedtime. 30 tablet 0   lisinopril -hydrochlorothiazide  (ZESTORETIC ) 20-25 MG tablet Take 1 tablet by mouth daily. (Patient not taking: Reported on 09/22/2023)     No current facility-administered medications for this visit.   Facility-Administered Medications Ordered in Other Visits  Medication Dose Route Frequency Provider Last Rate Last Admin   0.9 %  sodium chloride  infusion   Intravenous Continuous Marcayla Budge J, MD   Stopped at 05/13/23 1535   morphine  (PF) 2 MG/ML injection             OBJECTIVE: Vitals:   09/22/23 1413  BP: (!) 97/52  Pulse: 63  Resp: 18  Temp:  (!) 96.5 F (35.8 C)  SpO2: 95%       Body mass index is 18.72 kg/m.    ECOG FS:3 - Symptomatic, >50% confined to bed  General: Well-developed, well-nourished, no acute distress.  Sitting in a wheelchair. Eyes: Pink conjunctiva, anicteric sclera. HEENT: Normocephalic, moist mucous membranes. Lungs: No audible wheezing or coughing. Heart: Regular rate and rhythm. Abdomen: Soft, nontender, no obvious distention. Musculoskeletal: No edema, cyanosis, or clubbing. Neuro: Alert, answering all questions appropriately. Cranial nerves grossly intact. Skin: No rashes or petechiae noted. Psych: Normal affect.    LAB RESULTS:  Lab Results  Component Value Date   NA 136 08/04/2023   K 4.2 08/04/2023   CL 103 08/04/2023   CO2 24 08/04/2023   GLUCOSE 98 08/04/2023   BUN 34 (H) 08/04/2023  CREATININE 1.33 (H) 08/04/2023   CALCIUM 8.7 (L) 08/04/2023   PROT 6.3 (L) 08/04/2023   ALBUMIN 3.3 (L) 08/04/2023   AST 16 08/04/2023   ALT 10 08/04/2023   ALKPHOS 49 08/04/2023   BILITOT 0.8 08/04/2023   GFRNONAA 39 (L) 08/04/2023   GFRAA 59 (L) 09/24/2019    Lab Results  Component Value Date   WBC 3.6 (L) 09/22/2023   NEUTROABS 2.9 09/22/2023   HGB 7.4 (L) 09/22/2023   HCT 23.4 (L) 09/22/2023   MCV 97.5 09/22/2023   PLT 97 (L) 09/22/2023     STUDIES: No results found.   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  Patient has now completed concurrent XRT along with chemotherapy.  Her last dose of mitomycin  was on March 07, 2023.  She finished Xeloda  and XRT on March 25, 2023.  Repeat PET scan results from April 07, 2023 reviewed independently with persistent hypermetabolism at  the site of her anal cancer, but PET scan was done sooner than required given her head and neck cancer and this may be residual inflammation/irritation from XRT.  Repeat PET scan in approximately 3 weeks.   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  After multiple delays, patient finally received her seventh and final treatment of dose reduced cisplatin  on July 28, 2023.  She completed XRT on August 04, 2023.  Repeat PET scan in approximately 3 weeks with follow-up 1 week later.  Patient will also require follow-up with ENT in the near future. Thyroid  nodule with hypermetabolism: Defer ultrasound and biopsy until completion of treatment of 2 malignancies above. Anemia: Hemoglobin continues to trend down and is now 7.4.  Return to clinic tomorrow for 1 unit packed red blood cells. Leukopenia: Chronic and unchanged.  Patient's total white blood cell count is 3.6. Thrombocytopenia: Platelet count has declined slightly to 97, monitor. Hypomagnesia: Resolved.  Renal insufficiency: GFR continues to trend up but is now 48.   Pain: Patient reports improved control.  Continue OxyContin  10 mg every 12 hours in addition to oxycodone  5 mg every 4-6 hours as needed.  Appreciate palliative care input.   Poor appetite/weight loss: Mildly improved.  Appreciate dietary input. Confusion: Recent mission was secondary to UTI.  This appears to have resolved.   Patient expressed understanding and was in agreement with this plan. She also understands that She can call clinic at any time with any questions, concerns, or complaints.    Cancer Staging  Anal squamous cell carcinoma (HCC) Staging form: Anus, AJCC V9 - Clinical stage from 01/18/2023: Stage IIIA (cT3, cN0, cM0) - Signed by [name] [name], MD on 01/18/2023 Stage prefix: Initial diagnosis  Right tonsillar squamous cell carcinoma (HCC) Staging form: Pharynx - P16 Negative Oropharynx, AJCC 8th Edition - Clinical stage  from 04/12/2023: Stage III (cT3, cN1, cM0, p16: Unknown) - Signed by [name] [name], MD on 04/12/2023 Stage prefix: Initial diagnosis   [name] [name], MD   09/22/2023 2:30 PM

## 2023-09-23 ENCOUNTER — Inpatient Hospital Stay

## 2023-09-23 DIAGNOSIS — C099 Malignant neoplasm of tonsil, unspecified: Secondary | ICD-10-CM | POA: Diagnosis not present

## 2023-09-23 DIAGNOSIS — C109 Malignant neoplasm of oropharynx, unspecified: Secondary | ICD-10-CM

## 2023-09-23 MED ORDER — ACETAMINOPHEN 325 MG PO TABS
650.0000 mg | ORAL_TABLET | Freq: Once | ORAL | Status: AC
  Administered 2023-09-23: 650 mg via ORAL
  Filled 2023-09-23: qty 2

## 2023-09-23 MED ORDER — DIPHENHYDRAMINE HCL 50 MG/ML IJ SOLN
25.0000 mg | Freq: Once | INTRAMUSCULAR | Status: AC
  Administered 2023-09-23: 25 mg via INTRAVENOUS
  Filled 2023-09-23: qty 1

## 2023-09-23 MED ORDER — SODIUM CHLORIDE 0.9% IV SOLUTION
250.0000 mL | INTRAVENOUS | Status: DC
  Administered 2023-09-23: 100 mL via INTRAVENOUS
  Filled 2023-09-23: qty 250

## 2023-09-23 NOTE — Patient Instructions (Signed)

## 2023-09-23 NOTE — Progress Notes (Signed)
 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  Has completed treatment for both. Recent admission to [facility] (8/8-8/12) with confusion, anemia, UTI.    Met with patient during blood infusion.  No family with her in infusion today.  Reports that her appetite is better nd she is eating good.  Said that she has eaten a chicken sandwich and 1/2 of biscuit today so far.  Agreeable for RD to speak with daughter in law that brought her.      Medications: reviewed  Labs: Hgb 7.4 on 8/14  [facility] labs back after discharge Zinc  56 on 8/8 and 53 on 8/10 Low Vit D 14.5 Low Copper 99 normal  Anthropometrics:   Weight 116 lb on 8/13 108 lb on 7/29 116 lb on 7/8 121 lb on 6/19 135 lb on 5/21 (bilateral edema) 127 lb on 5/2 114 lb on 4/18 122 lb on 4/14 121 lb on 4/11   NUTRITION DIAGNOSIS: Unintentional weight loss improved  Meets criteria for severe malnutrition  INTERVENTION:  Continue to push calories and protein Recommend 50 mg daily of zinc  (over the counter) and recheck in 14 days.  Recommend 50,000 International Units po weekly for 6 weeks and recheck 30 days after completion.  Message sent to MD regarding above recommendations.   MONITORING, EVALUATION, GOAL: weight trends, intake   NEXT VISIT: Thursday, Sept 11    Delta Pichon B. Dasie SOLON, CSO, LDN Registered Dietitian [phone]

## 2023-09-24 ENCOUNTER — Encounter: Payer: Self-pay | Admitting: Oncology

## 2023-09-24 LAB — TYPE AND SCREEN
ABO/RH(D): A POS
Antibody Screen: POSITIVE
Donor AG Type: NEGATIVE
Unit division: 0

## 2023-09-24 LAB — BPAM RBC
Blood Product Expiration Date: 202509132359
ISSUE DATE / TIME: 202508151323
Unit Type and Rh: 6200

## 2023-09-27 ENCOUNTER — Other Ambulatory Visit: Payer: Self-pay | Admitting: *Deleted

## 2023-09-27 ENCOUNTER — Encounter: Payer: Self-pay | Admitting: *Deleted

## 2023-09-27 DIAGNOSIS — C099 Malignant neoplasm of tonsil, unspecified: Secondary | ICD-10-CM

## 2023-09-27 MED ORDER — ERGOCALCIFEROL 1.25 MG (50000 UT) PO CAPS: 50000.0000 [IU] | ORAL_CAPSULE | ORAL | 0 refills | Status: DC

## 2023-09-27 MED ORDER — ZINC GLUCONATE 50 MG PO TABS: 50.0000 mg | ORAL_TABLET | Freq: Every day | ORAL | 0 refills | Status: DC

## 2023-09-27 MED ORDER — LISINOPRIL 20 MG PO TABS: 20.0000 mg | ORAL_TABLET | Freq: Every day | ORAL | 0 refills | Status: DC

## 2023-10-06 ENCOUNTER — Encounter: Payer: Self-pay | Admitting: Family Medicine

## 2023-10-06 ENCOUNTER — Encounter: Payer: Self-pay | Admitting: Oncology

## 2023-10-07 NOTE — Telephone Encounter (Signed)
 Would September 5th be okay? She would like to come on that date

## 2023-10-07 NOTE — Telephone Encounter (Signed)
 Please review if okay to schedule, she has seen you once before.

## 2023-10-07 NOTE — Telephone Encounter (Signed)
 Okay

## 2023-10-07 NOTE — Telephone Encounter (Signed)
 Schedule a visit September 9th

## 2023-10-13 ENCOUNTER — Other Ambulatory Visit

## 2023-10-14 ENCOUNTER — Encounter: Payer: Self-pay | Admitting: Family Medicine

## 2023-10-14 ENCOUNTER — Inpatient Hospital Stay: Attending: Radiation Oncology

## 2023-10-14 ENCOUNTER — Ambulatory Visit
Admission: RE | Admit: 2023-10-14 | Discharge: 2023-10-14 | Disposition: A | Discharge status: Home / Self Care | Source: Ambulatory Visit | Attending: Oncology | Admitting: Oncology

## 2023-10-14 ENCOUNTER — Ambulatory Visit (INDEPENDENT_AMBULATORY_CARE_PROVIDER_SITE_OTHER): Admitting: Family Medicine

## 2023-10-14 ENCOUNTER — Other Ambulatory Visit: Payer: Self-pay

## 2023-10-14 VITALS — BP 100/58 | HR 60 | Ht 66.0 in | Wt 103.0 lb

## 2023-10-14 DIAGNOSIS — C760 Malignant neoplasm of head, face and neck: Secondary | ICD-10-CM

## 2023-10-14 DIAGNOSIS — Z85048 Personal history of other malignant neoplasm of rectum, rectosigmoid junction, and anus: Secondary | ICD-10-CM | POA: Diagnosis present

## 2023-10-14 DIAGNOSIS — Z452 Encounter for adjustment and management of vascular access device: Secondary | ICD-10-CM | POA: Insufficient documentation

## 2023-10-14 DIAGNOSIS — D649 Anemia, unspecified: Secondary | ICD-10-CM

## 2023-10-14 DIAGNOSIS — C109 Malignant neoplasm of oropharynx, unspecified: Secondary | ICD-10-CM

## 2023-10-14 DIAGNOSIS — S0101XD Laceration without foreign body of scalp, subsequent encounter: Secondary | ICD-10-CM | POA: Diagnosis not present

## 2023-10-14 DIAGNOSIS — C099 Malignant neoplasm of tonsil, unspecified: Secondary | ICD-10-CM

## 2023-10-14 LAB — CBC WITH DIFFERENTIAL (CANCER CENTER ONLY)
Abs Immature Granulocytes: 0.03 K/uL (ref 0.00–0.07)
Basophils Absolute: 0 K/uL (ref 0.0–0.1)
Basophils Relative: 0 %
Eosinophils Absolute: 0 K/uL (ref 0.0–0.5)
Eosinophils Relative: 0 %
HCT: 27 % — ABNORMAL LOW (ref 36.0–46.0)
Hemoglobin: 8.7 g/dL — ABNORMAL LOW (ref 12.0–15.0)
Immature Granulocytes: 1 %
Lymphocytes Relative: 14 %
Lymphs Abs: 0.6 K/uL — ABNORMAL LOW (ref 0.7–4.0)
MCH: 30.6 pg (ref 26.0–34.0)
MCHC: 32.2 g/dL (ref 30.0–36.0)
MCV: 95.1 fL (ref 80.0–100.0)
Monocytes Absolute: 0.3 K/uL (ref 0.1–1.0)
Monocytes Relative: 8 %
Neutro Abs: 3.3 K/uL (ref 1.7–7.7)
Neutrophils Relative %: 77 %
Platelet Count: 137 K/uL — ABNORMAL LOW (ref 150–400)
RBC: 2.84 MIL/uL — ABNORMAL LOW (ref 3.87–5.11)
RDW: 16.2 % — ABNORMAL HIGH (ref 11.5–15.5)
WBC Count: 4.2 K/uL (ref 4.0–10.5)
nRBC: 0 % (ref 0.0–0.2)

## 2023-10-14 LAB — SAMPLE TO BLOOD BANK

## 2023-10-14 NOTE — Progress Notes (Signed)
 Established Patient Office Visit  Subjective   Patient ID: April [name], female    DOB: [birth year]  Age: 83 y.o. MRN: [id number]  Chief Complaint  Patient presents with   Suture / Staple Removal     Assessment & Plan:   Problem List Items Addressed This Visit   None Visit Diagnoses       Laceration of scalp without foreign body, subsequent encounter    -  Primary     Assessment and Plan Assessment & Plan Head laceration, status post suture removal Laceration healed, dry, no pain or complications. - Advised against scratching to prevent wound reopening. - Instructed to avoid bandage to prevent hair adhesion discomfort.    No follow-ups on file.   HPI Discussed the use of AI scribe software for clinical note transcription with the patient, who gave verbal consent to proceed.  History of Present Illness April [name] is an 83 year old female who presents for suture removal from her head after a fall. She is accompanied by her son, who is her primary caregiver.  Approximately a week  1/2 ago, she fell from her bed, she was seen at ER work up with CT head unremarkable, had head laceration requiring stitches on her head.      Review of Systems  All other systems reviewed and are negative.     Objective:     BP (!) 100/58   Pulse 60   Ht 5' 6 (1.676 m)   Wt 103 lb (46.7 kg)   SpO2 100%   BMI 16.62 kg/m     Physical Exam Vitals and nursing note reviewed.  Constitutional:      Appearance: Normal appearance.  HENT:     Head: Normocephalic.     Right Ear: External ear normal.     Left Ear: External ear normal.  Eyes:     Conjunctiva/sclera: Conjunctivae normal.  Cardiovascular:     Rate and Rhythm: Normal rate.  Pulmonary:     Effort: Pulmonary effort is normal. No respiratory distress.  Abdominal:     Palpations: Abdomen is soft.  Musculoskeletal:        General: Normal range of motion.  Skin:    General: Skin is warm.  Neurological:      Mental Status: She is alert and oriented to person, place, and time.  Psychiatric:        Mood and Affect: Mood normal.      Results for orders placed or performed in visit on 10/14/23  CBC with Differential (Cancer Center Only)  Result Value Ref Range   WBC Count 4.2 4.0 - 10.5 K/uL   RBC 2.84 (L) 3.87 - 5.11 MIL/uL   Hemoglobin 8.7 (L) 12.0 - 15.0 g/dL   HCT 72.9 (L) 63.9 - 53.9 %   MCV 95.1 80.0 - 100.0 fL   MCH 30.6 26.0 - 34.0 pg   MCHC 32.2 30.0 - 36.0 g/dL   RDW 83.7 (H) 88.4 - 84.4 %   Platelet Count 137 (L) 150 - 400 K/uL   nRBC 0.0 0.0 - 0.2 %   Neutrophils Relative % 77 %   Neutro Abs 3.3 1.7 - 7.7 K/uL   Lymphocytes Relative 14 %   Lymphs Abs 0.6 (L) 0.7 - 4.0 K/uL   Monocytes Relative 8 %   Monocytes Absolute 0.3 0.1 - 1.0 K/uL   Eosinophils Relative 0 %   Eosinophils Absolute 0.0 0.0 - 0.5 K/uL   Basophils Relative 0 %  Basophils Absolute 0.0 0.0 - 0.1 K/uL   Immature Granulocytes 1 %   Abs Immature Granulocytes 0.03 0.00 - 0.07 K/uL  Hold Tube- Blood Bank  Result Value Ref Range   Blood Bank Specimen SAMPLE AVAILABLE FOR TESTING    Sample Expiration      10/17/2023,2359 Performed at [facility], [address], [place], [place] [address]        The ASCVD Risk score (Arnett DK, et al., 2019) failed to calculate for the following reasons:   The 2019 ASCVD risk score is only valid for ages 38 to 9      Ladoris MARLA Ny, MD

## 2023-10-16 LAB — ZINC: Zinc: 94 ug/dL (ref 44–115)

## 2023-10-17 ENCOUNTER — Ambulatory Visit
Admission: RE | Admit: 2023-10-17 | Discharge: 2023-10-17 | Disposition: A | Discharge status: Home / Self Care | Source: Ambulatory Visit | Attending: Oncology | Admitting: Oncology

## 2023-10-17 DIAGNOSIS — C760 Malignant neoplasm of head, face and neck: Secondary | ICD-10-CM | POA: Insufficient documentation

## 2023-10-17 DIAGNOSIS — I872 Venous insufficiency (chronic) (peripheral): Secondary | ICD-10-CM | POA: Diagnosis not present

## 2023-10-17 DIAGNOSIS — Z9181 History of falling: Secondary | ICD-10-CM | POA: Insufficient documentation

## 2023-10-17 DIAGNOSIS — Z85048 Personal history of other malignant neoplasm of rectum, rectosigmoid junction, and anus: Secondary | ICD-10-CM | POA: Diagnosis not present

## 2023-10-17 DIAGNOSIS — C109 Malignant neoplasm of oropharynx, unspecified: Secondary | ICD-10-CM | POA: Insufficient documentation

## 2023-10-17 DIAGNOSIS — C099 Malignant neoplasm of tonsil, unspecified: Secondary | ICD-10-CM | POA: Insufficient documentation

## 2023-10-17 LAB — GLUCOSE, CAPILLARY: Glucose-Capillary: 86 mg/dL (ref 70–99)

## 2023-10-17 MED ORDER — HEPARIN SOD (PORK) LOCK FLUSH 100 UNIT/ML IV SOLN
500.0000 [IU] | INTRAVENOUS | Status: AC | PRN
  Administered 2023-10-17: 500 [IU]

## 2023-10-17 MED ORDER — FLUDEOXYGLUCOSE F - 18 (FDG) INJECTION
6.0000 | Freq: Once | INTRAVENOUS | Status: AC | PRN
Start: 2023-10-17 — End: 2023-10-17
  Administered 2023-10-17: 6.36 via INTRAVENOUS

## 2023-10-18 ENCOUNTER — Encounter: Payer: Self-pay | Admitting: Oncology

## 2023-10-20 ENCOUNTER — Inpatient Hospital Stay: Admitting: Oncology

## 2023-10-20 ENCOUNTER — Inpatient Hospital Stay

## 2023-10-20 ENCOUNTER — Inpatient Hospital Stay: Admitting: Hospice and Palliative Medicine

## 2023-10-25 ENCOUNTER — Other Ambulatory Visit: Payer: Self-pay | Admitting: Oncology

## 2023-10-30 ENCOUNTER — Inpatient Hospital Stay
Admission: EM | Admit: 2023-10-30 | Discharge: 2023-11-02 | DRG: 640 | Disposition: A | Discharge status: Hospice | Attending: Internal Medicine | Admitting: Internal Medicine

## 2023-10-30 ENCOUNTER — Encounter: Payer: Self-pay | Admitting: Oncology

## 2023-10-30 ENCOUNTER — Emergency Department

## 2023-10-30 ENCOUNTER — Other Ambulatory Visit: Payer: Self-pay

## 2023-10-30 DIAGNOSIS — E041 Nontoxic single thyroid nodule: Secondary | ICD-10-CM | POA: Diagnosis present

## 2023-10-30 DIAGNOSIS — C099 Malignant neoplasm of tonsil, unspecified: Secondary | ICD-10-CM | POA: Diagnosis present

## 2023-10-30 DIAGNOSIS — E1122 Type 2 diabetes mellitus with diabetic chronic kidney disease: Secondary | ICD-10-CM | POA: Diagnosis present

## 2023-10-30 DIAGNOSIS — L89322 Pressure ulcer of left buttock, stage 2: Secondary | ICD-10-CM | POA: Diagnosis present

## 2023-10-30 DIAGNOSIS — L89152 Pressure ulcer of sacral region, stage 2: Secondary | ICD-10-CM | POA: Diagnosis present

## 2023-10-30 DIAGNOSIS — R000 Tachycardia, unspecified: Secondary | ICD-10-CM | POA: Diagnosis present

## 2023-10-30 DIAGNOSIS — I129 Hypertensive chronic kidney disease with stage 1 through stage 4 chronic kidney disease, or unspecified chronic kidney disease: Secondary | ICD-10-CM | POA: Diagnosis present

## 2023-10-30 DIAGNOSIS — D72819 Decreased white blood cell count, unspecified: Secondary | ICD-10-CM | POA: Diagnosis present

## 2023-10-30 DIAGNOSIS — Z66 Do not resuscitate: Secondary | ICD-10-CM | POA: Diagnosis present

## 2023-10-30 DIAGNOSIS — R630 Anorexia: Secondary | ICD-10-CM | POA: Diagnosis present

## 2023-10-30 DIAGNOSIS — D638 Anemia in other chronic diseases classified elsewhere: Secondary | ICD-10-CM | POA: Diagnosis present

## 2023-10-30 DIAGNOSIS — E861 Hypovolemia: Secondary | ICD-10-CM | POA: Diagnosis present

## 2023-10-30 DIAGNOSIS — E11649 Type 2 diabetes mellitus with hypoglycemia without coma: Secondary | ICD-10-CM | POA: Diagnosis present

## 2023-10-30 DIAGNOSIS — R54 Age-related physical debility: Secondary | ICD-10-CM | POA: Diagnosis present

## 2023-10-30 DIAGNOSIS — R627 Adult failure to thrive: Principal | ICD-10-CM | POA: Diagnosis present

## 2023-10-30 DIAGNOSIS — N1832 Chronic kidney disease, stage 3b: Secondary | ICD-10-CM | POA: Diagnosis present

## 2023-10-30 DIAGNOSIS — E785 Hyperlipidemia, unspecified: Secondary | ICD-10-CM | POA: Diagnosis present

## 2023-10-30 DIAGNOSIS — Z7401 Bed confinement status: Secondary | ICD-10-CM

## 2023-10-30 DIAGNOSIS — Z515 Encounter for palliative care: Secondary | ICD-10-CM | POA: Diagnosis not present

## 2023-10-30 DIAGNOSIS — Z681 Body mass index (BMI) 19 or less, adult: Secondary | ICD-10-CM | POA: Diagnosis not present

## 2023-10-30 DIAGNOSIS — Z7952 Long term (current) use of systemic steroids: Secondary | ICD-10-CM

## 2023-10-30 DIAGNOSIS — E162 Hypoglycemia, unspecified: Secondary | ICD-10-CM

## 2023-10-30 DIAGNOSIS — Z923 Personal history of irradiation: Secondary | ICD-10-CM

## 2023-10-30 DIAGNOSIS — Z7982 Long term (current) use of aspirin: Secondary | ICD-10-CM | POA: Diagnosis not present

## 2023-10-30 DIAGNOSIS — E43 Unspecified severe protein-calorie malnutrition: Secondary | ICD-10-CM | POA: Diagnosis present

## 2023-10-30 DIAGNOSIS — E1169 Type 2 diabetes mellitus with other specified complication: Secondary | ICD-10-CM | POA: Diagnosis present

## 2023-10-30 DIAGNOSIS — Z79899 Other long term (current) drug therapy: Secondary | ICD-10-CM

## 2023-10-30 DIAGNOSIS — R64 Cachexia: Secondary | ICD-10-CM | POA: Diagnosis present

## 2023-10-30 DIAGNOSIS — Z9049 Acquired absence of other specified parts of digestive tract: Secondary | ICD-10-CM

## 2023-10-30 DIAGNOSIS — Z87891 Personal history of nicotine dependence: Secondary | ICD-10-CM

## 2023-10-30 DIAGNOSIS — C210 Malignant neoplasm of anus, unspecified: Secondary | ICD-10-CM | POA: Diagnosis present

## 2023-10-30 DIAGNOSIS — Z7984 Long term (current) use of oral hypoglycemic drugs: Secondary | ICD-10-CM

## 2023-10-30 DIAGNOSIS — E119 Type 2 diabetes mellitus without complications: Secondary | ICD-10-CM

## 2023-10-30 DIAGNOSIS — Z7901 Long term (current) use of anticoagulants: Secondary | ICD-10-CM | POA: Diagnosis not present

## 2023-10-30 DIAGNOSIS — C801 Malignant (primary) neoplasm, unspecified: Secondary | ICD-10-CM

## 2023-10-30 DIAGNOSIS — L8990 Pressure ulcer of unspecified site, unspecified stage: Secondary | ICD-10-CM | POA: Insufficient documentation

## 2023-10-30 DIAGNOSIS — I10 Essential (primary) hypertension: Secondary | ICD-10-CM | POA: Diagnosis present

## 2023-10-30 HISTORY — DX: Encounter for other specified aftercare: Z51.89

## 2023-10-30 LAB — COMPREHENSIVE METABOLIC PANEL WITH GFR
ALT: 7 U/L (ref 0–44)
AST: 19 U/L (ref 15–41)
Albumin: 2.6 g/dL — ABNORMAL LOW (ref 3.5–5.0)
Alkaline Phosphatase: 52 U/L (ref 38–126)
Anion gap: 10 (ref 5–15)
BUN: 15 mg/dL (ref 8–23)
CO2: 26 mmol/L (ref 22–32)
Calcium: 8.8 mg/dL — ABNORMAL LOW (ref 8.9–10.3)
Chloride: 107 mmol/L (ref 98–111)
Creatinine, Ser: 1.19 mg/dL — ABNORMAL HIGH (ref 0.44–1.00)
GFR, Estimated: 45 mL/min — ABNORMAL LOW (ref >60)
Glucose, Bld: 85 mg/dL (ref 70–99)
Potassium: 3.5 mmol/L (ref 3.5–5.1)
Sodium: 143 mmol/L (ref 135–145)
Total Bilirubin: 1.2 mg/dL (ref 0.0–1.2)
Total Protein: 6 g/dL — ABNORMAL LOW (ref 6.5–8.1)

## 2023-10-30 LAB — CBG MONITORING, ED
Glucose-Capillary: 61 mg/dL — ABNORMAL LOW (ref 70–99)
Glucose-Capillary: 69 mg/dL — ABNORMAL LOW (ref 70–99)

## 2023-10-30 LAB — CBC
HCT: 30.6 % — ABNORMAL LOW (ref 36.0–46.0)
Hemoglobin: 9 g/dL — ABNORMAL LOW (ref 12.0–15.0)
MCH: 30.2 pg (ref 26.0–34.0)
MCHC: 29.4 g/dL — ABNORMAL LOW (ref 30.0–36.0)
MCV: 102.7 fL — ABNORMAL HIGH (ref 80.0–100.0)
Platelets: 112 K/uL — ABNORMAL LOW (ref 150–400)
RBC: 2.98 MIL/uL — ABNORMAL LOW (ref 3.87–5.11)
RDW: 16.2 % — ABNORMAL HIGH (ref 11.5–15.5)
WBC: 3.1 K/uL — ABNORMAL LOW (ref 4.0–10.5)
nRBC: 0 % (ref 0.0–0.2)

## 2023-10-30 LAB — GLUCOSE, CAPILLARY
Glucose-Capillary: 80 mg/dL (ref 70–99)
Glucose-Capillary: 101 mg/dL — ABNORMAL HIGH (ref 70–99)

## 2023-10-30 LAB — LACTIC ACID, PLASMA: Lactic Acid, Venous: 0.7 mmol/L (ref 0.5–1.9)

## 2023-10-30 MED ORDER — LORAZEPAM 2 MG/ML IJ SOLN
1.0000 mg | Freq: Once | INTRAMUSCULAR | Status: AC
  Administered 2023-10-30: 1 mg via INTRAMUSCULAR
  Filled 2023-10-30: qty 1

## 2023-10-30 MED ORDER — IOHEXOL 300 MG/ML  SOLN
75.0000 mL | Freq: Once | INTRAMUSCULAR | Status: AC | PRN
  Administered 2023-10-30: 75 mL via INTRAVENOUS

## 2023-10-30 MED ORDER — SENNOSIDES-DOCUSATE SODIUM 8.6-50 MG PO TABS: 1.0000 | ORAL_TABLET | Freq: Every evening | ORAL | Status: DC | PRN

## 2023-10-30 MED ORDER — ONDANSETRON HCL 4 MG PO TABS: 4.0000 mg | ORAL_TABLET | Freq: Four times a day (QID) | ORAL | Status: DC | PRN

## 2023-10-30 MED ORDER — INSULIN ASPART 100 UNIT/ML IJ SOLN: 0.0000 [IU] | Freq: Every day | INTRAMUSCULAR | Status: DC

## 2023-10-30 MED ORDER — DEXTROSE IN LACTATED RINGERS 5 % IV SOLN: INTRAVENOUS | Status: DC

## 2023-10-30 MED ORDER — DEXTROSE 50 % IV SOLN: 1.0000 | INTRAVENOUS | Status: DC | PRN

## 2023-10-30 MED ORDER — SODIUM CHLORIDE 0.9% FLUSH
10.0000 mL | Freq: Two times a day (BID) | INTRAVENOUS | Status: DC
  Administered 2023-10-30 – 2023-10-31 (×2): 10 mL
  Administered 2023-11-01: 40 mL
  Administered 2023-11-01: 10 mL
  Administered 2023-11-02: 40 mL

## 2023-10-30 MED ORDER — DIPHENOXYLATE-ATROPINE 2.5-0.025 MG PO TABS: 1.0000 | ORAL_TABLET | Freq: Four times a day (QID) | ORAL | Status: DC | PRN

## 2023-10-30 MED ORDER — ONDANSETRON HCL 4 MG/2ML IJ SOLN: 4.0000 mg | Freq: Four times a day (QID) | INTRAMUSCULAR | Status: DC | PRN

## 2023-10-30 MED ORDER — ACETAMINOPHEN 325 MG PO TABS: 650.0000 mg | ORAL_TABLET | Freq: Four times a day (QID) | ORAL | Status: DC | PRN

## 2023-10-30 MED ORDER — CHLORHEXIDINE GLUCONATE CLOTH 2 % EX PADS
6.0000 | MEDICATED_PAD | Freq: Every day | CUTANEOUS | Status: DC
  Administered 2023-10-31 – 2023-11-02 (×4): 6 via TOPICAL

## 2023-10-30 MED ORDER — ENOXAPARIN SODIUM 30 MG/0.3ML IJ SOSY
30.0000 mg | PREFILLED_SYRINGE | Freq: Every day | INTRAMUSCULAR | Status: DC
  Administered 2023-10-30 – 2023-10-31 (×2): 30 mg via SUBCUTANEOUS
  Filled 2023-10-30 (×2): qty 0.3

## 2023-10-30 MED ORDER — HYDRALAZINE HCL 20 MG/ML IJ SOLN
5.0000 mg | Freq: Four times a day (QID) | INTRAMUSCULAR | Status: DC | PRN
Start: 2023-10-30 — End: 2023-11-04

## 2023-10-30 MED ORDER — INSULIN ASPART 100 UNIT/ML IJ SOLN: 0.0000 [IU] | Freq: Three times a day (TID) | INTRAMUSCULAR | Status: DC

## 2023-10-30 MED ORDER — ENSURE PLUS HIGH PROTEIN PO LIQD
237.0000 mL | Freq: Two times a day (BID) | ORAL | Status: DC
  Administered 2023-11-02: 237 mL via ORAL

## 2023-10-30 MED ORDER — METOPROLOL TARTRATE 5 MG/5ML IV SOLN: 5.0000 mg | INTRAVENOUS | Status: DC | PRN

## 2023-10-30 MED ORDER — CARVEDILOL 6.25 MG PO TABS: 3.1250 mg | ORAL_TABLET | Freq: Two times a day (BID) | ORAL | Status: DC

## 2023-10-30 MED ORDER — KCL IN DEXTROSE-NACL 20-5-0.45 MEQ/L-%-% IV SOLN: Freq: Once | INTRAVENOUS | Status: DC

## 2023-10-30 MED ORDER — ACETAMINOPHEN 650 MG RE SUPP: 650.0000 mg | Freq: Four times a day (QID) | RECTAL | Status: DC | PRN

## 2023-10-30 MED ORDER — SODIUM CHLORIDE 0.9% FLUSH: 10.0000 mL | INTRAVENOUS | Status: DC | PRN

## 2023-10-30 MED ORDER — MIRTAZAPINE 15 MG PO TABS
7.5000 mg | ORAL_TABLET | Freq: Every day | ORAL | Status: DC
Start: 2023-10-30 — End: 2023-11-02
  Filled 2023-10-30: qty 1

## 2023-10-30 MED ORDER — OXYCODONE HCL 5 MG PO TABS
5.0000 mg | ORAL_TABLET | ORAL | Status: DC | PRN
Start: 2023-10-30 — End: 2023-11-02

## 2023-10-30 NOTE — Assessment & Plan Note (Signed)
 Patient is on metformin  500 mg daily with breakfast and 1000 mg daily with supper Home metformin  will not be resumed on admission Insulin  SSI with at bedtime coverage, thin, dosing ordered Goal inpatient blood glucose levels 140-180

## 2023-10-30 NOTE — Assessment & Plan Note (Signed)
 Home simvastatin  20 mg daily not resumed on admission Team to resume when patient willing to take p.o. intake

## 2023-10-30 NOTE — Assessment & Plan Note (Addendum)
 With hypermetabolism , ultrasound and biopsy have been deferred pending completion of treatment of 2 malignancies above

## 2023-10-30 NOTE — Assessment & Plan Note (Signed)
 Patient completed final cisplatin  treatment on 07/28/2023. Patient completed XRT on 08/04/2023

## 2023-10-30 NOTE — ED Notes (Signed)
 Patient transported to CT

## 2023-10-30 NOTE — Assessment & Plan Note (Addendum)
 Secondary to poor p.o. intake, failure to thrive CBG every 2 hours, 2 times check ordered ending at 2000 hours CBG every 4 hours starting at 2200 hrs., 3 times check ordered on admission Continue Accu-Chek 3 times daily AC  Addendum at approximately 17:15: Received message from RN that patient's blood glucose level is 69.  D5 at 40 mL/h has been hung for about 15 minutes.  I discussed with RN to increase D5 to 75 mL/h.  D50, 1 amp, as needed for hypoglycemia initiated.  Continue close glucose monitoring as appropriate.

## 2023-10-30 NOTE — H&P (Addendum)
 History and Physical   April [name] FMW:[id number] DOB: Jul 20, 1940 DOA: 10/30/2023  PCP: Salli Amato, MD  Outpatient Specialists: Dr. [name], medical oncology Patient coming from: Home  I have personally briefly reviewed patient's old medical records in [facility] EMR.  Chief Concern: No appetite  HPI: Ms. 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  Vitals in the ED showed temperature of 97.4, respiration rate 20, heart rate 70, blood pressure 131/64, SpO2 100% on room air.  Serum sodium is 143, potassium 3.5, chloride 107, bicarb 26, BUN of 15, serum creatinine 1.19, eGFR 45, nonfasting blood glucose 85, WBC 3.1, hemoglobin 9.0, platelets of 112.  Initial CBG showed 61.  Per nursing documentation, patient received orange juice.  Lactic acid of 0.7.  Per nursing documentation, patient was drinking coffee with sugar and cream.  ED treatment: Ativan  1 mg IV one-time dose. --------------------------------------- At bedside patient is nonverbal and refuses to open her eyes.  She mumbles incoherently when I call out to her.  She would not open her mouth.  Social history: She currently lives at home with her son and daughter-in-law.  Patient has a remote history of tobacco use however does not use tobacco products for a long time.  Patient does not use EtOH or recreational drug use.  She is retired and previously worked as a Midwife.  ROS: Unable to complete due to acuity of patient presentation  ED Course: Discussed with EDP, patient requiring hospitalization for chief concerns of  hypoglycemia.  Assessment/Plan  Principal Problem:   Failure to thrive in adult Active Problems:   Anal squamous cell carcinoma (HCC)   Right tonsillar squamous cell carcinoma (HCC)   Hypoglycemia   Thyroid  nodule   Type 2 diabetes mellitus without complications (HCC)   Essential hypertension   Hyperlipidemia due to type 2 diabetes mellitus (HCC)   Stage 3b chronic kidney disease (HCC)   Anemia of chronic disease   Assessment and Plan:  * Failure to thrive in adult Likely multifactorial in setting of 2 squamous cell carcinoma (anus and right tonsil) with significant weight loss Family has requested palliative care to be consulted as they would like to understand the difference between palliative, hospice, end-of-life care I discussed that we will do D5 IV fluid infusion to treat her hypoglycemia however given patient's refusal to eat or inability to tolerate p.o. intake, the next step would be to initiate tube feeds which does not increase lifespan or quality of life.  Family does not know what to do at this time.  They will make a decision after discussion with palliative care Strict I's and O's  Hypoglycemia Secondary to poor p.o. intake, failure to thrive CBG every 2 hours, 2 times check ordered ending at 2000 hours CBG every 4 hours starting at 2200 hrs., 3 times check ordered on admission Continue Accu-Chek 3 times daily AC  Addendum at approximately 17:15: Received message from RN that patient's blood glucose level is 69.  D5 at 40 mL/h has been hung for about 15 minutes.  I discussed with RN to increase D5 to 75 mL/h.  D50, 1 amp, as needed for hypoglycemia initiated.  Continue  close glucose monitoring as appropriate.  Right tonsillar squamous cell carcinoma [facility]) Patient completed final cisplatin  treatment on 07/28/2023. 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  She completed Xeloda  and XRT on 03/25/2023.  Thyroid  nodule With hypermetabolism , ultrasound and biopsy have been deferred pending completion of treatment of 2 malignancies above  Anemia of chronic disease At baseline, no indication of acute bleeding at this time Recheck CBC in a.m.  Stage 3b chronic kidney disease (HCC) At baseline  Hyperlipidemia due to type 2 diabetes mellitus (HCC) Home simvastatin  20 mg daily not resumed on admission Team to resume when patient willing to take p.o. intake  Essential hypertension Home scheduled antihypertensive medication not resumed on admission as patient is refusing p.o. medications: Lisinopril -hydrochlorothiazide  20-25 mg daily, amlodipine 10 mg daily, Coreg  3.125 mg p.o. twice daily A.m. team to resume when benefits outweigh the risk Hydralazine  5 mg IV every 6 hours as needed for SBP greater 170, 5 days ordered  Type 2 diabetes mellitus without complications (HCC) Patient is on metformin  500 mg daily with breakfast and 1000 mg daily with supper Home metformin  will not be resumed on admission Insulin  SSI with at bedtime coverage, thin, dosing ordered Goal inpatient blood glucose levels 140-180  Chart reviewed.   DVT prophylaxis: Enoxaparin  Code Status: DNR/DNI.  Son, Antonio at bedside states that he lives that she would want to be let go naturally, have a natural death Diet: Full liquid diet as tolerated Family Communication: Extensive discussion with son, Antonio and daughter-in-law Almetta at bedside Disposition Plan: Pending clinical course Consults called: Registered dietitian Admission status: Telemetry medical, inpatient  Past Medical History:  Diagnosis Date   Anemia    Blood transfusion without reported diagnosis    Diabetes mellitus without complication (HCC)    Gout    Hyperlipidemia    Hypertension    Wears dentures    full upper and lower   Past Surgical History:  Procedure Laterality  Date   APPENDECTOMY     gallstones  06/12/2009   MICROLARYNGOSCOPY Right 01/28/2023   Procedure: MICRODIRECT LARYNGOSCOPY WITH BIOPSY OF OROPHARYNGEAL MASS;  Surgeon: Herminio Miu, MD;  Location: [facility];  Service: ENT;  Laterality: Right;  Diabetic   PORTACATH PLACEMENT N/A 02/18/2023   Procedure: INSERTION PORT-A-CATH;  Surgeon: Lane Shope, MD;  Location: [facility];  Service: General;  Laterality: N/A;   TONSILLECTOMY     Social History:  reports that she quit smoking about 35 years ago. Her smoking use included cigarettes. She started smoking about 43 years ago. She has a 4 pack-year smoking history. She has been exposed to tobacco smoke. She has never used smokeless tobacco. She reports that she does not drink alcohol and does not use drugs.  No Known Allergies Family History  Problem Relation Age of Onset   Breast cancer Neg Hx    Family history: Family history reviewed and not pertinent.  Prior to Admission medications   Medication Sig Start Date End Date Taking? Authorizing Provider  amLODipine (NORVASC) 10 MG tablet Take 10 mg by mouth daily.   Yes [provider]  apixaban  (ELIQUIS ) 2.5 MG TABS tablet Take 1 tablet (2.5 mg total) by mouth 2 (two) times daily. 08/23/23  Yes [name] [name], MD  aspirin 81 MG tablet Take 1 tablet by mouth daily.   Yes [provider]  carvedilol  (COREG ) 3.125 MG tablet Take 1 tablet by mouth 2 (two) times daily. cardio 09/23/21  Yes [provider]  cephALEXin (KEFLEX) 500 MG capsule Take 500 mg by mouth 3 (three) times daily. 09/20/23  Yes [provider]  cyanocobalamin  (VITAMIN B12) 1000 MCG/ML injection Inject 1,000 mcg into the muscle daily. 08/04/22  Yes [provider]  diphenoxylate -atropine  (LOMOTIL ) 2.5-0.025 MG tablet Take 1 tablet by mouth 4 (four) times daily as needed for diarrhea or loose stools. 08/23/23  Yes [name] [name], MD  ergocalciferol  (VITAMIN D2) 1.25 MG  (50000 UT) capsule Take 1 capsule (50,000 Units total) by mouth once a week. 09/27/23  Yes [name] [name], MD  hydrochlorothiazide  (MICROZIDE ) 12.5 MG capsule Take 12.5 mg by mouth daily. 09/04/23  Yes [provider]  lidocaine  (XYLOCAINE ) 2 % solution Use as directed 15 mLs in the mouth or throat as needed for mouth pain. 09/20/23  Yes [provider]  lisinopril  (ZESTRIL ) 20 MG tablet TAKE 1 TABLET(20 MG) BY MOUTH DAILY 10/25/23  Yes Finnegan, Timothy J, MD  lisinopril -hydrochlorothiazide  (ZESTORETIC ) 20-25 MG tablet Take 1 tablet by mouth daily.   Yes [provider]  magic mouthwash (multi-ingredient) oral suspension Swish and swallow 5-10 mLs 4 (four) times daily as needed. 08/23/23  Yes [name] [name], MD  metFORMIN  (GLUCOPHAGE ) 500 MG tablet Take 500 mg by mouth 2 (two) times daily with a meal.   Yes [provider]  mirtazapine  (REMERON ) 7.5 MG tablet Take 7.5 mg by mouth at bedtime. 09/20/23  Yes [provider]  naloxone  (NARCAN ) nasal spray 4 mg/0.1 mL SPRAY 1 SPRAY INTO ONE NOSTRIL AS DIRECTED FOR OPIOID OVERDOSE (TURN PERSON ON SIDE AFTER DOSE. IF NO RESPONSE IN 2-3 MINUTES OR PERSON RESPONDS BUT RELAPSES, REPEAT USING A NEW SPRAY DEVICE AND SPRAY INTO THE OTHER NOSTRIL. CALL 911 AFTER USE.) * EMERGENCY USE ONLY * 09/06/23  Yes Borders, Fonda SAUNDERS, NP  nystatin  (MYCOSTATIN ) 100000 UNIT/ML suspension Take 5 mLs by mouth 4 (four) times daily. 09/20/23  Yes [provider]  oxyCODONE  (OXY IR/ROXICODONE ) 5 MG immediate release tablet Take 1 tablet (5 mg total) by mouth every 4 (four) hours as needed for severe pain (pain score 7-10). 09/05/23  Yes Borders, Fonda SAUNDERS, NP  senna (SENOKOT) 8.6 MG tablet Take 2 tablets by mouth daily. 09/20/23  Yes [provider]  simvastatin  (ZOCOR ) 20 MG tablet Take 20 mg by mouth daily.   Yes [provider]  tiZANidine  (ZANAFLEX ) 4 MG tablet Take 1 tablet (4 mg total) by mouth at bedtime.  08/16/23  Yes Kotturi, Vinay K, MD  colchicine  0.6 MG tablet Take by mouth. Patient not taking: Reported on 10/30/2023 07/04/23   [provider]  dexamethasone  (DECADRON ) 2 MG tablet Take 1 tablet (2 mg total) by mouth 2 (two) times daily with a meal. 08/23/23   [name] [name], MD  esomeprazole (NEXIUM) 40 MG capsule Take 40 mg by mouth daily. Patient not taking: Reported on 10/30/2023 10/04/23   [provider]  mirtazapine  (REMERON ) 15 MG tablet Take 15 mg by mouth at bedtime. Patient not taking: Reported on 10/30/2023 08/30/23   [provider]  potassium chloride  (KLOR-CON ) 10 MEQ tablet Take 10 mEq by mouth 2 (two) times daily. Patient not taking: Reported on 10/30/2023 10/04/23   [provider]  sucralfate  (CARAFATE ) 1 GM/10ML suspension Take 10 mLs (1 g total) by mouth 4 (four) times daily -  with meals and at bedtime.  Patient not taking: Reported on 10/30/2023 09/06/23   Borders, Fonda SAUNDERS, NP  zinc  gluconate 50 MG tablet Take 1 tablet (50 mg total) by mouth daily. Patient not taking: Reported on 10/30/2023 09/27/23   [name] [name], MD   Physical Exam: Vitals:   10/30/23 1530 10/30/23 1545 10/30/23 1600 10/30/23 1630  BP: (!) 156/70  (!) 146/60 (!) 113/59  Pulse: 77  75 62  Resp: 14 17  14   Temp:      TempSrc:      SpO2: 100%  100% 100%  Weight:      Height:       Constitutional: appears frail, cachectic, weak Eyes: Unable to assess pupils, conjunctival as patient refused to open her eyes HENMT: Bilateral temporal wasting, mucous membranes are moist. Posterior pharynx clear of any exudate or lesions.  Unable to assess dentition.  Unable to assess hearing. Neck: normal, supple, no masses, no thyromegaly Respiratory: clear to auscultation bilaterally, no wheezing, no crackles. Normal respiratory effort. No accessory muscle use.  Cardiovascular: Regular rate and rhythm, no murmurs / rubs / gallops. No extremity edema. 2+ pedal pulses. No carotid  bruits.  Abdomen: no tenderness, no masses palpated, no hepatosplenomegaly. Bowel sounds positive.  Musculoskeletal: no clubbing / cyanosis. No joint deformity upper and lower extremities. Good ROM, no contractures, no atrophy. Normal muscle tone.  Skin: no rashes, lesions, ulcers. No induration Neurologic: Unable to assess sensation, strength.  Psychiatric: Unable to assess judgment, insight, alertness, orientation, mood consistent with patient with failure to thrive.   EKG: Not indicated at this time  Chest x-ray on Admission: I personally reviewed and I agree with radiologist reading as below.  CT ABDOMEN PELVIS W CONTRAST Result Date: 10/30/2023 CLINICAL DATA:  Concern for small bowel obstruction. EXAM: CT ABDOMEN AND PELVIS WITH CONTRAST TECHNIQUE: Multidetector CT imaging of the abdomen and pelvis was performed using the standard protocol following bolus administration of intravenous contrast. RADIATION DOSE REDUCTION: This exam was performed according to the departmental dose-optimization program which includes automated exposure control, adjustment of the mA and/or kV according to patient size and/or use of iterative reconstruction technique. CONTRAST:  75mL OMNIPAQUE  IOHEXOL  300 MG/ML  SOLN COMPARISON:  10/30/2023, 10/17/2023. FINDINGS: Lower chest: The heart is enlarged and coronary artery calcification is noted. There is calcification of mitral valve annulus. Atelectasis is present at the lung bases bilaterally. Hepatobiliary: No focal liver abnormality is seen. Status post cholecystectomy. No biliary dilatation. Pancreas: Unremarkable. No pancreatic ductal dilatation or surrounding inflammatory changes. Spleen: Normal size. A few scattered hypodensities are noted, statistically most likely representing cysts or hemangiomas. Adrenals/Urinary Tract: The adrenal glands are within normal limits. The kidneys enhance symmetrically. Cysts are noted in the kidneys bilaterally. Additional  subcentimeter hypodensities are present in the kidneys which are too small to further characterize. No renal calculus or hydronephrosis bilaterally. The bladder is unremarkable. Stomach/Bowel: The stomach is decompressed and otherwise within normal limits. No bowel obstruction, free air, or pneumatosis is seen. Scattered diverticula are present along the colon without evidence of diverticulitis. Appendix is not seen. Pelvic floor laxity is noted. There is thickening of the walls of the rectum and anal region with perirectal fat stranding. Vascular/Lymphatic: Aortic atherosclerosis. No enlarged abdominal or pelvic lymph nodes. Reproductive: Uterus and bilateral adnexa are unremarkable. Other: No abdominopelvic ascites.  Mild anasarca is present. Musculoskeletal: Degenerative changes are noted in the thoracolumbar spine. No acute osseous abnormality is seen. IMPRESSION: 1. No evidence of small-bowel obstruction. 2. Thickening of the walls  of the rectum and anorectal junction which may be associated with known mass. Superimposed proctitis cannot be excluded. 3. Mild anasarca. 4. Diverticulosis without diverticulitis. 5. Aortic atherosclerosis. Electronically Signed   By: Leita Birmingham M.D.   On: 10/30/2023 15:13   DG Abdomen 1 View Result Date: 10/30/2023 CLINICAL DATA:  evaluate for pna Evaluate for pna per ordering provider. Patients family member states patient lack of appetite. EXAM: PORTABLE CHEST and abdomen 1 VIEW COMPARISON:  Chest x-ray 02/18/2023, x-ray abdomen 10/30/2023, PET CT 04/05/2023 FINDINGS: Right chest wall Port-A-Cath with tip overlying the superior cavoatrial junction. The heart and mediastinal contours are unchanged. Atherosclerotic plaque. Severe mitral annular calcification. No focal consolidation. Chronic coarsened interstitial markings with no overt pulmonary edema. No pleural effusion. No pneumothorax. No acute osseous abnormality. Multiple loops of small bowel dilated with gas. Right  upper quadrant surgical clips. Surgical changes overlie the right pelvis. IMPRESSION: 1. No acute cardiopulmonary abnormality. 2. Gaseous dilatation of multiple loops of small bowel. Finding could represent small bowel obstructions. 3. Aortic Atherosclerosis (ICD10-I70.0) including mitral annular calcification. Electronically Signed   By: Morgane  Naveau M.D.   On: 10/30/2023 11:03   DG Chest Portable 1 View Result Date: 10/30/2023 CLINICAL DATA:  evaluate for pna Evaluate for pna per ordering provider. Patients family member states patient lack of appetite. EXAM: PORTABLE CHEST and abdomen 1 VIEW COMPARISON:  Chest x-ray 02/18/2023, x-ray abdomen 10/30/2023, PET CT 04/05/2023 FINDINGS: Right chest wall Port-A-Cath with tip overlying the superior cavoatrial junction. The heart and mediastinal contours are unchanged. Atherosclerotic plaque. Severe mitral annular calcification. No focal consolidation. Chronic coarsened interstitial markings with no overt pulmonary edema. No pleural effusion. No pneumothorax. No acute osseous abnormality. Multiple loops of small bowel dilated with gas. Right upper quadrant surgical clips. Surgical changes overlie the right pelvis. IMPRESSION: 1. No acute cardiopulmonary abnormality. 2. Gaseous dilatation of multiple loops of small bowel. Finding could represent small bowel obstructions. 3. Aortic Atherosclerosis (ICD10-I70.0) including mitral annular calcification. Electronically Signed   By: Morgane  Naveau M.D.   On: 10/30/2023 11:03   Labs on Admission: I have personally reviewed following labs  CBC: Recent Labs  Lab 10/30/23 1142  WBC 3.1*  HGB 9.0*  HCT 30.6*  MCV 102.7*  PLT 112*   Basic Metabolic Panel: Recent Labs  Lab 10/30/23 1142  NA 143  K 3.5  CL 107  CO2 26  GLUCOSE 85  BUN 15  CREATININE 1.19*  CALCIUM 8.8*   GFR: Estimated Creatinine Clearance: 26.4 mL/min (A) (by C-G formula based on SCr of 1.19 mg/dL (H)).  Liver Function  Tests: Recent Labs  Lab 10/30/23 1142  AST 19  ALT 7  ALKPHOS 52  BILITOT 1.2  PROT 6.0*  ALBUMIN 2.6*   CBG: Recent Labs  Lab 10/30/23 1015 10/30/23 1709  GLUCAP 61* 69*   Urine analysis:    Component Value Date/Time   COLORURINE YELLOW (A) 07/27/2023 1430   APPEARANCEUR CLEAR (A) 07/27/2023 1430   LABSPEC 1.012 07/27/2023 1430   PHURINE 5.0 07/27/2023 1430   GLUCOSEU NEGATIVE 07/27/2023 1430   HGBUR NEGATIVE 07/27/2023 1430   BILIRUBINUR NEGATIVE 07/27/2023 1430   KETONESUR NEGATIVE 07/27/2023 1430   PROTEINUR NEGATIVE 07/27/2023 1430   NITRITE NEGATIVE 07/27/2023 1430   LEUKOCYTESUR NEGATIVE 07/27/2023 1430   CRITICAL CARE Performed by: Dr. Sherre  Total critical care time: 32 minutes  Critical care time was exclusive of separately billable procedures and treating other patients.  Critical care was necessary to treat  or prevent imminent or life-threatening deterioration.  Critical care was time spent personally by me on the following activities: development of treatment plan with patient's son and DIL, as well as nursing, discussions with consultants, evaluation of patient's response to treatment, examination of patient, obtaining history from patient or surrogate, ordering and performing treatments and interventions, ordering and review of laboratory studies, ordering and review of radiographic studies, pulse oximetry and re-evaluation of patient's condition.  This document was prepared using Dragon Voice Recognition software and may include unintentional dictation errors.  Dr. Sherre [facility]  If 7PM-7AM, please contact overnight-coverage provider If 7AM-7PM, please contact day attending provider [email or web address]  10/30/2023, 5:27 PM

## 2023-10-30 NOTE — ED Notes (Signed)
 Pt drinking coffee with sugar/cream

## 2023-10-30 NOTE — ED Notes (Signed)
 Dr Cox notified of CBG 69. IVF D5LR rate increased from 40ml/hr to 75ml/hr. This nurse advised receiving nurse to recheck CBG in 30 minutes, as her inpatient bed is ready. No mental status changes with patient.

## 2023-10-30 NOTE — Assessment & Plan Note (Signed)
 Stage IIIa, status post XRT along with chemotherapy and her last dose of mitomycin  was on 1/27 05/2023.  She completed Xeloda  and XRT on 03/25/2023.

## 2023-10-30 NOTE — Assessment & Plan Note (Addendum)
 Home scheduled antihypertensive medication not resumed on admission as patient is refusing p.o. medications: Lisinopril -hydrochlorothiazide  20-25 mg daily, amlodipine 10 mg daily, Coreg  3.125 mg p.o. twice daily A.m. team to resume when benefits outweigh the risk Hydralazine  5 mg IV every 6 hours as needed for SBP greater 170, 5 days ordered

## 2023-10-30 NOTE — ED Triage Notes (Signed)
 Pt to ED with son for lack of appetite since 1 week. Pt denies pain. Pt in NAD, respirations unlabored. Skin dry.

## 2023-10-30 NOTE — Consult Note (Signed)
 Consultation Note Date: 10/30/2023   Patient Name: April [name]  DOB: April 16, 1940  MRN: [id number]  Age / Sex: 83 y.o., female  PCP: Salli Amato, MD Referring Physician: Sherre Greig SAILOR, DO  Reason for Consultation: Establishing goals of care   HPI/Brief Hospital Course: 83 y.o. female  with past medical history of stage III SCC of the anus, stage III SCC of right tonsil, thyroid  nodule, CKD stage 3, anemia of chronic disease, T2DM, HTN and HLD admitted from home on 10/30/2023 with worsening poor appetite and FTT.  SCC of the anus-completed concurrent XRt and chemotherapy treatment, last treatment 04/07/2023 SCC of right tonsil-completed chemo and XRT treatments, last treatment 08/04/2023 Thyroid  nodule--US  and biopsy have been deferred at this time  Palliative medicine was consulted for assisting with goals of care conversations.  Subjective:  Extensive chart review has been completed prior to meeting patient including labs, vital signs, imaging, progress notes, orders, and available advanced directive documents from current and previous encounters.  Visited with Ms. Scahill at her bedside. She is resting in bed with eyes closed, does not acknowledge calling of her name or to gentle touch. Son and daughter in law at bedside.  Introduced myself as a Publishing rights manager as a member of the palliative care team. Explained palliative medicine is specialized medical care for people living with serious illness. It focuses on providing relief from the symptoms and stress of a serious illness. The goal is to improve quality of life for both the patient and the family.   Son acknowledges Ms. Blume being followed by Amada Mower, NP as outpatient, last office visit 09/22/2023--pain management was addressed, MOST form provided for family to review.  Family provides a brief life review. Son shares after his father passed in 2013 Ms. Tabak began living with him and his  family. Antonio (son) shares he is the only living child of Ms. Runyon.  Antonio shares Ms. Barcelo has declined significantly over the last several months. He shares she sleeps the majority of the day. He and his wife provide assistance with all ADL's. Ms. Smouse is primarily bed bound, family does get her up to sitting in her recliner most of the day but requires complete assistance and then they return her to bed for the evening. Family shares is has been months since Ms. Atwood was able to participate in ADL's. They share it has also been quite some time since she was able to wake up and have a meaningful conversation with them. Antonio shares over the last several weeks to month Ms. Bonenfant has only consumed a few bites and sips throughout the day, has been quite some time since she was able to tolerate or consume an entire meal.  We discussed patient's current illness and what it means in the larger context of patient's on-going co-morbidities. Natural disease trajectory and expectations at EOL were discussed. Discussed Ms. Tatum may be approaching end of life.  Antonio shares ED physician had a conversation regarding feeding tubes and explained risks as well as purpose of artificial nutrition. Antonio shares he does not feel Ms. Grussing would tolerate or be accepting of artificial nutrition at this time.  Family inquires about the difference between Palliative Care and Hospice Care. Overview of Palliative medicine discussed as well as brief overview of Hospice philosophy. Discussed services provided by hospice and the difference between home with hospice, LTC with hospice and hospice IPU.  Assessed symptoms, Ms. Efaw not able to participate in ROS. Appears comfortable without  obvious signs of discomfort or distress.  Family shares they notice Ms. Jolliff moaning throughout the day, attempt to administer pain medication but she refuses. Family shares when attempting to move Ms. Dieu she moans out  in pain.  I discussed importance of continued conversations with family/support persons and all members of their medical team regarding overall plan of care and treatment options ensuring decisions are in alignment with patients goals of care.  All questions/concerns addressed. Emotional support provided to patient/family/support persons. PMT will continue to follow and support patient as needed.  Objective: Primary Diagnoses: Present on Admission:  Failure to thrive in adult  Essential hypertension  Anal squamous cell carcinoma (HCC)  Right tonsillar squamous cell carcinoma (HCC)  Stage 3b chronic kidney disease (HCC)  Hyperlipidemia due to type 2 diabetes mellitus (HCC)   Physical Exam Constitutional:      General: She is not in acute distress.    Appearance: She is cachectic. She is ill-appearing.  HENT:     Head: Normocephalic.  Pulmonary:     Effort: Pulmonary effort is normal. No respiratory distress.  Skin:    General: Skin is warm and dry.  Neurological:     Mental Status: She is lethargic.     Vital Signs: BP (!) 113/59   Pulse 62   Temp (!) 97.4 F (36.3 C) (Oral)   Resp 14   Ht 5' 5 (1.651 m)   Wt 47.6 kg   SpO2 100%   BMI 17.47 kg/m  Pain Scale: 0-10   Pain Score: 0-No pain   IO: Intake/output summary: No intake or output data in the 24 hours ending 10/30/23 1723  LBM:   Baseline Weight: Weight: 47.6 kg Most recent weight: Weight: 47.6 kg       Palliative Assessment/Data:20%   Assessment and Plan  SUMMARY OF RECOMMENDATIONS   Allow time for family discussions, time for outcomes Continue with current plan of care PMT will follow-up 9/22   Palliative Prophylaxis:   Bowel Regimen, Delirium Protocol and Frequent Pain Assessment  Discussed With: Nursing staff   Thank you for this consult and allowing Palliative Medicine to participate in the care of Kavitha L. Blumstein. Palliative medicine will continue to follow and assist as needed.     Visit includes: Detailed review of medical records (labs, imaging, vital signs), medically appropriate exam (mental status, respiratory, cardiac, skin), discussed with treatment team, counseling and educating patient, family and staff, documenting clinical information, medication management and coordination of care.   Signed by: Waddell Lesches, DNP, AGNP-C Palliative Medicine    Please contact Palliative Medicine Team phone at [phone] for questions and concerns.  For individual provider: See Tracey

## 2023-10-30 NOTE — Assessment & Plan Note (Signed)
 At baseline

## 2023-10-30 NOTE — ED Provider Notes (Signed)
 [facility] Provider Note    Event Date/Time   First MD Initiated Contact with Patient 10/30/23 1021     (approximate)   History   poor appetite   HPI  April [name] is a 83 y.o. 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  History is largely obtained by the patient's son who is at bedside.  For the past week patient has not taken any food and has become increasingly weak.  When asked about chest pain or abdominal pain patient has stated that she had not and family but they have observed her intermittently holding her abdomen.  They note that she recently had a PET scan completed which was positive for continued signal in the tonsil.  They cannot recall when the patient's last bowel movement was.  Patient opens eyes but states leave me alone, when I attempt to get any further history.  She quickly closes her eyes and curls up into a ball afterwards.   Family reports that they would want all imaging and testing and are okay with admission today.  Patient has previously been seen by hospice/palliative care for pain control last on 09/22/2023      Physical Exam   Triage Vital Signs: ED Triage Vitals  Encounter Vitals Group     BP 10/30/23 1013 131/64     Girls Systolic BP Percentile --      Girls Diastolic BP Percentile --      Boys Systolic BP Percentile --      Boys Diastolic BP Percentile --      Pulse Rate 10/30/23 1013 70     Resp 10/30/23 1013 20     Temp 10/30/23 1013 (!) 97.4 F (36.3 C)     Temp Source 10/30/23 1013 Oral     SpO2 10/30/23 1013 100 %     Weight 10/30/23 1011 105 lb (47.6 kg)     Height 10/30/23 1011 5' 5 (1.651 m)     Head Circumference --      Peak Flow --      Pain Score 10/30/23 1010 0     Pain Loc  --      Pain Education --      Exclude from Growth Chart --     Most recent vital signs: Vitals:   10/30/23 1455 10/30/23 1530  BP: (!) 155/84 (!) 156/70  Pulse: 79 77  Resp: 18 14  Temp:    SpO2: 99% 100%    Nursing Triage Note reviewed. Vital signs reviewed and patients oxygen  saturation is normoxic  General: Patient is very thin, appears chronically ill Head: Normocephalic and atraumatic Eyes: Normal inspection, extraocular muscles intact, no conjunctival pallor Ear, nose, throat: Normal external exam Declines to open her mouth for me Neck: Normal range of motion Respiratory: Patient is in no respiratory distress, lungs CTAB Cardiovascular: Patient is not tachycardic, RR GI: Abd SNT with no guarding or rebound  Back: Normal inspection of the back with good strength and range of motion throughout all ext Extremities: pulses intact with good cap refills, no LE pitting edema or calf tenderness Neuro: The patient is alert, unclear orientation and is not answering questions but per family she was previously alert and oriented x 3, moving all extremities Skin: Warm, dry, and intact  ED Results / Procedures / Treatments   Labs (all labs ordered are listed, but only abnormal results are displayed) Labs Reviewed  COMPREHENSIVE METABOLIC PANEL WITH GFR - Abnormal; Notable for the following components:      Result Value   Creatinine, Ser 1.19 (*)    Calcium 8.8 (*)    Total Protein 6.0 (*)    Albumin 2.6 (*)    GFR, Estimated 45 (*)    All other components within normal limits  CBC - Abnormal; Notable for the following components:   WBC 3.1 (*)    RBC 2.98 (*)    Hemoglobin 9.0 (*)    HCT 30.6 (*)    MCV 102.7 (*)    MCHC 29.4 (*)    RDW 16.2 (*)    Platelets 112 (*)    All other components within normal limits  CBG MONITORING, ED - Abnormal; Notable for the following components:   Glucose-Capillary 61 (*)    All other components within normal limits  LACTIC ACID,  PLASMA  URINALYSIS, ROUTINE W REFLEX MICROSCOPIC     EKG EKG and rhythm strip are interpreted by myself:   EKG: [Normal sinus rhythm] at heart rate of 84, normal QRS duration, QTc 468, nonspecific ST segments and T waves PVCs EKG not consistent with Acute STEMI Rhythm strip: NSR in lead II with arrhythmia   RADIOLOGY KUB: Concerning for possible small bowel obstructions Chest x-ray: No acute abnormality on my independent review interpretation CT abdomen pelvis with IV contrast:     PROCEDURES:  Critical Care performed: No  Procedures   MEDICATIONS ORDERED IN ED: Medications  dextrose  5 % and 0.45 % NaCl with KCl 20 mEq/L infusion (has no administration in time range)  LORazepam  (ATIVAN ) injection 1 mg (1 mg Intramuscular Given 10/30/23 1055)  iohexol  (OMNIPAQUE ) 300 MG/ML solution 75 mL (75 mLs Intravenous Contrast Given 10/30/23 1421)     IMPRESSION / MDM / ASSESSMENT AND PLAN / ED COURSE                                Differential diagnosis includes, but is not limited to, hyperglycemia, acute renal insufficiency, UTI, reoccurrence of malignancy, anemia, pneumonia  ED course: Patient appears chronically ill, hypovolemic and she was found to be hypoglycemic.  Patient unfortunately is not interactive during physical exam so broad workup undertaken.  I have ordered her dextrose  containing fluids and she was able to sip a few gulps of orange juice per family.  KUB was concerning for possible small bowel obstruction but she does not have a leukocytosis.  Will obtain lactate and CT.  Anticipate patient will require admission today   Clinical Course as of 10/30/23 1555  Sun Oct 30, 2023  1156 WBC(!): 3.1 Leukopenic but no profound anemia [HD]  1203 DG Abdomen 1 View Concern for small bowel obstruction.  Will obtain a lactate and get a CT abdomen pelvis [HD]  1206 Patients family updated on course of care and need for CT imaging [HD]  1250 Glucose: 85 Better on repeat  [HD]  1524 CT ABDOMEN PELVIS W CONTRAST No evidence of small bowel obstruction.  Will discuss admission with the hospitalist [HD]  1555 Case discussed with hospitalist for admission [HD]    Clinical Course User Index [HD] Nicholaus Rolland BRAVO, MD   -- Risk: 5 This patient has a high risk of morbidity due to further diagnostic testing or treatment. Rationale:  This patient's evaluation and management involve a high risk of morbidity due to the potential severity of presenting symptoms, need for diagnostic testing, and/or initiation of treatment that may require close monitoring. The differential includes conditions with potential for significant deterioration or requiring escalation of care. Treatment decisions in the ED, including medication administration, procedural interventions, or disposition planning, reflect this level of risk. COPA: 5 The patient has the following acute or chronic illness/injury that poses a possible threat to life or bodily function: [X] : The patient has a potentially serious acute condition or an acute exacerbation of a chronic illness requiring urgent evaluation and management in the Emergency Department. The clinical presentation necessitates immediate consideration of life-threatening or function-threatening diagnoses, even if they are ultimately ruled out.   FINAL CLINICAL IMPRESSION(S) / ED DIAGNOSES   Final diagnoses:  Anorexia  Hypoglycemia  Failure to thrive in adult  Malignancy [facility])     Rx / DC Orders   ED Discharge Orders     None        Note:  This document was prepared using Dragon voice recognition software and may include unintentional dictation errors.   Nicholaus Rolland BRAVO, MD 10/30/23 [phone]

## 2023-10-30 NOTE — ED Notes (Signed)
 CBG 61, provided orange juice. Called lab for lab stick.

## 2023-10-30 NOTE — Assessment & Plan Note (Signed)
 At baseline, no indication of acute bleeding at this time Recheck CBC in a.m.

## 2023-10-30 NOTE — Progress Notes (Signed)
 PHARMACIST - PHYSICIAN COMMUNICATION  CONCERNING:  Enoxaparin  (Lovenox ) for DVT Prophylaxis    RECOMMENDATION: Patient was prescribed enoxaprin 40mg  q24 hours for VTE prophylaxis.   Filed Weights   10/30/23 1011  Weight: 47.6 kg (105 lb)    Body mass index is 17.47 kg/m.  Estimated Creatinine Clearance: 26.4 mL/min (A) (by C-G formula based on SCr of 1.19 mg/dL (H)).   Patient is candidate for enoxaparin  30mg  every 24 hours based on CrCl <72ml/min or Weight <45kg  DESCRIPTION: Pharmacy has adjusted enoxaparin  dose per [facility] policy.  Patient is now receiving enoxaparin  30 mg every 24 hours    Lum VEAR Mania, PharmD Clinical Pharmacist  10/30/2023 4:23 PM

## 2023-10-30 NOTE — Assessment & Plan Note (Addendum)
 Likely multifactorial in setting of 2 squamous cell carcinoma (anus and right tonsil) with significant weight loss Family has requested palliative care to be consulted as they would like to understand the difference between palliative, hospice, end-of-life care I discussed that we will do D5 IV fluid infusion to treat her hypoglycemia however given patient's refusal to eat or inability to tolerate p.o. intake, the next step would be to initiate tube feeds which does not increase lifespan or quality of life.  Family does not know what to do at this time.  They will make a decision after discussion with palliative care Strict I's and O's

## 2023-10-30 NOTE — ED Notes (Signed)
 Advised nurse that patient has ready bed

## 2023-10-30 NOTE — Hospital Course (Signed)
 Ms. 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  Vitals in the ED showed temperature of 97.4, respiration rate 20, heart rate 70, blood pressure 131/64, SpO2 100% on room air.  Serum sodium is 143, potassium 3.5, chloride 107, bicarb 26, BUN of 15, serum creatinine 1.19, eGFR 45, nonfasting blood glucose 85, WBC 3.1, hemoglobin 9.0, platelets of 112.  Initial CBG showed 61.  Per nursing documentation, patient received orange juice.  Lactic acid of 0.7.  Per nursing documentation, patient was drinking coffee with sugar and cream.  ED treatment: Ativan  1 mg IV one-time dose.

## 2023-10-31 DIAGNOSIS — E041 Nontoxic single thyroid nodule: Secondary | ICD-10-CM | POA: Diagnosis not present

## 2023-10-31 DIAGNOSIS — E43 Unspecified severe protein-calorie malnutrition: Secondary | ICD-10-CM | POA: Diagnosis not present

## 2023-10-31 DIAGNOSIS — Z515 Encounter for palliative care: Secondary | ICD-10-CM | POA: Diagnosis not present

## 2023-10-31 DIAGNOSIS — R627 Adult failure to thrive: Secondary | ICD-10-CM | POA: Diagnosis not present

## 2023-10-31 DIAGNOSIS — C210 Malignant neoplasm of anus, unspecified: Secondary | ICD-10-CM | POA: Diagnosis not present

## 2023-10-31 DIAGNOSIS — C099 Malignant neoplasm of tonsil, unspecified: Secondary | ICD-10-CM | POA: Diagnosis not present

## 2023-10-31 LAB — BASIC METABOLIC PANEL WITH GFR
Anion gap: 7 (ref 5–15)
BUN: 13 mg/dL (ref 8–23)
CO2: 28 mmol/L (ref 22–32)
Calcium: 8.3 mg/dL — ABNORMAL LOW (ref 8.9–10.3)
Chloride: 108 mmol/L (ref 98–111)
Creatinine, Ser: 0.95 mg/dL (ref 0.44–1.00)
GFR, Estimated: 59 mL/min — ABNORMAL LOW (ref >60)
Glucose, Bld: 107 mg/dL — ABNORMAL HIGH (ref 70–99)
Potassium: 2.7 mmol/L — CL (ref 3.5–5.1)
Sodium: 143 mmol/L (ref 135–145)

## 2023-10-31 LAB — CBC
HCT: 22.9 % — ABNORMAL LOW (ref 36.0–46.0)
Hemoglobin: 7.2 g/dL — ABNORMAL LOW (ref 12.0–15.0)
MCH: 29.6 pg (ref 26.0–34.0)
MCHC: 31.4 g/dL (ref 30.0–36.0)
MCV: 94.2 fL (ref 80.0–100.0)
Platelets: 112 K/uL — ABNORMAL LOW (ref 150–400)
RBC: 2.43 MIL/uL — ABNORMAL LOW (ref 3.87–5.11)
RDW: 16.1 % — ABNORMAL HIGH (ref 11.5–15.5)
WBC: 2.9 K/uL — ABNORMAL LOW (ref 4.0–10.5)
nRBC: 0 % (ref 0.0–0.2)

## 2023-10-31 LAB — GLUCOSE, CAPILLARY
Glucose-Capillary: 105 mg/dL — ABNORMAL HIGH (ref 70–99)
Glucose-Capillary: 105 mg/dL — ABNORMAL HIGH (ref 70–99)
Glucose-Capillary: 109 mg/dL — ABNORMAL HIGH (ref 70–99)
Glucose-Capillary: 97 mg/dL (ref 70–99)

## 2023-10-31 MED ORDER — POTASSIUM CHLORIDE 2 MEQ/ML IV SOLN: INTRAVENOUS | Status: DC

## 2023-10-31 MED ORDER — POTASSIUM CHLORIDE 2 MEQ/ML IV SOLN
INTRAVENOUS | Status: DC
  Filled 2023-10-31 (×5): qty 1000

## 2023-10-31 MED ORDER — MORPHINE SULFATE (PF) 2 MG/ML IV SOLN
2.0000 mg | INTRAVENOUS | Status: DC | PRN
  Administered 2023-10-31: 2 mg via INTRAVENOUS
  Filled 2023-10-31: qty 1

## 2023-10-31 NOTE — Progress Notes (Addendum)
 Initial Nutrition Assessment  DOCUMENTATION CODES:   Underweight, Severe malnutrition in context of chronic illness  INTERVENTION:   -Continue full liquid diet -Continue Ensure Plus High Protein po BID, each supplement provides 350 kcal and 20 grams of protein  -Further recommendations discussed with team (RN, MD, and palliative care)   NUTRITION DIAGNOSIS:   Severe Malnutrition related to chronic illness (stage 3 squamous cell carcinoma of anus) as evidenced by moderate muscle depletion, severe muscle depletion, percent weight loss, severe fat depletion.  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  Pt admitted with failure to thrive in adult and hypoglycemia.   Reviewed I/O's: +968 ml x 24 hours  UOP: 0 ml x 24 hours  Pt currently on a full liquid diet. Noted meal tray on tray table; tray untouched.   Pt lying in bed at time of visit. No family at bedside to provide additional history. Pt did not arouse to voice or touch at any time during assessment. Noted nursing unable to give medications secondary to lethargy.   Reviewed wt hx; pt has experienced a 9.5% wt loss over the past month, which is significant for time frame.   Case discussed with RN, MD, and palliative care. Per palliative care, pt family is reasonable, but needs more time to make firm decisions. If full scope care is desired, pt would require artifical means of  nutrition/ hydration. MD reports pt is a poor PEG candidate.   Medications reviewed and include remeron  and dextrose  5% lactated ringers  with potassium chloride .   Labs reviewed: K: 2.7 (on IV supplementation), CBGS: 69-105 (inpatient orders for glycemic control are 0-9 units insulin  aspart TID with meals).    NUTRITION - FOCUSED PHYSICAL EXAM:  Flowsheet Row Most Recent Value  Orbital Region Mild depletion  Upper Arm Region Severe depletion  Thoracic and Lumbar Region Severe depletion  Buccal Region Moderate depletion  Temple Region Severe depletion  Clavicle Bone Region Severe depletion  Clavicle and Acromion Bone Region Severe depletion  Scapular Bone Region Severe depletion  Dorsal Hand Severe depletion  Patellar Region Severe depletion  Anterior Thigh Region Severe depletion  Posterior Calf Region Severe depletion  Edema (RD Assessment) None  Hair Reviewed  Eyes Reviewed  Mouth Reviewed  Skin Reviewed  Nails Reviewed    Diet Order:   Diet Order             Diet full liquid Room service appropriate? Yes; Fluid consistency: Thin  Diet effective now                   EDUCATION NEEDS:   Not appropriate for education at this time  Skin:  Skin Assessment: Skin Integrity Issues: Skin Integrity Issues:: Stage II Stage II: sacrum, lt ischial tuberosity  Last BM:  Unknown  Height:   Ht Readings from Last 1 Encounters:  10/30/23 5' 5 (1.651 m)    Weight:   Wt Readings from Last 1 Encounters:  10/30/23 47.6 kg  Ideal Body Weight:  56.8 kg  BMI:  Body mass index is 17.47 kg/m.  Estimated Nutritional Needs:   Kcal:  1450-1650  Protein:  70-85 grams  Fluid:  1.4-1.6 L    Margery ORN, RD, LDN, CDCES Registered Dietitian III Certified Diabetes Care and Education Specialist If unable to reach this RD, please use RD Inpatient group chat on secure chat between hours of 8am-4 pm daily

## 2023-10-31 NOTE — Progress Notes (Signed)
 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   At bedside patient is nonverbal and refuses to open her eyes.   She mumbles incoherently when I call out to her.  She would not open her mouth.   Assessment & Plan:   Principal Problem:   Failure to thrive in adult Active Problems:   Anal squamous cell carcinoma (HCC)   Right tonsillar squamous cell carcinoma (HCC)   Hypoglycemia   Thyroid  nodule   Type 2 diabetes mellitus without complications (HCC)   Essential hypertension   Hyperlipidemia due to type 2 diabetes mellitus (HCC)   Stage 3b chronic kidney disease (HCC)   Anemia of chronic disease  Failure to thrive in adult Likely multifactorial in setting of 2 squamous cell carcinoma (anus and right tonsil) with significant weight loss Palliative care consulted Continue dextrose  containing fluids for now Pending further conversations between family and palliative care Patient is hospice appropriate Discussed with oncology as well    Hypoglycemia Dextrose  containing fluids initiated   Right tonsillar squamous cell carcinoma (HCC) Not a candidate for further chemotherapy   Anal squamous cell carcinoma (HCC) Further chemotherapy not an option given functional status currently   Thyroid  nodule No acute issues   Anemia of chronic disease No transfusions indicated   Stage 3b chronic kidney disease (HCC) At baseline   Hyperlipidemia due to type 2 diabetes mellitus (HCC) Hold statin    Essential hypertension On and IV hydralazine  as needed.  Hold oral agents   Type 2 diabetes mellitus without complications (HCC) Hold oral agents SSI    DVT prophylaxis: Lovenox  Code Status: DNR/DNI Family Communication: None today Disposition Plan: Status is: Inpatient Remains inpatient appropriate because: Adult failure to thrive   Level of care: Telemetry Medical  Consultants:  Palliative care  Procedures:  None  Antimicrobials: None   Subjective: Seen and examined.  Verbally unresponsive.  Appears weak and fatigued.  Objective: Vitals:   10/31/23 0043 10/31/23 0441 10/31/23 0936 10/31/23 1203  BP: (!) 159/68 (!) 160/80 (!) 163/64 (!) 161/68  Pulse: 78 72 65 (!) 53  Resp: 18  16 18   Temp: 98.4 F (36.9 C) 98.6 F (37 C) 97.7 F (36.5 C) 98.3 F (36.8 C)  TempSrc: Oral     SpO2: 100% 99% 100% 95%  Weight:      Height:        Intake/Output Summary (Last 24 hours) at 10/31/2023 1356 Last data filed at 10/31/2023 0900 Gross per 24 hour  Intake 968.42 ml  Output 0 ml  Net 968.42 ml   Filed Weights   10/30/23 1011  Weight: 47.6 kg    Examination:  General exam: Appears weak and fatigued Respiratory system: Respiratory effort.  Bibasilar crackles.  Normal work of breathing.  Room air. Cardiovascular system: S2, tachycardic, regular rhythm, no murmurs Gastrointestinal system:, Soft, NT/ND, normal bowel sounds Central nervous system: Lethargic.  Unable to assess orientation Extremities: Marked decreased power bilaterally Skin: No rashes, lesions or ulcers Psychiatry: Unable to assess    Data Reviewed: I have personally  reviewed following labs and imaging studies  CBC: Recent Labs  Lab 10/30/23 1142 10/31/23 0905  WBC 3.1* 2.9*  HGB 9.0* 7.2*  HCT 30.6* 22.9*  MCV 102.7* 94.2  PLT 112* 112*   Basic Metabolic Panel: Recent Labs  Lab 10/30/23 1142 10/31/23 0905  NA 143 143  K 3.5 2.7*  CL 107 108  CO2 26 28  GLUCOSE 85 107*  BUN  15 13  CREATININE 1.19* 0.95  CALCIUM 8.8* 8.3*   GFR: Estimated Creatinine Clearance: 33.1 mL/min (by C-G formula based on SCr of 0.95 mg/dL). Liver Function Tests: Recent Labs  Lab 10/30/23 1142  AST 19  ALT 7  ALKPHOS 52  BILITOT 1.2  PROT 6.0*  ALBUMIN 2.6*   No results for input(s): LIPASE, AMYLASE in the last 168 hours. No results for input(s): AMMONIA in the last 168 hours. Coagulation Profile: No results for input(s): INR, PROTIME in the last 168 hours. Cardiac Enzymes: No results for input(s): CKTOTAL, CKMB, CKMBINDEX, TROPONINI in the last 168 hours. BNP (last 3 results) No results for input(s): PROBNP in the last 8760 hours. HbA1C: No results for input(s): HGBA1C in the last 72 hours. CBG: Recent Labs  Lab 10/30/23 1805 10/30/23 2034 10/31/23 0038 10/31/23 0356 10/31/23 1204  GLUCAP 80 101* 105* 105* 97   Lipid Profile: No results for input(s): CHOL, HDL, LDLCALC, TRIG, CHOLHDL, LDLDIRECT in the last 72 hours. Thyroid  Function Tests: No results for input(s): TSH, T4TOTAL, FREET4, T3FREE, THYROIDAB in the last 72 hours. Anemia Panel: No results for input(s): VITAMINB12, FOLATE, FERRITIN, TIBC, IRON, RETICCTPCT in the last 72 hours. Sepsis Labs: Recent Labs  Lab 10/30/23 1453  LATICACIDVEN 0.7    No results found for this or any previous visit (from the past 240 hours).       Radiology Studies: CT ABDOMEN PELVIS W CONTRAST Result Date: 10/30/2023 CLINICAL DATA:  Concern for small bowel obstruction. EXAM: CT ABDOMEN AND PELVIS WITH CONTRAST TECHNIQUE: Multidetector CT imaging of the abdomen and pelvis was performed using the standard protocol following bolus administration of intravenous contrast. RADIATION DOSE REDUCTION: This exam was performed according to the departmental dose-optimization program which includes automated exposure control, adjustment of the mA and/or kV according to patient  size and/or use of iterative reconstruction technique. CONTRAST:  75mL OMNIPAQUE  IOHEXOL  300 MG/ML  SOLN COMPARISON:  10/30/2023, 10/17/2023. FINDINGS: Lower chest: The heart is enlarged and coronary artery calcification is noted. There is calcification of mitral valve annulus. Atelectasis is present at the lung bases bilaterally. Hepatobiliary: No focal liver abnormality is seen. Status post cholecystectomy. No biliary dilatation. Pancreas: Unremarkable. No pancreatic ductal dilatation or surrounding inflammatory changes. Spleen: Normal size. A few scattered hypodensities are noted, statistically most likely representing cysts or hemangiomas. Adrenals/Urinary Tract: The adrenal glands are within normal limits. The kidneys enhance symmetrically. Cysts are noted in the kidneys bilaterally. Additional subcentimeter hypodensities are present in the kidneys which are too small to further characterize. No renal calculus or hydronephrosis bilaterally. The bladder is unremarkable. Stomach/Bowel: The stomach is decompressed and otherwise within normal limits. No bowel obstruction, free air, or pneumatosis is seen. Scattered diverticula are present along the colon without evidence of diverticulitis. Appendix is not seen. Pelvic floor laxity is noted. There is thickening of the walls of the rectum and anal region with perirectal fat stranding. Vascular/Lymphatic: Aortic atherosclerosis. No enlarged abdominal or pelvic lymph nodes. Reproductive: Uterus and bilateral adnexa are unremarkable. Other: No abdominopelvic ascites.  Mild anasarca is present. Musculoskeletal: Degenerative  changes are noted in the thoracolumbar spine. No acute osseous abnormality is seen. IMPRESSION: 1. No evidence of small-bowel obstruction. 2. Thickening of the walls of the rectum and anorectal junction which may be associated with known mass. Superimposed proctitis cannot be excluded. 3. Mild anasarca. 4. Diverticulosis without diverticulitis. 5.  Aortic atherosclerosis. Electronically Signed   By: Leita Birmingham M.D.   On: 10/30/2023 15:13   DG Abdomen 1 View Result Date: 10/30/2023 CLINICAL DATA:  evaluate for pna Evaluate for pna per ordering provider. Patients family member states patient lack of appetite. EXAM: PORTABLE CHEST and abdomen 1 VIEW COMPARISON:  Chest x-ray 02/18/2023, x-ray abdomen 10/30/2023, PET CT 04/05/2023 FINDINGS: Right chest wall Port-A-Cath with tip overlying the superior cavoatrial junction. The heart and mediastinal contours are unchanged. Atherosclerotic plaque. Severe mitral annular calcification. No focal consolidation. Chronic coarsened interstitial markings with no overt pulmonary edema. No pleural effusion. No pneumothorax. No acute osseous abnormality. Multiple loops of small bowel dilated with gas. Right upper quadrant surgical clips. Surgical changes overlie the right pelvis. IMPRESSION: 1. No acute cardiopulmonary abnormality. 2. Gaseous dilatation of multiple loops of small bowel. Finding could represent small bowel obstructions. 3. Aortic Atherosclerosis (ICD10-I70.0) including mitral annular calcification. Electronically Signed   By: Morgane  Naveau M.D.   On: 10/30/2023 11:03   DG Chest Portable 1 View Result Date: 10/30/2023 CLINICAL DATA:  evaluate for pna Evaluate for pna per ordering provider. Patients family member states patient lack of appetite. EXAM: PORTABLE CHEST and abdomen 1 VIEW COMPARISON:  Chest x-ray 02/18/2023, x-ray abdomen 10/30/2023, PET CT 04/05/2023 FINDINGS: Right chest wall Port-A-Cath with tip overlying the superior cavoatrial junction. The heart and mediastinal contours are unchanged. Atherosclerotic plaque. Severe mitral annular calcification. No focal consolidation. Chronic coarsened interstitial markings with no overt pulmonary edema. No pleural effusion. No pneumothorax. No acute osseous abnormality. Multiple loops of small bowel dilated with gas. Right upper quadrant surgical  clips. Surgical changes overlie the right pelvis. IMPRESSION: 1. No acute cardiopulmonary abnormality. 2. Gaseous dilatation of multiple loops of small bowel. Finding could represent small bowel obstructions. 3. Aortic Atherosclerosis (ICD10-I70.0) including mitral annular calcification. Electronically Signed   By: Morgane  Naveau M.D.   On: 10/30/2023 11:03        Scheduled Meds:  Chlorhexidine  Gluconate Cloth  6 each Topical Daily   enoxaparin  (LOVENOX ) injection  30 mg Subcutaneous QHS   feeding supplement  237 mL Oral BID BM   insulin  aspart  0-5 Units Subcutaneous QHS   insulin  aspart  0-9 Units Subcutaneous TID WC   mirtazapine   7.5 mg Oral QHS   sodium chloride  flush  10-40 mL Intracatheter Q12H   Continuous Infusions:  dextrose  5% lactated ringers  1,000 mL with potassium chloride  40 mEq infusion 75 mL/hr at 10/31/23 1035     LOS: 1 day      Calvin KATHEE Robson, MD [facility]   If 7PM-7AM, please contact night-coverage  10/31/2023, 1:56 PM

## 2023-10-31 NOTE — Plan of Care (Signed)

## 2023-10-31 NOTE — Progress Notes (Signed)
 Daily Progress Note   Patient Name: April [name]       Date: 10/31/2023 DOB: Jul 26, 1940  Age: 83 y.o. MRN#: [id number] Attending Physician: Jhonny Calvin NOVAK, MD Primary Care Physician: Salli Amato, MD Admit Date: 10/30/2023  Reason for Consultation/Follow-up: Establishing goals of care  HPI/Brief Hospital Review:  83 y.o. female  with past medical history of stage III SCC of the anus, stage III SCC of right tonsil, thyroid  nodule, CKD stage 3, anemia of chronic disease, T2DM, HTN and HLD admitted from home on 10/30/2023 with worsening poor appetite and FTT.   SCC of the anus-completed concurrent XRt and chemotherapy treatment, last treatment 04/07/2023 SCC of right tonsil-completed chemo and XRT treatments, last treatment 08/04/2023 Thyroid  nodule--US  and biopsy have been deferred at this time   Palliative medicine was consulted for assisting with goals of care conversations.  Subjective: Extensive chart review has been completed prior to meeting patient including labs, vital signs, imaging, progress notes, orders, and available advanced directive documents from current and previous encounters.    Visited with Ms. Dirusso at her bedside. She is lying in bed with eyes opened, does not attempt to engage in communication, unable to follow commands. Appears comfortable without obvious signs of discomfort or distress. No family or visitors at bedside during time of visit.  Returned to bedside once daughter in law visiting, nieces also at bedside. Provided updates from [place], no significant improvements, medical team concerned for overall poor prognosis, recommendations for consideration for end of life/hospice care. Daughter in law shares her husband would be at bedside this afternoon for  continued conversations.  Returned to bedside, spoke with son and daughter in law outside of room. Review Ms. Wedemeyer has been minimally responsive today, has not consumed any PO food or liquid. Remains on D5 without significant improvement. Discussed medical teams overall concern for Ms. Hoch's poor prognosis and recommendations to consider transition to hospice/end of life care. Son verbalizes understanding and is in agreement. Son wishes for Ms. Braggs to return to his home with hospice services following. Discussed DME equipment, they will be in need of hospital bed prior to discharge.  Discussed discontinuing lab draws as Ms. Boyan required venipuncture this AM for which son agreed.  Answered and addressed all questions and concerns. PMT will step away from daily visits as  goals and plans are clear, encouraged family to reach out to provided PMT contact information if needs or concerns arise.  Objective:  Physical Exam Constitutional:      General: She is not in acute distress.    Appearance: She is cachectic. She is ill-appearing.     Comments: Frail  HENT:     Head: Normocephalic.  Pulmonary:     Effort: Pulmonary effort is normal. No respiratory distress.  Abdominal:     General: There is no distension.     Tenderness: There is no abdominal tenderness.  Skin:    General: Skin is warm and dry.  Neurological:     Mental Status: She is lethargic.             Vital Signs: BP (!) 161/68 (BP Location: Left Arm)   Pulse (!) 53   Temp 98.3 F (36.8 C)   Resp 18   Ht 5' 5 (1.651 m)   Wt 47.6 kg   SpO2 95%   BMI 17.47 kg/m  SpO2: SpO2: 95 % O2 Device: O2 Device: Room Air O2 Flow Rate:     Palliative Care Assessment & Plan   Assessment/Recommendation/Plan  Anticipate d/c home with hospice services 1-2 days  Care plan was discussed with primary team, nursing staff, TOC and hospice liaison.  Thank you for allowing the Palliative Medicine Team to assist in the care of  this patient.  Visit includes: Detailed review of medical records (labs, imaging, vital signs), medically appropriate exam (mental status, respiratory, cardiac, skin), discussed with treatment team, counseling and educating patient, family and staff, documenting clinical information, medication management and coordination of care.  Waddell Lesches, DNP, AGNP-C Palliative Medicine   Please contact Palliative Medicine Team phone at [phone] for questions and concerns.

## 2023-11-01 DIAGNOSIS — R627 Adult failure to thrive: Secondary | ICD-10-CM | POA: Diagnosis not present

## 2023-11-01 MED ORDER — ENOXAPARIN SODIUM 40 MG/0.4ML IJ SOSY
40.0000 mg | PREFILLED_SYRINGE | Freq: Every day | INTRAMUSCULAR | Status: DC
  Administered 2023-11-01: 40 mg via SUBCUTANEOUS
  Filled 2023-11-01: qty 0.4

## 2023-11-01 NOTE — Plan of Care (Signed)
  Problem: Coping: Goal: Ability to adjust to condition or change in health will improve Outcome: Progressing   Problem: Health Behavior/Discharge Planning: Goal: Ability to manage health-related needs will improve Outcome: Progressing   Problem: Nutritional: Goal: Maintenance of adequate nutrition will improve Outcome: Progressing   Problem: Tissue Perfusion: Goal: Adequacy of tissue perfusion will improve Outcome: Progressing   

## 2023-11-01 NOTE — TOC Transition Note (Signed)
 Transition of Care [facility]) - Discharge Note   Patient Details  Name: April [name] MRN: [id number] Date of Birth: [birth year]  Transition of Care [facility]) CM/SW Contact:  Dalia GORMAN Fuse, RN Phone Number: 11/01/2023, 12:17 PM   Clinical Narrative:    Patient is medically clear to discharge to home with hospice. The patient's family selected [facility]. Referral for home hospice called to [facility], Hunter Seip accepted the referral. DME will be delivered to the patient's home. [facility] will transport.   Final next level of care: Home w Hospice Care Barriers to Discharge: Barriers Resolved   Patient Goals and CMS Choice            Discharge Placement                       Discharge Plan and Services Additional resources added to the After Visit Summary for                              [facility] Agency: Other - See comment [facility] [place]) Date [facility] Agency Contacted: 11/01/23 Time HH Agency Contacted: 1217 Representative spoke with at [facility] Agency: Hunter Seip  Social Drivers of Health (SDOH) Interventions SDOH Screenings   Food Insecurity: No Food Insecurity (10/30/2023)  Housing: Low Risk  (10/30/2023)  Transportation Needs: No Transportation Needs (10/30/2023)  Utilities: Not At Risk (10/30/2023)  Alcohol Screen: Low Risk  (07/21/2022)  Depression (PHQ2-9): Low Risk  (09/22/2023)  Recent Concern: Depression (PHQ2-9) - Medium Risk (08/16/2023)  Financial Resource Strain: Low Risk  (07/05/2023)   Received from [facility]  Physical Activity: Insufficiently Active (07/21/2022)  Social Connections: Patient Unable To Answer (10/30/2023)  Stress: No Stress Concern Present (07/21/2022)  Tobacco Use: Medium Risk (10/30/2023)     Readmission Risk Interventions     No data to display

## 2023-11-01 NOTE — Progress Notes (Signed)
 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   At bedside patient is nonverbal and refuses to open her eyes.   She mumbles incoherently when I call out to her.  She would not open her mouth.   Assessment & Plan:   Principal Problem:   Failure to thrive in adult Active Problems:   Anal squamous cell carcinoma (HCC)   Right tonsillar squamous cell carcinoma (HCC)   Hypoglycemia   Thyroid  nodule   Type 2 diabetes mellitus without complications (HCC)   Essential hypertension   Hyperlipidemia due to type 2 diabetes mellitus (HCC)   Stage 3b chronic kidney disease (HCC)   Anemia of chronic disease   Protein-calorie malnutrition, severe  Failure to thrive in adult Likely multifactorial in setting of 2 squamous cell carcinoma (anus and right tonsil) with significant weight loss.  Palliative care consulted.  Decision to discharge patient home in the care of family with hospice services.  Currently pending DME  Hypoglycemia Continue low rate dextrose  containing fluids   Right tonsillar squamous cell carcinoma (HCC) Not a candidate for further chemotherapy   Anal squamous cell carcinoma (HCC) Further chemotherapy not an option given functional status currently   Thyroid  nodule No acute issues   Anemia of chronic disease No transfusions indicated   Stage 3b chronic kidney disease (HCC) At baseline   Hyperlipidemia due to type 2 diabetes mellitus (HCC) Hold statin   Essential hypertension On and  IV hydralazine  as needed.  Hold oral agents   Type 2 diabetes mellitus without complications (HCC) Hold oral agents DC SSI and CBGs    DVT prophylaxis: Love seen nox Code Status: DNR/DNI Family Communication: None today Disposition Plan: Status is: Inpatient Remains inpatient appropriate because: Adult failure to thrive   Level of care: Telemetry Medical  Consultants:  Palliative care  Procedures:  None  Antimicrobials: None   Subjective: Examined.  Verbally unresponsive.  Weak and fatigued.  Objective: Vitals:   10/31/23 1558 10/31/23 2027 11/01/23 0402 11/01/23 0828  BP: (!) 154/87 (!) 165/75 (!) 161/81 (!) 169/68  Pulse: 71 76 76 77  Resp: 16 17 15 19   Temp: 98.1 F (36.7 C) (!) 97.5 F (36.4 C) 98.1 F (36.7 C) 98.1 F (36.7 C)  TempSrc: Oral Oral  Oral  SpO2: 99% 100% 97% 100%  Weight:      Height:       No intake or output data in the 24 hours ending 11/01/23 1315  Filed Weights   10/30/23 1011  Weight: 47.6 kg    Examination:  General exam: Appears weak and fatigued Respiratory system: Diminished respiratory effort.  Bibasilar crackles.  Normal work of breathing.  Room air Cardiovascular system: S2, tachycardic, regular rhythm, no murmurs Gastrointestinal system:, Soft, NT/ND, normal bowel sounds Central nervous system: Lethargic.  Unable to assess orientation Extremities: Marked decreased power bilaterally Skin: No rashes, lesions or ulcers Psychiatry: Unable to assess    Data Reviewed: I have personally reviewed following labs and imaging studies  CBC: Recent Labs  Lab 10/30/23 1142 10/31/23 0905  WBC 3.1* 2.9*  HGB 9.0* 7.2*  HCT 30.6* 22.9*  MCV 102.7* 94.2  PLT 112* 112*   Basic Metabolic Panel: Recent Labs  Lab 10/30/23 1142 10/31/23 0905  NA 143 143  K 3.5 2.7*  CL 107 108  CO2 26 28  GLUCOSE 85 107*  BUN 15 13  CREATININE 1.19* 0.95  CALCIUM 8.8* 8.3*   GFR: Estimated Creatinine Clearance: 33.1 mL/min (by C-G  formula based on SCr of 0.95 mg/dL). Liver Function Tests: Recent Labs  Lab 10/30/23 1142  AST 19  ALT 7  ALKPHOS 52  BILITOT 1.2  PROT 6.0*  ALBUMIN 2.6*   No results for input(s): LIPASE, AMYLASE in the last 168 hours. No results for input(s): AMMONIA in the last 168 hours. Coagulation Profile: No results for input(s): INR, PROTIME in the last 168 hours. Cardiac Enzymes: No results for input(s): CKTOTAL, CKMB, CKMBINDEX, TROPONINI in the last 168 hours. BNP (last 3 results) No results for input(s): PROBNP in the last 8760 hours. HbA1C: No results for input(s): HGBA1C in the last 72 hours. CBG: Recent Labs  Lab 10/30/23 2034 10/31/23 0038 10/31/23 0356 10/31/23 1204 10/31/23 1601  GLUCAP 101* 105* 105* 97 109*   Lipid Profile: No results for input(s): CHOL, HDL, LDLCALC, TRIG, CHOLHDL, LDLDIRECT in the last 72 hours. Thyroid  Function Tests: No results for input(s): TSH, T4TOTAL, FREET4, T3FREE, THYROIDAB in the last 72 hours. Anemia Panel: No results for input(s): VITAMINB12, FOLATE, FERRITIN, TIBC, IRON, RETICCTPCT in the last 72 hours. Sepsis Labs: Recent Labs  Lab 10/30/23 1453  LATICACIDVEN 0.7    No results found for this or any previous visit (from the past 240 hours).       Radiology Studies: CT ABDOMEN PELVIS W CONTRAST Result Date: 10/30/2023 CLINICAL DATA:  Concern for small bowel obstruction. EXAM: CT ABDOMEN AND PELVIS WITH CONTRAST TECHNIQUE: Multidetector CT imaging of the abdomen and pelvis was performed using the standard protocol following bolus administration of intravenous contrast. RADIATION DOSE REDUCTION: This exam was performed according to the departmental dose-optimization program which includes automated exposure control, adjustment of the mA and/or kV according to patient size and/or use of iterative reconstruction technique. CONTRAST:  75mL OMNIPAQUE  IOHEXOL  300 MG/ML  SOLN  COMPARISON:  10/30/2023, 10/17/2023. FINDINGS: Lower chest: The heart is enlarged and coronary artery calcification is noted. There is calcification of mitral valve annulus. Atelectasis is present at the lung bases bilaterally. Hepatobiliary: No focal liver abnormality is seen. Status post cholecystectomy. No biliary dilatation. Pancreas: Unremarkable. No pancreatic ductal dilatation or surrounding inflammatory changes. Spleen: Normal size. A few scattered hypodensities are noted, statistically most likely representing cysts or hemangiomas. Adrenals/Urinary Tract: The adrenal glands are within normal limits. The kidneys enhance symmetrically. Cysts are noted in the kidneys bilaterally. Additional subcentimeter hypodensities are present in the kidneys which are too small to further characterize. No renal calculus or hydronephrosis bilaterally. The bladder is unremarkable. Stomach/Bowel: The stomach is decompressed and otherwise within normal limits. No bowel obstruction, free air, or pneumatosis is seen. Scattered diverticula are present along the colon without evidence of diverticulitis. Appendix is not seen. Pelvic floor laxity is noted. There is thickening of the walls of the rectum and anal region with perirectal fat stranding. Vascular/Lymphatic: Aortic atherosclerosis. No enlarged abdominal or pelvic lymph nodes. Reproductive: Uterus and bilateral adnexa are unremarkable. Other: No abdominopelvic ascites.  Mild anasarca is present. Musculoskeletal: Degenerative changes are noted in the thoracolumbar spine. No acute osseous abnormality is seen. IMPRESSION: 1. No evidence  of small-bowel obstruction. 2. Thickening of the walls of the rectum and anorectal junction which may be associated with known mass. Superimposed proctitis cannot be excluded. 3. Mild anasarca. 4. Diverticulosis without diverticulitis. 5. Aortic atherosclerosis. Electronically Signed   By: Leita Birmingham M.D.   On: 10/30/2023 15:13         Scheduled Meds:  Chlorhexidine  Gluconate Cloth  6 each Topical Daily   enoxaparin  (LOVENOX ) injection  30 mg Subcutaneous QHS   feeding supplement  237 mL Oral BID BM   mirtazapine   7.5 mg Oral QHS   sodium chloride  flush  10-40 mL Intracatheter Q12H   Continuous Infusions:  dextrose  5% lactated ringers  1,000 mL with potassium chloride  40 mEq infusion 75 mL/hr at 11/01/23 0001     LOS: 2 days      Calvin KATHEE Robson, MD [facility]   If 7PM-7AM, please contact night-coverage  11/01/2023, 1:15 PM

## 2023-11-01 NOTE — Plan of Care (Signed)
  Problem: Nutritional: Goal: Maintenance of adequate nutrition will improve Outcome: Progressing   Problem: Skin Integrity: Goal: Risk for impaired skin integrity will decrease Outcome: Progressing   Problem: Education: Goal: Knowledge of General Education information will improve Description: Including pain rating scale, medication(s)/side effects and non-pharmacologic comfort measures Outcome: Progressing   Problem: Nutrition: Goal: Adequate nutrition will be maintained Outcome: Progressing   Problem: Coping: Goal: Level of anxiety will decrease Outcome: Progressing   Problem: Pain Managment: Goal: General experience of comfort will improve and/or be controlled Outcome: Progressing   Problem: Safety: Goal: Ability to remain free from injury will improve Outcome: Progressing

## 2023-11-01 NOTE — Progress Notes (Signed)
 [facility] Liaison RN note  Received request from [facility] for hospice services at son's home after discharge. Chart and patient information under review by Hospice physician.   Spoke with son, Westley to initiate education related to hospice philosophy, services, and team approach to care. Patient/family verbalized understanding of information given.   Per discussion, the plan is for discharge home by EMS once DME is in place.   DME needs discussed. Patient has the following equipment in the home. N/A.   Patient/family requests the following equipment for deliver: hospital bed and overbed table. Address has been verified and is correct in the chart. Antonio is the family contact to arrange time of equipment delivery.   Please send signed and completed DNR with patient/family. Please provide prescriptions at discharge as needed for ongoing symptom management.   Please call with any hospice related questions or concerns.  Thank you for the opportunity to participate in this patient's care.  Hunter Seip, Charity fundraiser, BSN [employer] [phone]

## 2023-11-01 NOTE — Progress Notes (Signed)
 PHARMACIST - PHYSICIAN COMMUNICATION  CONCERNING:  Enoxaparin  (Lovenox ) for DVT Prophylaxis   ASSESSMENT: Patient was prescribed enoxaparin  30 mg subcutaneously every 24 hours for VTE prophylaxis.   Body mass index is 17.47 kg/m.  Estimated Creatinine Clearance: 33.1 mL/min (by C-G formula based on SCr of 0.95 mg/dL).  Based on [facility] policy, patient qualifies for enoxaparin  dosing of 40 mg every 24 hours because their creatinine clearance is >30 mL/min.  PLAN: Pharmacy has adjusted enoxaparin  dose per [facility] policy.  Description: Patient is now receiving enoxaparin  40 mg subcutaneously every 24 hours.  Will M. Lenon, PharmD, BCPS Clinical Pharmacist 11/01/2023 3:26 PM

## 2023-11-02 ENCOUNTER — Inpatient Hospital Stay

## 2023-11-02 ENCOUNTER — Other Ambulatory Visit: Payer: Self-pay

## 2023-11-02 ENCOUNTER — Encounter: Payer: Self-pay | Admitting: Oncology

## 2023-11-02 ENCOUNTER — Inpatient Hospital Stay: Admitting: Hospice and Palliative Medicine

## 2023-11-02 ENCOUNTER — Inpatient Hospital Stay: Admitting: Oncology

## 2023-11-02 ENCOUNTER — Telehealth: Payer: Self-pay | Admitting: Oncology

## 2023-11-02 DIAGNOSIS — R627 Adult failure to thrive: Secondary | ICD-10-CM | POA: Diagnosis not present

## 2023-11-02 DIAGNOSIS — E162 Hypoglycemia, unspecified: Secondary | ICD-10-CM | POA: Diagnosis not present

## 2023-11-02 DIAGNOSIS — Z515 Encounter for palliative care: Secondary | ICD-10-CM | POA: Diagnosis not present

## 2023-11-02 DIAGNOSIS — R630 Anorexia: Secondary | ICD-10-CM | POA: Diagnosis not present

## 2023-11-02 DIAGNOSIS — E43 Unspecified severe protein-calorie malnutrition: Secondary | ICD-10-CM

## 2023-11-02 DIAGNOSIS — L8990 Pressure ulcer of unspecified site, unspecified stage: Secondary | ICD-10-CM | POA: Insufficient documentation

## 2023-11-02 MED ORDER — LORAZEPAM 0.5 MG PO TABS
0.5000 mg | ORAL_TABLET | ORAL | 0 refills | Status: AC | PRN
  Filled 2023-11-02: qty 18, 3d supply, fill #0

## 2023-11-02 NOTE — Care Management Important Message (Signed)
 Important Message  Patient Details  Name: HECTOR VENNE MRN: [id number] Date of Birth: [birth year]   Important Message Given:  Yes - Medicare IM     Tieisha Darden W, CMA 11/02/2023, 1:15 PM

## 2023-11-02 NOTE — Plan of Care (Signed)
  Problem: Education: Goal: Individualized Educational Video(s) Outcome: Progressing   Problem: Fluid Volume: Goal: Ability to maintain a balanced intake and output will improve Outcome: Progressing   Problem: Metabolic: Goal: Ability to maintain appropriate glucose levels will improve Outcome: Progressing   Problem: Tissue Perfusion: Goal: Adequacy of tissue perfusion will improve Outcome: Progressing

## 2023-11-02 NOTE — Telephone Encounter (Signed)
 Patient is hospitalized- appointments for today cancelled- please advise reschedule

## 2023-11-02 NOTE — Plan of Care (Signed)
  Problem: Pain Managment: Goal: General experience of comfort will improve and/or be controlled Outcome: Progressing   Problem: Safety: Goal: Ability to remain free from injury will improve Outcome: Progressing

## 2023-11-02 NOTE — TOC Transition Note (Signed)
 Transition of Care [facility]) - Discharge Note   Patient Details  Name: April [name] MRN: [id number] Date of Birth: January 31, 1941  [facility]) CM/SW Contact:  [place] L Annison Birchard, LCSW Phone Number: 11/02/2023, 11:48 AM   Clinical Narrative:     Secure chat received. DC orders are in. Patient discharging home with hospice services through [facility] Hospital Bed will be delivered today by 3pm. [facility] has been scheduled for transport time of 4pm. CSW contacted patient son, Falynn Ailey and he was notified. Med necessity form has been completed.   TOC signing off.   Final next level of care: Home w Hospice Care Barriers to Discharge: Barriers Resolved   Patient Goals and CMS Choice   CMS Medicare.gov Compare Post Acute Care list provided to:: Patient Choice offered to / list presented to : Patient [facility] ownership interest in [facility].provided to:: Patient    Discharge Placement                Patient to be transferred to facility by: [facility] Name of family member notified: Antonio Bedgood-Son Patient and family notified of of transfer: 11/02/23  Discharge Plan and Services Additional resources added to the After Visit Summary for                              [facility] Agency: Other - See comment [facility] [place]) Date [facility] Agency Contacted: 11/01/23 Time HH Agency Contacted: [id number] Representative spoke with at [facility] Agency: Hunter Seip  Social Drivers of Health (SDOH) Interventions SDOH Screenings   Food Insecurity: No Food Insecurity (10/30/2023)  Housing: Low Risk  (10/30/2023)  Transportation Needs: No Transportation Needs (10/30/2023)  Utilities: Not At Risk (10/30/2023)  Alcohol Screen: Low Risk  (07/21/2022)  Depression (PHQ2-9): Low Risk  (09/22/2023)  Recent Concern: Depression (PHQ2-9) - Medium Risk (08/16/2023)  Financial Resource Strain: Low Risk  (07/05/2023)   Received from [facility] Care  Physical Activity: Insufficiently Active  (07/21/2022)  Social Connections: Patient Unable To Answer (10/30/2023)  Stress: No Stress Concern Present (07/21/2022)  Tobacco Use: Medium Risk (10/30/2023)     Readmission Risk Interventions     No data to display

## 2023-11-03 NOTE — Discharge Summary (Signed)
 Physician Discharge Summary   Patient: April [name] MRN: [id number] DOB: 1940/05/12  Admit date:     10/30/2023  Discharge date: 11/02/2023  Discharge Physician: Cresencio Fairly   PCP: Salli Amato, MD   Recommendations at discharge:    Home with Hospice  Discharge Diagnoses: Principal Problem:   Failure to thrive in adult Active Problems:   Anal squamous cell carcinoma (HCC)   Right tonsillar squamous cell carcinoma (HCC)   Hypoglycemia   Thyroid  nodule   Type 2 diabetes mellitus without complications (HCC)   Essential hypertension   Hyperlipidemia due to type 2 diabetes mellitus (HCC)   Stage 3b chronic kidney disease (HCC)   Anemia of chronic disease   Protein-calorie malnutrition, severe   Hospice care patient   Anorexia   Pressure injury of skin  Hospital Course: April [name] is an 83 year old female with history of diverticulosis, 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  Vitals in the ED showed temperature of 97.4, respiration rate 20, heart rate 70, blood pressure 131/64, SpO2 100% on room air.  Serum sodium is 143, potassium 3.5, chloride 107, bicarb 26, BUN of 15, serum creatinine 1.19, eGFR 45, nonfasting blood glucose 85, WBC 3.1, hemoglobin 9.0, platelets of 112.  Initial CBG showed 61.  Per nursing documentation, patient received orange juice.  Lactic acid of 0.7.  Per nursing documentation, patient was drinking coffee with sugar and cream.  ED treatment: Ativan  1 mg IV one-time dose.  Assessment and Plan:   Failure to thrive in adult Likely multifactorial in setting of 2 squamous cell carcinoma (anus and right tonsil) with significant weight  loss.     Hypoglycemia Right tonsillar squamous cell carcinoma (HCC) Not a candidate for further chemotherapy   Anal squamous cell carcinoma (HCC) Further chemotherapy not an option given functional status currently   Thyroid  nodule Anemia of chronic disease No transfusions indicated   Stage 3b chronic kidney disease (HCC) Hyperlipidemia due to type 2 diabetes mellitus (HCC) Essential hypertension Type 2 diabetes mellitus without complications (HCC)  Being DCed home with Hospice         Consultants: Palliative care Disposition: Hospice care Diet recommendation:  Discharge Diet Orders (From admission, onward)     Start     Ordered   11/02/23 0000  Diet - low sodium heart healthy        11/02/23 1145           Carb modified diet DISCHARGE MEDICATION: Allergies as of 11/02/2023   No Known Allergies      Medication List     STOP taking these medications    aspirin 81 MG tablet   cephALEXin 500 MG capsule Commonly known as: KEFLEX   colchicine  0.6 MG tablet   cyanocobalamin  1000 MCG/ML injection Commonly known as: VITAMIN B12   dexamethasone  2 MG tablet Commonly known as: DECADRON    diphenoxylate -atropine  2.5-0.025 MG tablet Commonly known as: LOMOTIL    Eliquis  2.5 MG Tabs tablet Generic drug: apixaban    ergocalciferol  1.25 MG (50000 UT) capsule Commonly known as: VITAMIN D2   esomeprazole 40 MG capsule Commonly known as: NEXIUM   hydrochlorothiazide  12.5 MG capsule Commonly known as: MICROZIDE    lisinopril  20 MG tablet Commonly known  as: ZESTRIL    magic mouthwash (multi-ingredient) oral suspension   metFORMIN  500 MG tablet Commonly known as: GLUCOPHAGE    mirtazapine  15 MG tablet Commonly known as: REMERON    mirtazapine  7.5 MG tablet Commonly known as: REMERON    naloxone  4 MG/0.1ML Liqd nasal spray kit Commonly known as: NARCAN    nystatin  100000 UNIT/ML suspension Commonly known as: MYCOSTATIN    potassium chloride  10 MEQ  tablet Commonly known as: KLOR-CON    senna 8.6 MG tablet Commonly known as: SENOKOT   simvastatin  20 MG tablet Commonly known as: ZOCOR    sucralfate  1 GM/10ML suspension Commonly known as: Carafate    zinc  gluconate 50 MG tablet       TAKE these medications    amLODipine 10 MG tablet Commonly known as: NORVASC Take 10 mg by mouth daily.   carvedilol  3.125 MG tablet Commonly known as: COREG  Take 1 tablet by mouth 2 (two) times daily. cardio   lidocaine  2 % solution Commonly known as: XYLOCAINE  Use as directed 15 mLs in the mouth or throat as needed for mouth pain.   lisinopril -hydrochlorothiazide  20-25 MG tablet Commonly known as: ZESTORETIC  Take 1 tablet by mouth daily.   LORazepam  0.5 MG tablet Commonly known as: ATIVAN  Take 1 tablet (0.5 mg total) by mouth every 4 (four) hours as needed for up to 3 days for anxiety. May crush, mix with water  and give sublingually if needed.   oxyCODONE  5 MG immediate release tablet Commonly known as: Oxy IR/ROXICODONE  Take 1 tablet (5 mg total) by mouth every 4 (four) hours as needed for severe pain (pain score 7-10).   tiZANidine  4 MG tablet Commonly known as: ZANAFLEX  Take 1 tablet (4 mg total) by mouth at bedtime.        Follow-up Information     Salli Amato, MD Follow up.   Specialty: Internal Medicine Why: hospital follow up Contact information: [address] [place] [address] [phone]                Discharge Exam: Fredricka Weights   10/30/23 1011  Weight: 47.6 kg   General exam: Appears weak and fatigued Respiratory system: Diminished respiratory effort.  Normal work of breathing.   Cardiovascular system: S2, tachycardic, regular rhythm, no murmurs Gastrointestinal system:, Soft, NT/ND, normal bowel sounds Central nervous system: Lethargic.  Unable to assess orientation Extremities: Marked decreased power bilaterally Skin: No rashes, lesions or ulcers Psychiatry: Unable to  assess  Condition at discharge: poor  The results of significant diagnostics from this hospitalization (including imaging, microbiology, ancillary and laboratory) are listed below for reference.   Imaging Studies: CT ABDOMEN PELVIS W CONTRAST Result Date: 10/30/2023 CLINICAL DATA:  Concern for small bowel obstruction. EXAM: CT ABDOMEN AND PELVIS WITH CONTRAST TECHNIQUE: Multidetector CT imaging of the abdomen and pelvis was performed using the standard protocol following bolus administration of intravenous contrast. RADIATION DOSE REDUCTION: This exam was performed according to the departmental dose-optimization program which includes automated exposure control, adjustment of the mA and/or kV according to patient size and/or use of iterative reconstruction technique. CONTRAST:  75mL OMNIPAQUE  IOHEXOL  300 MG/ML  SOLN COMPARISON:  10/30/2023, 10/17/2023. FINDINGS: Lower chest: The heart is enlarged and coronary artery calcification is noted. There is calcification of mitral valve annulus. Atelectasis is present at the lung bases bilaterally. Hepatobiliary: No focal liver abnormality is seen. Status post cholecystectomy. No biliary dilatation. Pancreas: Unremarkable. No pancreatic ductal dilatation or surrounding inflammatory changes. Spleen: Normal size. A few scattered hypodensities are noted, statistically most likely representing  cysts or hemangiomas. Adrenals/Urinary Tract: The adrenal glands are within normal limits. The kidneys enhance symmetrically. Cysts are noted in the kidneys bilaterally. Additional subcentimeter hypodensities are present in the kidneys which are too small to further characterize. No renal calculus or hydronephrosis bilaterally. The bladder is unremarkable. Stomach/Bowel: The stomach is decompressed and otherwise within normal limits. No bowel obstruction, free air, or pneumatosis is seen. Scattered diverticula are present along the colon without evidence of diverticulitis. Appendix  is not seen. Pelvic floor laxity is noted. There is thickening of the walls of the rectum and anal region with perirectal fat stranding. Vascular/Lymphatic: Aortic atherosclerosis. No enlarged abdominal or pelvic lymph nodes. Reproductive: Uterus and bilateral adnexa are unremarkable. Other: No abdominopelvic ascites.  Mild anasarca is present. Musculoskeletal: Degenerative changes are noted in the thoracolumbar spine. No acute osseous abnormality is seen. IMPRESSION: 1. No evidence of small-bowel obstruction. 2. Thickening of the walls of the rectum and anorectal junction which may be associated with known mass. Superimposed proctitis cannot be excluded. 3. Mild anasarca. 4. Diverticulosis without diverticulitis. 5. Aortic atherosclerosis. Electronically Signed   By: Leita Birmingham M.D.   On: 10/30/2023 15:13   DG Abdomen 1 View Result Date: 10/30/2023 CLINICAL DATA:  evaluate for pna Evaluate for pna per ordering provider. Patients family member states patient lack of appetite. EXAM: PORTABLE CHEST and abdomen 1 VIEW COMPARISON:  Chest x-ray 02/18/2023, x-ray abdomen 10/30/2023, PET CT 04/05/2023 FINDINGS: Right chest wall Port-A-Cath with tip overlying the superior cavoatrial junction. The heart and mediastinal contours are unchanged. Atherosclerotic plaque. Severe mitral annular calcification. No focal consolidation. Chronic coarsened interstitial markings with no overt pulmonary edema. No pleural effusion. No pneumothorax. No acute osseous abnormality. Multiple loops of small bowel dilated with gas. Right upper quadrant surgical clips. Surgical changes overlie the right pelvis. IMPRESSION: 1. No acute cardiopulmonary abnormality. 2. Gaseous dilatation of multiple loops of small bowel. Finding could represent small bowel obstructions. 3. Aortic Atherosclerosis (ICD10-I70.0) including mitral annular calcification. Electronically Signed   By: Morgane  Naveau M.D.   On: 10/30/2023 11:03   DG Chest Portable 1  View Result Date: 10/30/2023 CLINICAL DATA:  evaluate for pna Evaluate for pna per ordering provider. Patients family member states patient lack of appetite. EXAM: PORTABLE CHEST and abdomen 1 VIEW COMPARISON:  Chest x-ray 02/18/2023, x-ray abdomen 10/30/2023, PET CT 04/05/2023 FINDINGS: Right chest wall Port-A-Cath with tip overlying the superior cavoatrial junction. The heart and mediastinal contours are unchanged. Atherosclerotic plaque. Severe mitral annular calcification. No focal consolidation. Chronic coarsened interstitial markings with no overt pulmonary edema. No pleural effusion. No pneumothorax. No acute osseous abnormality. Multiple loops of small bowel dilated with gas. Right upper quadrant surgical clips. Surgical changes overlie the right pelvis. IMPRESSION: 1. No acute cardiopulmonary abnormality. 2. Gaseous dilatation of multiple loops of small bowel. Finding could represent small bowel obstructions. 3. Aortic Atherosclerosis (ICD10-I70.0) including mitral annular calcification. Electronically Signed   By: Morgane  Naveau M.D.   On: 10/30/2023 11:03   NM PET Image Restage (PS) Skull Base to Thigh (F-18 FDG) Result Date: 10/20/2023 EXAM: PET AND CT SKULL BASE TO MID THIGH 10/17/2023 01:09:40 PM TECHNIQUE: None RADIOPHARMACEUTICAL: 6.36 mCi F-18 FDG Uptake time 60 minutes. Glucose level 86 mg/dl. PET imaging was acquired from the base of the skull to the mid thighs. Non-contrast enhanced computed tomography was obtained for attenuation correction and anatomic localization. COMPARISON: PET-T 04/05/23 CLINICAL HISTORY: Response to therapy, head and neck cancer. Oropharyngeal carcinoma. History of anal SCC and RT  tonsillar SCC. Peripheral venous insufficiency. Patient arrived in wheelchair and had a very difficult time transitioning to chair/table. Very quiet. Not very responsive. Lacking energy. Fell 10/04/23 and hit head on left scalp. Went to ED. FINDINGS: HEAD AND NECK: Persistent tracer avid  tumor within the right oropharynx centered in the right tonsillar region. Tumor measures approximately 4.6 x 3.6 x 4.0 cm (axial image 10). The corresponding SUV max is equal to 9.0. On the previous exam, tumor measured 3.2 x 3.2 x 3.0 cm with SUV max of 19.3. Persistence of right level 2 tracer avid lymph nodes. SUV max within the right cervical level 2 station has an SUV max of 5.6. On the previous exam, lymph node in the right level 2 station had an SUV max of 9.7. No additional tracer avid cervical lymph nodes bilaterally. Tracer avid nodule in the posterior aspect of the right lobe of thyroid  gland is again noted with SUV max of 3.0 (axial image 12). On the previous exam, the SUV max was 6.6. Resolution of tracer avid nodule in the left lobe of thyroid  gland. CHEST: No tracer avid mediastinal or hilar lymph nodes. Nonspecific subpleural nodule in the posterior right upper lobe measures 0.9 cm with SUV max of 1.4 (image 27 of the axial images). This is new from the prior exam. Cardiac enlargement, aortic atherosclerosis and coronary artery calcifications. ABDOMEN AND PELVIS: No abnormal tracer uptake identified within the liver, pancreas, spleen or adrenal glands. No hypermetabolic abdominal and pelvic lymph nodes. Extensive aortic atherosclerotic calcifications. Status post cholecystectomy. No ascites or focal fluid collections. Previous tracer activity within the anorectal mass has an SUV max of 3.4 on today's study (axial image 114), previously 17.4. BONES AND SOFT TISSUE: No signs of tracer avid bone metastases. IMPRESSION: 1. Persistent tracer avid tumor within the right oropharynx centered in the right tonsillar region, measuring approximately 4.6 x 3.6 x 4.0 cm with SUV max of 9.0, increased in size , but with decrease in SUV max compared to the prior study. 2. Persistence of right level 2 tracer avid lymph nodes with interval decrease in degree of tracer uptake compared with the previous exam. 3. No new  sites of disease identified. 4. Interval decrease in degree of tracer uptake associated with known anorectal mass. 5. Tracer avid nodule in the posterior aspect of the right lobe of thyroid  gland with SUV max of 3.0, decreased compared to the prior study. Resolution of tracer avid nodule in the left lobe of thyroid  gland. 6. No signs of tracer avid bone metastases. Electronically signed by: Waddell Calk MD 10/20/2023 06:22 AM EDT RP Workstation: [id number]    Microbiology: Results for orders placed or performed in visit on 07/27/23  Urine Culture     Status: None   Collection Time: 07/27/23  2:30 PM   Specimen: Urine, Clean Catch  Result Value Ref Range Status   Specimen Description   Final    URINE, CLEAN CATCH Performed at [facility], [address], [place], [place] [address]    Special Requests   Final    NONE Performed at [facility], [address] [place], [place] [address]    Culture   Final    NO GROWTH Performed at [facility], [address], [place], [place] [address]    Report Status 07/28/2023 FINAL  Final    Labs: CBC: Recent Labs  Lab 10/30/23 1142 10/31/23 0905  WBC 3.1* 2.9*  HGB 9.0* 7.2*  HCT 30.6*  22.9*  MCV 102.7* 94.2  PLT 112* 112*   Basic Metabolic Panel: Recent Labs  Lab 10/30/23 1142 10/31/23 0905  NA 143 143  K 3.5 2.7*  CL 107 108  CO2 26 28  GLUCOSE 85 107*  BUN 15 13  CREATININE 1.19* 0.95  CALCIUM 8.8* 8.3*   Liver Function Tests: Recent Labs  Lab 10/30/23 1142  AST 19  ALT 7  ALKPHOS 52  BILITOT 1.2  PROT 6.0*  ALBUMIN 2.6*   CBG: Recent Labs  Lab 10/30/23 2034 10/31/23 0038 10/31/23 0356 10/31/23 1204 10/31/23 1601  GLUCAP 101* 105* 105* 97 109*    Discharge time spent: greater than 30 minutes.  Signed: Cresencio Fairly, MD [facility] 11/03/2023

## 2023-11-04 ENCOUNTER — Telehealth: Payer: Self-pay

## 2023-11-09 NOTE — Telephone Encounter (Signed)
 Copied from CRM #[id number]. Topic: Clinical - Home Health Verbal Orders >> Nov 04, 2023 10:07 AM Carlyon D wrote: Caller/Agency: [facility]  Callback Number: [phone] Service Requested: Physical Therapy Frequency: 3 physical therapy visits  Any new concerns about the patient? No

## 2023-11-09 NOTE — Telephone Encounter (Signed)
 No longer a patient at this practice.

## 2023-11-09 DEATH — deceased

## 2023-11-17 ENCOUNTER — Encounter: Admitting: Family Medicine

## 2023-11-21 ENCOUNTER — Telehealth: Payer: Self-pay | Admitting: *Deleted

## 2023-11-21 NOTE — Telephone Encounter (Signed)
 The death certificate did not come back with a date put on it.  They need it quickly so that they could take care of this patient funeral

## 2023-12-06 ENCOUNTER — Telehealth: Payer: Self-pay

## 2023-12-06 NOTE — Telephone Encounter (Signed)
 Please review, it's regarding billing

## 2023-12-06 NOTE — Telephone Encounter (Signed)
 Copied from CRM [id number]. Topic: General - Other >> Dec 06, 2023  1:53 PM Avram MATSU wrote: Reason for CRM: Colie is calling to verify information about patient primary care for billing purposes. [phone] [place] Coordinator    ----------------------------------------------------------------------- From previous Reason for Contact - Deceased Patient: Name of caller:   Date of death:    Name of funeral home:   Phone number of funeral home:   Provider that needs to sign form:   Timeline for signing:
